# Patient Record
Sex: Male | Born: 1940
Health system: Southern US, Community
[De-identification: ages and names within clinical notes are randomized; demographics above are authoritative.]

## PROBLEM LIST (undated history)

## (undated) DIAGNOSIS — Z96659 Presence of unspecified artificial knee joint: Secondary | ICD-10-CM

## (undated) DIAGNOSIS — M1990 Unspecified osteoarthritis, unspecified site: Secondary | ICD-10-CM

## (undated) DIAGNOSIS — K5900 Constipation, unspecified: Secondary | ICD-10-CM

## (undated) DIAGNOSIS — A159 Respiratory tuberculosis unspecified: Secondary | ICD-10-CM

## (undated) DIAGNOSIS — S0990XA Unspecified injury of head, initial encounter: Secondary | ICD-10-CM

## (undated) DIAGNOSIS — N400 Enlarged prostate without lower urinary tract symptoms: Secondary | ICD-10-CM

## (undated) DIAGNOSIS — G709 Myoneural disorder, unspecified: Secondary | ICD-10-CM

## (undated) DIAGNOSIS — E119 Type 2 diabetes mellitus without complications: Secondary | ICD-10-CM

## (undated) DIAGNOSIS — R011 Cardiac murmur, unspecified: Secondary | ICD-10-CM

## (undated) DIAGNOSIS — A4901 Methicillin susceptible Staphylococcus aureus infection, unspecified site: Secondary | ICD-10-CM

## (undated) DIAGNOSIS — I10 Essential (primary) hypertension: Secondary | ICD-10-CM

## (undated) DIAGNOSIS — T8149XA Infection following a procedure, other surgical site, initial encounter: Secondary | ICD-10-CM

## (undated) DIAGNOSIS — T8459XA Infection and inflammatory reaction due to other internal joint prosthesis, initial encounter: Secondary | ICD-10-CM

## (undated) DIAGNOSIS — K5903 Drug induced constipation: Secondary | ICD-10-CM

## (undated) DIAGNOSIS — K219 Gastro-esophageal reflux disease without esophagitis: Secondary | ICD-10-CM

## (undated) DIAGNOSIS — E039 Hypothyroidism, unspecified: Secondary | ICD-10-CM

## (undated) DIAGNOSIS — S069X9A Unspecified intracranial injury with loss of consciousness of unspecified duration, initial encounter: Secondary | ICD-10-CM

## (undated) DIAGNOSIS — T402X5A Adverse effect of other opioids, initial encounter: Secondary | ICD-10-CM

## (undated) HISTORY — PX: CATARACT EXTRACTION: SUR2

## (undated) HISTORY — PX: REFRACTIVE SURGERY: SHX103

## (undated) HISTORY — DX: Infection following a procedure, other surgical site, initial encounter: T81.49XA

## (undated) HISTORY — PX: COLONOSCOPY: SHX174

## (undated) HISTORY — DX: Methicillin susceptible Staphylococcus aureus infection, unspecified site: A49.01

## (undated) HISTORY — PX: CARPAL TUNNEL RELEASE: SHX101

---

## 1982-11-20 HISTORY — PX: OTHER SURGICAL HISTORY: SHX169

## 2007-11-21 HISTORY — PX: CARPAL TUNNEL RELEASE: SHX101

## 2009-09-20 HISTORY — PX: PARTIAL KNEE ARTHROPLASTY: SHX2174

## 2010-10-20 HISTORY — PX: BACK SURGERY: SHX140

## 2011-11-24 DIAGNOSIS — E119 Type 2 diabetes mellitus without complications: Secondary | ICD-10-CM | POA: Diagnosis not present

## 2011-11-24 DIAGNOSIS — E109 Type 1 diabetes mellitus without complications: Secondary | ICD-10-CM | POA: Diagnosis not present

## 2011-11-24 DIAGNOSIS — I10 Essential (primary) hypertension: Secondary | ICD-10-CM | POA: Diagnosis not present

## 2011-11-24 DIAGNOSIS — E039 Hypothyroidism, unspecified: Secondary | ICD-10-CM | POA: Diagnosis not present

## 2011-11-24 DIAGNOSIS — E7800 Pure hypercholesterolemia, unspecified: Secondary | ICD-10-CM | POA: Diagnosis not present

## 2011-11-24 DIAGNOSIS — R7611 Nonspecific reaction to tuberculin skin test without active tuberculosis: Secondary | ICD-10-CM | POA: Diagnosis not present

## 2011-11-24 DIAGNOSIS — Z125 Encounter for screening for malignant neoplasm of prostate: Secondary | ICD-10-CM | POA: Diagnosis not present

## 2012-01-09 DIAGNOSIS — M5137 Other intervertebral disc degeneration, lumbosacral region: Secondary | ICD-10-CM | POA: Diagnosis not present

## 2012-01-09 DIAGNOSIS — M48061 Spinal stenosis, lumbar region without neurogenic claudication: Secondary | ICD-10-CM | POA: Diagnosis not present

## 2012-01-17 DIAGNOSIS — M48061 Spinal stenosis, lumbar region without neurogenic claudication: Secondary | ICD-10-CM | POA: Diagnosis not present

## 2012-01-17 DIAGNOSIS — M5137 Other intervertebral disc degeneration, lumbosacral region: Secondary | ICD-10-CM | POA: Diagnosis not present

## 2012-03-11 DIAGNOSIS — M48061 Spinal stenosis, lumbar region without neurogenic claudication: Secondary | ICD-10-CM | POA: Diagnosis not present

## 2012-03-11 DIAGNOSIS — M5137 Other intervertebral disc degeneration, lumbosacral region: Secondary | ICD-10-CM | POA: Diagnosis not present

## 2012-05-22 DIAGNOSIS — E039 Hypothyroidism, unspecified: Secondary | ICD-10-CM | POA: Diagnosis not present

## 2012-05-22 DIAGNOSIS — E782 Mixed hyperlipidemia: Secondary | ICD-10-CM | POA: Diagnosis not present

## 2012-05-22 DIAGNOSIS — I10 Essential (primary) hypertension: Secondary | ICD-10-CM | POA: Diagnosis not present

## 2012-06-13 DIAGNOSIS — Z049 Encounter for examination and observation for unspecified reason: Secondary | ICD-10-CM | POA: Diagnosis not present

## 2012-06-17 DIAGNOSIS — M159 Polyosteoarthritis, unspecified: Secondary | ICD-10-CM | POA: Diagnosis not present

## 2012-06-17 DIAGNOSIS — G619 Inflammatory polyneuropathy, unspecified: Secondary | ICD-10-CM | POA: Diagnosis not present

## 2012-06-17 DIAGNOSIS — G609 Hereditary and idiopathic neuropathy, unspecified: Secondary | ICD-10-CM | POA: Diagnosis not present

## 2012-06-17 DIAGNOSIS — Z79899 Other long term (current) drug therapy: Secondary | ICD-10-CM | POA: Diagnosis not present

## 2012-07-16 DIAGNOSIS — M545 Low back pain: Secondary | ICD-10-CM | POA: Diagnosis not present

## 2012-07-16 DIAGNOSIS — M19049 Primary osteoarthritis, unspecified hand: Secondary | ICD-10-CM | POA: Diagnosis not present

## 2012-07-16 DIAGNOSIS — M5137 Other intervertebral disc degeneration, lumbosacral region: Secondary | ICD-10-CM | POA: Diagnosis not present

## 2012-07-31 DIAGNOSIS — M48061 Spinal stenosis, lumbar region without neurogenic claudication: Secondary | ICD-10-CM | POA: Diagnosis not present

## 2012-10-15 DIAGNOSIS — G619 Inflammatory polyneuropathy, unspecified: Secondary | ICD-10-CM | POA: Diagnosis not present

## 2012-10-15 DIAGNOSIS — M1990 Unspecified osteoarthritis, unspecified site: Secondary | ICD-10-CM | POA: Diagnosis not present

## 2012-10-15 DIAGNOSIS — R5381 Other malaise: Secondary | ICD-10-CM | POA: Diagnosis not present

## 2012-10-15 DIAGNOSIS — E782 Mixed hyperlipidemia: Secondary | ICD-10-CM | POA: Diagnosis not present

## 2012-10-15 DIAGNOSIS — M159 Polyosteoarthritis, unspecified: Secondary | ICD-10-CM | POA: Diagnosis not present

## 2012-10-15 DIAGNOSIS — G622 Polyneuropathy due to other toxic agents: Secondary | ICD-10-CM | POA: Diagnosis not present

## 2012-10-15 DIAGNOSIS — I10 Essential (primary) hypertension: Secondary | ICD-10-CM | POA: Diagnosis not present

## 2012-10-23 DIAGNOSIS — Z79899 Other long term (current) drug therapy: Secondary | ICD-10-CM | POA: Diagnosis not present

## 2012-11-26 DIAGNOSIS — N320 Bladder-neck obstruction: Secondary | ICD-10-CM | POA: Diagnosis not present

## 2013-02-14 DIAGNOSIS — I10 Essential (primary) hypertension: Secondary | ICD-10-CM | POA: Diagnosis not present

## 2013-02-14 DIAGNOSIS — E782 Mixed hyperlipidemia: Secondary | ICD-10-CM | POA: Diagnosis not present

## 2013-02-18 DIAGNOSIS — I10 Essential (primary) hypertension: Secondary | ICD-10-CM | POA: Diagnosis not present

## 2013-02-18 DIAGNOSIS — E039 Hypothyroidism, unspecified: Secondary | ICD-10-CM | POA: Diagnosis not present

## 2013-02-18 DIAGNOSIS — I739 Peripheral vascular disease, unspecified: Secondary | ICD-10-CM | POA: Diagnosis not present

## 2013-03-10 DIAGNOSIS — H47329 Drusen of optic disc, unspecified eye: Secondary | ICD-10-CM | POA: Diagnosis not present

## 2013-03-10 DIAGNOSIS — H2510 Age-related nuclear cataract, unspecified eye: Secondary | ICD-10-CM | POA: Diagnosis not present

## 2013-03-11 DIAGNOSIS — R351 Nocturia: Secondary | ICD-10-CM | POA: Diagnosis not present

## 2013-03-11 DIAGNOSIS — N400 Enlarged prostate without lower urinary tract symptoms: Secondary | ICD-10-CM | POA: Diagnosis not present

## 2013-03-11 DIAGNOSIS — N529 Male erectile dysfunction, unspecified: Secondary | ICD-10-CM | POA: Diagnosis not present

## 2013-04-22 DIAGNOSIS — G589 Mononeuropathy, unspecified: Secondary | ICD-10-CM | POA: Diagnosis not present

## 2013-04-22 DIAGNOSIS — M19049 Primary osteoarthritis, unspecified hand: Secondary | ICD-10-CM | POA: Diagnosis not present

## 2013-04-22 DIAGNOSIS — G5600 Carpal tunnel syndrome, unspecified upper limb: Secondary | ICD-10-CM | POA: Diagnosis not present

## 2013-05-12 DIAGNOSIS — G5600 Carpal tunnel syndrome, unspecified upper limb: Secondary | ICD-10-CM | POA: Diagnosis not present

## 2013-05-20 DIAGNOSIS — E1142 Type 2 diabetes mellitus with diabetic polyneuropathy: Secondary | ICD-10-CM | POA: Diagnosis not present

## 2013-05-20 DIAGNOSIS — G5600 Carpal tunnel syndrome, unspecified upper limb: Secondary | ICD-10-CM | POA: Diagnosis not present

## 2013-05-20 DIAGNOSIS — M5412 Radiculopathy, cervical region: Secondary | ICD-10-CM | POA: Diagnosis not present

## 2013-05-20 DIAGNOSIS — E782 Mixed hyperlipidemia: Secondary | ICD-10-CM | POA: Diagnosis not present

## 2013-05-20 DIAGNOSIS — IMO0001 Reserved for inherently not codable concepts without codable children: Secondary | ICD-10-CM | POA: Diagnosis not present

## 2013-05-21 DIAGNOSIS — E782 Mixed hyperlipidemia: Secondary | ICD-10-CM | POA: Diagnosis not present

## 2013-05-21 DIAGNOSIS — E039 Hypothyroidism, unspecified: Secondary | ICD-10-CM | POA: Diagnosis not present

## 2013-05-21 DIAGNOSIS — I10 Essential (primary) hypertension: Secondary | ICD-10-CM | POA: Diagnosis not present

## 2013-06-30 DIAGNOSIS — J019 Acute sinusitis, unspecified: Secondary | ICD-10-CM | POA: Diagnosis not present

## 2013-06-30 DIAGNOSIS — G5600 Carpal tunnel syndrome, unspecified upper limb: Secondary | ICD-10-CM | POA: Diagnosis not present

## 2013-08-21 DIAGNOSIS — Z01818 Encounter for other preprocedural examination: Secondary | ICD-10-CM | POA: Diagnosis not present

## 2013-08-22 DIAGNOSIS — G5600 Carpal tunnel syndrome, unspecified upper limb: Secondary | ICD-10-CM | POA: Diagnosis not present

## 2013-09-09 DIAGNOSIS — R5381 Other malaise: Secondary | ICD-10-CM | POA: Diagnosis not present

## 2013-09-09 DIAGNOSIS — N529 Male erectile dysfunction, unspecified: Secondary | ICD-10-CM | POA: Diagnosis not present

## 2013-09-09 DIAGNOSIS — E782 Mixed hyperlipidemia: Secondary | ICD-10-CM | POA: Diagnosis not present

## 2013-09-09 DIAGNOSIS — E039 Hypothyroidism, unspecified: Secondary | ICD-10-CM | POA: Diagnosis not present

## 2013-09-09 DIAGNOSIS — E559 Vitamin D deficiency, unspecified: Secondary | ICD-10-CM | POA: Diagnosis not present

## 2013-09-09 DIAGNOSIS — I10 Essential (primary) hypertension: Secondary | ICD-10-CM | POA: Diagnosis not present

## 2013-09-10 DIAGNOSIS — N453 Epididymo-orchitis: Secondary | ICD-10-CM | POA: Diagnosis not present

## 2013-09-10 DIAGNOSIS — E119 Type 2 diabetes mellitus without complications: Secondary | ICD-10-CM | POA: Diagnosis not present

## 2013-09-10 DIAGNOSIS — N320 Bladder-neck obstruction: Secondary | ICD-10-CM | POA: Diagnosis not present

## 2013-09-10 DIAGNOSIS — N433 Hydrocele, unspecified: Secondary | ICD-10-CM | POA: Diagnosis not present

## 2013-10-01 DIAGNOSIS — E039 Hypothyroidism, unspecified: Secondary | ICD-10-CM | POA: Diagnosis not present

## 2013-10-06 DIAGNOSIS — J3481 Nasal mucositis (ulcerative): Secondary | ICD-10-CM | POA: Diagnosis not present

## 2013-11-06 DIAGNOSIS — M1990 Unspecified osteoarthritis, unspecified site: Secondary | ICD-10-CM | POA: Diagnosis not present

## 2013-11-06 DIAGNOSIS — G5600 Carpal tunnel syndrome, unspecified upper limb: Secondary | ICD-10-CM | POA: Diagnosis not present

## 2013-11-06 DIAGNOSIS — G589 Mononeuropathy, unspecified: Secondary | ICD-10-CM | POA: Diagnosis not present

## 2014-02-19 DIAGNOSIS — L570 Actinic keratosis: Secondary | ICD-10-CM | POA: Diagnosis not present

## 2014-03-11 DIAGNOSIS — N320 Bladder-neck obstruction: Secondary | ICD-10-CM | POA: Diagnosis not present

## 2014-03-19 DIAGNOSIS — M5137 Other intervertebral disc degeneration, lumbosacral region: Secondary | ICD-10-CM | POA: Diagnosis not present

## 2014-03-24 DIAGNOSIS — E559 Vitamin D deficiency, unspecified: Secondary | ICD-10-CM | POA: Diagnosis not present

## 2014-03-24 DIAGNOSIS — E1065 Type 1 diabetes mellitus with hyperglycemia: Secondary | ICD-10-CM | POA: Diagnosis not present

## 2014-03-24 DIAGNOSIS — R5381 Other malaise: Secondary | ICD-10-CM | POA: Diagnosis not present

## 2014-03-24 DIAGNOSIS — R5383 Other fatigue: Secondary | ICD-10-CM | POA: Diagnosis not present

## 2014-03-24 DIAGNOSIS — IMO0002 Reserved for concepts with insufficient information to code with codable children: Secondary | ICD-10-CM | POA: Diagnosis not present

## 2014-03-24 DIAGNOSIS — M5137 Other intervertebral disc degeneration, lumbosacral region: Secondary | ICD-10-CM | POA: Diagnosis not present

## 2014-03-30 DIAGNOSIS — M6281 Muscle weakness (generalized): Secondary | ICD-10-CM | POA: Diagnosis not present

## 2014-03-30 DIAGNOSIS — M5137 Other intervertebral disc degeneration, lumbosacral region: Secondary | ICD-10-CM | POA: Diagnosis not present

## 2014-03-31 DIAGNOSIS — R5381 Other malaise: Secondary | ICD-10-CM | POA: Diagnosis not present

## 2014-03-31 DIAGNOSIS — I10 Essential (primary) hypertension: Secondary | ICD-10-CM | POA: Diagnosis not present

## 2014-03-31 DIAGNOSIS — E782 Mixed hyperlipidemia: Secondary | ICD-10-CM | POA: Diagnosis not present

## 2014-03-31 DIAGNOSIS — E1065 Type 1 diabetes mellitus with hyperglycemia: Secondary | ICD-10-CM | POA: Diagnosis not present

## 2014-03-31 DIAGNOSIS — I739 Peripheral vascular disease, unspecified: Secondary | ICD-10-CM | POA: Diagnosis not present

## 2014-03-31 DIAGNOSIS — R5383 Other fatigue: Secondary | ICD-10-CM | POA: Diagnosis not present

## 2014-03-31 DIAGNOSIS — IMO0002 Reserved for concepts with insufficient information to code with codable children: Secondary | ICD-10-CM | POA: Diagnosis not present

## 2014-04-01 DIAGNOSIS — M5137 Other intervertebral disc degeneration, lumbosacral region: Secondary | ICD-10-CM | POA: Diagnosis not present

## 2014-04-01 DIAGNOSIS — M6281 Muscle weakness (generalized): Secondary | ICD-10-CM | POA: Diagnosis not present

## 2014-04-06 DIAGNOSIS — M5137 Other intervertebral disc degeneration, lumbosacral region: Secondary | ICD-10-CM | POA: Diagnosis not present

## 2014-04-06 DIAGNOSIS — M6281 Muscle weakness (generalized): Secondary | ICD-10-CM | POA: Diagnosis not present

## 2014-04-08 DIAGNOSIS — M5137 Other intervertebral disc degeneration, lumbosacral region: Secondary | ICD-10-CM | POA: Diagnosis not present

## 2014-04-08 DIAGNOSIS — M6281 Muscle weakness (generalized): Secondary | ICD-10-CM | POA: Diagnosis not present

## 2014-04-15 DIAGNOSIS — H2510 Age-related nuclear cataract, unspecified eye: Secondary | ICD-10-CM | POA: Diagnosis not present

## 2014-04-15 DIAGNOSIS — H47329 Drusen of optic disc, unspecified eye: Secondary | ICD-10-CM | POA: Diagnosis not present

## 2014-04-15 DIAGNOSIS — M6281 Muscle weakness (generalized): Secondary | ICD-10-CM | POA: Diagnosis not present

## 2014-04-15 DIAGNOSIS — M5137 Other intervertebral disc degeneration, lumbosacral region: Secondary | ICD-10-CM | POA: Diagnosis not present

## 2014-04-16 DIAGNOSIS — M5137 Other intervertebral disc degeneration, lumbosacral region: Secondary | ICD-10-CM | POA: Diagnosis not present

## 2014-04-16 DIAGNOSIS — M6281 Muscle weakness (generalized): Secondary | ICD-10-CM | POA: Diagnosis not present

## 2014-04-20 DIAGNOSIS — M5137 Other intervertebral disc degeneration, lumbosacral region: Secondary | ICD-10-CM | POA: Diagnosis not present

## 2014-04-20 DIAGNOSIS — M6281 Muscle weakness (generalized): Secondary | ICD-10-CM | POA: Diagnosis not present

## 2014-04-22 DIAGNOSIS — N320 Bladder-neck obstruction: Secondary | ICD-10-CM | POA: Diagnosis not present

## 2014-04-22 DIAGNOSIS — N400 Enlarged prostate without lower urinary tract symptoms: Secondary | ICD-10-CM | POA: Diagnosis not present

## 2014-04-22 DIAGNOSIS — E119 Type 2 diabetes mellitus without complications: Secondary | ICD-10-CM | POA: Diagnosis not present

## 2014-04-22 DIAGNOSIS — N433 Hydrocele, unspecified: Secondary | ICD-10-CM | POA: Diagnosis not present

## 2014-04-22 DIAGNOSIS — M5137 Other intervertebral disc degeneration, lumbosacral region: Secondary | ICD-10-CM | POA: Diagnosis not present

## 2014-04-22 DIAGNOSIS — M6281 Muscle weakness (generalized): Secondary | ICD-10-CM | POA: Diagnosis not present

## 2014-05-06 DIAGNOSIS — I10 Essential (primary) hypertension: Secondary | ICD-10-CM | POA: Diagnosis not present

## 2014-05-06 DIAGNOSIS — G589 Mononeuropathy, unspecified: Secondary | ICD-10-CM | POA: Diagnosis not present

## 2014-05-06 DIAGNOSIS — E039 Hypothyroidism, unspecified: Secondary | ICD-10-CM | POA: Diagnosis not present

## 2014-05-06 DIAGNOSIS — IMO0001 Reserved for inherently not codable concepts without codable children: Secondary | ICD-10-CM | POA: Diagnosis not present

## 2014-05-06 DIAGNOSIS — G5600 Carpal tunnel syndrome, unspecified upper limb: Secondary | ICD-10-CM | POA: Diagnosis not present

## 2014-05-06 DIAGNOSIS — M1990 Unspecified osteoarthritis, unspecified site: Secondary | ICD-10-CM | POA: Diagnosis not present

## 2014-05-06 DIAGNOSIS — E782 Mixed hyperlipidemia: Secondary | ICD-10-CM | POA: Diagnosis not present

## 2014-05-07 DIAGNOSIS — M1710 Unilateral primary osteoarthritis, unspecified knee: Secondary | ICD-10-CM | POA: Diagnosis not present

## 2014-07-22 DIAGNOSIS — N529 Male erectile dysfunction, unspecified: Secondary | ICD-10-CM | POA: Diagnosis not present

## 2014-07-22 DIAGNOSIS — I10 Essential (primary) hypertension: Secondary | ICD-10-CM | POA: Diagnosis not present

## 2014-07-22 DIAGNOSIS — E162 Hypoglycemia, unspecified: Secondary | ICD-10-CM | POA: Diagnosis not present

## 2014-07-22 DIAGNOSIS — IMO0001 Reserved for inherently not codable concepts without codable children: Secondary | ICD-10-CM | POA: Diagnosis not present

## 2014-07-22 DIAGNOSIS — E039 Hypothyroidism, unspecified: Secondary | ICD-10-CM | POA: Diagnosis not present

## 2014-07-22 DIAGNOSIS — E559 Vitamin D deficiency, unspecified: Secondary | ICD-10-CM | POA: Diagnosis not present

## 2014-07-22 DIAGNOSIS — E782 Mixed hyperlipidemia: Secondary | ICD-10-CM | POA: Diagnosis not present

## 2014-11-24 DIAGNOSIS — E780 Pure hypercholesterolemia: Secondary | ICD-10-CM | POA: Diagnosis not present

## 2014-11-24 DIAGNOSIS — J342 Deviated nasal septum: Secondary | ICD-10-CM | POA: Diagnosis not present

## 2014-11-24 DIAGNOSIS — N401 Enlarged prostate with lower urinary tract symptoms: Secondary | ICD-10-CM | POA: Diagnosis not present

## 2014-11-24 DIAGNOSIS — E1140 Type 2 diabetes mellitus with diabetic neuropathy, unspecified: Secondary | ICD-10-CM | POA: Diagnosis not present

## 2014-11-24 DIAGNOSIS — Z79899 Other long term (current) drug therapy: Secondary | ICD-10-CM | POA: Diagnosis not present

## 2014-11-24 DIAGNOSIS — M159 Polyosteoarthritis, unspecified: Secondary | ICD-10-CM | POA: Diagnosis not present

## 2014-11-24 DIAGNOSIS — E039 Hypothyroidism, unspecified: Secondary | ICD-10-CM | POA: Diagnosis not present

## 2014-11-24 DIAGNOSIS — R609 Edema, unspecified: Secondary | ICD-10-CM | POA: Diagnosis not present

## 2014-12-23 DIAGNOSIS — M5416 Radiculopathy, lumbar region: Secondary | ICD-10-CM | POA: Diagnosis not present

## 2014-12-23 DIAGNOSIS — M179 Osteoarthritis of knee, unspecified: Secondary | ICD-10-CM | POA: Diagnosis not present

## 2014-12-23 DIAGNOSIS — Z9181 History of falling: Secondary | ICD-10-CM | POA: Diagnosis not present

## 2014-12-23 DIAGNOSIS — Z1389 Encounter for screening for other disorder: Secondary | ICD-10-CM | POA: Diagnosis not present

## 2015-01-11 DIAGNOSIS — H2513 Age-related nuclear cataract, bilateral: Secondary | ICD-10-CM | POA: Diagnosis not present

## 2015-01-11 DIAGNOSIS — E119 Type 2 diabetes mellitus without complications: Secondary | ICD-10-CM | POA: Diagnosis not present

## 2015-01-11 DIAGNOSIS — H47323 Drusen of optic disc, bilateral: Secondary | ICD-10-CM | POA: Diagnosis not present

## 2015-01-13 DIAGNOSIS — M5416 Radiculopathy, lumbar region: Secondary | ICD-10-CM | POA: Diagnosis not present

## 2015-01-13 DIAGNOSIS — Z6828 Body mass index (BMI) 28.0-28.9, adult: Secondary | ICD-10-CM | POA: Diagnosis not present

## 2015-01-15 DIAGNOSIS — M5416 Radiculopathy, lumbar region: Secondary | ICD-10-CM | POA: Diagnosis not present

## 2015-02-17 DIAGNOSIS — M5126 Other intervertebral disc displacement, lumbar region: Secondary | ICD-10-CM | POA: Diagnosis not present

## 2015-02-17 DIAGNOSIS — M4807 Spinal stenosis, lumbosacral region: Secondary | ICD-10-CM | POA: Diagnosis not present

## 2015-02-17 DIAGNOSIS — M4806 Spinal stenosis, lumbar region: Secondary | ICD-10-CM | POA: Diagnosis not present

## 2015-02-17 DIAGNOSIS — M5127 Other intervertebral disc displacement, lumbosacral region: Secondary | ICD-10-CM | POA: Diagnosis not present

## 2015-02-17 DIAGNOSIS — M5416 Radiculopathy, lumbar region: Secondary | ICD-10-CM | POA: Diagnosis not present

## 2015-02-23 DIAGNOSIS — Z6829 Body mass index (BMI) 29.0-29.9, adult: Secondary | ICD-10-CM | POA: Diagnosis not present

## 2015-02-23 DIAGNOSIS — E871 Hypo-osmolality and hyponatremia: Secondary | ICD-10-CM | POA: Diagnosis not present

## 2015-02-23 DIAGNOSIS — E1140 Type 2 diabetes mellitus with diabetic neuropathy, unspecified: Secondary | ICD-10-CM | POA: Diagnosis not present

## 2015-02-23 DIAGNOSIS — E780 Pure hypercholesterolemia: Secondary | ICD-10-CM | POA: Diagnosis not present

## 2015-02-23 DIAGNOSIS — M159 Polyosteoarthritis, unspecified: Secondary | ICD-10-CM | POA: Diagnosis not present

## 2015-03-03 DIAGNOSIS — M5416 Radiculopathy, lumbar region: Secondary | ICD-10-CM | POA: Diagnosis not present

## 2015-03-15 DIAGNOSIS — E1165 Type 2 diabetes mellitus with hyperglycemia: Secondary | ICD-10-CM | POA: Diagnosis not present

## 2015-03-15 DIAGNOSIS — M4806 Spinal stenosis, lumbar region: Secondary | ICD-10-CM | POA: Diagnosis not present

## 2015-03-15 DIAGNOSIS — M5416 Radiculopathy, lumbar region: Secondary | ICD-10-CM | POA: Diagnosis not present

## 2015-03-16 DIAGNOSIS — M4806 Spinal stenosis, lumbar region: Secondary | ICD-10-CM | POA: Diagnosis not present

## 2015-03-16 DIAGNOSIS — M5416 Radiculopathy, lumbar region: Secondary | ICD-10-CM | POA: Diagnosis not present

## 2015-03-25 DIAGNOSIS — E871 Hypo-osmolality and hyponatremia: Secondary | ICD-10-CM | POA: Diagnosis not present

## 2015-03-25 DIAGNOSIS — Z125 Encounter for screening for malignant neoplasm of prostate: Secondary | ICD-10-CM | POA: Diagnosis not present

## 2015-03-25 DIAGNOSIS — E1140 Type 2 diabetes mellitus with diabetic neuropathy, unspecified: Secondary | ICD-10-CM | POA: Diagnosis not present

## 2015-03-25 DIAGNOSIS — R609 Edema, unspecified: Secondary | ICD-10-CM | POA: Diagnosis not present

## 2015-03-25 DIAGNOSIS — E780 Pure hypercholesterolemia: Secondary | ICD-10-CM | POA: Diagnosis not present

## 2015-04-12 ENCOUNTER — Other Ambulatory Visit: Payer: Self-pay | Admitting: Neurosurgery

## 2015-04-12 DIAGNOSIS — M4806 Spinal stenosis, lumbar region: Secondary | ICD-10-CM | POA: Diagnosis not present

## 2015-04-12 DIAGNOSIS — Z6829 Body mass index (BMI) 29.0-29.9, adult: Secondary | ICD-10-CM | POA: Diagnosis not present

## 2015-05-17 ENCOUNTER — Encounter (HOSPITAL_COMMUNITY): Payer: Self-pay

## 2015-05-17 ENCOUNTER — Encounter (HOSPITAL_COMMUNITY)
Admission: RE | Admit: 2015-05-17 | Discharge: 2015-05-17 | Disposition: A | Discharge status: Home / Self Care | Payer: Medicare Other | Source: Ambulatory Visit | Attending: Neurosurgery | Admitting: Neurosurgery

## 2015-05-17 DIAGNOSIS — E119 Type 2 diabetes mellitus without complications: Secondary | ICD-10-CM | POA: Diagnosis not present

## 2015-05-17 DIAGNOSIS — M1990 Unspecified osteoarthritis, unspecified site: Secondary | ICD-10-CM | POA: Diagnosis not present

## 2015-05-17 DIAGNOSIS — M4806 Spinal stenosis, lumbar region: Secondary | ICD-10-CM | POA: Diagnosis not present

## 2015-05-17 DIAGNOSIS — E039 Hypothyroidism, unspecified: Secondary | ICD-10-CM | POA: Diagnosis not present

## 2015-05-17 HISTORY — DX: Unspecified osteoarthritis, unspecified site: M19.90

## 2015-05-17 HISTORY — DX: Hypothyroidism, unspecified: E03.9

## 2015-05-17 HISTORY — DX: Type 2 diabetes mellitus without complications: E11.9

## 2015-05-17 LAB — BASIC METABOLIC PANEL
Anion gap: 7 (ref 5–15)
BUN: 12 mg/dL (ref 6–20)
CHLORIDE: 92 mmol/L — AB (ref 101–111)
CO2: 31 mmol/L (ref 22–32)
Calcium: 9.3 mg/dL (ref 8.9–10.3)
Creatinine, Ser: 0.99 mg/dL (ref 0.61–1.24)
GFR calc Af Amer: >60 mL/min (ref >60)
GFR calc non Af Amer: >60 mL/min (ref >60)
GLUCOSE: 343 mg/dL — AB (ref 65–99)
POTASSIUM: 4.3 mmol/L (ref 3.5–5.1)
Sodium: 130 mmol/L — ABNORMAL LOW (ref 135–145)

## 2015-05-17 LAB — SURGICAL PCR SCREEN
MRSA, PCR: NEGATIVE
Staphylococcus aureus: POSITIVE — AB

## 2015-05-17 LAB — TYPE AND SCREEN
ABO/RH(D): B POS
ANTIBODY SCREEN: NEGATIVE

## 2015-05-17 LAB — CBC
HCT: 41.8 % (ref 39.0–52.0)
Hemoglobin: 14.1 g/dL (ref 13.0–17.0)
MCH: 30.4 pg (ref 26.0–34.0)
MCHC: 33.7 g/dL (ref 30.0–36.0)
MCV: 90.1 fL (ref 78.0–100.0)
PLATELETS: 237 10*3/uL (ref 150–400)
RBC: 4.64 MIL/uL (ref 4.22–5.81)
RDW: 13.5 % (ref 11.5–15.5)
WBC: 8 10*3/uL (ref 4.0–10.5)

## 2015-05-17 LAB — GLUCOSE, CAPILLARY: Glucose-Capillary: 356 mg/dL — ABNORMAL HIGH (ref 65–99)

## 2015-05-17 LAB — ABO/RH: ABO/RH(D): B POS

## 2015-05-17 NOTE — Progress Notes (Addendum)
Anesthesia  Chart Review:  Patient is a 74 year old male posted for PLIF L2-L3, extension of instrumentation, removal of solera (medtronic) screws on 05/20/15 by Dr. Gerlene FeeKritzer.  History includes former smoker, DM2 on insulin, hypothyroidism, arthritis, bilateral partial knee arthroplasty '10, back surgery '11. PCP is Lonie PeakNathan Conroy, PA-C with Dr. Burnell BlanksMaura Hamrick.   Meds include alfuzosin, ASA, diclofenac, Proscar, Novolog Flexpen TID with meals, levothyroxine, lisinopril-HCTZ, Magnesium, potassium, Lyrica, Zocor.  05/17/15 EKG: SB at 57 bpm, cannot rule out anterior infarct (age undetermined).  There are no comparison EKGs available in Muse or Epic.  Preoperative labs noted. CBG on arrival was 356. Patient reported his fasting glucose this morning was 125.  He had cereal and coffee and took 8 Units of Novolog and 43 Units of Lantus this morning.  Follow-up serum labs showed Na 130, K 4.3, Cr 0.99, glucose 343, CBC and anion gap WNL. He reported his fasting glucose levels are typically in the 100's. Last night he took his insulin later (ate lasagna around 10 PM). At 1 AM he had a hypoglycemic event with CBG of 38 which he treated with results up to 89. Glucose before breakfast this morning was 125. He reports his A1C last month was "7.9."  Records requested from his PCP. I had his PAT RN instruct him to take Novolog with meals as usual the day before surgery but no Novolog at bedtime the evening before surgery. He can take 50% of his morning Lantus dose on the morning of surgery, but to call Holding first if his glucose was < 70. Also I advised that he check his glucose at bedtime before surgery as he may require a bedtime snack based on his reading and intake that day.  Additional follow-up once I receive PCP records including his last A1C and comparison EKG as available.  Velna Ochsllison , PA-C [facility]Anesthesiology Phone [phone] 05/17/2015 3:14 PM  Addendum: Records received from his  PCP. No comparison EKG received. His A1C on 02/23/15 was 8.2 consistent with eAG of 189. I did notify Erie NoeVanessa at Dr. Felipe DroneKrtizer's office of patient's elevated glucose readings from PAT.  Hopefully he will present with a reasonable fasting glucose tomorrow since his eAG and reported typical fasting glucose run  < 200. Would anticipate that he could proceed as planned unless his glucose in significantly elevated on the day of surgery.   Velna Ochsllison , PA-C [facility] Phone [phone] 05/19/2015 9:34 AM

## 2015-05-17 NOTE — Pre-Procedure Instructions (Signed)
    David Mathews  05/17/2015     No Pharmacies Listed   Your procedure is scheduled on Thursday, June 30  Report [facility] "A" at 10:00 A.M.  Call this number if you have problems the morning of surgery:  [phone]               Any question prior to surgery call [phone] (Monday-Friday 8am-4pm)   Remember:  Do not eat food or drink liquids after midnight.  Take these medicines the morning of surgery with A SIP OF WATER tylenol if needed, uroxatral,proscar, synthroid,             Check blood sugar when you wake up. If blood sugar is less than 70 call [phone] and ask for charge nurse.If greater then 70,  Take 1/2 dose of lantus the morning of surgery. Do not take the novolog the morning of surgery.   Do not wear jewelry, make-up or nail polish.  Do not wear lotions, powders, or perfumes.  You may wear deodorant.  Do not shave 48 hours prior to surgery.  Men may shave face and neck.  Do not bring valuables to the hospital.  [facility] is not responsible for any belongings or valuables.  Contacts, dentures or bridgework may not be worn into surgery.  Leave your suitcase in the car.  After surgery it may be brought to your room.  For patients admitted to the hospital, discharge time will be determined by your treatment team.  Patients discharged the day of surgery will not be allowed to drive home.   Name and phone number of your driver:    Special instructions: review preparing for surgery handout  Please read over the following fact sheets that you were given. Pain Booklet, Coughing and Deep Breathing, Blood Transfusion Information, MRSA Information and Surgical Site Infection Prevention

## 2015-05-17 NOTE — Progress Notes (Addendum)
No cardiologist, Pcp:Dr. Hammerick at [facility] in [place]  CBG: 365, allison notifoed.  Pt. Stated this am sugar was 125 at home, ate cereal for breakfast. States blood sugars run 100's in mornings. Stated last night ate dinner 2000, didn't check sugar took insulin and ate pasta,  stayed up late checked blood sugar at 0100 and cbg was 38. Ate M and M's  Blood sugar up to 89.Pt. Stated he went to sleep. Will request hgb A1c from pcp.  Called in mupirocin ointment [facility] [place] [facility]

## 2015-05-19 MED ORDER — CEFAZOLIN SODIUM-DEXTROSE 2-3 GM-% IV SOLR
2.0000 g | INTRAVENOUS | Status: AC
  Administered 2015-05-20: 2 g via INTRAVENOUS
  Filled 2015-05-19: qty 50

## 2015-05-19 MED ORDER — DEXAMETHASONE SODIUM PHOSPHATE 10 MG/ML IJ SOLN
10.0000 mg | INTRAMUSCULAR | Status: DC
  Filled 2015-05-19: qty 1

## 2015-05-20 ENCOUNTER — Inpatient Hospital Stay (HOSPITAL_COMMUNITY): Payer: Medicare Other | Admitting: Anesthesiology

## 2015-05-20 ENCOUNTER — Inpatient Hospital Stay (HOSPITAL_COMMUNITY): Payer: Medicare Other | Admitting: Vascular Surgery

## 2015-05-20 ENCOUNTER — Inpatient Hospital Stay (HOSPITAL_COMMUNITY): Payer: Medicare Other

## 2015-05-20 ENCOUNTER — Inpatient Hospital Stay (HOSPITAL_COMMUNITY)
Admission: RE | Admit: 2015-05-20 | Discharge: 2015-05-21 | DRG: 460 | Disposition: A | Discharge status: Home / Self Care | Payer: Medicare Other | Source: Ambulatory Visit | Attending: Neurosurgery | Admitting: Neurosurgery

## 2015-05-20 ENCOUNTER — Encounter (HOSPITAL_COMMUNITY): Payer: Self-pay | Admitting: *Deleted

## 2015-05-20 ENCOUNTER — Encounter (HOSPITAL_COMMUNITY)
Admission: RE | Disposition: A | Discharge status: Home / Self Care | Payer: Self-pay | Source: Ambulatory Visit | Attending: Neurosurgery

## 2015-05-20 DIAGNOSIS — Z7982 Long term (current) use of aspirin: Secondary | ICD-10-CM | POA: Diagnosis not present

## 2015-05-20 DIAGNOSIS — E119 Type 2 diabetes mellitus without complications: Secondary | ICD-10-CM | POA: Diagnosis not present

## 2015-05-20 DIAGNOSIS — Z87891 Personal history of nicotine dependence: Secondary | ICD-10-CM

## 2015-05-20 DIAGNOSIS — M549 Dorsalgia, unspecified: Secondary | ICD-10-CM | POA: Diagnosis not present

## 2015-05-20 DIAGNOSIS — Z791 Long term (current) use of non-steroidal anti-inflammatories (NSAID): Secondary | ICD-10-CM

## 2015-05-20 DIAGNOSIS — Z981 Arthrodesis status: Secondary | ICD-10-CM | POA: Diagnosis not present

## 2015-05-20 DIAGNOSIS — M48061 Spinal stenosis, lumbar region without neurogenic claudication: Secondary | ICD-10-CM | POA: Diagnosis present

## 2015-05-20 DIAGNOSIS — Z794 Long term (current) use of insulin: Secondary | ICD-10-CM

## 2015-05-20 DIAGNOSIS — Z79899 Other long term (current) drug therapy: Secondary | ICD-10-CM | POA: Diagnosis not present

## 2015-05-20 DIAGNOSIS — M4806 Spinal stenosis, lumbar region: Principal | ICD-10-CM | POA: Diagnosis present

## 2015-05-20 DIAGNOSIS — Z419 Encounter for procedure for purposes other than remedying health state, unspecified: Secondary | ICD-10-CM

## 2015-05-20 DIAGNOSIS — E039 Hypothyroidism, unspecified: Secondary | ICD-10-CM | POA: Diagnosis not present

## 2015-05-20 DIAGNOSIS — M1990 Unspecified osteoarthritis, unspecified site: Secondary | ICD-10-CM | POA: Diagnosis not present

## 2015-05-20 DIAGNOSIS — Z4889 Encounter for other specified surgical aftercare: Secondary | ICD-10-CM | POA: Diagnosis not present

## 2015-05-20 LAB — GLUCOSE, CAPILLARY
GLUCOSE-CAPILLARY: 142 mg/dL — AB (ref 65–99)
Glucose-Capillary: 143 mg/dL — ABNORMAL HIGH (ref 65–99)
Glucose-Capillary: 245 mg/dL — ABNORMAL HIGH (ref 65–99)

## 2015-05-20 SURGERY — POSTERIOR LUMBAR FUSION 1 WITH HARDWARE REMOVAL
Anesthesia: General | Site: Back

## 2015-05-20 MED ORDER — LISINOPRIL 20 MG PO TABS
20.0000 mg | ORAL_TABLET | Freq: Every day | ORAL | Status: DC
  Administered 2015-05-20 – 2015-05-21 (×2): 20 mg via ORAL
  Filled 2015-05-20 (×2): qty 1

## 2015-05-20 MED ORDER — PREGABALIN 75 MG PO CAPS
150.0000 mg | ORAL_CAPSULE | Freq: Two times a day (BID) | ORAL | Status: DC
  Administered 2015-05-20 – 2015-05-21 (×2): 150 mg via ORAL
  Filled 2015-05-20 (×2): qty 2

## 2015-05-20 MED ORDER — GLYCOPYRROLATE 0.2 MG/ML IJ SOLN
INTRAMUSCULAR | Status: DC | PRN
  Administered 2015-05-20: 0.4 mg via INTRAVENOUS

## 2015-05-20 MED ORDER — PROPOFOL 10 MG/ML IV BOLUS
INTRAVENOUS | Status: DC | PRN
  Administered 2015-05-20: 150 mg via INTRAVENOUS

## 2015-05-20 MED ORDER — GLYCOPYRROLATE 0.2 MG/ML IJ SOLN
INTRAMUSCULAR | Status: AC
  Filled 2015-05-20: qty 6

## 2015-05-20 MED ORDER — INSULIN GLARGINE 100 UNIT/ML ~~LOC~~ SOLN
45.0000 [IU] | Freq: Every day | SUBCUTANEOUS | Status: DC
  Administered 2015-05-21: 45 [IU] via SUBCUTANEOUS
  Filled 2015-05-20: qty 0.45

## 2015-05-20 MED ORDER — HYDROMORPHONE HCL 1 MG/ML IJ SOLN
0.2500 mg | INTRAMUSCULAR | Status: DC | PRN
  Administered 2015-05-20 (×4): 0.5 mg via INTRAVENOUS

## 2015-05-20 MED ORDER — PROPOFOL 10 MG/ML IV BOLUS
INTRAVENOUS | Status: AC
  Filled 2015-05-20: qty 20

## 2015-05-20 MED ORDER — MENTHOL 3 MG MT LOZG: 1.0000 | LOZENGE | OROMUCOSAL | Status: DC | PRN

## 2015-05-20 MED ORDER — ONDANSETRON HCL 4 MG/2ML IJ SOLN
INTRAMUSCULAR | Status: DC | PRN
  Administered 2015-05-20: 4 mg via INTRAVENOUS

## 2015-05-20 MED ORDER — CYCLOBENZAPRINE HCL 10 MG PO TABS
ORAL_TABLET | ORAL | Status: AC
  Administered 2015-05-20: 10 mg via ORAL
  Filled 2015-05-20: qty 1

## 2015-05-20 MED ORDER — LISINOPRIL-HYDROCHLOROTHIAZIDE 20-25 MG PO TABS: 1.0000 | ORAL_TABLET | Freq: Every day | ORAL | Status: DC

## 2015-05-20 MED ORDER — INSULIN ASPART 100 UNIT/ML ~~LOC~~ SOLN
0.0000 [IU] | Freq: Three times a day (TID) | SUBCUTANEOUS | Status: DC
  Administered 2015-05-21: 8 [IU] via SUBCUTANEOUS

## 2015-05-20 MED ORDER — HYDROMORPHONE HCL 1 MG/ML IJ SOLN
INTRAMUSCULAR | Status: AC
  Administered 2015-05-20: 0.5 mg via INTRAVENOUS
  Filled 2015-05-20: qty 1

## 2015-05-20 MED ORDER — FENTANYL CITRATE (PF) 100 MCG/2ML IJ SOLN
INTRAMUSCULAR | Status: DC | PRN
  Administered 2015-05-20 (×2): 50 ug via INTRAVENOUS
  Administered 2015-05-20: 100 ug via INTRAVENOUS
  Administered 2015-05-20 (×4): 50 ug via INTRAVENOUS

## 2015-05-20 MED ORDER — DOCUSATE SODIUM 100 MG PO CAPS
100.0000 mg | ORAL_CAPSULE | Freq: Two times a day (BID) | ORAL | Status: DC
  Administered 2015-05-20 – 2015-05-21 (×2): 100 mg via ORAL
  Filled 2015-05-20 (×2): qty 1

## 2015-05-20 MED ORDER — POTASSIUM CHLORIDE IN NACL 20-0.45 MEQ/L-% IV SOLN
INTRAVENOUS | Status: DC
  Administered 2015-05-20: 21:00:00 via INTRAVENOUS
  Filled 2015-05-20 (×3): qty 1000

## 2015-05-20 MED ORDER — LIDOCAINE HCL (CARDIAC) 20 MG/ML IV SOLN
INTRAVENOUS | Status: AC
  Filled 2015-05-20: qty 15

## 2015-05-20 MED ORDER — ALFUZOSIN HCL ER 10 MG PO TB24
10.0000 mg | ORAL_TABLET | Freq: Every day | ORAL | Status: DC
  Administered 2015-05-21: 10 mg via ORAL
  Filled 2015-05-20 (×2): qty 1

## 2015-05-20 MED ORDER — PHENYLEPHRINE 40 MCG/ML (10ML) SYRINGE FOR IV PUSH (FOR BLOOD PRESSURE SUPPORT)
PREFILLED_SYRINGE | INTRAVENOUS | Status: AC
  Filled 2015-05-20: qty 20

## 2015-05-20 MED ORDER — INSULIN ASPART 100 UNIT/ML FLEXPEN: 6.0000 [IU] | PEN_INJECTOR | Freq: Three times a day (TID) | SUBCUTANEOUS | Status: DC

## 2015-05-20 MED ORDER — 0.9 % SODIUM CHLORIDE (POUR BTL) OPTIME
TOPICAL | Status: DC | PRN
  Administered 2015-05-20: 1000 mL

## 2015-05-20 MED ORDER — ALUM & MAG HYDROXIDE-SIMETH 200-200-20 MG/5ML PO SUSP: 30.0000 mL | Freq: Four times a day (QID) | ORAL | Status: DC | PRN

## 2015-05-20 MED ORDER — ARTIFICIAL TEARS OP OINT
TOPICAL_OINTMENT | OPHTHALMIC | Status: AC
  Filled 2015-05-20: qty 3.5

## 2015-05-20 MED ORDER — LEVOTHYROXINE SODIUM 88 MCG PO TABS
88.0000 ug | ORAL_TABLET | Freq: Every day | ORAL | Status: DC
  Administered 2015-05-21: 88 ug via ORAL
  Filled 2015-05-20: qty 1

## 2015-05-20 MED ORDER — HYDROMORPHONE HCL 1 MG/ML IJ SOLN: 1.0000 mg | INTRAMUSCULAR | Status: DC | PRN

## 2015-05-20 MED ORDER — ONDANSETRON HCL 4 MG/2ML IJ SOLN: 4.0000 mg | INTRAMUSCULAR | Status: DC | PRN

## 2015-05-20 MED ORDER — ROCURONIUM BROMIDE 50 MG/5ML IV SOLN
INTRAVENOUS | Status: AC
  Filled 2015-05-20: qty 1

## 2015-05-20 MED ORDER — PANTOPRAZOLE SODIUM 40 MG IV SOLR
40.0000 mg | Freq: Every day | INTRAVENOUS | Status: DC
  Administered 2015-05-20: 40 mg via INTRAVENOUS
  Filled 2015-05-20: qty 40

## 2015-05-20 MED ORDER — ROCURONIUM BROMIDE 100 MG/10ML IV SOLN
INTRAVENOUS | Status: DC | PRN
  Administered 2015-05-20: 10 mg via INTRAVENOUS
  Administered 2015-05-20: 50 mg via INTRAVENOUS

## 2015-05-20 MED ORDER — LACTATED RINGERS IV SOLN
INTRAVENOUS | Status: DC
  Administered 2015-05-20 (×3): via INTRAVENOUS

## 2015-05-20 MED ORDER — FENTANYL CITRATE (PF) 250 MCG/5ML IJ SOLN
INTRAMUSCULAR | Status: AC
  Filled 2015-05-20: qty 5

## 2015-05-20 MED ORDER — OXYCODONE-ACETAMINOPHEN 5-325 MG PO TABS
ORAL_TABLET | ORAL | Status: AC
  Administered 2015-05-20: 2 via ORAL
  Filled 2015-05-20: qty 2

## 2015-05-20 MED ORDER — EPHEDRINE SULFATE 50 MG/ML IJ SOLN
INTRAMUSCULAR | Status: DC | PRN
  Administered 2015-05-20: 10 mg via INTRAVENOUS

## 2015-05-20 MED ORDER — SODIUM CHLORIDE 0.9 % IJ SOLN
3.0000 mL | Freq: Two times a day (BID) | INTRAMUSCULAR | Status: DC
  Administered 2015-05-20: 3 mL via INTRAVENOUS

## 2015-05-20 MED ORDER — DEXAMETHASONE SODIUM PHOSPHATE 10 MG/ML IJ SOLN
INTRAMUSCULAR | Status: DC | PRN
  Administered 2015-05-20: 10 mg via INTRAVENOUS

## 2015-05-20 MED ORDER — INSULIN ASPART 100 UNIT/ML ~~LOC~~ SOLN
4.0000 [IU] | Freq: Three times a day (TID) | SUBCUTANEOUS | Status: DC
  Administered 2015-05-21: 4 [IU] via SUBCUTANEOUS

## 2015-05-20 MED ORDER — SODIUM CHLORIDE 0.9 % IR SOLN
Status: DC | PRN
  Administered 2015-05-20: 500 mL

## 2015-05-20 MED ORDER — BISACODYL 5 MG PO TBEC: 5.0000 mg | DELAYED_RELEASE_TABLET | Freq: Every day | ORAL | Status: DC | PRN

## 2015-05-20 MED ORDER — OXYCODONE-ACETAMINOPHEN 5-325 MG PO TABS
1.0000 | ORAL_TABLET | ORAL | Status: DC | PRN
  Administered 2015-05-20: 2 via ORAL
  Filled 2015-05-20: qty 2

## 2015-05-20 MED ORDER — NEOSTIGMINE METHYLSULFATE 10 MG/10ML IV SOLN
INTRAVENOUS | Status: AC
  Filled 2015-05-20: qty 2

## 2015-05-20 MED ORDER — PHENYLEPHRINE HCL 10 MG/ML IJ SOLN
10.0000 mg | INTRAMUSCULAR | Status: DC | PRN
  Administered 2015-05-20: 20 ug/min via INTRAVENOUS

## 2015-05-20 MED ORDER — BUPIVACAINE HCL (PF) 0.5 % IJ SOLN
INTRAMUSCULAR | Status: DC | PRN
  Administered 2015-05-20: 10 mL

## 2015-05-20 MED ORDER — EPHEDRINE SULFATE 50 MG/ML IJ SOLN
INTRAMUSCULAR | Status: AC
  Filled 2015-05-20: qty 1

## 2015-05-20 MED ORDER — DEXAMETHASONE 4 MG PO TABS
4.0000 mg | ORAL_TABLET | Freq: Four times a day (QID) | ORAL | Status: AC
  Administered 2015-05-20 (×2): 4 mg via ORAL
  Filled 2015-05-20: qty 1

## 2015-05-20 MED ORDER — SIMVASTATIN 20 MG PO TABS: 20.0000 mg | ORAL_TABLET | Freq: Every day | ORAL | Status: DC

## 2015-05-20 MED ORDER — SODIUM CHLORIDE 0.9 % IV SOLN: 250.0000 mL | INTRAVENOUS | Status: DC

## 2015-05-20 MED ORDER — CEFAZOLIN SODIUM-DEXTROSE 2-3 GM-% IV SOLR
2.0000 g | Freq: Three times a day (TID) | INTRAVENOUS | Status: AC
  Administered 2015-05-20 – 2015-05-21 (×2): 2 g via INTRAVENOUS
  Filled 2015-05-20 (×2): qty 50

## 2015-05-20 MED ORDER — ACETAMINOPHEN 325 MG PO TABS
650.0000 mg | ORAL_TABLET | ORAL | Status: DC | PRN
  Administered 2015-05-20 – 2015-05-21 (×4): 650 mg via ORAL
  Filled 2015-05-20 (×4): qty 2

## 2015-05-20 MED ORDER — HYDROCHLOROTHIAZIDE 25 MG PO TABS
25.0000 mg | ORAL_TABLET | Freq: Every day | ORAL | Status: DC
  Administered 2015-05-20 – 2015-05-21 (×2): 25 mg via ORAL
  Filled 2015-05-20 (×2): qty 1

## 2015-05-20 MED ORDER — ACETAMINOPHEN 650 MG RE SUPP: 650.0000 mg | RECTAL | Status: DC | PRN

## 2015-05-20 MED ORDER — SURGIFOAM 100 EX MISC
CUTANEOUS | Status: DC | PRN
  Administered 2015-05-20: 20 mL via TOPICAL

## 2015-05-20 MED ORDER — FINASTERIDE 5 MG PO TABS
5.0000 mg | ORAL_TABLET | Freq: Every day | ORAL | Status: DC
  Administered 2015-05-20: 5 mg via ORAL
  Filled 2015-05-20: qty 1

## 2015-05-20 MED ORDER — SODIUM CHLORIDE 0.9 % IJ SOLN: 3.0000 mL | INTRAMUSCULAR | Status: DC | PRN

## 2015-05-20 MED ORDER — LIDOCAINE HCL (CARDIAC) 20 MG/ML IV SOLN
INTRAVENOUS | Status: DC | PRN
  Administered 2015-05-20: 70 mg via INTRAVENOUS

## 2015-05-20 MED ORDER — CYCLOBENZAPRINE HCL 10 MG PO TABS
10.0000 mg | ORAL_TABLET | Freq: Three times a day (TID) | ORAL | Status: DC | PRN
  Administered 2015-05-20: 10 mg via ORAL

## 2015-05-20 MED ORDER — DEXAMETHASONE SODIUM PHOSPHATE 4 MG/ML IJ SOLN: 4.0000 mg | Freq: Four times a day (QID) | INTRAMUSCULAR | Status: AC

## 2015-05-20 MED ORDER — PHENOL 1.4 % MT LIQD: 1.0000 | OROMUCOSAL | Status: DC | PRN

## 2015-05-20 MED ORDER — NEOSTIGMINE METHYLSULFATE 10 MG/10ML IV SOLN
INTRAVENOUS | Status: DC | PRN
  Administered 2015-05-20: 3 mg via INTRAVENOUS

## 2015-05-20 MED ORDER — INSULIN ASPART 100 UNIT/ML ~~LOC~~ SOLN
0.0000 [IU] | Freq: Every day | SUBCUTANEOUS | Status: DC
  Administered 2015-05-20: 2 [IU] via SUBCUTANEOUS

## 2015-05-20 MED ORDER — PROMETHAZINE HCL 25 MG/ML IJ SOLN: 6.2500 mg | INTRAMUSCULAR | Status: DC | PRN

## 2015-05-20 SURGICAL SUPPLY — 67 items
BAG DECANTER FOR FLEXI CONT (MISCELLANEOUS) ×3 IMPLANT
BENZOIN TINCTURE PRP APPL 2/3 (GAUZE/BANDAGES/DRESSINGS) ×6 IMPLANT
BLADE CLIPPER SURG (BLADE) ×3 IMPLANT
BONE EQUIVA 10CC (Bone Implant) ×3 IMPLANT
BRUSH SCRUB EZ PLAIN DRY (MISCELLANEOUS) ×3 IMPLANT
BUR CUTTER 7.0 ROUND (BURR) ×3 IMPLANT
BUR MATCHSTICK NEURO 3.0 LAGG (BURR) ×3 IMPLANT
CANISTER SUCT 3000ML PPV (MISCELLANEOUS) ×3 IMPLANT
CLOSURE WOUND 1/2 X4 (GAUZE/BANDAGES/DRESSINGS) ×2
CONT SPEC 4OZ CLIKSEAL STRL BL (MISCELLANEOUS) ×6 IMPLANT
CORDS BIPOLAR (ELECTRODE) ×3 IMPLANT
COVER BACK TABLE 24X17X13 BIG (DRAPES) IMPLANT
COVER BACK TABLE 60X90IN (DRAPES) ×3 IMPLANT
DRAPE C-ARM 42X72 X-RAY (DRAPES) ×6 IMPLANT
DRAPE LAPAROTOMY 100X72X124 (DRAPES) ×3 IMPLANT
DRAPE SURG 17X23 STRL (DRAPES) ×6 IMPLANT
DRSG OPSITE POSTOP 4X10 (GAUZE/BANDAGES/DRESSINGS) ×3 IMPLANT
DRSG OPSITE POSTOP 4X6 (GAUZE/BANDAGES/DRESSINGS) IMPLANT
DRSG TELFA 3X8 NADH (GAUZE/BANDAGES/DRESSINGS) IMPLANT
DURAPREP 26ML APPLICATOR (WOUND CARE) ×3 IMPLANT
ELECT REM PT RETURN 9FT ADLT (ELECTROSURGICAL) ×3
ELECTRODE REM PT RTRN 9FT ADLT (ELECTROSURGICAL) ×1 IMPLANT
EVACUATOR 1/8 PVC DRAIN (DRAIN) IMPLANT
GAUZE SPONGE 4X4 12PLY STRL (GAUZE/BANDAGES/DRESSINGS) IMPLANT
GAUZE SPONGE 4X4 16PLY XRAY LF (GAUZE/BANDAGES/DRESSINGS) IMPLANT
GLOVE BIO SURGEON STRL SZ7 (GLOVE) ×3 IMPLANT
GLOVE BIO SURGEON STRL SZ8 (GLOVE) ×3 IMPLANT
GLOVE BIOGEL PI IND STRL 7.0 (GLOVE) ×1 IMPLANT
GLOVE BIOGEL PI IND STRL 8.5 (GLOVE) ×1 IMPLANT
GLOVE BIOGEL PI INDICATOR 7.0 (GLOVE) ×2
GLOVE BIOGEL PI INDICATOR 8.5 (GLOVE) ×2
GLOVE ECLIPSE 8.0 STRL XLNG CF (GLOVE) ×6 IMPLANT
GLOVE SURG SS PI 7.0 STRL IVOR (GLOVE) ×3 IMPLANT
GOWN STRL REUS W/ TWL LRG LVL3 (GOWN DISPOSABLE) ×2 IMPLANT
GOWN STRL REUS W/ TWL XL LVL3 (GOWN DISPOSABLE) ×2 IMPLANT
GOWN STRL REUS W/TWL 2XL LVL3 (GOWN DISPOSABLE) IMPLANT
GOWN STRL REUS W/TWL LRG LVL3 (GOWN DISPOSABLE) ×4
GOWN STRL REUS W/TWL XL LVL3 (GOWN DISPOSABLE) ×4
IMPLANT PEEK ARDIS 8 X 8 X 26 (Orthopedic Implant) ×6 IMPLANT
KIT BASIN OR (CUSTOM PROCEDURE TRAY) ×3 IMPLANT
KIT ROOM TURNOVER OR (KITS) ×3 IMPLANT
LIQUID BAND (GAUZE/BANDAGES/DRESSINGS) IMPLANT
NEEDLE HYPO 22GX1.5 SAFETY (NEEDLE) ×3 IMPLANT
NS IRRIG 1000ML POUR BTL (IV SOLUTION) ×3 IMPLANT
PACK LAMINECTOMY NEURO (CUSTOM PROCEDURE TRAY) ×3 IMPLANT
PAD ARMBOARD 7.5X6 YLW CONV (MISCELLANEOUS) ×9 IMPLANT
PATTIES SURGICAL .75X.75 (GAUZE/BANDAGES/DRESSINGS) IMPLANT
PEDIGUARD CURV (INSTRUMENTS) ×3 IMPLANT
ROD PATHFINDER 50MM (Rod) ×6 IMPLANT
SCREW MIN INVASIVE 6.5X45 (Screw) ×6 IMPLANT
SCREW PATHFINDER 7.5X45 (Screw) ×6 IMPLANT
SPONGE LAP 4X18 X RAY DECT (DISPOSABLE) IMPLANT
SPONGE SURGIFOAM ABS GEL 100 (HEMOSTASIS) ×3 IMPLANT
STRIP CLOSURE SKIN 1/2X4 (GAUZE/BANDAGES/DRESSINGS) ×4 IMPLANT
SUT PROLENE 0 CT 1 30 (SUTURE) ×3 IMPLANT
SUT VIC AB 0 CT1 18XCR BRD8 (SUTURE) ×2 IMPLANT
SUT VIC AB 0 CT1 8-18 (SUTURE) ×4
SUT VIC AB 2-0 OS6 18 (SUTURE) ×9 IMPLANT
SUT VIC AB 3-0 CP2 18 (SUTURE) ×3 IMPLANT
SYR 20ML ECCENTRIC (SYRINGE) ×3 IMPLANT
TOP CLSR SEQUOIA (Orthopedic Implant) ×12 IMPLANT
TOWEL OR 17X24 6PK STRL BLUE (TOWEL DISPOSABLE) ×3 IMPLANT
TOWEL OR 17X26 10 PK STRL BLUE (TOWEL DISPOSABLE) ×3 IMPLANT
TRAP SPECIMEN MUCOUS 40CC (MISCELLANEOUS) ×3 IMPLANT
TRAY FOLEY W/METER SILVER 14FR (SET/KITS/TRAYS/PACK) IMPLANT
TRAY FOLEY W/METER SILVER 16FR (SET/KITS/TRAYS/PACK) ×3 IMPLANT
WATER STERILE IRR 1000ML POUR (IV SOLUTION) ×3 IMPLANT

## 2015-05-20 NOTE — Progress Notes (Signed)
Pt received alert, verbal with no noted distress. Pt stable, neuro intact. Able to move all extremities. Honeycomb dressing to back clean, dry, and intact. Hemovac in place. Foley patent draining clear yellow urine to bag. Pt oriented to room. Safety measures in place. Call bell within reach.

## 2015-05-20 NOTE — Anesthesia Preprocedure Evaluation (Signed)
Anesthesia Evaluation  Patient identified by MRN, date of birth, ID band Patient awake    Reviewed: Allergy & Precautions, NPO status , Patient's Chart, lab work & pertinent test results  Airway Mallampati: II  TM Distance: <3 FB Neck ROM: Full    Dental no notable dental hx.    Pulmonary neg pulmonary ROS, former smoker,  breath sounds clear to auscultation  Pulmonary exam normal       Cardiovascular negative cardio ROS Normal cardiovascular examRhythm:Regular Rate:Normal     Neuro/Psych negative neurological ROS  negative psych ROS   GI/Hepatic negative GI ROS, Neg liver ROS,   Endo/Other  diabetes, Insulin DependentHypothyroidism   Renal/GU negative Renal ROS  negative genitourinary   Musculoskeletal negative musculoskeletal ROS (+)   Abdominal   Peds negative pediatric ROS (+)  Hematology negative hematology ROS (+)   Anesthesia Other Findings   Reproductive/Obstetrics negative OB ROS                             Anesthesia Physical Anesthesia Plan  ASA: III  Anesthesia Plan: General   Post-op Pain Management:    Induction: Intravenous  Airway Management Planned: Oral ETT  Additional Equipment:   Intra-op Plan:   Post-operative Plan: Extubation in OR  Informed Consent: I have reviewed the patients History and Physical, chart, labs and discussed the procedure including the risks, benefits and alternatives for the proposed anesthesia with the patient or authorized representative who has indicated his/her understanding and acceptance.   Dental advisory given  Plan Discussed with: CRNA and Surgeon  Anesthesia Plan Comments:         Anesthesia Quick Evaluation

## 2015-05-20 NOTE — Progress Notes (Signed)
Utilization review completed.  

## 2015-05-20 NOTE — Anesthesia Procedure Notes (Signed)
Procedure Name: Intubation Date/Time: 05/20/2015 12:27 PM Performed by: Adonis HousekeeperNGELL,  M Pre-anesthesia Checklist: Patient identified, Emergency Drugs available, Suction available, Patient being monitored and Timeout performed Patient Re-evaluated:Patient Re-evaluated prior to inductionOxygen Delivery Method: Circle system utilized Preoxygenation: Pre-oxygenation with 100% oxygen Intubation Type: IV induction Ventilation: Mask ventilation without difficulty Laryngoscope Size: Mac and 4 Grade View: Grade III Tube type: Oral Tube size: 7.0 mm Number of attempts: 1 Airway Equipment and Method: Stylet Placement Confirmation: ETT inserted through vocal cords under direct vision,  positive ETCO2 and breath sounds checked- equal and bilateral Secured at: 22 cm Tube secured with: Tape Dental Injury: Teeth and Oropharynx as per pre-operative assessment

## 2015-05-20 NOTE — Transfer of Care (Signed)
Immediate Anesthesia Transfer of Care Note  Patient: David Mathews  Procedure(s) Performed: Procedure(s): PLIF - L2-L3; extension of instrumentation; removal of solera (medtronic ) screws (N/A)  Patient Location: PACU  Anesthesia Type:General  Level of Consciousness: awake, alert , oriented and patient cooperative  Airway & Oxygen Therapy: Patient Spontanous Breathing and Patient connected to nasal cannula oxygen  Post-op Assessment: Report given to RN, Post -op Vital signs reviewed and stable, Patient moving all extremities and Patient moving all extremities X 4  Post vital signs: Reviewed and stable  Last Vitals:  Filed Vitals:   05/20/15 0941  BP: 121/70  Pulse: 64  Temp: 36.3 C  Resp: 18    Complications: No apparent anesthesia complications

## 2015-05-20 NOTE — Op Note (Signed)
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 Superior third of the previous lumbar incision was opened up and extended superiorly. We carried down to the first palpable spinous processes which we felt was L2. Subperiosteal dissection was then carried out bilaterally along the spinous processes lamina facet joint and the far lateral region to identify the transverse process of L2 bilaterally. Dissection was then carried out through the soft tissue to identify the pedicle screws at L3 bilaterally and the rod between L3 and L4. Locking cap was removed bilaterally off of the screws at L3 and then high-speed metal cutting bur was used to drill through the rod inferior to the L3 screw. The rod was then removed and the screws at L3 were removed bilaterally and replaced with Pathfinder 7.5 x 45 mm screws. Bilateral decompression was then carried out at L2-3 by removing the inferior 80% of the L2 lamina the medial three quarters of the facet joint and the superior third of the L3 lamina. Residual bone and markedly hypertrophic ligamentum flavum removed bilaterally to decompress the central lateral recess stenosis. We then did bilateral microdiscectomy. We then prepared the disc space for interbody fusion. He was distracted up to an 8 mm size which was found to be a good choice. We chose to 8 mm x 26 mm x 9 mm cages and filled with a mixture of autologous bone morselized allograft. First space was  placed and then prior to placing the second spacer the mixture of autologous bone and morselized allograft was placed deep within the interspace to help with interbody fusion. Second was then placed found to be in excellent position. Large amounts of irrigation were carried out any bleeding control proper correlation Gelfoam. Pedicle screws were then placed an L2 without difficulty. Drill hole entry point was placed followed by passing of the ultrasonic pedicle all. We tapped with a 6 number tap and then placed 6.5 x 45 mm screws bilaterally L2. These were found to be in good position under fluoroscopy. We then decorticated the far lateral region placed a mixture of Botox bone morselized allograft for posterolateral fusion. We then chose appropriate length rods and secured to the top of the screws with top loading caps and did tightening and final tightening with torque and counter torque. Final fluoroscopy showed good position of the interbody spacers screws and rods. Irrigation was carried out and any bleeding control proper coagulation Gelfoam. An epidural drain was left in the epidural space and brought out through separate stab wound incision. The was then closed in multiple layers by: The muscle fascia subcutaneous and subcutaneous trigger tissues. Running locking point was placed on the skin. A sterile dressing was then applied and the patient was extubated and taken to recovery room in stable condition.

## 2015-05-20 NOTE — H&P (Signed)
David Mathews is an 74 y.o. male.   Chief Complaint: Back pain into the right leg HPI: The patient is a 50 old gentleman who is evaluated in the office for back pain with radiation the right leg of a few years duration. He had a two-level back fusion in 2011 and did well for a few years but then began to develop this difficulty. He was being followed by an orthopedist in [place] who got x-rays and said that he may have adjacent level disease above the fusion. He continued to live with the problem until the last few months when things been getting progressively worse. Cells medical doctor was tried on conservative therapy without improvement and he was then sent for evaluation. He says that at this time the left leg has minimal discomfort. 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 After discussing the options the patient requested surgery now comes for extension of his fusion L2-3. I've had a long discussion with him regarding the risks and benefits of surgical intervention. The risks discussed include but are not limited to bleeding infection weakness numbness paralysis spinal fluid leak trouble with instrumentation nonunion coma and death. We have discussed alternative methods of therapy along the risks and benefits of nonintervention. He's had the opportunity to ask numerous questions and appears to understand. With this information in hand he has requested we proceed with surgery.  Past Medical History  Diagnosis Date  . Hypothyroidism   . Diabetes mellitus without complication   . Arthritis     Past Surgical History  Procedure Laterality Date  . Back surgery  10/2010  . Partial knee arthroplasty Bilateral 09/2009  . Carpal tunnel release Left [id number]  . Carpal tunnel release Right 2009    History reviewed. No pertinent family history. Social History:  reports that he quit  smoking about 16 years ago. His smoking use included Cigarettes. He has a 10 pack-year smoking history. He does not have any smokeless tobacco history on file. He reports that he does not drink alcohol or use illicit drugs.  Allergies: Not on File  Medications Prior to Admission  Medication Sig Dispense Refill  . acetaminophen (TYLENOL) 500 MG tablet Take 1,000 mg by mouth every 8 (eight) hours as needed.    [name] alfuzosin (UROXATRAL) 10 MG 24 hr tablet Take 10 mg by mouth daily with breakfast.    . aspirin EC 81 MG tablet Take 81 mg by mouth daily.    . Cholecalciferol (VITAMIN D PO) Take 1 tablet by mouth daily.    . Cyanocobalamin (VITAMIN B 12 PO) Take 1 tablet by mouth daily.    . diclofenac (VOLTAREN) 75 MG EC tablet Take 75 mg by mouth 2 (two) times daily.    . finasteride (PROSCAR) 5 MG tablet Take 5 mg by mouth daily.    . insulin aspart (NOVOLOG FLEXPEN) 100 UNIT/ML FlexPen Inject 6-10 Units into the skin 3 (three) times daily with meals.     . Insulin Glargine (LANTUS SOLOSTAR) 100 UNIT/ML Solostar Pen Inject 40-45 Units into the skin daily.    [name] levothyroxine (SYNTHROID, LEVOTHROID) 88 MCG tablet Take 88 mcg by mouth daily before breakfast.    . lisinopril-hydrochlorothiazide (PRINZIDE,ZESTORETIC) 20-25 MG per tablet Take 1 tablet by mouth daily.    [name] MAGNESIUM PO Take 1 tablet by mouth daily.    . Multiple Vitamins-Minerals (MULTIVITAMIN PO) Take 1  tablet by mouth daily.    [name]. POTASSIUM PO Take 1 tablet by mouth 2 (two) times daily.    . pregabalin (LYRICA) 150 MG capsule Take 150 mg by mouth 2 (two) times daily.    . simvastatin (ZOCOR) 20 MG tablet Take 20 mg by mouth daily.      Results for orders placed or performed during the hospital encounter of 05/20/15 (from the past 48 hour(s))  Glucose, capillary     Status: Abnormal   Collection Time: 05/20/15  9:36 AM  Result Value Ref Range   Glucose-Capillary 142 (H) 65 - 99 mg/dL   No results found.  Positive for balance  disturbance nasal congestion swelling in his feet difficulty with his urinary stream  Blood pressure 121/70, pulse 64, temperature 97.3 F (36.3 C), temperature source Oral, resp. rate 18, height 5\' 9"  (1.753 m), weight 89.359 kg (197 lb), SpO2 97 %.  The patient is awake alert and oriented. His no facial asymmetry. His strength is 5 over 5 and sensation is intact. Reflexes are decreased but equal. 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 The plan is for extension of his fusion to L2-3 with extension of instrumentation.  Reinaldo MeekerKRITZER, O, MD 05/20/2015, 11:55 AM Back pain into the right leg

## 2015-05-21 LAB — GLUCOSE, CAPILLARY
GLUCOSE-CAPILLARY: 363 mg/dL — AB (ref 65–99)
Glucose-Capillary: 273 mg/dL — ABNORMAL HIGH (ref 65–99)
Glucose-Capillary: 350 mg/dL — ABNORMAL HIGH (ref 65–99)

## 2015-05-21 MED ORDER — PANTOPRAZOLE SODIUM 40 MG PO TBEC
40.0000 mg | DELAYED_RELEASE_TABLET | Freq: Every day | ORAL | Status: DC
  Administered 2015-05-21: 40 mg via ORAL
  Filled 2015-05-21: qty 1

## 2015-05-21 MED ORDER — DIAZEPAM 5 MG PO TABS: 5.0000 mg | ORAL_TABLET | Freq: Four times a day (QID) | ORAL | Status: DC | PRN

## 2015-05-21 NOTE — Progress Notes (Signed)
Pt ambulated in the hall way  200 ft with this nurse stand by assist with rolling walker. Gait steady. Brace on and aligned. No noted distress.

## 2015-05-21 NOTE — Progress Notes (Signed)
Pt discharging at this time with his son taking all personal belongings. Alert, verbal with no noted distress. Pt denies pain or discomfort. IV discontinued, dry dressing applied. Discharge instructions provided with prescription with verbal understanding. Pt stated that he has already arranged follow up appt.

## 2015-05-21 NOTE — Progress Notes (Signed)
Hemovac removed emptied 150 ml. Dry dressing applied. Surgical suture site clean, dry, and intact. No drainage. Pt denied pain or discomfort. Pt positioned on his side resting at this time. Will monitor. Daughter at bedside. Call bell within reach.

## 2015-05-21 NOTE — Discharge Summary (Signed)
Physician Discharge Summary  Patient ID: [name] MRN: [id number] DOB/AGE: 73/05/1941 73 y.o.  Admit date: 05/20/2015 Discharge date: 05/21/2015  Admission Diagnoses:  Discharge Diagnoses:  Active Problems:   Spinal stenosis, lumbar   Discharged Condition: good  Hospital Course: Patient admitted to the hospital where he underwent and, K lumbar decompression and fusion. Postoperative use doing very well. Pain very well controlled. No new lower extremity symptoms. Standing and ambulating with minimal difficulty. Ready for discharge home.  Consults:   Significant Diagnostic Studies:   Treatments:   Discharge Exam: Blood pressure 141/97, pulse 72, temperature 98.7 F (37.1 C), temperature source Oral, resp. rate 18, height 5\' 9"  (1.753 m), weight 89.359 kg (197 lb), SpO2 100 %. Awake and alert. Oriented and appropriate. Motor sensory function intact. Wound clean and dry. Chest and abdomen benign.  Disposition: Final discharge disposition not confirmed  Discharge Instructions    Discontinue JP/Blake drain    Complete by:  As directed             Medication List    TAKE these medications        acetaminophen 500 MG tablet  Commonly known as:  TYLENOL  Take 1,000 mg by mouth every 8 (eight) hours as needed.     aspirin EC 81 MG tablet  Take 81 mg by mouth daily.     diazepam 5 MG tablet  Commonly known as:  VALIUM  Take 1 tablet (5 mg total) by mouth every 6 (six) hours as needed for muscle spasms.     diclofenac 75 MG EC tablet  Commonly known as:  VOLTAREN  Take 75 mg by mouth 2 (two) times daily.     finasteride 5 MG tablet  Commonly known as:  PROSCAR  Take 5 mg by mouth daily.     LANTUS SOLOSTAR 100 UNIT/ML Solostar Pen  Generic drug:  Insulin Glargine  Inject 40-45 Units into the skin daily.     levothyroxine 88 MCG tablet  Commonly known as:  SYNTHROID, LEVOTHROID  Take 88 mcg by mouth daily before breakfast.     lisinopril-hydrochlorothiazide 20-25 MG per tablet  Commonly known as:  PRINZIDE,ZESTORETIC  Take 1 tablet by mouth daily.     MAGNESIUM PO  Take 1 tablet by mouth daily.     MULTIVITAMIN PO  Take 1 tablet by mouth daily.     NOVOLOG FLEXPEN 100 UNIT/ML FlexPen  Generic drug:  insulin aspart  Inject 6-10 Units into the skin 3 (three) times daily with meals.     POTASSIUM PO  Take 1 tablet by mouth 2 (two) times daily.     pregabalin 150 MG capsule  Commonly known as:  LYRICA  Take 150 mg by mouth 2 (two) times daily.     simvastatin 20 MG tablet  Commonly known as:  ZOCOR  Take 20 mg by mouth daily.     UROXATRAL 10 MG 24 hr tablet  Generic drug:  alfuzosin  Take 10 mg by mouth daily with breakfast.     VITAMIN B 12 PO  Take 1 tablet by mouth daily.     VITAMIN D PO  Take 1 tablet by mouth daily.           Follow-up Information    Follow up with Reinaldo MeekerKRITZER,RANDY O, MD.   Specialty:  Neurosurgery   Contact information:   [address] [place] [address] [phone]       Signed: Temple PaciniOOL, A 05/21/2015,  1:25 PM

## 2015-05-21 NOTE — Progress Notes (Signed)
Pt refused to use the bathroom with staff assistance. He is steady with gait using walker but not steady enough for independence.  Brace on and aligned while he is up. He stated that he cannot concentrate on using the bathroom if staff is standing in the bathroom with him. He was informed that staff has to assist him to the bathroom due to safety concerns and that he is at risk for falling but he does have the right to refuse. Pt stated in the presence of daughter, aide, and this nurse that he is responsible for himself and if he falls he takes full responsibility. Pt is alert and oriented x4.  He was able to void. He used bathroom alarm to alert staff when he was done using the bathroom and staff assisted him back to bed. Alarm on and in place. Call bell within reach. Will continue to monitor.

## 2015-05-21 NOTE — Progress Notes (Signed)
Pt complained of discomfort to bladder. Assessment done lower abdomen soft, but tender to touch.  Pt stated that has prostate issues and sometimes has trouble voiding. Attempt x 2 this am, pt still unable to void. Foley catheter removed by night shift nurse this am. Bladder scanned 861 ml. Straight cath 870 ml emptied.  Pt stated that he felt relief.  Will monitor.

## 2015-05-21 NOTE — Progress Notes (Signed)
Pt ambulated to the bathroom this am with rolling walker standby assist. Brace on and aligned. He denied pain or discomfort. Sitting up in chair watching television. Call bell within reach. Will continue to monitor.

## 2015-05-21 NOTE — Anesthesia Postprocedure Evaluation (Signed)
  Anesthesia Post-op Note  Patient: David Mathews  Procedure(s) Performed: Procedure(s) (LRB): PLIF - L2-L3; extension of instrumentation; removal of solera (medtronic ) screws (N/A)  Patient Location: PACU  Anesthesia Type: General  Level of Consciousness: awake and alert   Airway and Oxygen Therapy: Patient Spontanous Breathing  Post-op Pain: mild  Post-op Assessment: Post-op Vital signs reviewed, Patient's Cardiovascular Status Stable, Respiratory Function Stable, Patent Airway and No signs of Nausea or vomiting  Last Vitals:  Filed Vitals:   05/21/15 1312  BP: 141/97  Pulse: 72  Temp: 37.1 C  Resp: 18    Post-op Vital Signs: stable   Complications: No apparent anesthesia complications

## 2015-05-31 DIAGNOSIS — Z1389 Encounter for screening for other disorder: Secondary | ICD-10-CM | POA: Diagnosis not present

## 2015-05-31 DIAGNOSIS — Z6829 Body mass index (BMI) 29.0-29.9, adult: Secondary | ICD-10-CM | POA: Diagnosis not present

## 2015-05-31 DIAGNOSIS — Z5189 Encounter for other specified aftercare: Secondary | ICD-10-CM | POA: Diagnosis not present

## 2015-05-31 DIAGNOSIS — Z789 Other specified health status: Secondary | ICD-10-CM | POA: Diagnosis not present

## 2015-06-03 DIAGNOSIS — T8189XS Other complications of procedures, not elsewhere classified, sequela: Secondary | ICD-10-CM | POA: Diagnosis not present

## 2015-06-03 DIAGNOSIS — Z789 Other specified health status: Secondary | ICD-10-CM | POA: Diagnosis not present

## 2015-06-03 DIAGNOSIS — M62830 Muscle spasm of back: Secondary | ICD-10-CM | POA: Diagnosis not present

## 2015-06-03 DIAGNOSIS — Z6829 Body mass index (BMI) 29.0-29.9, adult: Secondary | ICD-10-CM | POA: Diagnosis not present

## 2015-06-03 DIAGNOSIS — M5416 Radiculopathy, lumbar region: Secondary | ICD-10-CM | POA: Diagnosis not present

## 2015-06-14 ENCOUNTER — Emergency Department (HOSPITAL_BASED_OUTPATIENT_CLINIC_OR_DEPARTMENT_OTHER)
Admit: 2015-06-14 | Discharge: 2015-06-14 | Disposition: A | Discharge status: Home / Self Care | Payer: Medicare Other | Attending: Emergency Medicine | Admitting: Emergency Medicine

## 2015-06-14 ENCOUNTER — Other Ambulatory Visit: Payer: Self-pay

## 2015-06-14 ENCOUNTER — Encounter (HOSPITAL_COMMUNITY): Payer: Self-pay | Admitting: Emergency Medicine

## 2015-06-14 ENCOUNTER — Inpatient Hospital Stay (HOSPITAL_COMMUNITY)
Admission: EM | Admit: 2015-06-14 | Discharge: 2015-06-18 | DRG: 464 | Disposition: A | Discharge status: Skilled Nursing Facility | Payer: Medicare Other | Attending: Internal Medicine | Admitting: Internal Medicine

## 2015-06-14 ENCOUNTER — Emergency Department (HOSPITAL_COMMUNITY): Payer: Medicare Other

## 2015-06-14 DIAGNOSIS — R338 Other retention of urine: Secondary | ICD-10-CM | POA: Diagnosis not present

## 2015-06-14 DIAGNOSIS — E871 Hypo-osmolality and hyponatremia: Secondary | ICD-10-CM | POA: Diagnosis present

## 2015-06-14 DIAGNOSIS — Z22322 Carrier or suspected carrier of Methicillin resistant Staphylococcus aureus: Secondary | ICD-10-CM | POA: Diagnosis not present

## 2015-06-14 DIAGNOSIS — B9689 Other specified bacterial agents as the cause of diseases classified elsewhere: Secondary | ICD-10-CM | POA: Diagnosis not present

## 2015-06-14 DIAGNOSIS — Z981 Arthrodesis status: Secondary | ICD-10-CM | POA: Diagnosis not present

## 2015-06-14 DIAGNOSIS — N401 Enlarged prostate with lower urinary tract symptoms: Secondary | ICD-10-CM | POA: Diagnosis present

## 2015-06-14 DIAGNOSIS — Z79899 Other long term (current) drug therapy: Secondary | ICD-10-CM

## 2015-06-14 DIAGNOSIS — Z794 Long term (current) use of insulin: Secondary | ICD-10-CM | POA: Diagnosis not present

## 2015-06-14 DIAGNOSIS — R6520 Severe sepsis without septic shock: Secondary | ICD-10-CM | POA: Diagnosis not present

## 2015-06-14 DIAGNOSIS — T8459XA Infection and inflammatory reaction due to other internal joint prosthesis, initial encounter: Secondary | ICD-10-CM

## 2015-06-14 DIAGNOSIS — R600 Localized edema: Secondary | ICD-10-CM | POA: Diagnosis not present

## 2015-06-14 DIAGNOSIS — R609 Edema, unspecified: Secondary | ICD-10-CM | POA: Diagnosis not present

## 2015-06-14 DIAGNOSIS — T84042S Periprosthetic fracture around internal prosthetic right knee joint, sequela: Secondary | ICD-10-CM | POA: Diagnosis not present

## 2015-06-14 DIAGNOSIS — T847XXA Infection and inflammatory reaction due to other internal orthopedic prosthetic devices, implants and grafts, initial encounter: Secondary | ICD-10-CM | POA: Diagnosis not present

## 2015-06-14 DIAGNOSIS — M25461 Effusion, right knee: Secondary | ICD-10-CM | POA: Diagnosis present

## 2015-06-14 DIAGNOSIS — M1990 Unspecified osteoarthritis, unspecified site: Secondary | ICD-10-CM | POA: Diagnosis not present

## 2015-06-14 DIAGNOSIS — N400 Enlarged prostate without lower urinary tract symptoms: Secondary | ICD-10-CM | POA: Diagnosis not present

## 2015-06-14 DIAGNOSIS — L03115 Cellulitis of right lower limb: Secondary | ICD-10-CM

## 2015-06-14 DIAGNOSIS — R2689 Other abnormalities of gait and mobility: Secondary | ICD-10-CM | POA: Diagnosis not present

## 2015-06-14 DIAGNOSIS — Z9889 Other specified postprocedural states: Secondary | ICD-10-CM

## 2015-06-14 DIAGNOSIS — E875 Hyperkalemia: Secondary | ICD-10-CM | POA: Diagnosis present

## 2015-06-14 DIAGNOSIS — T8189XA Other complications of procedures, not elsewhere classified, initial encounter: Secondary | ICD-10-CM | POA: Diagnosis not present

## 2015-06-14 DIAGNOSIS — Z87891 Personal history of nicotine dependence: Secondary | ICD-10-CM

## 2015-06-14 DIAGNOSIS — E1165 Type 2 diabetes mellitus with hyperglycemia: Secondary | ICD-10-CM | POA: Diagnosis not present

## 2015-06-14 DIAGNOSIS — Z96651 Presence of right artificial knee joint: Secondary | ICD-10-CM | POA: Diagnosis not present

## 2015-06-14 DIAGNOSIS — Z791 Long term (current) use of non-steroidal anti-inflammatories (NSAID): Secondary | ICD-10-CM

## 2015-06-14 DIAGNOSIS — Z96659 Presence of unspecified artificial knee joint: Secondary | ICD-10-CM | POA: Diagnosis not present

## 2015-06-14 DIAGNOSIS — M4806 Spinal stenosis, lumbar region: Secondary | ICD-10-CM | POA: Diagnosis present

## 2015-06-14 DIAGNOSIS — T8450XD Infection and inflammatory reaction due to unspecified internal joint prosthesis, subsequent encounter: Secondary | ICD-10-CM | POA: Diagnosis not present

## 2015-06-14 DIAGNOSIS — Z7982 Long term (current) use of aspirin: Secondary | ICD-10-CM

## 2015-06-14 DIAGNOSIS — M6281 Muscle weakness (generalized): Secondary | ICD-10-CM | POA: Diagnosis not present

## 2015-06-14 DIAGNOSIS — T814XXA Infection following a procedure, initial encounter: Secondary | ICD-10-CM | POA: Diagnosis present

## 2015-06-14 DIAGNOSIS — E039 Hypothyroidism, unspecified: Secondary | ICD-10-CM | POA: Diagnosis present

## 2015-06-14 DIAGNOSIS — L0390 Cellulitis, unspecified: Secondary | ICD-10-CM | POA: Insufficient documentation

## 2015-06-14 DIAGNOSIS — S80251A Superficial foreign body, right knee, initial encounter: Secondary | ICD-10-CM | POA: Diagnosis not present

## 2015-06-14 DIAGNOSIS — Z741 Need for assistance with personal care: Secondary | ICD-10-CM | POA: Diagnosis not present

## 2015-06-14 DIAGNOSIS — M00061 Staphylococcal arthritis, right knee: Secondary | ICD-10-CM | POA: Diagnosis present

## 2015-06-14 DIAGNOSIS — Y838 Other surgical procedures as the cause of abnormal reaction of the patient, or of later complication, without mention of misadventure at the time of the procedure: Secondary | ICD-10-CM | POA: Diagnosis present

## 2015-06-14 DIAGNOSIS — I10 Essential (primary) hypertension: Secondary | ICD-10-CM | POA: Diagnosis not present

## 2015-06-14 DIAGNOSIS — T8453XA Infection and inflammatory reaction due to internal right knee prosthesis, initial encounter: Secondary | ICD-10-CM | POA: Diagnosis not present

## 2015-06-14 DIAGNOSIS — M25561 Pain in right knee: Secondary | ICD-10-CM | POA: Diagnosis not present

## 2015-06-14 DIAGNOSIS — M48061 Spinal stenosis, lumbar region without neurogenic claudication: Secondary | ICD-10-CM | POA: Diagnosis present

## 2015-06-14 DIAGNOSIS — B9561 Methicillin susceptible Staphylococcus aureus infection as the cause of diseases classified elsewhere: Secondary | ICD-10-CM | POA: Diagnosis present

## 2015-06-14 DIAGNOSIS — Z96653 Presence of artificial knee joint, bilateral: Secondary | ICD-10-CM | POA: Diagnosis present

## 2015-06-14 DIAGNOSIS — E860 Dehydration: Secondary | ICD-10-CM | POA: Diagnosis present

## 2015-06-14 DIAGNOSIS — I509 Heart failure, unspecified: Secondary | ICD-10-CM | POA: Diagnosis not present

## 2015-06-14 DIAGNOSIS — IMO0002 Reserved for concepts with insufficient information to code with codable children: Secondary | ICD-10-CM | POA: Diagnosis present

## 2015-06-14 DIAGNOSIS — E785 Hyperlipidemia, unspecified: Secondary | ICD-10-CM | POA: Diagnosis present

## 2015-06-14 DIAGNOSIS — E11649 Type 2 diabetes mellitus with hypoglycemia without coma: Secondary | ICD-10-CM | POA: Diagnosis not present

## 2015-06-14 DIAGNOSIS — E038 Other specified hypothyroidism: Secondary | ICD-10-CM | POA: Diagnosis not present

## 2015-06-14 HISTORY — DX: Presence of unspecified artificial knee joint: T84.59XA

## 2015-06-14 HISTORY — DX: Presence of unspecified artificial knee joint: Z96.659

## 2015-06-14 HISTORY — DX: Infection and inflammatory reaction due to other internal joint prosthesis, initial encounter: T84.59XA

## 2015-06-14 LAB — BASIC METABOLIC PANEL
Anion gap: 11 (ref 5–15)
BUN: 9 mg/dL (ref 6–20)
CHLORIDE: 85 mmol/L — AB (ref 101–111)
CO2: 25 mmol/L (ref 22–32)
CREATININE: 1.02 mg/dL (ref 0.61–1.24)
Calcium: 8.7 mg/dL — ABNORMAL LOW (ref 8.9–10.3)
GFR calc Af Amer: >60 mL/min (ref >60)
Glucose, Bld: 472 mg/dL — ABNORMAL HIGH (ref 65–99)
POTASSIUM: 4.9 mmol/L (ref 3.5–5.1)
SODIUM: 121 mmol/L — AB (ref 135–145)

## 2015-06-14 LAB — CBC WITH DIFFERENTIAL/PLATELET
Basophils Absolute: 0 10*3/uL (ref 0.0–0.1)
Basophils Relative: 0 % (ref 0–1)
EOS ABS: 0.1 10*3/uL (ref 0.0–0.7)
EOS PCT: 0 % (ref 0–5)
HEMATOCRIT: 31.6 % — AB (ref 39.0–52.0)
HEMOGLOBIN: 11 g/dL — AB (ref 13.0–17.0)
LYMPHS PCT: 4 % — AB (ref 12–46)
Lymphs Abs: 1 10*3/uL (ref 0.7–4.0)
MCH: 30.1 pg (ref 26.0–34.0)
MCHC: 34.8 g/dL (ref 30.0–36.0)
MCV: 86.6 fL (ref 78.0–100.0)
MONO ABS: 1.2 10*3/uL — AB (ref 0.1–1.0)
Monocytes Relative: 5 % (ref 3–12)
NEUTROS ABS: 21.3 10*3/uL — AB (ref 1.7–7.7)
Neutrophils Relative %: 91 % — ABNORMAL HIGH (ref 43–77)
PLATELETS: 459 10*3/uL — AB (ref 150–400)
RBC: 3.65 MIL/uL — ABNORMAL LOW (ref 4.22–5.81)
RDW: 13.7 % (ref 11.5–15.5)
WBC: 23.5 10*3/uL — ABNORMAL HIGH (ref 4.0–10.5)

## 2015-06-14 LAB — I-STAT CG4 LACTIC ACID, ED: LACTIC ACID, VENOUS: 1.07 mmol/L (ref 0.5–2.0)

## 2015-06-14 LAB — C-REACTIVE PROTEIN: CRP: 30.3 mg/dL — AB (ref <1.0)

## 2015-06-14 LAB — SEDIMENTATION RATE: Sed Rate: 118 mm/hr — ABNORMAL HIGH (ref 0–16)

## 2015-06-14 MED ORDER — CLINDAMYCIN PHOSPHATE 600 MG/50ML IV SOLN
600.0000 mg | Freq: Once | INTRAVENOUS | Status: AC
  Administered 2015-06-14: 600 mg via INTRAVENOUS
  Filled 2015-06-14: qty 50

## 2015-06-14 MED ORDER — KETOROLAC TROMETHAMINE 30 MG/ML IJ SOLN
30.0000 mg | Freq: Once | INTRAMUSCULAR | Status: AC
  Administered 2015-06-14: 30 mg via INTRAVENOUS
  Filled 2015-06-14: qty 1

## 2015-06-14 MED ORDER — SODIUM CHLORIDE 0.9 % IV BOLUS (SEPSIS)
1000.0000 mL | Freq: Once | INTRAVENOUS | Status: AC
  Administered 2015-06-14: 1000 mL via INTRAVENOUS

## 2015-06-14 NOTE — ED Notes (Signed)
The patient said Dr. Theophilus Kinds sent him here for a possible DVT.  He does have leg swelling and redness.  He denies any injury to the right leg other than "straining" to sit on the toilet.   The patient said he just had recent back surgery on the 30th of June.  He went to his follow up appointment and the MD is concerned for DVT so he sent him here to be evaluated.

## 2015-06-14 NOTE — ED Provider Notes (Signed)
CSN: [id number]     Arrival date & time 06/14/15  1647 History   First MD Initiated Contact with Patient 06/14/15 2133     Chief Complaint  Patient presents with  . DVT    The patient said Dr. Donnamarie Rossetti sent him here for a possible DVT.  He does have leg swelling and redness.  He denies any injury to the right leg other than "straining" to sit on the toilet.     (Consider location/radiation/quality/duration/timing/severity/associated sxs/prior Treatment) HPI Patient is a 74 year old male with a history of diabetes, hypothyroidism, multiple back surgeries presented today for possible DVT from the orthopedic office. Patient reports he had a recent low back surgery which was complicated by mild wound dehiscence with some drainage. This is been increasing and he has been seeing his orthopedist for. Over the past week he has also been having right lower extremity swelling. Has been evaluated in the orthopedist previously but has continued to swell. On his visit to his orthopedist today and had greatly increased from previous visit and there is concern for DVT. Patient subsequent sent to the emergency department for further evaluation. On initial presentation patient reports pain and mild tenderness over the knee. Denies any chest pain, shortness of breath, nausea, chills. He does admit mild low-grade fevers at home.  Past Medical History  Diagnosis Date  . Hypothyroidism   . Diabetes mellitus without complication   . Arthritis    Past Surgical History  Procedure Laterality Date  . Back surgery  10/2010  . Partial knee arthroplasty Bilateral 09/2009  . Carpal tunnel release Left [id number]  . Carpal tunnel release Right 2009   History reviewed. No pertinent family history. History  Substance Use Topics  . Smoking status: Former Smoker -- 1.00 packs/day for 10 years    Types: Cigarettes    Quit date: 05/17/1999  . Smokeless tobacco: Not on file  . Alcohol Use: No     Review of  Systems Constitutional: Positive for fever and chills.  HENT: Negative for congestion and sore throat.   Eyes: Negative for pain.  Respiratory: Negative for cough and shortness of breath.   Cardiovascular: Positive for leg swelling. Negative for chest pain and palpitations.  Gastrointestinal: Negative for nausea, vomiting, abdominal pain and diarrhea.  Endocrine: Negative.   Genitourinary: Negative for flank pain.  Musculoskeletal: Positive for back pain. Negative for neck pain.  Skin: Positive for wound (over previous surgical site. ). Negative for rash.  Allergic/Immunologic: Negative.   Neurological: Negative for dizziness, syncope and light-headedness.  Psychiatric/Behavioral: Negative for confusion.    Allergies  Review of patient's allergies indicates no known allergies.  Home Medications   Prior to Admission medications   Medication Sig Start Date End Date Taking? Authorizing Provider  acetaminophen (TYLENOL) 500 MG tablet Take 1,000 mg by mouth every 8 (eight) hours as needed for mild pain.    Yes Historical Provider, MD  alfuzosin (UROXATRAL) 10 MG 24 hr tablet Take 10 mg by mouth daily with breakfast.   Yes Historical Provider, MD  aspirin EC 81 MG tablet Take 81 mg by mouth daily.   Yes Historical Provider, MD  Cholecalciferol (VITAMIN D PO) Take 1 tablet by mouth daily.   Yes Historical Provider, MD  Cyanocobalamin (VITAMIN B 12 PO) Take 1 tablet by mouth daily.   Yes Historical Provider, MD  diclofenac (VOLTAREN) 75 MG EC tablet Take 75 mg by mouth 2 (two) times daily.   Yes Historical Provider, MD  finasteride (PROSCAR)  5 MG tablet Take 5 mg by mouth daily.   Yes Historical Provider, MD  insulin aspart (NOVOLOG FLEXPEN) 100 UNIT/ML FlexPen Inject 6-10 Units into the skin 3 (three) times daily with meals.    Yes Historical Provider, MD  Insulin Glargine (LANTUS SOLOSTAR) 100 UNIT/ML Solostar Pen Inject 40-45 Units into the skin daily. Per sliding scale   Yes Historical  Provider, MD  levothyroxine (SYNTHROID, LEVOTHROID) 88 MCG tablet Take 88 mcg by mouth daily before breakfast.   Yes Historical Provider, MD  lisinopril-hydrochlorothiazide (PRINZIDE,ZESTORETIC) 20-25 MG per tablet Take 1 tablet by mouth daily.   Yes Historical Provider, MD  MAGNESIUM PO Take 1 tablet by mouth daily.   Yes Historical Provider, MD  Multiple Vitamins-Minerals (MULTIVITAMIN PO) Take 1 tablet by mouth daily.   Yes Historical Provider, MD  Potassium 99 MG TABS Take 1 tablet by mouth 2 (two) times daily.   Yes Historical Provider, MD  pregabalin (LYRICA) 150 MG capsule Take 150 mg by mouth 2 (two) times daily.   Yes Historical Provider, MD  simvastatin (ZOCOR) 20 MG tablet Take 20 mg by mouth daily.   Yes Historical Provider, MD  traMADol (ULTRAM) 50 MG tablet Take 50 mg by mouth every 6 (six) hours as needed for moderate pain.   Yes Historical Provider, MD  diazepam (VALIUM) 5 MG tablet Take 1 tablet (5 mg total) by mouth every 6 (six) hours as needed for muscle spasms. Patient not taking: Reported on 06/14/2015 05/21/15   Earnie Larsson, MD   BP 93/49 mmHg  Pulse 85  Temp(Src) 98 F (36.7 C) (Oral)  Resp 20  Ht $R'5\' 9"'Ub$  (1.753 m)  Wt 195 lb 1.7 oz (88.5 kg)  BMI 28.80 kg/m2  SpO2 95% Physical Exam Constitutional: He is oriented to person, place, and time. He appears well-developed and well-nourished.  HENT:  Head: Normocephalic and atraumatic.  Eyes: Conjunctivae and EOM are normal. Pupils are equal, round, and reactive to light.  Neck: Normal range of motion. Neck supple.  Cardiovascular: Regular rhythm, normal heart sounds and intact distal pulses.  Tachycardia present.   Pulses:      Radial pulses are 2+ on the right side, and 2+ on the left side.       Dorsalis pedis pulses are 2+ on the right side, and 2+ on the left side.       Posterior tibial pulses are 2+ on the right side, and 2+ on the left side.  Pulmonary/Chest: Effort normal and breath sounds normal. No respiratory  distress.  Abdominal: Soft. Bowel sounds are normal. There is no tenderness.  Musculoskeletal: Normal range of motion.       Right knee: He exhibits swelling and erythema. Tenderness found.       Left hand: He exhibits tenderness. Normal sensation noted. Normal strength noted.  RLE swelling up to mid thigh with medial erythema extending to the groin.  Warm to touch with no fluctuance.    Left wring finger with erythema, swelling, and no induration.   LUE and RLE neurovasculalry intact.   Neurological: He is alert and oriented to person, place, and time. He has normal reflexes. No cranial nerve deficit.  Skin: Skin is warm and dry.   ED Course  Procedures (including critical care time) Labs Review Labs Reviewed  CBC WITH DIFFERENTIAL/PLATELET - Abnormal; Notable for the following:    WBC 23.5 (*)    RBC 3.65 (*)    Hemoglobin 11.0 (*)    HCT 31.6 (*)  Platelets 459 (*)    Neutrophils Relative % 91 (*)    Neutro Abs 21.3 (*)    Lymphocytes Relative 4 (*)    Monocytes Absolute 1.2 (*)    All other components within normal limits  BASIC METABOLIC PANEL - Abnormal; Notable for the following:    Sodium 121 (*)    Chloride 85 (*)    Glucose, Bld 472 (*)    Calcium 8.7 (*)    All other components within normal limits  SEDIMENTATION RATE - Abnormal; Notable for the following:    Sed Rate 118 (*)    All other components within normal limits  C-REACTIVE PROTEIN - Abnormal; Notable for the following:    CRP 30.3 (*)    All other components within normal limits  COMPREHENSIVE METABOLIC PANEL - Abnormal; Notable for the following:    Sodium 125 (*)    Chloride 87 (*)    Glucose, Bld 314 (*)    Calcium 8.6 (*)    Total Protein 5.4 (*)    Albumin 1.9 (*)    AST 13 (*)    Alkaline Phosphatase 151 (*)    All other components within normal limits  CBC WITH DIFFERENTIAL/PLATELET - Abnormal; Notable for the following:    WBC 19.0 (*)    RBC 3.38 (*)    Hemoglobin 10.2 (*)    HCT  29.3 (*)    Platelets 423 (*)    Neutrophils Relative % 87 (*)    Neutro Abs 16.4 (*)    Lymphocytes Relative 7 (*)    All other components within normal limits  GLUCOSE, CAPILLARY - Abnormal; Notable for the following:    Glucose-Capillary 425 (*)    All other components within normal limits  GLUCOSE, CAPILLARY - Abnormal; Notable for the following:    Glucose-Capillary 258 (*)    All other components within normal limits  SYNOVIAL CELL COUNT + DIFF, W/ CRYSTALS - Abnormal; Notable for the following:    Appearance-Synovial TURBID (*)    WBC, Synovial 19000 (*)    Neutrophil, Synovial 98 (*)    Monocyte-Macrophage-Synovial Fluid 0 (*)    All other components within normal limits  C-REACTIVE PROTEIN - Abnormal; Notable for the following:    CRP 25.6 (*)    All other components within normal limits  SEDIMENTATION RATE - Abnormal; Notable for the following:    Sed Rate 110 (*)    All other components within normal limits  GLUCOSE, CAPILLARY - Abnormal; Notable for the following:    Glucose-Capillary 217 (*)    All other components within normal limits  BODY FLUID CULTURE  SURGICAL PCR SCREEN  MRSA PCR SCREENING  HEMOGLOBIN A1C  I-STAT CG4 LACTIC ACID, ED  I-STAT CG4 LACTIC ACID, ED    Imaging Review Dg Knee 2 Views Right  06/14/2015   CLINICAL DATA:  Right knee pain and swelling. Elevated white blood cell count. History of right knee surgery 2011, 5 years prior.  EXAM: RIGHT KNEE - 1-2 VIEW  COMPARISON:  None.  FINDINGS: Post unilateral medial compartment hemiarthroplasty. There is less than 2 mm lucency adjacent to the central aspect of the tibial component. No acute fracture or dislocation. No erosion or periosteal reaction. Subchondral irregularity involving the patellofemoral articulation consistent with osteoarthritis.  Large heterogeneous joint effusion containing questionable foci of air. Small lucent foci posterior to the distal femoral metaphysis may reflect air within  posterior joint fluid. Additional lucent foci projecting in the posterior proximal lower leg.  These foci in may reflect soft tissue air versus less likely confluent edema. Diffuse soft tissue edema.  IMPRESSION: Large joint effusion with questionable foci of intra-articular air, raising concern for septic joint. Probable foci of soft tissue air posterior to the distal femur as well as proximal lower leg. This may reflect air within joint effusion and elongated or ruptured Baker cyst versus less likely foci of more confluent soft tissue edema. No osseous destructive change.  These results were called by telephone at the time of interpretation on 06/14/2015 at 11:50 pm to Dr. Geronimo Boot , who verbally acknowledged these results.   Electronically Signed   By: Jeb Levering M.D.   On: 06/14/2015 23:52   Dg Hand 2 View Left  06/14/2015   CLINICAL DATA:  Left ring finger swelling.  EXAM: LEFT HAND - 2 VIEW  COMPARISON:  None.  FINDINGS: Lateral view limited due to osseous overlap. Soft tissue edema about the ring finger. No evidence of fracture, dislocation, or bony destructive change. Multifocal osteoarthritis throughout the digits.  IMPRESSION: Soft tissue edema about the ring finger. No evidence of associated osseous abnormality allowing for limitations.   Electronically Signed   By: Jeb Levering M.D.   On: 06/14/2015 23:53     EKG Interpretation   Date/Time:  Monday June 14 2015 22:14:06 EDT Ventricular Rate:  105 PR Interval:  139 QRS Duration: 76 QT Interval:  310 QTC Calculation: 410 R Axis:   65 Text Interpretation:  Sinus tachycardia Probable left atrial enlargement  Low voltage, precordial leads ED PHYSICIAN INTERPRETATION AVAILABLE IN  [facility] Confirmed by TEST, Record ([id number]) on 06/15/2015 7:39:24 AM      MDM   Final diagnoses:  Cellulitis of right lower extremity    On initial evaluation pt in mild distress and tachycardic in the low 100s.  DVT ultrasound  performed while in triage showing possible Bakers cyst rupture. No frank DVTs or SVTs present on ultrasound. CBC performed showing a leukocytosis of 23.  Sodium was 120 with glucose in 470s. Corrected Na of 130.  Continued tachycardia and given liter bolus. Right lower extremity evaluated and erythematous and warm. X-ray performed showing possible gas in tissue and discussed findings with radiologist. These findings were discussed with Dr. Olevia Bowens and broad-spectrum antibiotics started.  ESR and CRP pending.  Cellulitis of RLE or possible septic joint.  Possible gas in tissue concerning for necrotizing fasciitis. Discussed with Dr. Olevia Bowens who will consult orthopedics as inpatient.     If performed, labs, EKGs, and imaging were reviewed/interpreted by myself and my attending and incorporated into medical decision making.  Discussed pertinent finding with patient or caregiver prior to admission with no further questions.  Pt care supervised by my attending Dr. Tawnya Crook.  Geronimo Boot, MD PGY-2  Emergency Medicine    Geronimo Boot, MD 06/15/15 [place], MD 06/18/15 Pryor Curia

## 2015-06-14 NOTE — Progress Notes (Addendum)
VASCULAR LAB PRELIMINARY  PRELIMINARY  PRELIMINARY  PRELIMINARY  Right lower extremity venous Doppler completed.    Preliminary report:  There is no DVT or SVT noted in the right lower extremity.  Fluid extending from a structure with mixed echoes noted within the popliteal fossa throughout calf, could possibly be consistent with a rupturing Baker's cyst. There are enlarged lymph nodes noted in the right groin.   , , RVT 06/14/2015, 6:03 PM

## 2015-06-15 ENCOUNTER — Encounter (HOSPITAL_COMMUNITY)
Admission: EM | Disposition: A | Discharge status: Skilled Nursing Facility | Payer: Self-pay | Source: Home / Self Care | Attending: Internal Medicine

## 2015-06-15 ENCOUNTER — Inpatient Hospital Stay (HOSPITAL_COMMUNITY): Payer: Medicare Other | Admitting: Anesthesiology

## 2015-06-15 ENCOUNTER — Encounter (HOSPITAL_COMMUNITY): Payer: Self-pay | Admitting: Orthopedic Surgery

## 2015-06-15 ENCOUNTER — Inpatient Hospital Stay (HOSPITAL_COMMUNITY): Payer: Medicare Other

## 2015-06-15 DIAGNOSIS — L03115 Cellulitis of right lower limb: Secondary | ICD-10-CM

## 2015-06-15 DIAGNOSIS — Z9889 Other specified postprocedural states: Secondary | ICD-10-CM

## 2015-06-15 DIAGNOSIS — E1165 Type 2 diabetes mellitus with hyperglycemia: Secondary | ICD-10-CM | POA: Diagnosis present

## 2015-06-15 DIAGNOSIS — N400 Enlarged prostate without lower urinary tract symptoms: Secondary | ICD-10-CM | POA: Diagnosis present

## 2015-06-15 DIAGNOSIS — I10 Essential (primary) hypertension: Secondary | ICD-10-CM | POA: Diagnosis present

## 2015-06-15 DIAGNOSIS — E039 Hypothyroidism, unspecified: Secondary | ICD-10-CM | POA: Diagnosis present

## 2015-06-15 DIAGNOSIS — IMO0002 Reserved for concepts with insufficient information to code with codable children: Secondary | ICD-10-CM | POA: Diagnosis present

## 2015-06-15 HISTORY — PX: I & D EXTREMITY: SHX5045

## 2015-06-15 HISTORY — PX: LUMBAR WOUND DEBRIDEMENT: SHX1988

## 2015-06-15 LAB — SYNOVIAL CELL COUNT + DIFF, W/ CRYSTALS
Crystals, Fluid: NONE SEEN
Eosinophils-Synovial: 0 % (ref 0–1)
LYMPHOCYTES-SYNOVIAL FLD: 2 % (ref 0–20)
Monocyte-Macrophage-Synovial Fluid: 0 % — ABNORMAL LOW (ref 50–90)
Neutrophil, Synovial: 98 % — ABNORMAL HIGH (ref 0–25)
WBC, Synovial: 19000 /mm3 — ABNORMAL HIGH (ref 0–200)

## 2015-06-15 LAB — SURGICAL PCR SCREEN
MRSA, PCR: NEGATIVE
Staphylococcus aureus: POSITIVE — AB

## 2015-06-15 LAB — CBC WITH DIFFERENTIAL/PLATELET
BASOS PCT: 0 % (ref 0–1)
Basophils Absolute: 0 10*3/uL (ref 0.0–0.1)
Eosinophils Absolute: 0.1 10*3/uL (ref 0.0–0.7)
Eosinophils Relative: 1 % (ref 0–5)
HCT: 29.3 % — ABNORMAL LOW (ref 39.0–52.0)
Hemoglobin: 10.2 g/dL — ABNORMAL LOW (ref 13.0–17.0)
LYMPHS ABS: 1.4 10*3/uL (ref 0.7–4.0)
Lymphocytes Relative: 7 % — ABNORMAL LOW (ref 12–46)
MCH: 30.2 pg (ref 26.0–34.0)
MCHC: 34.8 g/dL (ref 30.0–36.0)
MCV: 86.7 fL (ref 78.0–100.0)
Monocytes Absolute: 1 10*3/uL (ref 0.1–1.0)
Monocytes Relative: 5 % (ref 3–12)
NEUTROS ABS: 16.4 10*3/uL — AB (ref 1.7–7.7)
NEUTROS PCT: 87 % — AB (ref 43–77)
PLATELETS: 423 10*3/uL — AB (ref 150–400)
RBC: 3.38 MIL/uL — ABNORMAL LOW (ref 4.22–5.81)
RDW: 13.9 % (ref 11.5–15.5)
WBC: 19 10*3/uL — AB (ref 4.0–10.5)

## 2015-06-15 LAB — COMPREHENSIVE METABOLIC PANEL
ALT: 23 U/L (ref 17–63)
ANION GAP: 9 (ref 5–15)
AST: 13 U/L — AB (ref 15–41)
Albumin: 1.9 g/dL — ABNORMAL LOW (ref 3.5–5.0)
Alkaline Phosphatase: 151 U/L — ABNORMAL HIGH (ref 38–126)
BUN: 11 mg/dL (ref 6–20)
CALCIUM: 8.6 mg/dL — AB (ref 8.9–10.3)
CO2: 29 mmol/L (ref 22–32)
CREATININE: 0.98 mg/dL (ref 0.61–1.24)
Chloride: 87 mmol/L — ABNORMAL LOW (ref 101–111)
GFR calc Af Amer: >60 mL/min (ref >60)
GFR calc non Af Amer: >60 mL/min (ref >60)
GLUCOSE: 314 mg/dL — AB (ref 65–99)
POTASSIUM: 4.7 mmol/L (ref 3.5–5.1)
Sodium: 125 mmol/L — ABNORMAL LOW (ref 135–145)
Total Bilirubin: 0.7 mg/dL (ref 0.3–1.2)
Total Protein: 5.4 g/dL — ABNORMAL LOW (ref 6.5–8.1)

## 2015-06-15 LAB — GLUCOSE, CAPILLARY
GLUCOSE-CAPILLARY: 180 mg/dL — AB (ref 65–99)
GLUCOSE-CAPILLARY: 258 mg/dL — AB (ref 65–99)
GLUCOSE-CAPILLARY: 425 mg/dL — AB (ref 65–99)
Glucose-Capillary: 217 mg/dL — ABNORMAL HIGH (ref 65–99)
Glucose-Capillary: 152 mg/dL — ABNORMAL HIGH (ref 65–99)

## 2015-06-15 LAB — C-REACTIVE PROTEIN: CRP: 25.6 mg/dL — AB (ref <1.0)

## 2015-06-15 LAB — SEDIMENTATION RATE: Sed Rate: 110 mm/hr — ABNORMAL HIGH (ref 0–16)

## 2015-06-15 SURGERY — IRRIGATION AND DEBRIDEMENT EXTREMITY
Anesthesia: General | Site: Knee | Laterality: Right

## 2015-06-15 MED ORDER — MENTHOL 3 MG MT LOZG: 1.0000 | LOZENGE | OROMUCOSAL | Status: DC | PRN

## 2015-06-15 MED ORDER — HYDROMORPHONE HCL 1 MG/ML IJ SOLN
0.2500 mg | INTRAMUSCULAR | Status: DC | PRN
  Administered 2015-06-15 (×3): 0.25 mg via INTRAVENOUS

## 2015-06-15 MED ORDER — ONDANSETRON HCL 4 MG PO TABS: 4.0000 mg | ORAL_TABLET | Freq: Four times a day (QID) | ORAL | Status: DC | PRN

## 2015-06-15 MED ORDER — SODIUM CHLORIDE 0.9 % IR SOLN
Status: DC | PRN
  Administered 2015-06-15 (×2): 500 mL

## 2015-06-15 MED ORDER — ONDANSETRON HCL 4 MG/2ML IJ SOLN
INTRAMUSCULAR | Status: DC | PRN
  Administered 2015-06-15: 4 mg via INTRAVENOUS

## 2015-06-15 MED ORDER — ALUM & MAG HYDROXIDE-SIMETH 200-200-20 MG/5ML PO SUSP
30.0000 mL | ORAL | Status: DC | PRN
Start: 2015-06-15 — End: 2015-06-18

## 2015-06-15 MED ORDER — ENOXAPARIN SODIUM 40 MG/0.4ML ~~LOC~~ SOLN
40.0000 mg | SUBCUTANEOUS | Status: DC
  Administered 2015-06-15: 40 mg via SUBCUTANEOUS
  Filled 2015-06-15: qty 0.4

## 2015-06-15 MED ORDER — INSULIN ASPART 100 UNIT/ML ~~LOC~~ SOLN
0.0000 [IU] | Freq: Three times a day (TID) | SUBCUTANEOUS | Status: DC
  Administered 2015-06-15: 3 [IU] via SUBCUTANEOUS
  Administered 2015-06-15 – 2015-06-16 (×2): 5 [IU] via SUBCUTANEOUS
  Administered 2015-06-16: 3 [IU] via SUBCUTANEOUS
  Administered 2015-06-17 (×2): 5 [IU] via SUBCUTANEOUS
  Administered 2015-06-17: 2 [IU] via SUBCUTANEOUS
  Administered 2015-06-18 (×2): 3 [IU] via SUBCUTANEOUS

## 2015-06-15 MED ORDER — METHOCARBAMOL 1000 MG/10ML IJ SOLN
500.0000 mg | Freq: Four times a day (QID) | INTRAVENOUS | Status: DC | PRN
  Filled 2015-06-15: qty 5

## 2015-06-15 MED ORDER — PIPERACILLIN-TAZOBACTAM 3.375 G IVPB
3.3750 g | Freq: Three times a day (TID) | INTRAVENOUS | Status: DC
  Administered 2015-06-15 – 2015-06-16 (×5): 3.375 g via INTRAVENOUS
  Filled 2015-06-15 (×7): qty 50

## 2015-06-15 MED ORDER — PREGABALIN 75 MG PO CAPS: 150.0000 mg | ORAL_CAPSULE | Freq: Two times a day (BID) | ORAL | Status: DC

## 2015-06-15 MED ORDER — DICLOFENAC SODIUM 75 MG PO TBEC
75.0000 mg | DELAYED_RELEASE_TABLET | Freq: Two times a day (BID) | ORAL | Status: DC
  Administered 2015-06-15 – 2015-06-16 (×3): 75 mg via ORAL
  Filled 2015-06-15 (×5): qty 1

## 2015-06-15 MED ORDER — VANCOMYCIN HCL IN DEXTROSE 1-5 GM/200ML-% IV SOLN: 1000.0000 mg | Freq: Two times a day (BID) | INTRAVENOUS | Status: DC

## 2015-06-15 MED ORDER — ROCURONIUM BROMIDE 100 MG/10ML IV SOLN
INTRAVENOUS | Status: DC | PRN
  Administered 2015-06-15 (×3): 10 mg via INTRAVENOUS
  Administered 2015-06-15: 30 mg via INTRAVENOUS
  Administered 2015-06-15: 10 mg via INTRAVENOUS

## 2015-06-15 MED ORDER — SODIUM CHLORIDE 0.9 % IJ SOLN
3.0000 mL | Freq: Two times a day (BID) | INTRAMUSCULAR | Status: DC
  Administered 2015-06-15: 3 mL via INTRAVENOUS

## 2015-06-15 MED ORDER — FINASTERIDE 5 MG PO TABS
5.0000 mg | ORAL_TABLET | Freq: Every day | ORAL | Status: DC
  Administered 2015-06-15 – 2015-06-18 (×4): 5 mg via ORAL
  Filled 2015-06-15 (×4): qty 1

## 2015-06-15 MED ORDER — LISINOPRIL-HYDROCHLOROTHIAZIDE 20-25 MG PO TABS: 1.0000 | ORAL_TABLET | Freq: Every day | ORAL | Status: DC

## 2015-06-15 MED ORDER — FENTANYL CITRATE (PF) 250 MCG/5ML IJ SOLN
INTRAMUSCULAR | Status: AC
  Filled 2015-06-15: qty 5

## 2015-06-15 MED ORDER — VANCOMYCIN HCL 10 G IV SOLR
1250.0000 mg | Freq: Two times a day (BID) | INTRAVENOUS | Status: DC
  Administered 2015-06-15 – 2015-06-17 (×5): 1250 mg via INTRAVENOUS
  Filled 2015-06-15 (×6): qty 1250

## 2015-06-15 MED ORDER — SODIUM CHLORIDE 0.45 % IV SOLN
INTRAVENOUS | Status: DC
  Administered 2015-06-15: 03:00:00 via INTRAVENOUS

## 2015-06-15 MED ORDER — GLYCOPYRROLATE 0.2 MG/ML IJ SOLN
INTRAMUSCULAR | Status: DC | PRN
  Administered 2015-06-15: 0.6 mg via INTRAVENOUS

## 2015-06-15 MED ORDER — INSULIN ASPART 100 UNIT/ML ~~LOC~~ SOLN
0.0000 [IU] | Freq: Three times a day (TID) | SUBCUTANEOUS | Status: DC
  Administered 2015-06-15: 8 [IU] via SUBCUTANEOUS

## 2015-06-15 MED ORDER — DOCUSATE SODIUM 100 MG PO CAPS
100.0000 mg | ORAL_CAPSULE | Freq: Two times a day (BID) | ORAL | Status: DC
  Administered 2015-06-16 – 2015-06-18 (×4): 100 mg via ORAL
  Filled 2015-06-15 (×5): qty 1

## 2015-06-15 MED ORDER — INSULIN GLARGINE 100 UNIT/ML ~~LOC~~ SOLN
40.0000 [IU] | Freq: Every day | SUBCUTANEOUS | Status: DC
  Administered 2015-06-15 – 2015-06-16 (×2): 40 [IU] via SUBCUTANEOUS
  Filled 2015-06-15 (×2): qty 0.4

## 2015-06-15 MED ORDER — SUCCINYLCHOLINE CHLORIDE 20 MG/ML IJ SOLN
INTRAMUSCULAR | Status: AC
  Filled 2015-06-15: qty 1

## 2015-06-15 MED ORDER — MAGNESIUM CITRATE PO SOLN: 1.0000 | Freq: Once | ORAL | Status: AC | PRN

## 2015-06-15 MED ORDER — SODIUM CHLORIDE 0.9 % IV SOLN: 250.0000 mL | INTRAVENOUS | Status: DC | PRN

## 2015-06-15 MED ORDER — LACTATED RINGERS IV SOLN
INTRAVENOUS | Status: DC | PRN
  Administered 2015-06-15 (×3): via INTRAVENOUS

## 2015-06-15 MED ORDER — PREGABALIN 75 MG PO CAPS
150.0000 mg | ORAL_CAPSULE | Freq: Two times a day (BID) | ORAL | Status: DC
  Administered 2015-06-15 – 2015-06-18 (×8): 150 mg via ORAL
  Filled 2015-06-15 (×8): qty 2

## 2015-06-15 MED ORDER — ARTIFICIAL TEARS OP OINT
TOPICAL_OINTMENT | OPHTHALMIC | Status: AC
  Filled 2015-06-15: qty 7

## 2015-06-15 MED ORDER — SODIUM CHLORIDE 0.9 % IV SOLN: INTRAVENOUS | Status: DC

## 2015-06-15 MED ORDER — PHENOL 1.4 % MT LIQD
1.0000 | OROMUCOSAL | Status: DC | PRN
Start: 2015-06-15 — End: 2015-06-16

## 2015-06-15 MED ORDER — SIMVASTATIN 20 MG PO TABS
20.0000 mg | ORAL_TABLET | Freq: Every day | ORAL | Status: DC
  Administered 2015-06-15 – 2015-06-18 (×4): 20 mg via ORAL
  Filled 2015-06-15 (×4): qty 1

## 2015-06-15 MED ORDER — FENTANYL CITRATE (PF) 100 MCG/2ML IJ SOLN
INTRAMUSCULAR | Status: DC | PRN
  Administered 2015-06-15 (×7): 50 ug via INTRAVENOUS

## 2015-06-15 MED ORDER — ONDANSETRON HCL 4 MG/2ML IJ SOLN
INTRAMUSCULAR | Status: AC
  Filled 2015-06-15: qty 8

## 2015-06-15 MED ORDER — LIDOCAINE HCL (CARDIAC) 20 MG/ML IV SOLN
INTRAVENOUS | Status: DC | PRN
  Administered 2015-06-15: 80 mg via INTRAVENOUS

## 2015-06-15 MED ORDER — INSULIN ASPART 100 UNIT/ML ~~LOC~~ SOLN
8.0000 [IU] | Freq: Once | SUBCUTANEOUS | Status: AC
  Administered 2015-06-15: 8 [IU] via SUBCUTANEOUS

## 2015-06-15 MED ORDER — NEOSTIGMINE METHYLSULFATE 10 MG/10ML IV SOLN
INTRAVENOUS | Status: DC | PRN
  Administered 2015-06-15: 4 mg via INTRAVENOUS

## 2015-06-15 MED ORDER — PHENYLEPHRINE HCL 10 MG/ML IJ SOLN
INTRAMUSCULAR | Status: DC | PRN
  Administered 2015-06-15 (×4): 40 ug via INTRAVENOUS

## 2015-06-15 MED ORDER — SUCCINYLCHOLINE CHLORIDE 20 MG/ML IJ SOLN
INTRAMUSCULAR | Status: DC | PRN
Start: 2015-06-15 — End: 2015-06-15
  Administered 2015-06-15: 100 mg via INTRAVENOUS

## 2015-06-15 MED ORDER — ENOXAPARIN SODIUM 30 MG/0.3ML ~~LOC~~ SOLN
30.0000 mg | Freq: Two times a day (BID) | SUBCUTANEOUS | Status: DC
  Administered 2015-06-16 – 2015-06-18 (×5): 30 mg via SUBCUTANEOUS
  Filled 2015-06-15 (×6): qty 0.3

## 2015-06-15 MED ORDER — THROMBIN 5000 UNITS EX SOLR
CUTANEOUS | Status: DC | PRN
  Administered 2015-06-15: 5000 [IU] via TOPICAL

## 2015-06-15 MED ORDER — HYDROMORPHONE HCL 1 MG/ML IJ SOLN
INTRAMUSCULAR | Status: AC
  Filled 2015-06-15: qty 1

## 2015-06-15 MED ORDER — SENNA 8.6 MG PO TABS
1.0000 | ORAL_TABLET | Freq: Two times a day (BID) | ORAL | Status: DC
  Administered 2015-06-16 – 2015-06-18 (×3): 8.6 mg via ORAL
  Filled 2015-06-15 (×5): qty 1

## 2015-06-15 MED ORDER — SODIUM CHLORIDE 0.9 % IV SOLN
INTRAVENOUS | Status: DC
  Administered 2015-06-15: 1000 mL via INTRAVENOUS

## 2015-06-15 MED ORDER — VANCOMYCIN HCL 1000 MG IV SOLR
1000.0000 mg | INTRAVENOUS | Status: DC | PRN
  Administered 2015-06-15: 1000 mg via INTRAVENOUS

## 2015-06-15 MED ORDER — PHENYLEPHRINE 40 MCG/ML (10ML) SYRINGE FOR IV PUSH (FOR BLOOD PRESSURE SUPPORT)
PREFILLED_SYRINGE | INTRAVENOUS | Status: AC
  Filled 2015-06-15: qty 20

## 2015-06-15 MED ORDER — ONDANSETRON HCL 4 MG/2ML IJ SOLN: 4.0000 mg | Freq: Four times a day (QID) | INTRAMUSCULAR | Status: DC | PRN

## 2015-06-15 MED ORDER — MIDAZOLAM HCL 2 MG/2ML IJ SOLN
INTRAMUSCULAR | Status: AC
  Filled 2015-06-15: qty 2

## 2015-06-15 MED ORDER — METOCLOPRAMIDE HCL 5 MG/ML IJ SOLN
5.0000 mg | Freq: Three times a day (TID) | INTRAMUSCULAR | Status: DC | PRN
Start: 2015-06-15 — End: 2015-06-16

## 2015-06-15 MED ORDER — ALFUZOSIN HCL ER 10 MG PO TB24
10.0000 mg | ORAL_TABLET | Freq: Every day | ORAL | Status: DC
  Administered 2015-06-15 – 2015-06-18 (×4): 10 mg via ORAL
  Filled 2015-06-15 (×7): qty 1

## 2015-06-15 MED ORDER — POTASSIUM CHLORIDE IN NACL 20-0.45 MEQ/L-% IV SOLN
INTRAVENOUS | Status: DC
  Administered 2015-06-16: 01:00:00 via INTRAVENOUS
  Filled 2015-06-15 (×2): qty 1000

## 2015-06-15 MED ORDER — ARTIFICIAL TEARS OP OINT
TOPICAL_OINTMENT | OPHTHALMIC | Status: DC | PRN
  Administered 2015-06-15: 1 via OPHTHALMIC

## 2015-06-15 MED ORDER — ROCURONIUM BROMIDE 50 MG/5ML IV SOLN
INTRAVENOUS | Status: AC
  Filled 2015-06-15: qty 1

## 2015-06-15 MED ORDER — LISINOPRIL 20 MG PO TABS
20.0000 mg | ORAL_TABLET | Freq: Every day | ORAL | Status: DC
  Administered 2015-06-15: 20 mg via ORAL
  Filled 2015-06-15 (×2): qty 1

## 2015-06-15 MED ORDER — VANCOMYCIN HCL 10 G IV SOLR
1500.0000 mg | Freq: Once | INTRAVENOUS | Status: AC
  Administered 2015-06-15: 1500 mg via INTRAVENOUS
  Filled 2015-06-15: qty 1500

## 2015-06-15 MED ORDER — METHOCARBAMOL 500 MG PO TABS: 500.0000 mg | ORAL_TABLET | Freq: Four times a day (QID) | ORAL | Status: DC | PRN

## 2015-06-15 MED ORDER — ACETAMINOPHEN 325 MG PO TABS
650.0000 mg | ORAL_TABLET | Freq: Four times a day (QID) | ORAL | Status: DC | PRN
  Administered 2015-06-17: 650 mg via ORAL
  Filled 2015-06-15: qty 2

## 2015-06-15 MED ORDER — HYDROMORPHONE HCL 1 MG/ML IJ SOLN
1.0000 mg | INTRAMUSCULAR | Status: DC | PRN
  Administered 2015-06-16 – 2015-06-18 (×2): 1 mg via INTRAVENOUS
  Filled 2015-06-15 (×3): qty 1

## 2015-06-15 MED ORDER — POLYETHYLENE GLYCOL 3350 17 G PO PACK: 17.0000 g | PACK | Freq: Every day | ORAL | Status: DC | PRN

## 2015-06-15 MED ORDER — HYDROMORPHONE HCL 1 MG/ML IJ SOLN: 0.5000 mg | INTRAMUSCULAR | Status: DC | PRN

## 2015-06-15 MED ORDER — SODIUM CHLORIDE 0.9 % IR SOLN
Status: DC | PRN
  Administered 2015-06-15 (×2): 1000 mL
  Administered 2015-06-15: 9000 mL

## 2015-06-15 MED ORDER — ASPIRIN EC 81 MG PO TBEC
81.0000 mg | DELAYED_RELEASE_TABLET | Freq: Every day | ORAL | Status: DC
  Administered 2015-06-15 – 2015-06-18 (×4): 81 mg via ORAL
  Filled 2015-06-15 (×4): qty 1

## 2015-06-15 MED ORDER — SODIUM CHLORIDE 0.9 % IJ SOLN: 3.0000 mL | INTRAMUSCULAR | Status: DC | PRN

## 2015-06-15 MED ORDER — ACETAMINOPHEN 650 MG RE SUPP: 650.0000 mg | Freq: Four times a day (QID) | RECTAL | Status: DC | PRN

## 2015-06-15 MED ORDER — OXYCODONE HCL 5 MG PO TABS: 5.0000 mg | ORAL_TABLET | ORAL | Status: DC | PRN

## 2015-06-15 MED ORDER — PROPOFOL 10 MG/ML IV BOLUS
INTRAVENOUS | Status: DC | PRN
  Administered 2015-06-15: 100 mg via INTRAVENOUS

## 2015-06-15 MED ORDER — PROMETHAZINE HCL 25 MG/ML IJ SOLN
6.2500 mg | INTRAMUSCULAR | Status: DC | PRN
Start: 2015-06-15 — End: 2015-06-16

## 2015-06-15 MED ORDER — INSULIN ASPART 100 UNIT/ML ~~LOC~~ SOLN
0.0000 [IU] | Freq: Every day | SUBCUTANEOUS | Status: DC
  Administered 2015-06-16: 3 [IU] via SUBCUTANEOUS

## 2015-06-15 MED ORDER — DIPHENHYDRAMINE HCL 12.5 MG/5ML PO ELIX
12.5000 mg | ORAL_SOLUTION | ORAL | Status: DC | PRN
  Filled 2015-06-15: qty 10

## 2015-06-15 MED ORDER — TRAMADOL HCL 50 MG PO TABS
50.0000 mg | ORAL_TABLET | Freq: Four times a day (QID) | ORAL | Status: DC | PRN
  Administered 2015-06-15 – 2015-06-18 (×6): 50 mg via ORAL
  Filled 2015-06-15 (×6): qty 1

## 2015-06-15 MED ORDER — PHENYLEPHRINE 40 MCG/ML (10ML) SYRINGE FOR IV PUSH (FOR BLOOD PRESSURE SUPPORT)
PREFILLED_SYRINGE | INTRAVENOUS | Status: AC
  Filled 2015-06-15: qty 10

## 2015-06-15 MED ORDER — HYDROCHLOROTHIAZIDE 25 MG PO TABS
25.0000 mg | ORAL_TABLET | Freq: Every day | ORAL | Status: DC
  Administered 2015-06-15: 25 mg via ORAL
  Filled 2015-06-15: qty 1

## 2015-06-15 MED ORDER — LEVOTHYROXINE SODIUM 88 MCG PO TABS
88.0000 ug | ORAL_TABLET | Freq: Every day | ORAL | Status: DC
  Administered 2015-06-15 – 2015-06-18 (×4): 88 ug via ORAL
  Filled 2015-06-15 (×4): qty 1

## 2015-06-15 MED ORDER — METOCLOPRAMIDE HCL 10 MG PO TABS: 5.0000 mg | ORAL_TABLET | Freq: Three times a day (TID) | ORAL | Status: DC | PRN

## 2015-06-15 MED ORDER — HEMOSTATIC AGENTS (NO CHARGE) OPTIME
TOPICAL | Status: DC | PRN
  Administered 2015-06-15: 1 via TOPICAL

## 2015-06-15 MED ORDER — BISACODYL 10 MG RE SUPP: 10.0000 mg | Freq: Every day | RECTAL | Status: DC | PRN

## 2015-06-15 MED ORDER — SODIUM CHLORIDE 0.45 % IV SOLN: INTRAVENOUS | Status: DC

## 2015-06-15 SURGICAL SUPPLY — 78 items
BAG DECANTER FOR FLEXI CONT (MISCELLANEOUS) ×4 IMPLANT
BANDAGE ELASTIC 4 VELCRO ST LF (GAUZE/BANDAGES/DRESSINGS) ×4 IMPLANT
BANDAGE ELASTIC 6 VELCRO ST LF (GAUZE/BANDAGES/DRESSINGS) ×4 IMPLANT
BENZOIN TINCTURE PRP APPL 2/3 (GAUZE/BANDAGES/DRESSINGS) IMPLANT
BLADE 10 SAFETY STRL DISP (BLADE) ×4 IMPLANT
BLADE SURG ROTATE 9660 (MISCELLANEOUS) IMPLANT
BNDG COHESIVE 4X5 TAN STRL (GAUZE/BANDAGES/DRESSINGS) ×4 IMPLANT
BNDG GAUZE ELAST 4 BULKY (GAUZE/BANDAGES/DRESSINGS) ×4 IMPLANT
BOOTCOVER CLEANROOM LRG (PROTECTIVE WEAR) ×8 IMPLANT
BRUSH SCRUB EZ PLAIN DRY (MISCELLANEOUS) IMPLANT
CANISTER SUCT 3000ML PPV (MISCELLANEOUS) ×4 IMPLANT
CLEANER TIP ELECTROSURG 2X2 (MISCELLANEOUS) ×4 IMPLANT
CLOSURE WOUND 1/2 X4 (GAUZE/BANDAGES/DRESSINGS) ×2
CONT SPEC 4OZ CLIKSEAL STRL BL (MISCELLANEOUS) ×12 IMPLANT
COVER SURGICAL LIGHT HANDLE (MISCELLANEOUS) ×4 IMPLANT
CUFF TOURNIQUET SINGLE 34IN LL (TOURNIQUET CUFF) ×4 IMPLANT
DERMABOND ADVANCED (GAUZE/BANDAGES/DRESSINGS) ×2
DERMABOND ADVANCED .7 DNX12 (GAUZE/BANDAGES/DRESSINGS) ×2 IMPLANT
DRAPE LAPAROTOMY 100X72X124 (DRAPES) ×4 IMPLANT
DRAPE PED LAPAROTOMY (DRAPES) ×4 IMPLANT
DRSG OPSITE POSTOP 4X6 (GAUZE/BANDAGES/DRESSINGS) ×4 IMPLANT
DRSG PAD ABDOMINAL 8X10 ST (GAUZE/BANDAGES/DRESSINGS) ×4 IMPLANT
DRSG TELFA 3X8 NADH (GAUZE/BANDAGES/DRESSINGS) ×4 IMPLANT
DURAPREP 26ML APPLICATOR (WOUND CARE) ×8 IMPLANT
ELECT REM PT RETURN 9FT ADLT (ELECTROSURGICAL) ×4
ELECTRODE REM PT RTRN 9FT ADLT (ELECTROSURGICAL) ×2 IMPLANT
EVACUATOR 1/8 PVC DRAIN (DRAIN) ×8 IMPLANT
FACESHIELD STD STERILE (MASK) ×4 IMPLANT
GAUZE SPONGE 4X4 12PLY STRL (GAUZE/BANDAGES/DRESSINGS) ×4 IMPLANT
GAUZE SPONGE 4X4 16PLY XRAY LF (GAUZE/BANDAGES/DRESSINGS) IMPLANT
GAUZE XEROFORM 1X8 LF (GAUZE/BANDAGES/DRESSINGS) ×4 IMPLANT
GLOVE BIOGEL PI ORTHO PRO SZ8 (GLOVE) ×2
GLOVE ECLIPSE 8.0 STRL XLNG CF (GLOVE) ×4 IMPLANT
GLOVE EXAM NITRILE LRG STRL (GLOVE) IMPLANT
GLOVE EXAM NITRILE XL STR (GLOVE) IMPLANT
GLOVE EXAM NITRILE XS STR PU (GLOVE) IMPLANT
GLOVE ORTHO TXT STRL SZ7.5 (GLOVE) ×8 IMPLANT
GLOVE PI ORTHO PRO STRL SZ8 (GLOVE) ×2 IMPLANT
GLOVE SURG ORTHO 8.0 STRL STRW (GLOVE) ×12 IMPLANT
GLOVE SURG SS PI 7.5 STRL IVOR (GLOVE) ×4 IMPLANT
GLOVE SURG SS PI 8.5 STRL IVOR (GLOVE) ×2
GLOVE SURG SS PI 8.5 STRL STRW (GLOVE) ×2 IMPLANT
GOWN STRL REUS W/ TWL LRG LVL3 (GOWN DISPOSABLE) IMPLANT
GOWN STRL REUS W/ TWL XL LVL3 (GOWN DISPOSABLE) IMPLANT
GOWN STRL REUS W/TWL 2XL LVL3 (GOWN DISPOSABLE) ×4 IMPLANT
GOWN STRL REUS W/TWL LRG LVL3 (GOWN DISPOSABLE)
GOWN STRL REUS W/TWL XL LVL3 (GOWN DISPOSABLE)
HANDPIECE INTERPULSE COAX TIP (DISPOSABLE)
KIT BASIN OR (CUSTOM PROCEDURE TRAY) ×12 IMPLANT
KIT ROOM TURNOVER OR (KITS) ×8 IMPLANT
KNEE PART MENISCAL BEAR 7MM RT (Knees) ×4 IMPLANT
MANIFOLD NEPTUNE II (INSTRUMENTS) ×4 IMPLANT
NEEDLE 18GX1X1/2 (RX/OR ONLY) (NEEDLE) ×4 IMPLANT
NS IRRIG 1000ML POUR BTL (IV SOLUTION) ×8 IMPLANT
PACK LAMINECTOMY NEURO (CUSTOM PROCEDURE TRAY) ×8 IMPLANT
PACK ORTHO EXTREMITY (CUSTOM PROCEDURE TRAY) ×4 IMPLANT
PAD ARMBOARD 7.5X6 YLW CONV (MISCELLANEOUS) ×20 IMPLANT
SET HNDPC FAN SPRY TIP SCT (DISPOSABLE) IMPLANT
SPONGE LAP 18X18 X RAY DECT (DISPOSABLE) ×4 IMPLANT
SPONGE SURGIFOAM ABS GEL SZ50 (HEMOSTASIS) ×4 IMPLANT
STAPLER SKIN PROX WIDE 3.9 (STAPLE) ×4 IMPLANT
STOCKINETTE IMPERVIOUS 9X36 MD (GAUZE/BANDAGES/DRESSINGS) ×4 IMPLANT
STRIP CLOSURE SKIN 1/2X4 (GAUZE/BANDAGES/DRESSINGS) ×6 IMPLANT
SUT ETHILON 3 0 PS 1 (SUTURE) ×12 IMPLANT
SUT PROLENE 0 CT (SUTURE) ×8 IMPLANT
SUT VIC AB 2-0 OS6 18 (SUTURE) ×16 IMPLANT
SUT VIC AB 3-0 CP2 18 (SUTURE) ×4 IMPLANT
SWAB CULTURE LIQ STUART DBL (MISCELLANEOUS) ×4 IMPLANT
SYR 20CC LL (SYRINGE) ×4 IMPLANT
SYR 20ML ECCENTRIC (SYRINGE) ×4 IMPLANT
TOWEL OR 17X24 6PK STRL BLUE (TOWEL DISPOSABLE) ×8 IMPLANT
TOWEL OR 17X26 10 PK STRL BLUE (TOWEL DISPOSABLE) ×8 IMPLANT
TUBE ANAEROBIC SPECIMEN COL (MISCELLANEOUS) ×4 IMPLANT
TUBE CONNECTING 12'X1/4 (SUCTIONS) ×1
TUBE CONNECTING 12X1/4 (SUCTIONS) ×3 IMPLANT
UNDERPAD 30X30 INCONTINENT (UNDERPADS AND DIAPERS) ×4 IMPLANT
WATER STERILE IRR 1000ML POUR (IV SOLUTION) ×8 IMPLANT
YANKAUER SUCT BULB TIP NO VENT (SUCTIONS) ×4 IMPLANT

## 2015-06-15 NOTE — Progress Notes (Signed)
Pt alert and oriented x4 scheduled to have surgery this evening. He will be transported to OR ; no acute distress at this time. RN called short stay for report. Will continue to monitor.

## 2015-06-15 NOTE — Progress Notes (Signed)
Utilization Review completed.   RN BSN CM 

## 2015-06-15 NOTE — Op Note (Addendum)
06/14/2015 - 06/15/2015  9:05 PM  PATIENT:  David Mathews    PRE-OPERATIVE DIAGNOSIS:  Right prosthetic knee joint infection, unicompartmental arthroplasty  POST-OPERATIVE DIAGNOSIS:  Same  PROCEDURE:  IRRIGATION AND DEBRIDEMENT OF INFECTED PARTIAL RIGHT  KNEE AND POLY EXCHANGE/revision, RIGHT KNEE SYNOVECTOMY, using a scalpel, rongeur, pickups, and a curette.  SURGEON:  Eulas Post, MD  PHYSICIAN ASSISTANT: Janace Litten, OPA-C, present and scrubbed throughout the case, critical for completion in a timely fashion, and for retraction, instrumentation, and closure.  ANESTHESIA:   General  PREOPERATIVE INDICATIONS:  David Mathews is a  74 y.o. male who had bilateral partial knee replacement performed in 2010 and was overall doing reasonably well although had back surgery earlier this month. His back wound apparently hasn't had purulent drainage and is not healed, and he developed significant right lower extremity swelling and difficulty walking and preoperative aspiration of the right knee demonstrated 190,000 white blood cells. Sedimentation rate and CRP were also elevated.   Of particular note, he had previous episodes of right knee instability, and says that the knee shifted out of place a couple of times, and required some type of closed reduction, but this was early in his postoperative course, and admitted stabilized out and was back to reasonable function over the last couple of years.  The risks benefits and alternatives were discussed with the patient preoperatively including but not limited to the risks of infection, bleeding, nerve injury, cardiopulmonary complications, the need for revision surgery, among others, and the patient was willing to proceed. We also discussed the potential need for future excision arthroplasty with cement interposition spacer, the potential conversion to total knee replacement, among others.  OPERATIVE IMPLANTS: I took out a [employer]omet size 4  polyethylene insert and replaced this with a size 7 medium polyethylene.  OPERATIVE FINDINGS: There was gross purulence throughout the joint. The implants on the medial side more stable with the exception of the polyethylene. There was a nonabsorbable stitch in the medial soft tissue, which I'm suspicious represented a repair of a intraoperatively damaged MCL. He was very unstable to valgus testing, and the polyethylene was easily dislocated. I'm not sure if he was truly unstable because I could palpate his MCL, or possibly the polyethylene was simply undersized. The polyethylene demonstrated evidence for previous dislocation, as the posterior aspect of the polyethylene have evidence for previous impaction and plastic deformity.  There was also evidence of surface fracture.  The anterior cruciate ligament was intact and the PCL was intact and the lateral side was normal and there was a little bit of patellofemoral wear but not bad.  Once the 7 was in place, I actually had a reasonably normal feel for the stability of the knee, and although I had thought that the MCL was probably incompetent because the 4 fit so poorly, but after the 7 was in, I was actually pretty happy with the stability of it.  OPERATIVE PROCEDURE: The patient is brought to the operating room and placed in the supine position. Gen. anesthesia was administered. Antibiotics had artery been given. The right lower extremity was prepped and draped in usual sterile fashion. Time out was performed. The left leg was elevated and the tourniquet was inflated. Total tourniquet time was approximately one hour and 15 minutes. Incision was made through his previous incision and I continued the curvature along the medial incision to bring it midline proximally. I performed a arthrotomy and quadriceps tendon incision, and then performed a fairly aggressive  synovectomy throughout the superior, medial, and lateral superolateral region of the synovium. A  total of 4 specimens along with purulent joint fluid was sent. I sent both tissue as well as fluid. I used a rongeur as well as a curet to debride the synovial surfaces particularly in the lateral right gutter, which was slightly more difficult to access with the knife, I had performed a sharp dissection plane along the entirety the suprapatellar pouch as well as the medial compartment. I also excised the synovial sheath posterior to the patellar tendon. The anterior cruciate ligament was protected. I removed any synovial tissue from around the implants. The implants were aggressively tested and found to be stable. I then trialed the gap and a 7 fit the best. This did restore intrinsic stability to the knee.  I then irrigated a total of 9 L of fluid throughout all of the compartments of the knee, I changed gloves and then changed instruments and then placed the new polyethylene. This popped into place nicely. Again soft tissue tension was appropriate. The knee had full motion. I then placed deep drains, and repaired the capsule with proline followed by nylon for the skin. Sterile gauze was applied and the patient was then turned prone.  Dr. Aliene Beams was planning on proceeding with irrigation and debridement of his lumbar spine, as a completely separate procedure under a separate positioning and prep and drape. Please see his dictation for details.

## 2015-06-15 NOTE — Transfer of Care (Signed)
Immediate Anesthesia Transfer of Care Note  Patient: David Mathews  Procedure(s) Performed: Procedure(s): IRRIGATION AND DEBRIDEMENT OF INFECTED PARTIAL RIGHT  KNEE AND POLY EXCHANGE, RIGHT KNEE SYNOVECTOMY (Right) LUMBAR WOUND IRRIGATION AND DEBRIDEMENT (N/A)  Patient Location: PACU  Anesthesia Type:General  Level of Consciousness: awake, alert  and oriented  Airway & Oxygen Therapy: Patient Spontanous Breathing and Patient connected to nasal cannula oxygen  Post-op Assessment: Report given to RN and Post -op Vital signs reviewed and stable  Post vital signs: Reviewed and stable  Last Vitals:  Filed Vitals:   06/15/15 1325  BP: 93/49  Pulse: 85  Temp: 36.7 C  Resp: 20    Complications: No apparent anesthesia complications

## 2015-06-15 NOTE — Progress Notes (Signed)
Cell count came back with 190,000 cells, with a sedimentation rate of 110 and a C-reactive protein of 25. This is all extremely consistent with infected knee replacement, we are comfortable plan for a IND, poly-exchange, possible removal of implants depending on if they appear loose or not. Theoretically, if this infection is within a month's time, then polyethylene exchange may be a viable solution, however there is risk that he will need future cement interposition spacer.  Eulas Post, MD

## 2015-06-15 NOTE — Progress Notes (Signed)
  CBG 131  

## 2015-06-15 NOTE — Op Note (Signed)
Preop diagnosis: Nonhealing lumbar wound Postop diagnosis: Same with possible wound infection Procedure: Incision and irrigation and reclosure of lumbar wound Surgeon:   After being placed in the prone position the patient's back was prepped and draped in the usual sterile fashion. Previous lumbar incision was opened and immediately some superficial fluid was encountered. This was cultured and then washed out thoroughly. We opened the incision deep to the fascia but there was no evidence of a deep infectious collection. We placed a self-retaining retractor and then irrigated the wound copiously with anabolic and saline irrigation. Any bleeding was controlled with unipolar coagulation. An epidural drain was left in the epidural space and brought out through a separate stab wound incision. The wound was then closed in multiple layers of Vicryls on the muscle fascia subcutaneous and subcuticular tissues. Dermabond Steri-Strips were placed on the skin. A sterile dressing was then applied and the patient was extubated and taken to recovery room in stable condition.

## 2015-06-15 NOTE — Consult Note (Signed)
ORTHOPAEDIC CONSULTATION  REQUESTING PHYSICIAN: Leroy Sea, MD  Chief Complaint: Right knee swelling  HPI: David Mathews is a 74 y.o. male who complains of  right knee swelling that has been present for about a month. He recently had back surgery and apparently is still having some drainage from his back. He had bilateral partial knee replacements performed in [place] in December 2010 and did well after his surgery, although a couple of years ago he had some episodes of the right knee "giving way", not clear exactly what it was but it resolved. As a month ago he started developing swelling. His surgery was June 30. The right lower extremity is now significantly painful and he says that he cannot really bend it very well. He has difficulty ambulating. Rest makes it better, movement makes it worse. He denied fevers although said that he did have some degree of chills.  Past Medical History  Diagnosis Date  . Hypothyroidism   . Diabetes mellitus without complication   . Arthritis    Past Surgical History  Procedure Laterality Date  . Back surgery  10/2010  . Partial knee arthroplasty Bilateral 09/2009  . Carpal tunnel release Left [id number]  . Carpal tunnel release Right 2009   History   Social History  . Marital Status: Widowed    Spouse Name: N/A  . Number of Children: N/A  . Years of Education: N/A   Social History Main Topics  . Smoking status: Former Smoker -- 1.00 packs/day for 10 years    Types: Cigarettes    Quit date: 05/17/1999  . Smokeless tobacco: Not on file  . Alcohol Use: No  . Drug Use: No  . Sexual Activity: Not on file   Other Topics Concern  . None   Social History Narrative   History reviewed. No pertinent family history. No Known Allergies   Positive ROS: All other systems have been reviewed and were otherwise negative with the exception of those mentioned in the HPI and as above.  Physical Exam: General: Alert, no acute  distress Cardiovascular: His entire right lower extremity has fairly significant swelling. Positive pretibial edema. Respiratory: No cyanosis, no use of accessory musculature GI: No organomegaly, abdomen is soft and non-tender Skin: He has well-healed surgical wound over the right leg, no clear redness. Neurologic: Sensation intact distally Psychiatric: Patient is competent for consent with normal mood and affect Lymphatic: No axillary or cervical lymphadenopathy  MUSCULOSKELETAL: Right knee has extreme limitation in motion, he really won't bend it much at all. There is a positive effusion. EHL and FHL are intact.  Assessment: Right knee swelling concerning for septic joint, history of remote right partial knee replacement, question postoperative spine infection.   Plan: I would defer to the spine surgeons regarding the management of his back, I did not inspect his wound there. His right knee however is very concerning for possible sepsis. An aspirate his knee today, keep him nothing by mouth, he may need surgical washout with either a poly-exchange, or alternatively he may ultimately need cement interposition spacer with then conversion to total knee replacement. This is a severe serious problem that could lead to long-term osteomyelitis if in fact it is infected.  Preprocedure diagnosis: Right knee effusion, possible sepsis Postprocedure diagnosis: Same Procedure: Right knee aspirate Procedure details: After informed verbal consent was obtained the right superolateral portal was prepped with a dime and an 18-gauge needle was used to aspirate about 10 mL of turbid yellow fluid, it  did not appear like normal joint fluid. A Band-Aid was placed as well as a 4 x 4 gauze. He tolerated this well, and the fluid will be sent for stat Gram stain culture and sensitivity and analysis and cell count, I'll keep him nothing by mouth and possible surgery later tonight if positive.     Eulas Post,  MD Cell [phone]   06/15/2015 1:27 PM

## 2015-06-15 NOTE — H&P (Signed)
[facility] History and Physical  David Mathews WJX:[id number] DOB: 09/14/1941 DOA: 06/14/2015  Referring physician: Tery Sanfilippo, MD PCP: [name]   Chief Complaint: Right leg swelling.  HPI: David Mathews is a 74 y.o. male with below past medical history who comes to the ER referred by Dr. Theophilus Kinds after he went to his office for a post-op follow-up of back surgery for spinal stenosis that he had on June 30th. Once he was evaluated, he was sent to the emergency department for evaluation of DVT. The patient states that his right lower extremity has been progressively swelling worse over the past 7 days, along with decreased range of motion and tenderness. He denies any specific trauma to the area. He complains of chills, but denies fevers.  He is currently in no acute distress   Review of Systems:  Constitutional:  No weight loss, night sweats, Fevers,  Positive chills and fatigue.  HEENT:  No headaches, Difficulty swallowing,Tooth/dental problems,Sore throat,  No sneezing, itching, ear ache, nasal congestion, post nasal drip,  Cardio-vascular:  No chest pain, Orthopnea, PND, swelling in lower extremities, anasarca, dizziness, palpitations  GI:  No heartburn, indigestion, abdominal pain, nausea, vomiting, diarrhea, change in bowel habits, loss of appetite  Resp:  No shortness of breath with exertion or at rest. No excess mucus, no productive cough, No non-productive cough, No coughing up of blood.No change in color of mucus.No wheezing.No chest wall deformity  Skin:  no rash or lesions.  GU:  no dysuria, change in color of urine, no urgency or frequency. No flank pain.  Musculoskeletal:  Right knee joint pain, swelling, erythema decreased range of motion.  Positive back pain.  Psych:  No change in mood or affect. No depression or anxiety. No memory loss.   Past Medical History  Diagnosis Date  . Hypothyroidism   . Diabetes mellitus without  complication   . Arthritis    Past Surgical History  Procedure Laterality Date  . Back surgery  10/2010  . Partial knee arthroplasty Bilateral 09/2009  . Carpal tunnel release Left [id number]  . Carpal tunnel release Right 2009   Social History:  reports that he quit smoking about 16 years ago. His smoking use included Cigarettes. He has a 10 pack-year smoking history. He does not have any smokeless tobacco history on file. He reports that he does not drink alcohol or use illicit drugs.  No Known Allergies  History reviewed. No pertinent family history.   Prior to Admission medications   Medication Sig Start Date End Date Taking? Authorizing Provider  acetaminophen (TYLENOL) 500 MG tablet Take 1,000 mg by mouth every 8 (eight) hours as needed for mild pain.    Yes Historical Provider, MD  alfuzosin (UROXATRAL) 10 MG 24 hr tablet Take 10 mg by mouth daily with breakfast.   Yes Historical Provider, MD  aspirin EC 81 MG tablet Take 81 mg by mouth daily.   Yes Historical Provider, MD  Cholecalciferol (VITAMIN D PO) Take 1 tablet by mouth daily.   Yes Historical Provider, MD  Cyanocobalamin (VITAMIN B 12 PO) Take 1 tablet by mouth daily.   Yes Historical Provider, MD  diclofenac (VOLTAREN) 75 MG EC tablet Take 75 mg by mouth 2 (two) times daily.   Yes Historical Provider, MD  finasteride (PROSCAR) 5 MG tablet Take 5 mg by mouth daily.   Yes Historical Provider, MD  insulin aspart (NOVOLOG FLEXPEN) 100 UNIT/ML FlexPen Inject 6-10 Units into the skin 3 (three) times  daily with meals.    Yes Historical Provider, MD  Insulin Glargine (LANTUS SOLOSTAR) 100 UNIT/ML Solostar Pen Inject 40-45 Units into the skin daily. Per sliding scale   Yes Historical Provider, MD  levothyroxine (SYNTHROID, LEVOTHROID) 88 MCG tablet Take 88 mcg by mouth daily before breakfast.   Yes Historical Provider, MD  lisinopril-hydrochlorothiazide (PRINZIDE,ZESTORETIC) 20-25 MG per tablet Take 1 tablet by mouth daily.   Yes  Historical Provider, MD  MAGNESIUM PO Take 1 tablet by mouth daily.   Yes Historical Provider, MD  Multiple Vitamins-Minerals (MULTIVITAMIN PO) Take 1 tablet by mouth daily.   Yes Historical Provider, MD  Potassium 99 MG TABS Take 1 tablet by mouth 2 (two) times daily.   Yes Historical Provider, MD  pregabalin (LYRICA) 150 MG capsule Take 150 mg by mouth 2 (two) times daily.   Yes Historical Provider, MD  simvastatin (ZOCOR) 20 MG tablet Take 20 mg by mouth daily.   Yes Historical Provider, MD  traMADol (ULTRAM) 50 MG tablet Take 50 mg by mouth every 6 (six) hours as needed for moderate pain.   Yes Historical Provider, MD  diazepam (VALIUM) 5 MG tablet Take 1 tablet (5 mg total) by mouth every 6 (six) hours as needed for muscle spasms. Patient not taking: Reported on 06/14/2015 05/21/15   Julio Sicks, MD   Physical Exam: Filed Vitals:   06/14/15 2230 06/14/15 2345 06/15/15 0029 06/15/15 0032  BP: 133/70 134/70 120/78 124/71  Pulse: 102 108 104 102  Temp:   98.9 F (37.2 C)   TempSrc:   Oral   Resp: 18 20    SpO2: 98% 99% 97% 99%    Wt Readings from Last 3 Encounters:  05/20/15 89.359 kg (197 lb)  05/17/15 89.359 kg (197 lb)    General:  Appears calm and comfortable Eyes: PERRL, normal lids, irises & conjunctiva ENT: grossly normal hearing, lips & tongue Neck: no LAD, masses or thyromegaly Cardiovascular: RRR, no m/r/g.  Telemetry: SR, no arrhythmias  Respiratory: CTA bilaterally, no w/r/r. Normal respiratory effort. Abdomen: soft, ntnd Skin: no rash or induration seen on limited exam Musculoskeletal: Right lower extremity edema, erythema and calor extending the whole length of the extremity. It is tender to palpation. Unable to fully evaluate the back, but ER physicians to stay the wounds dressings have a discharge.  Psychiatric: grossly normal mood and affect, speech fluent and appropriate Neurologic: grossly non-focal.          Labs on Admission:  Basic Metabolic  Panel:  Recent Labs Lab 06/14/15 2152  NA 121  K 4.9  CL 85  CO2 25  GLUCOSE 472  BUN 9  CREATININE 1.02  CALCIUM 8.7   Liver Function Tests: No results for input(s): AST, ALT, ALKPHOS, BILITOT, PROT, ALBUMIN in the last 168 hours. No results for input(s): LIPASE, AMYLASE in the last 168 hours. No results for input(s): AMMONIA in the last 168 hours. CBC:  Recent Labs Lab 06/14/15 2152  WBC 23.5  NEUTROABS 21.3  HGB 11.0  HCT 31.6  MCV 86.6  PLT 459   Cardiac Enzymes: No results for input(s): CKTOTAL, CKMB, CKMBINDEX, TROPONINI in the last 168 hours.  BNP (last 3 results) No results for input(s): BNP in the last 8760 hours.  ProBNP (last 3 results) No results for input(s): PROBNP in the last 8760 hours.  CBG: No results for input(s): GLUCAP in the last 168 hours.  Radiological Exams on Admission: Dg Knee 2 Views Right  06/14/2015  CLINICAL DATA:  Right knee pain and swelling. Elevated white blood cell count. History of right knee surgery 2011, 5 years prior.  EXAM: RIGHT KNEE - 1-2 VIEW  COMPARISON:  None.  FINDINGS: Post unilateral medial compartment hemiarthroplasty. There is less than 2 mm lucency adjacent to the central aspect of the tibial component. No acute fracture or dislocation. No erosion or periosteal reaction. Subchondral irregularity involving the patellofemoral articulation consistent with osteoarthritis.  Large heterogeneous joint effusion containing questionable foci of air. Small lucent foci posterior to the distal femoral metaphysis may reflect air within posterior joint fluid. Additional lucent foci projecting in the posterior proximal lower leg. These foci in may reflect soft tissue air versus less likely confluent edema. Diffuse soft tissue edema.  IMPRESSION: Large joint effusion with questionable foci of intra-articular air, raising concern for septic joint. Probable foci of soft tissue air posterior to the distal femur as well as proximal lower  leg. This may reflect air within joint effusion and elongated or ruptured Baker cyst versus less likely foci of more confluent soft tissue edema. No osseous destructive change.  These results were called by telephone at the time of interpretation on 06/14/2015 at 11:50 pm to Dr. Tery Sanfilippo , who verbally acknowledged these results.   Electronically Signed   By: Rubye Oaks M.D.   On: 06/14/2015 23:52   Dg Hand 2 View Left  06/14/2015   CLINICAL DATA:  Left ring finger swelling.  EXAM: LEFT HAND - 2 VIEW  COMPARISON:  None.  FINDINGS: Lateral view limited due to osseous overlap. Soft tissue edema about the ring finger. No evidence of fracture, dislocation, or bony destructive change. Multifocal osteoarthritis throughout the digits.  IMPRESSION: Soft tissue edema about the ring finger. No evidence of associated osseous abnormality allowing for limitations.   Electronically Signed   By: Rubye Oaks M.D.   On: 06/14/2015 23:53    EKG: Independently reviewed.   Sinus tachycardia Probable left atrial enlargement Low voltage, precordial leads  Assessment/Plan Principal Problem:   Cellulitis of right leg Active Problems:   Spinal stenosis, lumbar   Hypothyroidism   Uncontrolled type 2 diabetes mellitus   HTN (hypertension)   BPH (benign prostatic hyperplasia)   History of back surgery   1. I will upgrade the IV antibiotic therapy to vancomycin and Zosyn given the fact that the patient had surgery recently. Please consult with orthopedist surgeon in the morning during business hours. 2. Uncontrolled type 2 diabetes with pseudohypernatremia will be managed with IV fluids and corrective insulin at this time. He is on a schedule CBG monitoring 4 times a day and coverage before meals. Consider adding coverage at bedtime as well if needed. Check hemoglobin A1c. 3. Continue all pertinent home medications.  Code Status: Full code. DVT Prophylaxis: Lovenox. Family Communication:   G[place], [place] [phone]  Disposition Plan: Home with outpatient follow-up.  Time spent: more than 70 minutes.  Bobette Mo [facility] Pager [phone]

## 2015-06-15 NOTE — Progress Notes (Signed)
Patient Demographics:    David Mathews, is a 74 y.o. male, DOB - 08-30-1941, ZOX:[id number]  Admit date - 06/14/2015   Admitting Physician Bobette Mo, MD  Outpatient Primary MD for the patient is [name]  LOS - 1   Chief Complaint  Patient presents with  . DVT    The patient said Dr. Theophilus Kinds sent him here for a possible DVT.  He does have leg swelling and redness.  He denies any injury to the right leg other than "straining" to sit on the toilet.          Subjective:    David Mathews today has, No headache, No chest pain, No abdominal pain - No Nausea, No new weakness tingling or numbness, No Cough - SOB.  Has pain in the right knee.   Assessment  & Plan :      1. Right knee swelling. Suspicious for septic arthritis in a prosthetic joint. Orthopedics called immediately in the morning, continue empiric anti-biotics. No DVT on ultrasound.   2. BPH. On alpha-blocker continue.    3. Dyslipidemia. Continue home dose statin.    4. Essential hypertension on ACE inhibitor continue for now.    5. Hypothyroidism. On Synthroid continued at home dose.    6. Hyponatremia. Place on normal saline and monitor.    7. DM type II. On Lantus and sliding scale continue to monitor  No results found for: HGBA1C  CBG (last 3)   Recent Labs  06/15/15 0237 06/15/15 0757  GLUCAP 425* 258*       Code Status : Full  Family Communication  : None present  Disposition Plan  : To be decided  Consults  :  Orthopedics  Procedures  :     DVT Prophylaxis  :  Lovenox    Lab Results  Component Value Date   PLT 423* 06/15/2015    Inpatient Medications  Scheduled Meds: . alfuzosin  10 mg Oral Q breakfast  . aspirin EC  81 mg Oral Daily  . diclofenac  75 mg Oral BID  WC  . enoxaparin (LOVENOX) injection  40 mg Subcutaneous Q24H  . finasteride  5 mg Oral Daily  . insulin aspart  0-15 Units Subcutaneous TID WC  . insulin glargine  40 Units Subcutaneous Daily  . levothyroxine  88 mcg Oral QAC breakfast  . lisinopril  20 mg Oral Daily  . piperacillin-tazobactam (ZOSYN)  IV  3.375 g Intravenous 3 times per day  . pregabalin  150 mg Oral BID  . simvastatin  20 mg Oral Daily  . vancomycin  1,250 mg Intravenous Q12H   Continuous Infusions: . sodium chloride     PRN Meds:.HYDROmorphone (DILAUDID) injection, ondansetron **OR** ondansetron (ZOFRAN) IV, traMADol  Antibiotics  :     Anti-infectives    Start     Dose/Rate Route Frequency Ordered Stop   06/15/15 1000  vancomycin (VANCOCIN) 1,250 mg in sodium chloride 0.9 % 250 mL IVPB     1,250 mg 166.7 mL/hr over 90 Minutes Intravenous Every 12 hours 06/15/15 0038     06/15/15 0115  piperacillin-tazobactam (ZOSYN) IVPB 3.375 g     3.375 g 12.5 mL/hr over 240 Minutes Intravenous 3 times per day 06/15/15 0032  06/15/15 0100  vancomycin (VANCOCIN) 1,500 mg in sodium chloride 0.9 % 500 mL IVPB     1,500 mg 250 mL/hr over 120 Minutes Intravenous  Once 06/15/15 0038 06/15/15 0358   06/15/15 0032  vancomycin (VANCOCIN) IVPB 1000 mg/200 mL premix  Status:  Discontinued     1,000 mg 200 mL/hr over 60 Minutes Intravenous Every 12 hours 06/15/15 0032 06/15/15 0036   06/14/15 2300  clindamycin (CLEOCIN) IVPB 600 mg     600 mg 100 mL/hr over 30 Minutes Intravenous  Once 06/14/15 2246 06/14/15 2337        Objective:   Filed Vitals:   06/15/15 0029 06/15/15 0032 06/15/15 0500 06/15/15 0530  BP: 120/78 124/71  114/55  Pulse: 104 102  92  Temp: 98.9 F (37.2 C)   98.1 F (36.7 C)  TempSrc: Oral   Oral  Resp:    16  Height:   5\' 9"  (1.753 m)   Weight:   88.5 kg (195 lb 1.7 oz)   SpO2: 97% 99%  99%    Wt Readings from Last 3 Encounters:  06/15/15 88.5 kg (195 lb 1.7 oz)  05/20/15 89.359 kg (197 lb)    05/17/15 89.359 kg (197 lb)     Intake/Output Summary (Last 24 hours) at 06/15/15 1027 Last data filed at 06/15/15 1012  Gross per 24 hour  Intake    180 ml  Output   1100 ml  Net   -920 ml     Physical Exam  Awake Alert, Oriented X 3, No new F.N deficits, Normal affect [place].AT,PERRAL Supple Neck,No JVD, No cervical lymphadenopathy appriciated.  Symmetrical Chest wall movement, Good air movement bilaterally, CTAB RRR,No Gallops,Rubs or new Murmurs, No Parasternal Heave +ve B.Sounds, Abd Soft, No tenderness, No organomegaly appriciated, No rebound - guarding or rigidity. No Cyanosis, Clubbing or edema, No new Rash or bruise R knee swollen and red    Data Review:   Micro Results No results found for this or any previous visit (from the past 240 hour(s)).  Radiology Reports Dg Lumbar Spine 2-3 Views  05/20/2015   CLINICAL DATA:  L2-L3 PLIF, extension of instrumentation  EXAM: DG C-ARM 61-120 MIN; LUMBAR SPINE - 2-3 VIEW  COMPARISON:  Two intraoperative C-arm fluoroscopic images were obtained.  FLUOROSCOPY TIME:  0 minutes 26 seconds  Images obtained: 2  FINDINGS: Pairs of pedicle screws are identified at adjacent levels of lumbar spine.  Bones demineralized.  Disc prostheses present.  Indwelling pedicle screws and bars from prior lower lumbar fusion are also identified.  Based on identification of a rib on the lateral view, these pedicle screws are likely at L2 and L3 though this is not definitive due to lack of landmarks and lack of visualization of the entire lumbar spine.  IMPRESSION: Prior lower lumbar fusion.  Single level lumbar fusion question L2-L3 as above.   Electronically Signed   By: Ulyses Southward M.D.   On: 05/20/2015 16:03   Dg Knee 2 Views Right  06/14/2015   CLINICAL DATA:  Right knee pain and swelling. Elevated white blood cell count. History of right knee surgery 2011, 5 years prior.  EXAM: RIGHT KNEE - 1-2 VIEW  COMPARISON:  None.  FINDINGS: Post unilateral medial  compartment hemiarthroplasty. There is less than 2 mm lucency adjacent to the central aspect of the tibial component. No acute fracture or dislocation. No erosion or periosteal reaction. Subchondral irregularity involving the patellofemoral articulation consistent with osteoarthritis.  Large heterogeneous joint effusion  containing questionable foci of air. Small lucent foci posterior to the distal femoral metaphysis may reflect air within posterior joint fluid. Additional lucent foci projecting in the posterior proximal lower leg. These foci in may reflect soft tissue air versus less likely confluent edema. Diffuse soft tissue edema.  IMPRESSION: Large joint effusion with questionable foci of intra-articular air, raising concern for septic joint. Probable foci of soft tissue air posterior to the distal femur as well as proximal lower leg. This may reflect air within joint effusion and elongated or ruptured Baker cyst versus less likely foci of more confluent soft tissue edema. No osseous destructive change.  These results were called by telephone at the time of interpretation on 06/14/2015 at 11:50 pm to Dr. Tery Sanfilippo , who verbally acknowledged these results.   Electronically Signed   By: Rubye Oaks M.D.   On: 06/14/2015 23:52   Dg Hand 2 View Left  06/14/2015   CLINICAL DATA:  Left ring finger swelling.  EXAM: LEFT HAND - 2 VIEW  COMPARISON:  None.  FINDINGS: Lateral view limited due to osseous overlap. Soft tissue edema about the ring finger. No evidence of fracture, dislocation, or bony destructive change. Multifocal osteoarthritis throughout the digits.  IMPRESSION: Soft tissue edema about the ring finger. No evidence of associated osseous abnormality allowing for limitations.   Electronically Signed   By: Rubye Oaks M.D.   On: 06/14/2015 23:53   Dg C-arm 1-60 Min  05/20/2015   CLINICAL DATA:  L2-L3 PLIF, extension of instrumentation  EXAM: DG C-ARM 61-120 MIN; LUMBAR SPINE - 2-3 VIEW   COMPARISON:  Two intraoperative C-arm fluoroscopic images were obtained.  FLUOROSCOPY TIME:  0 minutes 26 seconds  Images obtained: 2  FINDINGS: Pairs of pedicle screws are identified at adjacent levels of lumbar spine.  Bones demineralized.  Disc prostheses present.  Indwelling pedicle screws and bars from prior lower lumbar fusion are also identified.  Based on identification of a rib on the lateral view, these pedicle screws are likely at L2 and L3 though this is not definitive due to lack of landmarks and lack of visualization of the entire lumbar spine.  IMPRESSION: Prior lower lumbar fusion.  Single level lumbar fusion question L2-L3 as above.   Electronically Signed   By: Ulyses Southward M.D.   On: 05/20/2015 16:03     CBC  Recent Labs Lab 06/14/15 2152 06/15/15 0504  WBC 23.5* 19.0*  HGB 11.0* 10.2*  HCT 31.6* 29.3*  PLT 459* 423*  MCV 86.6 86.7  MCH 30.1 30.2  MCHC 34.8 34.8  RDW 13.7 13.9  LYMPHSABS 1.0 1.4  MONOABS 1.2* 1.0  EOSABS 0.1 0.1  BASOSABS 0.0 0.0    Chemistries   Recent Labs Lab 06/14/15 2152 06/15/15 0504  NA 121* 125*  K 4.9 4.7  CL 85* 87*  CO2 25 29  GLUCOSE 472* 314*  BUN 9 11  CREATININE 1.02 0.98  CALCIUM 8.7* 8.6*  AST  --  13*  ALT  --  23  ALKPHOS  --  151*  BILITOT  --  0.7   ------------------------------------------------------------------------------------------------------------------ estimated creatinine clearance is 73.9 mL/min (by C-G formula based on Cr of 0.98). ------------------------------------------------------------------------------------------------------------------ No results for input(s): HGBA1C in the last 72 hours. ------------------------------------------------------------------------------------------------------------------ No results for input(s): CHOL, HDL, LDLCALC, TRIG, CHOLHDL, LDLDIRECT in the last 72  hours. ------------------------------------------------------------------------------------------------------------------ No results for input(s): TSH, T4TOTAL, T3FREE, THYROIDAB in the last 72 hours.  Invalid input(s): FREET3 ------------------------------------------------------------------------------------------------------------------ No results for input(s): VITAMINB12,  FOLATE, FERRITIN, TIBC, IRON, RETICCTPCT in the last 72 hours.  Coagulation profile No results for input(s): INR, PROTIME in the last 168 hours.  No results for input(s): DDIMER in the last 72 hours.  Cardiac Enzymes No results for input(s): CKMB, TROPONINI, MYOGLOBIN in the last 168 hours.  Invalid input(s): CK ------------------------------------------------------------------------------------------------------------------ Invalid input(s): POCBNP   Time Spent in minutes   35   SINGH,PRASHANT K M.D on 06/15/2015 at 10:27 AM  Between 7am to 7pm - Pager - [phone]  After 7pm go to [email or web address] - password [facility]  [facility] -  Office  [phone]

## 2015-06-15 NOTE — Progress Notes (Signed)
ANTIBIOTIC CONSULT NOTE - INITIAL  Pharmacy Consult for Vancomycin and Zosyn  Indication: cellulitis  No Known Allergies  Patient Measurements:    Vital Signs: Temp: 98.9 F (37.2 C) (07/26 0029) Temp Source: Oral (07/26 0029) BP: 120/78 mmHg (07/26 0029) Pulse Rate: 104 (07/26 0029) Intake/Output from previous day:   Intake/Output from this shift:    Labs:  Recent Labs  06/14/15 2152  WBC 23.5*  HGB 11.0*  PLT 459*  CREATININE 1.02   CrCl cannot be calculated (Unknown ideal weight.). No results for input(s): VANCOTROUGH, VANCOPEAK, VANCORANDOM, GENTTROUGH, GENTPEAK, GENTRANDOM, TOBRATROUGH, TOBRAPEAK, TOBRARND, AMIKACINPEAK, AMIKACINTROU, AMIKACIN in the last 72 hours.   Microbiology: Recent Results (from the past 720 hour(s))  Surgical pcr screen     Status: Abnormal   Collection Time: 05/17/15 11:24 AM  Result Value Ref Range Status   MRSA, PCR NEGATIVE NEGATIVE Final   Staphylococcus aureus POSITIVE (A) NEGATIVE Final    Comment:        The Xpert SA Assay (FDA approved for NASAL specimens in patients over 13 years of age), is one component of a comprehensive surveillance program.  Test performance has been validated by [facility] for patients greater than or equal to 4 year old. It is not intended to diagnose infection nor to guide or monitor treatment.     Medical History: Past Medical History  Diagnosis Date  . Hypothyroidism   . Diabetes mellitus without complication   . Arthritis     Medications:  Prescriptions prior to admission  Medication Sig Dispense Refill Last Dose  . acetaminophen (TYLENOL) 500 MG tablet Take 1,000 mg by mouth every 8 (eight) hours as needed for mild pain.    06/14/2015 at Unknown time  . alfuzosin (UROXATRAL) 10 MG 24 hr tablet Take 10 mg by mouth daily with breakfast.   06/14/2015 at Unknown time  . aspirin EC 81 MG tablet Take 81 mg by mouth daily.   06/14/2015 at Unknown time  . Cholecalciferol (VITAMIN D PO)  Take 1 tablet by mouth daily.   Past Week at Unknown time  . Cyanocobalamin (VITAMIN B 12 PO) Take 1 tablet by mouth daily.   06/14/2015 at Unknown time  . diclofenac (VOLTAREN) 75 MG EC tablet Take 75 mg by mouth 2 (two) times daily.   06/14/2015 at Unknown time  . finasteride (PROSCAR) 5 MG tablet Take 5 mg by mouth daily.   06/13/2015 at Unknown time  . insulin aspart (NOVOLOG FLEXPEN) 100 UNIT/ML FlexPen Inject 6-10 Units into the skin 3 (three) times daily with meals.    06/14/2015 at Unknown time  . Insulin Glargine (LANTUS SOLOSTAR) 100 UNIT/ML Solostar Pen Inject 40-45 Units into the skin daily. Per sliding scale   06/14/2015 at Unknown time  . levothyroxine (SYNTHROID, LEVOTHROID) 88 MCG tablet Take 88 mcg by mouth daily before breakfast.   06/14/2015 at Unknown time  . lisinopril-hydrochlorothiazide (PRINZIDE,ZESTORETIC) 20-25 MG per tablet Take 1 tablet by mouth daily.   06/14/2015 at Unknown time  . MAGNESIUM PO Take 1 tablet by mouth daily.   06/14/2015 at Unknown time  . Multiple Vitamins-Minerals (MULTIVITAMIN PO) Take 1 tablet by mouth daily.   06/14/2015 at Unknown time  . Potassium 99 MG TABS Take 1 tablet by mouth 2 (two) times daily.   06/14/2015 at Unknown time  . pregabalin (LYRICA) 150 MG capsule Take 150 mg by mouth 2 (two) times daily.   06/14/2015 at Unknown time  . simvastatin (ZOCOR) 20 MG tablet  Take 20 mg by mouth daily.   06/13/2015 at Unknown time  . traMADol (ULTRAM) 50 MG tablet Take 50 mg by mouth every 6 (six) hours as needed for moderate pain.   06/14/2015 at Unknown time  . diazepam (VALIUM) 5 MG tablet Take 1 tablet (5 mg total) by mouth every 6 (six) hours as needed for muscle spasms. (Patient not taking: Reported on 06/14/2015) 50 tablet 0 Not Taking at Unknown time   Assessment: 74 y.o. male with RLE cellulitis for empiric antibiotics  Goal of Therapy:  Vancomycin trough level 10-15 mcg/ml  Plan:  Vancomycin 1500 mg IV now, then 1250 mg IV q12h Zosyn 3.375 g IV q8h    Eddie Candle 06/15/2015,12:33 AM

## 2015-06-15 NOTE — Care Management Note (Addendum)
Case Management Note  Patient Details  Name: TEJA JUDICE MRN: [id number] Date of Birth: [birth year]  Subjective/Objective:     NCM spoke with patient and son, Gaynelle Adu [phone]. Patient lives with son,  He stated pta he was indep.  Patient will need to go to surgery for I and D.  Will cont to follow to assess for hh needs.                  Action/Plan:   Expected Discharge Date:                  Expected Discharge Plan:  Home/Self Care  In-House Referral:     Discharge planning Services  CM Consult  Post Acute Care Choice:    Choice offered to:     DME Arranged:    DME Agency:     HH Arranged:    HH Agency:     Status of Service:  In process, will continue to follow  Medicare Important Message Given:    Date Medicare IM Given:    Medicare IM give by:    Date Additional Medicare IM Given:    Additional Medicare Important Message give by:     If discussed at Long Length of Stay Meetings, dates discussed:    Additional Comments:  Leone Haven, RN 06/15/2015, 5:13 PM

## 2015-06-15 NOTE — Anesthesia Preprocedure Evaluation (Addendum)
Anesthesia Evaluation  Patient identified by MRN, date of birth, ID band Patient awake    Reviewed: Allergy & Precautions, NPO status , Patient's Chart, lab work & pertinent test results  Airway Mallampati: III  TM Distance: <3 FB Neck ROM: Full    Dental  (+) Missing   Pulmonary neg pulmonary ROS, former smoker,  breath sounds clear to auscultation  Pulmonary exam normal       Cardiovascular hypertension, Normal cardiovascular examRhythm:Regular Rate:Normal     Neuro/Psych negative neurological ROS  negative psych ROS   GI/Hepatic negative GI ROS, Neg liver ROS,   Endo/Other  diabetes, Insulin DependentHypothyroidism   Renal/GU negative Renal ROS  negative genitourinary   Musculoskeletal negative musculoskeletal ROS (+)   Abdominal   Peds negative pediatric ROS (+)  Hematology negative hematology ROS (+)   Anesthesia Other Findings   Reproductive/Obstetrics negative OB ROS                            Anesthesia Physical Anesthesia Plan  ASA: III  Anesthesia Plan: General   Post-op Pain Management:    Induction: Intravenous  Airway Management Planned: Oral ETT  Additional Equipment:   Intra-op Plan:   Post-operative Plan: Extubation in OR  Informed Consent: I have reviewed the patients History and Physical, chart, labs and discussed the procedure including the risks, benefits and alternatives for the proposed anesthesia with the patient or authorized representative who has indicated his/her understanding and acceptance.   Dental advisory given  Plan Discussed with: CRNA and Surgeon  Anesthesia Plan Comments:         Anesthesia Quick Evaluation

## 2015-06-15 NOTE — Progress Notes (Signed)
Inpatient Diabetes Program Recommendations  AACE/ADA: New Consensus Statement on Inpatient Glycemic Control (2013)  Target Ranges:  Prepandial:   less than 140 mg/dL      Peak postprandial:   less than 180 mg/dL (1-2 hours)      Critically ill patients:  140 - 180 mg/dL   Reason for Visit: Hyperglycemia  Diabetes history: DM2 Outpatient Diabetes medications: Lantus 40-45 units QD, Novolog 6-10 units tidwc Current orders for Inpatient glycemic control: Lantus 40 units QD, Novolog moderate tidwc  Results for JESE, COMELLA (MRN [id number]) as of 06/15/2015 15:09  Ref. Range 06/15/2015 02:37 06/15/2015 07:57 06/15/2015 11:44  Glucose-Capillary Latest Ref Range: 65-99 mg/dL 782 (H) 956 (H) 213 (H)    Inpatient Diabetes Program Recommendations Insulin - Basal: Increase Lantus to 45 units QD Correction (SSI): Novolog moderate Q4H while NPO Insulin - Meal Coverage: Add meal coverage insulin - 4 units tidwc when diet advances to CHO mod med HgbA1C: Need updated HgbA1C to assess glycemic control prior to admission  Note: Will continue to follow. Thank you. Ailene Ards, RD, LDN, CDE Inpatient Diabetes Coordinator [phone]

## 2015-06-15 NOTE — Anesthesia Postprocedure Evaluation (Signed)
  Anesthesia Post-op Note  Patient: David Mathews  Procedure(s) Performed: Procedure(s) (LRB): IRRIGATION AND DEBRIDEMENT OF INFECTED PARTIAL RIGHT  KNEE AND POLY EXCHANGE, RIGHT KNEE SYNOVECTOMY (Right) LUMBAR WOUND IRRIGATION AND DEBRIDEMENT (N/A)  Patient Location: PACU  Anesthesia Type: General  Level of Consciousness: awake and alert   Airway and Oxygen Therapy: Patient Spontanous Breathing  Post-op Pain: mild  Post-op Assessment: Post-op Vital signs reviewed, Patient's Cardiovascular Status Stable, Respiratory Function Stable, Patent Airway and No signs of Nausea or vomiting  Last Vitals:  Filed Vitals:   06/15/15 2300  BP: 121/62  Pulse:   Temp:   Resp:     Post-op Vital Signs: stable   Complications: No apparent anesthesia complications

## 2015-06-15 NOTE — Progress Notes (Signed)
Patient ID: David Mathews, male   DOB: 06-08-1941, 74 y.o.   MRN: [id number] Appreciate medical care. Will see after surgery this morning.

## 2015-06-15 NOTE — Anesthesia Procedure Notes (Signed)
Procedure Name: Intubation Date/Time: 06/15/2015 7:38 PM Performed by: Julianne Rice Z Pre-anesthesia Checklist: Patient identified, Timeout performed, Emergency Drugs available, Suction available and Patient being monitored Patient Re-evaluated:Patient Re-evaluated prior to inductionOxygen Delivery Method: Circle system utilized Preoxygenation: Pre-oxygenation with 100% oxygen Intubation Type: IV induction Ventilation: Mask ventilation without difficulty Laryngoscope Size: Mac, 3 and Glidescope Grade View: Grade IV Tube size: 7.5 mm Number of attempts: 2 Airway Equipment and Method: Stylet and Video-laryngoscopy Placement Confirmation: ETT inserted through vocal cords under direct vision,  breath sounds checked- equal and bilateral and positive ETCO2 Secured at: 23 cm Tube secured with: Tape Dental Injury: Teeth and Oropharynx as per pre-operative assessment  Difficulty Due To: Difficult Airway- due to anterior larynx and Difficulty was anticipated Comments: Grade IV view with MAC 3; easily intubated with Glide scope (previous anesthetic with Grade 3 view)

## 2015-06-15 NOTE — Progress Notes (Signed)
Patient ID: David Mathews, male   DOB: [birth year], 74 y.o.   MRN: [id number] Afeb, vss Appreciate medical input. His wound is draining less today. DVT work up negative, and appears that this is more of a cellulitis. Ortho consulted re possible septic knee, and fluid removed. Will see if they need to do anything for his knee. If his wound does not stop draining soon, may need a wash out while here to put that behind [place]. For now, i will see if IV antibiotics handle the situation.  Will watch carefully for now. He does report that his leg feels somewhat better today than it did yesterday.

## 2015-06-16 ENCOUNTER — Encounter (HOSPITAL_COMMUNITY): Payer: Self-pay | Admitting: Orthopedic Surgery

## 2015-06-16 DIAGNOSIS — E1165 Type 2 diabetes mellitus with hyperglycemia: Secondary | ICD-10-CM

## 2015-06-16 DIAGNOSIS — Z96653 Presence of artificial knee joint, bilateral: Secondary | ICD-10-CM

## 2015-06-16 DIAGNOSIS — Z9889 Other specified postprocedural states: Secondary | ICD-10-CM

## 2015-06-16 DIAGNOSIS — T8453XA Infection and inflammatory reaction due to internal right knee prosthesis, initial encounter: Principal | ICD-10-CM

## 2015-06-16 DIAGNOSIS — Z981 Arthrodesis status: Secondary | ICD-10-CM

## 2015-06-16 DIAGNOSIS — T8450XD Infection and inflammatory reaction due to unspecified internal joint prosthesis, subsequent encounter: Secondary | ICD-10-CM

## 2015-06-16 DIAGNOSIS — B9689 Other specified bacterial agents as the cause of diseases classified elsewhere: Secondary | ICD-10-CM

## 2015-06-16 DIAGNOSIS — E038 Other specified hypothyroidism: Secondary | ICD-10-CM

## 2015-06-16 LAB — CBC WITH DIFFERENTIAL/PLATELET
BASOS ABS: 0 10*3/uL (ref 0.0–0.1)
BASOS PCT: 0 % (ref 0–1)
Eosinophils Absolute: 0 10*3/uL (ref 0.0–0.7)
Eosinophils Relative: 0 % (ref 0–5)
HCT: 31.1 % — ABNORMAL LOW (ref 39.0–52.0)
Hemoglobin: 10.6 g/dL — ABNORMAL LOW (ref 13.0–17.0)
Lymphocytes Relative: 3 % — ABNORMAL LOW (ref 12–46)
Lymphs Abs: 0.6 10*3/uL — ABNORMAL LOW (ref 0.7–4.0)
MCH: 29.8 pg (ref 26.0–34.0)
MCHC: 34.1 g/dL (ref 30.0–36.0)
MCV: 87.4 fL (ref 78.0–100.0)
MONO ABS: 0.6 10*3/uL (ref 0.1–1.0)
MONOS PCT: 3 % (ref 3–12)
Neutro Abs: 18.3 10*3/uL — ABNORMAL HIGH (ref 1.7–7.7)
Neutrophils Relative %: 94 % — ABNORMAL HIGH (ref 43–77)
PLATELETS: 521 10*3/uL — AB (ref 150–400)
RBC: 3.56 MIL/uL — AB (ref 4.22–5.81)
RDW: 14.1 % (ref 11.5–15.5)
WBC: 19.5 10*3/uL — AB (ref 4.0–10.5)

## 2015-06-16 LAB — COMPREHENSIVE METABOLIC PANEL
ALT: 23 U/L (ref 17–63)
ANION GAP: 10 (ref 5–15)
AST: 19 U/L (ref 15–41)
Albumin: 1.9 g/dL — ABNORMAL LOW (ref 3.5–5.0)
Alkaline Phosphatase: 143 U/L — ABNORMAL HIGH (ref 38–126)
BUN: 9 mg/dL (ref 6–20)
CO2: 26 mmol/L (ref 22–32)
CREATININE: 0.93 mg/dL (ref 0.61–1.24)
Calcium: 8.4 mg/dL — ABNORMAL LOW (ref 8.9–10.3)
Chloride: 87 mmol/L — ABNORMAL LOW (ref 101–111)
Glucose, Bld: 244 mg/dL — ABNORMAL HIGH (ref 65–99)
POTASSIUM: 5.4 mmol/L — AB (ref 3.5–5.1)
Sodium: 123 mmol/L — ABNORMAL LOW (ref 135–145)
Total Bilirubin: 0.7 mg/dL (ref 0.3–1.2)
Total Protein: 6 g/dL — ABNORMAL LOW (ref 6.5–8.1)

## 2015-06-16 LAB — URINALYSIS, ROUTINE W REFLEX MICROSCOPIC
BILIRUBIN URINE: NEGATIVE
Glucose, UA: 100 mg/dL — AB
HGB URINE DIPSTICK: NEGATIVE
Ketones, ur: 40 mg/dL — AB
LEUKOCYTES UA: NEGATIVE
NITRITE: NEGATIVE
Protein, ur: NEGATIVE mg/dL
Specific Gravity, Urine: 1.008 (ref 1.005–1.030)
Urobilinogen, UA: 0.2 mg/dL (ref 0.0–1.0)
pH: 5 (ref 5.0–8.0)

## 2015-06-16 LAB — GLUCOSE, CAPILLARY
GLUCOSE-CAPILLARY: 208 mg/dL — AB (ref 65–99)
GLUCOSE-CAPILLARY: 401 mg/dL — AB (ref 65–99)
GLUCOSE-CAPILLARY: 309 mg/dL — AB (ref 65–99)
GLUCOSE-CAPILLARY: 186 mg/dL — AB (ref 65–99)
Glucose-Capillary: 131 mg/dL — ABNORMAL HIGH (ref 65–99)
Glucose-Capillary: 253 mg/dL — ABNORMAL HIGH (ref 65–99)

## 2015-06-16 LAB — SODIUM, URINE, RANDOM: SODIUM UR: 47 mmol/L

## 2015-06-16 LAB — OSMOLALITY, URINE: OSMOLALITY UR: 266 mosm/kg — AB (ref 390–1090)

## 2015-06-16 LAB — CREATININE, URINE, RANDOM: CREATININE, URINE: 31.22 mg/dL

## 2015-06-16 LAB — HEMOGLOBIN A1C
Hgb A1c MFr Bld: 9.4 % — ABNORMAL HIGH (ref 4.8–5.6)
Mean Plasma Glucose: 223 mg/dL

## 2015-06-16 LAB — OSMOLALITY: Osmolality: 273 mOsm/kg — ABNORMAL LOW (ref 275–300)

## 2015-06-16 LAB — URIC ACID: Uric Acid, Serum: 2.1 mg/dL — ABNORMAL LOW (ref 4.4–7.6)

## 2015-06-16 MED ORDER — INSULIN ASPART 100 UNIT/ML ~~LOC~~ SOLN
4.0000 [IU] | Freq: Three times a day (TID) | SUBCUTANEOUS | Status: DC
  Administered 2015-06-16: 4 [IU] via SUBCUTANEOUS

## 2015-06-16 MED ORDER — SODIUM POLYSTYRENE SULFONATE 15 GM/60ML PO SUSP
15.0000 g | Freq: Once | ORAL | Status: AC
  Administered 2015-06-16: 15 g via ORAL
  Filled 2015-06-16: qty 60

## 2015-06-16 MED ORDER — SODIUM CHLORIDE 0.9 % IV SOLN
INTRAVENOUS | Status: DC
  Administered 2015-06-16 (×2): via INTRAVENOUS

## 2015-06-16 MED ORDER — INSULIN ASPART 100 UNIT/ML ~~LOC~~ SOLN
25.0000 [IU] | Freq: Once | SUBCUTANEOUS | Status: DC
  Administered 2015-06-16: 25 [IU] via SUBCUTANEOUS

## 2015-06-16 MED ORDER — INSULIN GLARGINE 100 UNIT/ML ~~LOC~~ SOLN
55.0000 [IU] | Freq: Every day | SUBCUTANEOUS | Status: DC
Start: 2015-06-17 — End: 2015-06-17
  Filled 2015-06-16: qty 0.55

## 2015-06-16 MED ORDER — INSULIN ASPART 100 UNIT/ML ~~LOC~~ SOLN
15.0000 [IU] | Freq: Once | SUBCUTANEOUS | Status: AC
  Administered 2015-06-16: 15 [IU] via SUBCUTANEOUS

## 2015-06-16 MED ORDER — INSULIN GLARGINE 100 UNIT/ML ~~LOC~~ SOLN
15.0000 [IU] | Freq: Once | SUBCUTANEOUS | Status: AC
  Administered 2015-06-16: 15 [IU] via SUBCUTANEOUS
  Filled 2015-06-16: qty 0.15

## 2015-06-16 NOTE — Progress Notes (Signed)
     Subjective:  Patient reports pain as moderate.  Overall doing reasonably well.  Objective:   VITALS:   Filed Vitals:   06/15/15 2340 06/16/15 0104 06/16/15 0215 06/16/15 0325  BP: 128/60 124/72 125/70 129/69  Pulse: 89 99 98 102  Temp: 98.1 F (36.7 C) 98 F (36.7 C) 98.2 F (36.8 C) 98.7 F (37.1 C)  TempSrc:  Oral Oral Oral  Resp: Height:      Weight:      SpO2: 94% 94% 97% 98%    Neurologically intact Dorsiflexion/Plantar flexion intact Incision: dressing C/D/I Drains intact.  Lab Results  Component Value Date   WBC 19.5* 06/16/2015   HGB 10.6* 06/16/2015   HCT 31.1* 06/16/2015   MCV 87.4 06/16/2015   PLT 521* 06/16/2015   BMET    Component Value Date/Time   NA 123* 06/16/2015 0539   K 5.4* 06/16/2015 0539   CL 87* 06/16/2015 0539   CO2 26 06/16/2015 0539   GLUCOSE 244* 06/16/2015 0539   BUN 9 06/16/2015 0539   CREATININE 0.93 06/16/2015 0539   CALCIUM 8.4* 06/16/2015 0539   GFRNONAA >60 06/16/2015 0539   GFRAA >60 06/16/2015 0539     Assessment/Plan: 1 Day Post-Op   Principal Problem:   Infected prosthetic knee joint, right unicompartmental knee  Active Problems:   Spinal stenosis, lumbar   Hypothyroidism   Uncontrolled type 2 diabetes mellitus   HTN (hypertension)   BPH (benign prostatic hyperplasia)   History of back surgery   Over the okay to be weightbearing as tolerated, can get out of bed with PT, will likely need a knee immobilizer. He will need a PICC line, infectious disease consultation, IV antibiotics for at least 6 weeks. Hopefully we can save his prosthesis.  Plan to remove his drains tomorrow.   , P 06/16/2015, 8:43 AM   Teryl Lucy, MD Cell [phone]

## 2015-06-16 NOTE — Progress Notes (Signed)
CRITICAL VALUE ALERT  Critical value received:  Osmolarity = 273  Date of notification:  06/16/2015  Time of notification:  14:05  Critical value read back:Yes.    Nurse who received alert:  Lurena Nida  MD notified (1st page):  Dr. Thedore Mins  Time of first page:  14:06  MD notified (2nd page):  Time of second page:  Responding MD:  Dr. Thedore Mins  Time MD responded:  14:15

## 2015-06-16 NOTE — Clinical Social Work Placement (Signed)
   CLINICAL SOCIAL WORK PLACEMENT  NOTE  Date:  06/16/2015  Patient Details  Name: David Mathews MRN: [id number] Date of Birth: 08-29-1941  Clinical Social Work is seeking post-discharge placement for this patient at the Skilled  Nursing Facility level of care (*CSW will initial, date and re-position this form in  chart as items are completed):  Yes   Patient/family provided with [place] Clinical Social Work Department's list of facilities offering this level of care within the geographic area requested by the patient (or if unable, by the patient's family).  Yes   Patient/family informed of their freedom to choose among providers that offer the needed level of care, that participate in Medicare, Medicaid or managed care program needed by the patient, have an available bed and are willing to accept the patient.  Yes   Patient/family informed of 's ownership interest in [facility] and [facility], as well as of the fact that they are under no obligation to receive care at these facilities.  PASRR submitted to EDS on 06/16/15     PASRR number received on 06/16/15     Existing PASRR number confirmed on       FL2 transmitted to all facilities in geographic area requested by pt/family on 06/16/15     FL2 transmitted to all facilities within larger geographic area on       Patient informed that his/her managed care company has contracts with or will negotiate with certain facilities, including the following:            Patient/family informed of bed offers received.  Patient chooses bed at       Physician recommends and patient chooses bed at      Patient to be transferred to   on  .  Patient to be transferred to facility by       Patient family notified on   of transfer.  Name of family member notified:        PHYSICIAN Please prepare priority discharge summary, including medications, Please sign FL2, Please prepare prescriptions     Additional  Comment:    Sharol Harness, LCSWA [phone]

## 2015-06-16 NOTE — Progress Notes (Signed)
Patient Demographics:    David Mathews, is a 74 y.o. male, DOB - [birth year], RUE:[id number]  Admit date - 06/14/2015   Admitting Physician Bobette Mo, MD  Outpatient Primary MD for the patient is [name]  LOS - 2   Chief Complaint  Patient presents with  . DVT    The patient said Dr. Theophilus Kinds sent him here for a possible DVT.  He does have leg swelling and redness.  He denies any injury to the right leg other than "straining" to sit on the toilet.          Subjective:    David Mathews today has, No headache, No chest pain, No abdominal pain - No Nausea, No new weakness tingling or numbness, No Cough - SOB.  Has pain in the right knee.   Assessment  & Plan :       1. Right knee swelling. 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 Status post incision and drainage by Dr. Dion Saucier on 06/15/2015, wound cultures growing gram-positive cocci in clusters, continue vancomycin stop Zosyn. He likely seeded from his L-spine postsurgical wound infection. L-spine postsurgical wound was drained in the OR on 06/15/2015 by Dr. Gerlene Fee.  Discussed his case with both orthopedics and neurosurgery. Stable from both standpoint and can be discharged once ID has seen the patient. We'll request ID to evaluate. We'll check blood cultures as it appears his right knee has been seeded from his L-spine wound and he could have been transiently bacteremic. Although afebrile throughout his stay. We'll request ID to evaluate as well, check TTE.   2. BPH. On alpha-blocker continue.    3. Dyslipidemia. Continue home dose statin.    4. Essential hypertension - blood pressure soft hold ACE and monitor.   5. Hypothyroidism. On Synthroid continued at home dose.    6. Hyponatremia. Pending serum osmolality,  urine osmolality low, urine sodium borderline, more consistent clinically with dehydration, hydrated with normal saline and monitor.   7. Hyperkalemia. Low-dose Kayexalate, hold ACE, removed potassium from IV fluids. Repeat BMP in the morning.    8. DM type II. On Lantus and sliding scale continue to monitor  Lab Results  Component Value Date   HGBA1C 9.4* 06/15/2015    CBG (last 3)   Recent Labs  06/15/15 2242 06/15/15 2342 06/16/15 0805  GLUCAP 131* 180* 208*       Code Status : Full  Family Communication  : None present  Disposition Plan  : To be decided  Consults  :  Orthopedics  Procedures  :   Right knee incision and drainage by Dr. Dion Saucier 06/15/2015  L-spine superficial drainage by Dr. Gerlene Fee 06/15/2015  DVT Prophylaxis  :  Lovenox    Lab Results  Component Value Date   PLT 521* 06/16/2015    Inpatient Medications  Scheduled Meds: . alfuzosin  10 mg Oral Q breakfast  . aspirin EC  81 mg Oral Daily  . diclofenac  75 mg Oral BID WC  . docusate sodium  100 mg Oral BID  . enoxaparin (LOVENOX) injection  30 mg Subcutaneous Q12H  . finasteride  5 mg Oral Daily  . HYDROmorphone      . insulin aspart  0-15 Units Subcutaneous TID WC  .  insulin aspart  0-5 Units Subcutaneous QHS  . insulin glargine  40 Units Subcutaneous Daily  . levothyroxine  88 mcg Oral QAC breakfast  . lisinopril  20 mg Oral Daily  . piperacillin-tazobactam (ZOSYN)  IV  3.375 g Intravenous 3 times per day  . pregabalin  150 mg Oral BID  . senna  1 tablet Oral BID  . simvastatin  20 mg Oral Daily  . vancomycin  1,250 mg Intravenous Q12H   Continuous Infusions: . sodium chloride 75 mL/hr at 06/16/15 0724   PRN Meds:.acetaminophen **OR** acetaminophen, alum & mag hydroxide-simeth, bisacodyl, diphenhydrAMINE, HYDROmorphone (DILAUDID) injection, menthol-cetylpyridinium **OR** phenol, methocarbamol **OR** methocarbamol (ROBAXIN)  IV, metoCLOPramide **OR** metoCLOPramide (REGLAN)  injection, ondansetron **OR** ondansetron (ZOFRAN) IV, oxyCODONE, polyethylene glycol, promethazine, traMADol  Antibiotics  :     Anti-infectives    Start     Dose/Rate Route Frequency Ordered Stop   06/15/15 2149  50,000 units bacitracin in 0.9% normal saline 250 mL irrigation  Status:  Discontinued       As needed 06/15/15 2150 06/15/15 2229   06/15/15 1000  vancomycin (VANCOCIN) 1,250 mg in sodium chloride 0.9 % 250 mL IVPB     1,250 mg 166.7 mL/hr over 90 Minutes Intravenous Every 12 hours 06/15/15 0038     06/15/15 0115  piperacillin-tazobactam (ZOSYN) IVPB 3.375 g     3.375 g 12.5 mL/hr over 240 Minutes Intravenous 3 times per day 06/15/15 0032     06/15/15 0100  vancomycin (VANCOCIN) 1,500 mg in sodium chloride 0.9 % 500 mL IVPB     1,500 mg 250 mL/hr over 120 Minutes Intravenous  Once 06/15/15 0038 06/15/15 0358   06/15/15 0032  vancomycin (VANCOCIN) IVPB 1000 mg/200 mL premix  Status:  Discontinued     1,000 mg 200 mL/hr over 60 Minutes Intravenous Every 12 hours 06/15/15 0032 06/15/15 0036   06/14/15 2300  clindamycin (CLEOCIN) IVPB 600 mg     600 mg 100 mL/hr over 30 Minutes Intravenous  Once 06/14/15 2246 06/14/15 2337        Objective:   Filed Vitals:   06/16/15 0104 06/16/15 0215 06/16/15 0325 06/16/15 1030  BP: 124/72 125/70 129/69 97/50  Pulse: 99 98 102 109  Temp: 98 F (36.7 C) 98.2 F (36.8 C) 98.7 F (37.1 C) 98.3 F (36.8 C)  TempSrc: Oral Oral Oral Oral  Resp: 18 18 18 20   Height:      Weight:      SpO2: 94% 97% 98% 96%    Wt Readings from Last 3 Encounters:  06/15/15 88.5 kg (195 lb 1.7 oz)  05/20/15 89.359 kg (197 lb)  05/17/15 89.359 kg (197 lb)     Intake/Output Summary (Last 24 hours) at 06/16/15 1047 Last data filed at 06/16/15 1034  Gross per 24 hour  Intake   3270 ml  Output   3090 ml  Net    180 ml     Physical Exam  Awake Alert, Oriented X 3, No new F.N deficits, Normal affect [place].AT,PERRAL Supple Neck,No JVD, No cervical  lymphadenopathy appriciated.  Symmetrical Chest wall movement, Good air movement bilaterally, CTAB RRR,No Gallops,Rubs or new Murmurs, No Parasternal Heave +ve B.Sounds, Abd Soft, No tenderness, No organomegaly appriciated, No rebound - guarding or rigidity. No Cyanosis, Clubbing or edema, No new Rash or bruise Right knee has postop drain & bandage, minimal surrounding warmth and redness, has a drain in his L-spine as well    Data Review:   Micro Results  Recent Results (from the past 240 hour(s))  Body fluid culture     Status: None (Preliminary result)   Collection Time: 06/15/15 10:34 AM  Result Value Ref Range Status   Specimen Description FLUID KNEE RIGHT SYNOVIAL  Final   Special Requests Normal  Final   Gram Stain   Final    ABUNDANT WBC PRESENT, PREDOMINANTLY PMN FEW GRAM POSITIVE COCCI IN CLUSTERS GRAM STAIN REVIEWED-AGREE WITH RESULT M VESTAL    Culture PENDING  Incomplete   Report Status PENDING  Incomplete  Surgical pcr screen     Status: Abnormal   Collection Time: 06/15/15  1:50 PM  Result Value Ref Range Status   MRSA, PCR NEGATIVE NEGATIVE Final   Staphylococcus aureus POSITIVE (A) NEGATIVE Final    Comment:        The Xpert SA Assay (FDA approved for NASAL specimens in patients over 41 years of age), is one component of a comprehensive surveillance program.  Test performance has been validated by [facility] for patients greater than or equal to 16 year old. It is not intended to diagnose infection nor to guide or monitor treatment.   Body fluid culture     Status: None (Preliminary result)   Collection Time: 06/15/15  8:13 PM  Result Value Ref Range Status   Specimen Description FLUID SYNOVIAL RIGHT KNEE  Final   Special Requests NONE  Final   Gram Stain   Final    ABUNDANT WBC PRESENT,BOTH PMN AND MONONUCLEAR RARE GRAM POSITIVE COCCI IN CLUSTERS RESULT CALLED TO, READ BACK BY AND VERIFIED WITH: A JETER,RN 06/16/15 AT 0208 RHOLMES CONFIRMED BY R  GREEN    Culture PENDING  Incomplete   Report Status PENDING  Incomplete  Tissue culture     Status: None (Preliminary result)   Collection Time: 06/15/15  8:13 PM  Result Value Ref Range Status   Specimen Description TISSUE  Final   Special Requests RIGHT KNEE SYNOVIUM PT ON ZOSYN,VANCOMYCIN  Final   Gram Stain   Final    RARE WBC PRESENT, PREDOMINANTLY MONONUCLEAR NO ORGANISMS SEEN Performed at [facility]    Culture PENDING  Incomplete   Report Status PENDING  Incomplete  Tissue culture     Status: None (Preliminary result)   Collection Time: 06/15/15  8:39 PM  Result Value Ref Range Status   Specimen Description TISSUE  Final   Special Requests RIGHT KNEE PT ON ZOSYN,VANCOMYCIN  Final   Gram Stain   Final    RARE WBC PRESENT,BOTH PMN AND MONONUCLEAR NO ORGANISMS SEEN Performed at [facility]    Culture PENDING  Incomplete   Report Status PENDING  Incomplete  Wound culture     Status: None (Preliminary result)   Collection Time: 06/15/15  9:51 PM  Result Value Ref Range Status   Specimen Description WOUND RIGHT KNEE  Final   Special Requests ANTIBIOTIC ZOSYN AND VANCOMYCIN  Final   Gram Stain   Final    MODERATE WBC PRESENT,BOTH PMN AND MONONUCLEAR NO SQUAMOUS EPITHELIAL CELLS SEEN MODERATE GRAM POSITIVE COCCI IN PAIRS IN CLUSTERS Performed at [facility]    Culture   Final    NO GROWTH 1 DAY Performed at [facility]    Report Status PENDING  Incomplete    Radiology Reports Dg Lumbar Spine 2-3 Views  05/20/2015   CLINICAL DATA:  L2-L3 PLIF, extension of instrumentation  EXAM: DG C-ARM 61-120 MIN; LUMBAR SPINE - 2-3 VIEW  COMPARISON:  Two intraoperative C-arm fluoroscopic images were obtained.  FLUOROSCOPY TIME:  0 minutes 26 seconds  Images obtained: 2  FINDINGS: Pairs of pedicle screws are identified at adjacent levels of lumbar spine.  Bones demineralized.  Disc prostheses present.  Indwelling pedicle screws and bars from prior  lower lumbar fusion are also identified.  Based on identification of a rib on the lateral view, these pedicle screws are likely at L2 and L3 though this is not definitive due to lack of landmarks and lack of visualization of the entire lumbar spine.  IMPRESSION: Prior lower lumbar fusion.  Single level lumbar fusion question L2-L3 as above.   Electronically Signed   By: Ulyses Southward M.D.   On: 05/20/2015 16:03   Dg Knee 2 Views Right  06/14/2015   CLINICAL DATA:  Right knee pain and swelling. Elevated white blood cell count. History of right knee surgery 2011, 5 years prior.  EXAM: RIGHT KNEE - 1-2 VIEW  COMPARISON:  None.  FINDINGS: Post unilateral medial compartment hemiarthroplasty. There is less than 2 mm lucency adjacent to the central aspect of the tibial component. No acute fracture or dislocation. No erosion or periosteal reaction. Subchondral irregularity involving the patellofemoral articulation consistent with osteoarthritis.  Large heterogeneous joint effusion containing questionable foci of air. Small lucent foci posterior to the distal femoral metaphysis may reflect air within posterior joint fluid. Additional lucent foci projecting in the posterior proximal lower leg. These foci in may reflect soft tissue air versus less likely confluent edema. Diffuse soft tissue edema.  IMPRESSION: Large joint effusion with questionable foci of intra-articular air, raising concern for septic joint. Probable foci of soft tissue air posterior to the distal femur as well as proximal lower leg. This may reflect air within joint effusion and elongated or ruptured Baker cyst versus less likely foci of more confluent soft tissue edema. No osseous destructive change.  These results were called by telephone at the time of interpretation on 06/14/2015 at 11:50 pm to Dr. Tery Sanfilippo , who verbally acknowledged these results.   Electronically Signed   By: Rubye Oaks M.D.   On: 06/14/2015 23:52   Dg Hand 2 View  Left  06/14/2015   CLINICAL DATA:  Left ring finger swelling.  EXAM: LEFT HAND - 2 VIEW  COMPARISON:  None.  FINDINGS: Lateral view limited due to osseous overlap. Soft tissue edema about the ring finger. No evidence of fracture, dislocation, or bony destructive change. Multifocal osteoarthritis throughout the digits.  IMPRESSION: Soft tissue edema about the ring finger. No evidence of associated osseous abnormality allowing for limitations.   Electronically Signed   By: Rubye Oaks M.D.   On: 06/14/2015 23:53   Dg Knee Right Port  06/16/2015   CLINICAL DATA:  Postop right knee.  EXAM: PORTABLE RIGHT KNEE - 1-2 VIEW  COMPARISON:  Radiographs yesterday.  FINDINGS: Medial compartment hemi arthroplasty in expected alignment. Surgical drain is in place. Recent postsurgical change includes joint effusion with air, decreased size of fusion from prior exam. Soft tissue air again seen posteriorly and in the proximal lower leg.  IMPRESSION: Decreased joint effusion with postsurgical drains in place. Medial compartment hemiarthroplasty in expected alignment.   Electronically Signed   By: Rubye Oaks M.D.   On: 06/16/2015 02:33   Dg C-arm 1-60 Min  05/20/2015   CLINICAL DATA:  L2-L3 PLIF, extension of instrumentation  EXAM: DG C-ARM 61-120 MIN; LUMBAR SPINE - 2-3 VIEW  COMPARISON:  Two intraoperative C-arm fluoroscopic images  were obtained.  FLUOROSCOPY TIME:  0 minutes 26 seconds  Images obtained: 2  FINDINGS: Pairs of pedicle screws are identified at adjacent levels of lumbar spine.  Bones demineralized.  Disc prostheses present.  Indwelling pedicle screws and bars from prior lower lumbar fusion are also identified.  Based on identification of a rib on the lateral view, these pedicle screws are likely at L2 and L3 though this is not definitive due to lack of landmarks and lack of visualization of the entire lumbar spine.  IMPRESSION: Prior lower lumbar fusion.  Single level lumbar fusion question L2-L3 as  above.   Electronically Signed   By: Ulyses Southward M.D.   On: 05/20/2015 16:03     CBC  Recent Labs Lab 06/14/15 2152 06/15/15 0504 06/16/15 0539  WBC 23.5* 19.0* 19.5*  HGB 11.0* 10.2* 10.6*  HCT 31.6* 29.3* 31.1*  PLT 459* 423* 521*  MCV 86.6 86.7 87.4  MCH 30.1 30.2 29.8  MCHC 34.8 34.8 34.1  RDW 13.7 13.9 14.1  LYMPHSABS 1.0 1.4 0.6*  MONOABS 1.2* 1.0 0.6  EOSABS 0.1 0.1 0.0  BASOSABS 0.0 0.0 0.0    Chemistries   Recent Labs Lab 06/14/15 2152 06/15/15 0504 06/16/15 0539  NA 121* 125* 123*  K 4.9 4.7 5.4*  CL 85* 87* 87*  CO2 25 29 26   GLUCOSE 472* 314* 244*  BUN 9 11 9   CREATININE 1.02 0.98 0.93  CALCIUM 8.7* 8.6* 8.4*  AST  --  13* 19  ALT  --  23 23  ALKPHOS  --  151* 143*  BILITOT  --  0.7 0.7   ------------------------------------------------------------------------------------------------------------------ estimated creatinine clearance is 77.8 mL/min (by C-G formula based on Cr of 0.93). ------------------------------------------------------------------------------------------------------------------  Recent Labs  06/15/15 1140  HGBA1C 9.4*   ------------------------------------------------------------------------------------------------------------------ No results for input(s): CHOL, HDL, LDLCALC, TRIG, CHOLHDL, LDLDIRECT in the last 72 hours. ------------------------------------------------------------------------------------------------------------------ No results for input(s): TSH, T4TOTAL, T3FREE, THYROIDAB in the last 72 hours.  Invalid input(s): FREET3 ------------------------------------------------------------------------------------------------------------------ No results for input(s): VITAMINB12, FOLATE, FERRITIN, TIBC, IRON, RETICCTPCT in the last 72 hours.  Coagulation profile No results for input(s): INR, PROTIME in the last 168 hours.  No results for input(s): DDIMER in the last 72 hours.  Cardiac Enzymes No results for  input(s): CKMB, TROPONINI, MYOGLOBIN in the last 168 hours.  Invalid input(s): CK ------------------------------------------------------------------------------------------------------------------ Invalid input(s): POCBNP   Time Spent in minutes   35   , K M.D on 06/16/2015 at 10:47 AM  Between 7am to 7pm - Pager - [phone]  After 7pm go to [email or web address] - password [facility]  [facility] -  Office  [phone]

## 2015-06-16 NOTE — Progress Notes (Signed)
Rehab Admissions Coordinator Note:  Patient was screened by Trish Mage for appropriateness for an Inpatient Acute Rehab Consult.  Noted PT recommending CIR and OT recommending SNF.  Noted patient prefers SNF in [place] or [place] area.   At this time, we are recommending Skilled Nursing Facility.  Lelon Frohlich M 06/16/2015, 2:10 PM  I can be reached at [phone].

## 2015-06-16 NOTE — Progress Notes (Signed)
Patient ID: David Mathews, male   DOB: 1941/04/20, 74 y.o.   MRN: [id number] Afeb, vss Feels much better than pre op.  Cultures pending. Drain working well. Will increase activity  based on his knee issues. Will get 6 to 8 weeks of IV antibiotics for his knee which is more than enough for his back.

## 2015-06-16 NOTE — Consult Note (Signed)
[facility]    Date of Admission:  06/14/2015   Day 3 vancomycin        Day 3 piperacillin/tazobactam       Reason for Consult: Septic prosthetic knee joint   Referring Physician: Dr. Thedore Mins  Principal Problem:   Infected prosthetic knee joint, right unicompartmental knee  Active Problems:   Spinal stenosis, lumbar   Hypothyroidism   Uncontrolled type 2 diabetes mellitus   HTN (hypertension)   BPH (benign prostatic hyperplasia)   History of back surgery   . alfuzosin  10 mg Oral Q breakfast  . aspirin EC  81 mg Oral Daily  . docusate sodium  100 mg Oral BID  . enoxaparin (LOVENOX) injection  30 mg Subcutaneous Q12H  . finasteride  5 mg Oral Daily  . insulin aspart  0-15 Units Subcutaneous TID WC  . insulin aspart  0-5 Units Subcutaneous QHS  . insulin glargine  40 Units Subcutaneous Daily  . levothyroxine  88 mcg Oral QAC breakfast  . pregabalin  150 mg Oral BID  . senna  1 tablet Oral BID  . simvastatin  20 mg Oral Daily  . sodium polystyrene  15 g Oral Once  . vancomycin  1,250 mg Intravenous Q12H    Recommendations: 1. Discontinue piperacillin/tazobactam 2. Continue vancomycin 3. He will need prolonged course of IV antibiotics (4-6 weeks) followed by several months of oral antibiotics following polyexchange, will monitor cultures  ADDENDUM: agree with Dr. Earnest Conroy.  Prosthetic joint infection s/p polyexchange.  Fluid also noted in back, superficial, though no cultures or gram stain noted.    Assessment: David Mathews has a clinically septic prosthetic right knee joint which began 2 weeks after spinal fusion surgery, concerning for bacteremic seeding. He underwent irrigation and debridement and poly-exchange of his right prosthetic knee yesterday with Orthopedics and Neurosurgery irrigated his spinal fusion. Gram stain of the knee was positive for GPCs in clusters.    HPI: David Mathews is a 74 y.o. male admitted 7/26 for  right knee pain. He had bilateral knee replacements in 2010, without complications. On June 30, he underwent spinal fusion procedure. Two weeks later, he began feeling pain in his right knee which became progressively swollen. He denied fever during that time. He also says he hasn't been able to close his right hand around a golf club for the last month; he was able to do this beforehand. He says he was on plaquenil for RA in the past but I don't see this on his drug list. His left ring finger has become progressively red during this time.  He underwent irrigation and debridement in the OR for both his right knee and spine on 7/26. Intra-op gram stain of the knee revealed GPCs in clusters. He was noted to be MRSA nare positive years ago. He has been on vancomycin and piperacillin/tazobactam for 3 days now, remains afebrile. Blood cultures were drawn today.  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 He is planning a trip to [place] in mid-September and is hoping to be able to go.  Review of Systems  Constitutional: Negative for fever and chills.  Respiratory: Negative for cough and hemoptysis.   Cardiovascular: Positive for leg swelling. Negative for chest pain, palpitations and orthopnea.  Gastrointestinal: Negative for nausea, vomiting, abdominal pain, diarrhea and constipation.  Genitourinary: Negative for dysuria, urgency and frequency.  Musculoskeletal: Positive for joint pain.  Skin: Negative  for rash.  Neurological: Positive for weakness. Negative for headaches.    Past Medical History  Diagnosis Date  . Hypothyroidism   . Diabetes mellitus without complication   . Arthritis   . Infected prosthetic knee joint, right unicompartmental knee  06/14/2015    History  Substance Use Topics  . Smoking status: Former Smoker -- 1.00 packs/day for 10 years    Types: Cigarettes    Quit date: 05/17/1999  . Smokeless tobacco: Not on file  . Alcohol Use: No    History reviewed.  No pertinent family history. No Known Allergies  OBJECTIVE: Blood pressure 97/50, pulse 109, temperature 98.3 F (36.8 C), temperature source Oral, resp. rate 20, height 5\' 9"  (1.753 m), weight 88.5 kg (195 lb 1.7 oz), SpO2 96 %. General: Sitting in recliner comfortably. Skin: Dry, intact. Lungs: Clear anteriorly. Cor: Regular rate, 2/6 systolic murmur loudest at RUSB, normal S1/S2. No peripheral edema. No Janeway lesions. Abdomen: Soft, non-tender to palpation. MSK: Right knee bandaged with distal swelling in foot. Left proximal third digit erythematous and slightly tender to palpation. He cannot make a fist.  Lab Results Lab Results  Component Value Date   WBC 19.5* 06/16/2015   HGB 10.6* 06/16/2015   HCT 31.1* 06/16/2015   MCV 87.4 06/16/2015   PLT 521* 06/16/2015    Lab Results  Component Value Date   CREATININE 0.93 06/16/2015   BUN 9 06/16/2015   NA 123* 06/16/2015   K 5.4* 06/16/2015   CL 87* 06/16/2015   CO2 26 06/16/2015    Lab Results  Component Value Date   ALT 23 06/16/2015   AST 19 06/16/2015   ALKPHOS 143* 06/16/2015   BILITOT 0.7 06/16/2015     Microbiology: Recent Results (from the past 240 hour(s))  Body fluid culture     Status: None (Preliminary result)   Collection Time: 06/15/15 10:34 AM  Result Value Ref Range Status   Specimen Description FLUID KNEE RIGHT SYNOVIAL  Final   Special Requests Normal  Final   Gram Stain   Final    ABUNDANT WBC PRESENT, PREDOMINANTLY PMN FEW GRAM POSITIVE COCCI IN CLUSTERS GRAM STAIN REVIEWED-AGREE WITH RESULT M VESTAL    Culture CULTURE REINCUBATED FOR BETTER GROWTH  Final   Report Status PENDING  Incomplete  Surgical pcr screen     Status: Abnormal   Collection Time: 06/15/15  1:50 PM  Result Value Ref Range Status   MRSA, PCR NEGATIVE NEGATIVE Final   Staphylococcus aureus POSITIVE (A) NEGATIVE Final    Comment:        The Xpert SA Assay (FDA approved for NASAL specimens in patients over 21 years of  age), is one component of a comprehensive surveillance program.  Test performance has been validated by [facility] for patients greater than or equal to 2 year old. It is not intended to diagnose infection nor to guide or monitor treatment.   Body fluid culture     Status: None (Preliminary result)   Collection Time: 06/15/15  8:13 PM  Result Value Ref Range Status   Specimen Description FLUID SYNOVIAL RIGHT KNEE  Final   Special Requests NONE  Final   Gram Stain   Final    ABUNDANT WBC PRESENT,BOTH PMN AND MONONUCLEAR RARE GRAM POSITIVE COCCI IN CLUSTERS RESULT CALLED TO, READ BACK BY AND VERIFIED WITH: A JETER,RN 06/16/15 AT 0208 RHOLMES CONFIRMED BY R GREEN    Culture TOO YOUNG TO READ  Final   Report Status PENDING  Incomplete  Tissue culture     Status: None (Preliminary result)   Collection Time: 06/15/15  8:13 PM  Result Value Ref Range Status   Specimen Description TISSUE  Final   Special Requests RIGHT KNEE SYNOVIUM PT ON ZOSYN,VANCOMYCIN  Final   Gram Stain   Final    RARE WBC PRESENT, PREDOMINANTLY MONONUCLEAR NO ORGANISMS SEEN Performed at [facility]    Culture PENDING  Incomplete   Report Status PENDING  Incomplete  Tissue culture     Status: None (Preliminary result)   Collection Time: 06/15/15  8:21 PM  Result Value Ref Range Status   Specimen Description TISSUE  Final   Special Requests   Final    RIGHT SYNOVIAL POSTERIOR KNEE PT ON ZOSYN,VANCOMYCIN   Gram Stain   Final    NO WBC SEEN NO ORGANISMS SEEN Performed at [facility]    Culture PENDING  Incomplete   Report Status PENDING  Incomplete  Tissue culture     Status: None (Preliminary result)   Collection Time: 06/15/15  8:39 PM  Result Value Ref Range Status   Specimen Description TISSUE  Final   Special Requests RIGHT KNEE PT ON ZOSYN,VANCOMYCIN  Final   Gram Stain   Final    RARE WBC PRESENT,BOTH PMN AND MONONUCLEAR NO ORGANISMS SEEN Performed at [facility]      Culture PENDING  Incomplete   Report Status PENDING  Incomplete  Wound culture     Status: None (Preliminary result)   Collection Time: 06/15/15  9:51 PM  Result Value Ref Range Status   Specimen Description WOUND RIGHT KNEE  Final   Special Requests ANTIBIOTIC ZOSYN AND VANCOMYCIN  Final   Gram Stain   Final    MODERATE WBC PRESENT,BOTH PMN AND MONONUCLEAR NO SQUAMOUS EPITHELIAL CELLS SEEN MODERATE GRAM POSITIVE COCCI IN PAIRS IN CLUSTERS Performed at [facility]    Culture   Final    NO GROWTH 1 DAY Performed at [facility]    Report Status PENDING  Incomplete    Selina Cooley, MD [facility] 06/16/2015, 11:43 AM

## 2015-06-16 NOTE — Progress Notes (Signed)
Orthopedic Tech Progress Note Patient Details:  David Mathews [birth year] [id number]  Ortho Devices Type of Ortho Device: Knee Immobilizer Ortho Device/Splint Location: RLE Ortho Device/Splint Interventions: Application   David Mathews 06/16/2015, 12:28 AM

## 2015-06-16 NOTE — Progress Notes (Signed)
Occupational Therapy Evaluation Patient Details Name: David Mathews MRN: [id number] DOB: [birth year] Today's Date: 06/16/2015    History of Present Illness Pt is a 74 y/o male with a PMH significant for B partial TKA in 2010, back surgery 05/20/15. Pt presents with R knee infection and underwent I&D (WBAT). Pt also noted to have a non-healed lumbar wound from previous surgery and pt was taken for I&D of this area as well.    Clinical Impression   PTA, pt independent with ADL and mobility. Pt with significant functional decline and will not be able to DC home safely alone. Pt will benefit from rehab at SNF to return to PLOF. Pt in agreement with SNF and prefers a facility in [place] or [place]. Will follow acutely to facilitate D/C to next venue of care.     Follow Up Recommendations  SNF;Supervision/Assistance - 24 hour    Equipment Recommendations  3 in 1 bedside comode    Recommendations for Other Services       Precautions / Restrictions Precautions Precautions: Knee;Fall;Back Precaution Comments: Pt still with back precautions from prior surgery. Has brace that was provided to him by MD, however does not wear this and has bought a lumbar corset from the drug store that he has been occasionally wearing instead. Required Braces or Orthoses: Knee Immobilizer - Right;Spinal Brace Knee Immobilizer - Right: Other (comment) (MD orders for d/c of KI today, however kept on for mobility) Spinal Brace: Lumbar corset (pt does not have in room ) Restrictions Weight Bearing Restrictions: Yes RLE Weight Bearing: Weight bearing as tolerated Other Position/Activity Restrictions: KI on when walking      Mobility Bed Mobility Overal bed mobility: Needs Assistance Bed Mobility: Sit to Supine Rolling: Min assist Sidelying to sit: Min assist;+2 for physical assistance   Sit to supine: Mod assist   General bed mobility comments: Pt up in chair  Transfers Overall transfer level: Needs  assistance Equipment used: Rolling walker (2 wheeled) Transfers: Sit to/from [employer] Sit to Stand: Mod assist Stand pivot transfers: Min assist       General transfer comment: Mod A to power up. continued vc for sequencing and use of RW    Balance Overall balance assessment: Needs assistance Sitting-balance support: Feet supported;No upper extremity supported Sitting balance-Leahy Scale: Fair     Standing balance support: Bilateral upper extremity supported;During functional activity Standing balance-Leahy Scale: Poor Standing balance comment: Difficulty letting go of walker with 1 hand while up                            ADL Overall ADL's : Needs assistance/impaired     Grooming: Set up   Upper Body Bathing: Set up   Lower Body Bathing: Moderate assistance;Sit to/from stand   Upper Body Dressing : Set up;Supervision/safety   Lower Body Dressing: Maximal assistance;Sit to/from stand   Toilet Transfer: Moderate assistance   Toileting- Clothing Manipulation and Hygiene: Moderate assistance;Sit to/from stand       Functional mobility during ADLs: Moderate assistance;Cueing for safety;Cueing for sequencing;Rolling walker General ADL Comments: Pt states he would be alone during the day. Daughter in law would be available in the evenings and limited on the weekends.     Vision     Perception     Praxis      Pertinent Vitals/Pain Pain Assessment: 0-10 Pain Score: 6  Faces Pain Scale: Hurts a little bit Pain Location: back and  knee Pain Descriptors / Indicators: Aching Pain Intervention(s): Limited activity within patient's tolerance;Monitored during session     Hand Dominance Right   Extremity/Trunk Assessment Upper Extremity Assessment Upper Extremity Assessment: Overall WFL for tasks assessed (hx of R RTC issues; c/o chronic elbow tendonitis)   Lower Extremity Assessment Lower Extremity Assessment: Defer to PT  evaluation RLE Deficits / Details: Decreased AROM and acute pain consistent with infection and I&D. Knee immobilizer donned for increased support during mobility, however per MD orders is discontinued; in progress note, MD says to wear KI when up and walking   Cervical / Trunk Assessment Cervical / Trunk Assessment: Normal   Communication Communication Communication: No difficulties   Cognition Arousal/Alertness: Awake/alert Behavior During Therapy: WFL for tasks assessed/performed Overall Cognitive Status: Within Functional Limits for tasks assessed                     General Comments       Exercises       Shoulder Instructions      Home Living Family/patient expects to be discharged to:: Private residence Living Arrangements: Children Available Help at Discharge: Family;Available PRN/intermittently Type of Home: House Home Access: Stairs to enter [employer] of Steps: 4 Entrance Stairs-Rails: Right;Left;Can reach both Home Layout: One level     Bathroom Shower/Tub: Producer, television/film/video: Handicapped height Bathroom Accessibility: Yes How Accessible: Accessible via walker Home Equipment: Walker - 2 wheels;Shower seat - built in          Prior Functioning/Environment Level of Independence: Independent        Comments: Pt states he was independent with everything PTA and was not using his walker or the brace MD ordered. Pt got a lumbar corset from drug store which it sounds like he does not wear all the time either. . drives    OT Diagnosis: Generalized weakness;Acute pain   OT Problem List: Decreased strength;Decreased range of motion;Decreased activity tolerance;Impaired balance (sitting and/or standing);Decreased safety awareness;Decreased knowledge of use of DME or AE;Pain;Increased edema   OT Treatment/Interventions: Self-care/ADL training;Therapeutic exercise;Energy conservation;DME and/or AE instruction;Therapeutic  activities;Patient/family education;Balance training    OT Goals(Current goals can be found in the care plan section) Acute Rehab OT Goals Patient Stated Goal: Be able to go on a work trip in September OT Goal Formulation: With patient Time For Goal Achievement: 06/30/15 Potential to Achieve Goals: Good ADL Goals Pt Will Perform Lower Body Bathing: with min guard assist;with adaptive equipment;sit to/from stand Pt Will Perform Lower Body Dressing: with min guard assist;with adaptive equipment;sit to/from stand Pt Will Transfer to Toilet: with min guard assist;ambulating;bedside commode Pt Will Perform Toileting - Clothing Manipulation and hygiene: with min guard assist;sit to/from stand  OT Frequency: Min 2X/week   Barriers to D/C: Decreased caregiver support          Co-evaluation              End of Session Equipment Utilized During Treatment: Gait belt;Rolling walker;Right knee immobilizer Nurse Communication: Mobility status  Activity Tolerance: Patient tolerated treatment well Patient left: in bed;with bed alarm set;with call bell/phone within reach   Time: 1210-1247 OT Time Calculation (min): 37 min Charges:  OT General Charges $OT Visit: 1 Procedure OT Evaluation $Initial OT Evaluation Tier I: 1 Procedure OT Treatments $Self Care/Home Management : 8-22 mins G-Codes:    ,HILLARY 02-Jul-2015, 1:18 PM   [facility], OTR/L  [phone] 07-02-15

## 2015-06-16 NOTE — Clinical Social Work Note (Signed)
Clinical Social Work Assessment  Patient Details  Name: David Mathews MRN: [id number] Date of Birth: 1941/03/09  Date of referral:  06/16/15               Reason for consult:  Facility Placement                Permission sought to share information with:  Facility Industrial/product designer granted to share information::     Name::        Agency::  [facility] for referral purposes  Relationship::     Contact Information:     Housing/Transportation Living arrangements for the past 2 months:  Single Family Home Source of Information:  Patient Patient Interpreter Needed:  None Criminal Activity/Legal Involvement Pertinent to Current Situation/Hospitalization:  No - Comment as needed Significant Relationships:  Adult Children Lives with:  Adult Children Do you feel safe going back to the place where you live?  No (Mobility issues) Need for family participation in patient care:  No (Coment)  Care giving concerns:  Therapy recommendation for SNF and per notes pt in agreement   Social Worker assessment / plan:  CSW visited pt room to discuss SNF recommendation. Pt informed CSW he was aware of recommendation and him and his children are in agreement with need for ST rehab. Pt informed CSW his daughter worked for [employer] and pt and family would like for pt to be able to dc to [place] SNF but he is only 10% service connected and is aware that makes him ineligible. Pt informed CSW a facility in [place] would be between his son and daughter so this will likely be the preferred location for SNF. However, his daughter lives in [place] and he would not be opposed to facility in this area. CSW did explain that more pts in this area dc to [facility] than [facility] so it is more likely that we hear from [place] facilities. Pt understanding and informed CSW that if he would prefer to dc to [facility] his daughter will assist with finding facility and getting contact  information to CSW.  Employment status:  Retired Health and safety inspector:  [employer] PT Recommendations:  Skilled Holiday representative, Inpatient Rehab Consult Information / Referral to community resources:  Skilled Nursing Facility  Patient/Family's Response to care:  Pt agreeable and understanding of need for ST rehab  Patient/Family's Understanding of and Emotional Response to Diagnosis, Current Treatment, and Prognosis:  Pt with good understanding of medical condition. Pt with appropriate emotional response to hospitalization. Pt is happy to be making progress but frustrated with having to be hospitalized.   Emotional Assessment Appearance:  Appears stated age, Well-Groomed Attitude/Demeanor/Rapport:  Other (Cooperative) Affect (typically observed):  Calm, Pleasant, Accepting Orientation:  Oriented to Self, Oriented to Place, Oriented to  Time, Oriented to Situation Alcohol / Substance use:  Not Applicable Psych involvement (Current and /or in the community):  No (Comment)  Discharge Needs  Concerns to be addressed:  Discharge Planning Concerns Readmission within the last 30 days:  No Current discharge risk:  Dependent with Mobility Barriers to Discharge:  Continued Medical Work up   [employer] [phone]

## 2015-06-16 NOTE — Progress Notes (Signed)
Pt Synovial fluid cultures results: abundant WBC PM (poly/mono), gram positive cocci and cluster.

## 2015-06-16 NOTE — Progress Notes (Signed)
Physical Therapy Evaluation   06/16/15 1130  PT Visit Information  Last PT Received On 06/16/15  Assistance Needed +2  History of Present Illness Pt is a 74 y/o male with a PMH significant for B partial TKA in 2010, back surgery earlier this month. Pt presents with R knee infection and underwent I&D (WBAT). Pt also noted to have a non-healed lumbar wound from previous surgery and pt was taken for I&D of this area as well.   Precautions  Precautions Knee;Fall;Back  Precaution Comments Pt still with back precautions from prior surgery. Has brace that was provided to him by MD, however does not wear this and has bought a lumbar corset from the drug store that he has been occasionally wearing instead.  Required Braces or Orthoses Knee Immobilizer - Right;Spinal Brace  Knee Immobilizer - Right Other (comment) (MD orders for d/c of KI today, however kept on for mobility)  Spinal Brace Lumbar corset (pt does not have in room )  Restrictions  Weight Bearing Restrictions Yes  RLE Weight Bearing WBAT  Home Living  Family/patient expects to be discharged to: Private residence  Living Arrangements Children  Available Help at Discharge Family;Available PRN/intermittently  Type of Home House  Home Access Stairs to enter  Entrance Stairs-Number of Steps 4  Entrance Stairs-Rails Right;Left;Can reach both  Home Layout One level  Bathroom Shower/Tub Walk-in shower  Bathroom Toilet Handicapped height  Bathroom Accessibility Yes  Home Equipment [place] - 2 wheels;Shower seat - built in  Prior Function  Level of Independence Independent  Comments Pt states he was independent with everything PTA and was not using his walker or the brace MD ordered. Pt got a lumbar corset from drug store which it sounds like he does not wear all the time either.   Communication  Communication No difficulties  Pain Assessment  Pain Assessment Faces  Faces Pain Scale 2  Pain Location R Knee  Pain Descriptors /  Indicators Operative site guarding  Pain Intervention(s) Limited activity within patient's tolerance;Monitored during session;Repositioned  Cognition  Arousal/Alertness Awake/alert  Behavior During Therapy WFL for tasks assessed/performed  Overall Cognitive Status Within Functional Limits for tasks assessed  Upper Extremity Assessment  Upper Extremity Assessment Defer to OT evaluation  Lower Extremity Assessment  Lower Extremity Assessment RLE deficits/detail  RLE Deficits / Details Decreased AROM and acute pain consistent with infection and I&D. Knee immobilizer donned for increased support during mobility, however per MD orders is discontinued.   Cervical / Trunk Assessment  Cervical / Trunk Assessment Normal  Bed Mobility  Overal bed mobility Needs Assistance;+2 for physical assistance  Bed Mobility Rolling;Sidelying to Sit  Rolling Min assist  Sidelying to sit Min assist;+2 for physical assistance  General bed mobility comments Assist required for pt to complete full rolling and elevate trunk to full sitting position. Pt required increased cueing to maintain back precautions during log roll technique.  Transfers  Overall transfer level Needs assistance  Equipment used Rolling walker (2 wheeled)  Transfers Sit to/from [employer] Transfers  Sit to Stand Mod assist;+2 physical assistance;From elevated surface  Stand pivot transfers Min assist;+2 physical assistance  General transfer comment Pt was able to power-up to full standing position with +2 assist for balance and support. Pt requires increased time to achieve full standing and VC's for sequencing, maintaining back precautions, and general safety awareness. Pt with increased difficulty completing SPT to chair, and required specific sequencing cues for pt to turn and back up to the chair.  Balance  Overall balance assessment Needs assistance  Sitting-balance support Feet supported;No upper extremity supported  Sitting  balance-Leahy Scale Fair  Standing balance support Bilateral upper extremity supported;During functional activity  Standing balance-Leahy Scale Poor  Standing balance comment +2 assist required to maintain balance during dynamic activity.   PT - End of Session  Equipment Utilized During Treatment Gait belt;Oxygen  Activity Tolerance Patient limited by fatigue  Patient left in chair;with chair alarm set;with call bell/phone within reach;with family/visitor present  Nurse Communication Mobility status  PT Assessment  PT Therapy Diagnosis  Difficulty walking;Generalized weakness  PT Recommendation/Assessment Patient needs continued PT services  PT Problem List Decreased strength;Decreased activity tolerance;Decreased range of motion;Decreased balance;Decreased mobility;Decreased knowledge of use of DME;Decreased safety awareness;Decreased knowledge of precautions;Pain  Barriers to Discharge Decreased caregiver support  Barriers to Discharge Comments Pt will be alone at home at times  PT Plan  PT Frequency (ACUTE ONLY) Min 3X/week  PT Treatment/Interventions (ACUTE ONLY) DME instruction;Gait training;Stair training;Functional mobility training;Therapeutic activities;Therapeutic exercise;Neuromuscular re-education;Patient/family education  PT Recommendation  Follow Up Recommendations CIR;Supervision/Assistance - 24 hour  Individuals Consulted  Consulted and Agree with Results and Recommendations Patient;Family member/caregiver  Family Member Consulted Daughter  Acute Rehab PT Goals  Patient Stated Goal Be able to go on a work trip in September  PT Goal Formulation With patient/family  Time For Goal Achievement 06/23/15  Potential to Achieve Goals Good  PT Time Calculation  PT Start Time (ACUTE ONLY) 1015  PT Stop Time (ACUTE ONLY) 1054  PT Time Calculation (min) (ACUTE ONLY) 39 min  PT General Charges  $$ ACUTE PT VISIT 1 Procedure  PT Evaluation  $Initial PT Evaluation Tier I 1  Procedure  PT Treatments  $Therapeutic Activity 23-37 mins  Written Expression  Dominant Hand Right   Assessment: Pt admitted with above diagnosis. Pt currently with functional limitations due to the deficits listed below (see PT Problem List). At the time of PT eval pt was able to perform transfers with +2 assist. PTA pt was independent with ADL's and mobility. As the patient does not have 24 hour assist at home (children work during the day), feel pt will need short term rehab to improve functional independence and decrease risk of falls prior to return home. This patient is a good candidate for CIR to return to a mod I level as quickly as possible, as pt still travels for work and has trips planned in the near future. Pt will benefit from skilled PT to increase their independence and safety with mobility to allow discharge to the venue listed below.    Conni Slipper, PT, DPT Acute Rehabilitation Services Pager: [phone]

## 2015-06-17 DIAGNOSIS — B9561 Methicillin susceptible Staphylococcus aureus infection as the cause of diseases classified elsewhere: Secondary | ICD-10-CM

## 2015-06-17 DIAGNOSIS — I10 Essential (primary) hypertension: Secondary | ICD-10-CM

## 2015-06-17 DIAGNOSIS — N400 Enlarged prostate without lower urinary tract symptoms: Secondary | ICD-10-CM

## 2015-06-17 LAB — CBC WITH DIFFERENTIAL/PLATELET
Basophils Absolute: 0 10*3/uL (ref 0.0–0.1)
Basophils Relative: 0 % (ref 0–1)
EOS ABS: 0.2 10*3/uL (ref 0.0–0.7)
EOS PCT: 2 % (ref 0–5)
HCT: 28.6 % — ABNORMAL LOW (ref 39.0–52.0)
Hemoglobin: 9.7 g/dL — ABNORMAL LOW (ref 13.0–17.0)
Lymphocytes Relative: 12 % (ref 12–46)
Lymphs Abs: 1.5 10*3/uL (ref 0.7–4.0)
MCH: 29.8 pg (ref 26.0–34.0)
MCHC: 33.9 g/dL (ref 30.0–36.0)
MCV: 87.7 fL (ref 78.0–100.0)
Monocytes Absolute: 0.9 10*3/uL (ref 0.1–1.0)
Monocytes Relative: 8 % (ref 3–12)
NEUTROS PCT: 78 % — AB (ref 43–77)
Neutro Abs: 9.7 10*3/uL — ABNORMAL HIGH (ref 1.7–7.7)
Platelets: 505 10*3/uL — ABNORMAL HIGH (ref 150–400)
RBC: 3.26 MIL/uL — ABNORMAL LOW (ref 4.22–5.81)
RDW: 14.1 % (ref 11.5–15.5)
WBC: 12.3 10*3/uL — ABNORMAL HIGH (ref 4.0–10.5)

## 2015-06-17 LAB — GLUCOSE, CAPILLARY
GLUCOSE-CAPILLARY: 233 mg/dL — AB (ref 65–99)
GLUCOSE-CAPILLARY: 139 mg/dL — AB (ref 65–99)
Glucose-Capillary: 154 mg/dL — ABNORMAL HIGH (ref 65–99)
Glucose-Capillary: 228 mg/dL — ABNORMAL HIGH (ref 65–99)

## 2015-06-17 LAB — BASIC METABOLIC PANEL
Anion gap: 7 (ref 5–15)
BUN: 9 mg/dL (ref 6–20)
CALCIUM: 8.4 mg/dL — AB (ref 8.9–10.3)
CO2: 27 mmol/L (ref 22–32)
CREATININE: 0.88 mg/dL (ref 0.61–1.24)
Chloride: 96 mmol/L — ABNORMAL LOW (ref 101–111)
GFR calc non Af Amer: >60 mL/min (ref >60)
Glucose, Bld: 252 mg/dL — ABNORMAL HIGH (ref 65–99)
Potassium: 4.8 mmol/L (ref 3.5–5.1)
Sodium: 130 mmol/L — ABNORMAL LOW (ref 135–145)

## 2015-06-17 LAB — URINE CULTURE: CULTURE: NO GROWTH

## 2015-06-17 LAB — VANCOMYCIN, TROUGH: VANCOMYCIN TR: 16 ug/mL (ref 10.0–20.0)

## 2015-06-17 MED ORDER — RIFAMPIN 300 MG PO CAPS
300.0000 mg | ORAL_CAPSULE | Freq: Two times a day (BID) | ORAL | Status: DC
  Administered 2015-06-17 – 2015-06-18 (×2): 300 mg via ORAL
  Filled 2015-06-17 (×3): qty 1

## 2015-06-17 MED ORDER — CEFAZOLIN SODIUM-DEXTROSE 2-3 GM-% IV SOLR
2.0000 g | Freq: Three times a day (TID) | INTRAVENOUS | Status: DC
  Administered 2015-06-17 – 2015-06-18 (×2): 2 g via INTRAVENOUS
  Filled 2015-06-17 (×6): qty 50

## 2015-06-17 MED ORDER — SODIUM CHLORIDE 1 G PO TABS
1.0000 g | ORAL_TABLET | Freq: Three times a day (TID) | ORAL | Status: DC
  Administered 2015-06-17 – 2015-06-18 (×4): 1 g via ORAL
  Filled 2015-06-17 (×6): qty 1

## 2015-06-17 MED ORDER — FUROSEMIDE 10 MG/ML IJ SOLN
20.0000 mg | Freq: Once | INTRAMUSCULAR | Status: AC
  Administered 2015-06-17: 20 mg via INTRAVENOUS
  Filled 2015-06-17: qty 2

## 2015-06-17 MED ORDER — INSULIN GLARGINE 100 UNIT/ML ~~LOC~~ SOLN
60.0000 [IU] | Freq: Every day | SUBCUTANEOUS | Status: DC
  Administered 2015-06-17 – 2015-06-18 (×2): 60 [IU] via SUBCUTANEOUS
  Filled 2015-06-17 (×2): qty 0.6

## 2015-06-17 NOTE — Progress Notes (Addendum)
ANTIBIOTIC CONSULT NOTE - FOLLOW UP  Pharmacy Consult for Vancomycin >> Cefazolin Indication: infected prosthetic knee  No Known Allergies  Patient Measurements: Height: 5\' 9"  (175.3 cm) Weight: 195 lb 1.7 oz (88.5 kg) IBW/kg (Calculated) : 70.7  Vital Signs: Temp: 99 F (37.2 C) (07/28 0613) Temp Source: Oral (07/28 1610) BP: 140/79 mmHg (07/28 9604) Pulse Rate: 111 (07/28 0613) Intake/Output from previous day: 07/27 0701 - 07/28 0700 In: 802 [P.O.:802] Out: 1620 [Urine:1500; Drains:120] Intake/Output from this shift: Total I/O In: 2553.8 [I.V.:2303.8; IV Piggyback:250] Out: 3060 [Urine:3000; Drains:60]  Labs:  Recent Labs  06/15/15 0504 06/16/15 0539 06/16/15 0808 06/17/15 0543  WBC 19.0* 19.5*  --  12.3*  HGB 10.2* 10.6*  --  9.7*  PLT 423* 521*  --  505*  LABCREA  --   --  31.22  --   CREATININE 0.98 0.93  --  0.88   Estimated Creatinine Clearance: 82.3 mL/min (by C-G formula based on Cr of 0.88).  Recent Labs  06/17/15 0935  VANCOTROUGH 16     Microbiology: Recent Results (from the past 720 hour(s))  Body fluid culture     Status: None (Preliminary result)   Collection Time: 06/15/15 10:34 AM  Result Value Ref Range Status   Specimen Description FLUID KNEE RIGHT SYNOVIAL  Final   Special Requests Normal  Final   Gram Stain   Final    ABUNDANT WBC PRESENT, PREDOMINANTLY PMN FEW GRAM POSITIVE COCCI IN CLUSTERS GRAM STAIN REVIEWED-AGREE WITH RESULT M VESTAL    Culture MODERATE STAPHYLOCOCCUS AUREUS  Final   Report Status PENDING  Incomplete   Organism ID, Bacteria STAPHYLOCOCCUS AUREUS  Final      Susceptibility   Staphylococcus aureus - MIC*    CIPROFLOXACIN <=0.5 SENSITIVE Sensitive     ERYTHROMYCIN <=0.25 SENSITIVE Sensitive     GENTAMICIN <=0.5 SENSITIVE Sensitive     OXACILLIN <=0.25 SENSITIVE Sensitive     TETRACYCLINE <=1 SENSITIVE Sensitive     VANCOMYCIN <=0.5 SENSITIVE Sensitive     TRIMETH/SULFA <=10 SENSITIVE Sensitive      CLINDAMYCIN <=0.25 SENSITIVE Sensitive     RIFAMPIN <=0.5 SENSITIVE Sensitive     Inducible Clindamycin NEGATIVE Sensitive     * MODERATE STAPHYLOCOCCUS AUREUS  Surgical pcr screen     Status: Abnormal   Collection Time: 06/15/15  1:50 PM  Result Value Ref Range Status   MRSA, PCR NEGATIVE NEGATIVE Final   Staphylococcus aureus POSITIVE (A) NEGATIVE Final    Comment:        The Xpert SA Assay (FDA approved for NASAL specimens in patients over 22 years of age), is one component of a comprehensive surveillance program.  Test performance has been validated by [facility] for patients greater than or equal to 86 year old. It is not intended to diagnose infection nor to guide or monitor treatment.   Body fluid culture     Status: None (Preliminary result)   Collection Time: 06/15/15  8:13 PM  Result Value Ref Range Status   Specimen Description FLUID SYNOVIAL RIGHT KNEE  Final   Special Requests NONE  Final   Gram Stain   Final    ABUNDANT WBC PRESENT,BOTH PMN AND MONONUCLEAR RARE GRAM POSITIVE COCCI IN CLUSTERS RESULT CALLED TO, READ BACK BY AND VERIFIED WITH: A JETER,RN 06/16/15 AT 0208 RHOLMES CONFIRMED BY R GREEN    Culture TOO YOUNG TO READ  Final   Report Status PENDING  Incomplete  Tissue culture  Status: None (Preliminary result)   Collection Time: 06/15/15  8:13 PM  Result Value Ref Range Status   Specimen Description TISSUE  Final   Special Requests RIGHT KNEE SYNOVIUM PT ON ZOSYN,VANCOMYCIN  Final   Gram Stain   Final    RARE WBC PRESENT, PREDOMINANTLY MONONUCLEAR NO ORGANISMS SEEN Performed at [facility]    Culture PENDING  Incomplete   Report Status PENDING  Incomplete  Tissue culture     Status: None (Preliminary result)   Collection Time: 06/15/15  8:21 PM  Result Value Ref Range Status   Specimen Description TISSUE  Final   Special Requests   Final    RIGHT SYNOVIAL POSTERIOR KNEE PT ON ZOSYN,VANCOMYCIN   Gram Stain   Final    NO WBC  SEEN NO ORGANISMS SEEN Performed at [facility]    Culture PENDING  Incomplete   Report Status PENDING  Incomplete  Tissue culture     Status: None (Preliminary result)   Collection Time: 06/15/15  8:39 PM  Result Value Ref Range Status   Specimen Description TISSUE  Final   Special Requests RIGHT KNEE PT ON ZOSYN,VANCOMYCIN  Final   Gram Stain   Final    RARE WBC PRESENT,BOTH PMN AND MONONUCLEAR NO ORGANISMS SEEN Performed at [facility]    Culture PENDING  Incomplete   Report Status PENDING  Incomplete  Anaerobic culture     Status: None (Preliminary result)   Collection Time: 06/15/15  9:51 PM  Result Value Ref Range Status   Specimen Description WOUND RIGHT KNEE  Final   Special Requests NONE  Final   Gram Stain   Final    FEW WBC PRESENT,BOTH PMN AND MONONUCLEAR NO SQUAMOUS EPITHELIAL CELLS SEEN FEW GRAM POSITIVE COCCI IN PAIRS IN CLUSTERS Performed at [facility]    Culture   Final    NO ANAEROBES ISOLATED; CULTURE IN PROGRESS FOR 5 DAYS Performed at [facility]    Report Status PENDING  Incomplete  Wound culture     Status: None (Preliminary result)   Collection Time: 06/15/15  9:51 PM  Result Value Ref Range Status   Specimen Description WOUND RIGHT KNEE  Final   Special Requests ANTIBIOTIC ZOSYN AND VANCOMYCIN  Final   Gram Stain   Final    MODERATE WBC PRESENT,BOTH PMN AND MONONUCLEAR NO SQUAMOUS EPITHELIAL CELLS SEEN MODERATE GRAM POSITIVE COCCI IN PAIRS IN CLUSTERS Performed at [facility]    Culture   Final    MODERATE STAPHYLOCOCCUS AUREUS Note: RIFAMPIN AND GENTAMICIN SHOULD NOT BE USED AS SINGLE DRUGS FOR TREATMENT OF STAPH INFECTIONS. Performed at [facility]    Report Status PENDING  Incomplete   Assessment:  Day # 3 Vancomycin for septic prosthetic knee joint. POD#2 I&D and polyexchange of right knee and irrigation of lumbar wound (back surgery 05/20/15).   Zosyn stopped on 7/27 with GPC in  cultures. Synovial fluid culture from 7/26 grew MSSA. Multiple other cultures pending. ID following. Expecting 4-6 weeks IV antibiotics.   Vanc trough level today is 16 mcg/ml, at goal.   Tmax 99.1, WBC down to 12.3.  Creatinine stable, noted straight cath today, as intermittently at home.  Goal of Therapy:  Vancomycin trough level 15-20 mcg/ml  Plan:   Continue Vancomyciin 1250 mg IV q12hrs.  Follow renal function, culture data, and antibiotic plans.  Dennie Fetters , [place] Pager: [phone]  06/17/2015,12:02 PM  ADDN: Pharmacy is consulted to  transition vancomycin to cefazolin for prosthetic joint infection. MSSA found in body fluid culture from 06/15/15.   Plan: Cefazolin 2g IV q8h Monitor renal function and LOT  Arlean Hopping. Newman Pies, PharmD Clinical Pharmacist Pager [phone]

## 2015-06-17 NOTE — Progress Notes (Signed)
Patient ID: David Mathews, male   DOB: 1941-02-11, 74 y.o.   MRN: [id number] Afeb, vss No new neuro issues He feels his right leg may be more swollen. I did send fluid for culture at surgery the other night, and not sure where that report is. Will continue to follow.

## 2015-06-17 NOTE — Progress Notes (Signed)
[facility]  Date of Admission:  06/14/2015  Antibiotics: vancomycin  Subjective: Feels better, some foot swelling  Objective: Temp:  [98.5 F (36.9 C)-99.1 F (37.3 C)] 98.5 F (36.9 C) (07/28 1452) Pulse Rate:  [95-111] 102 (07/28 1452) Resp:  [18] 18 (07/28 1452) BP: (113-140)/(58-79) 113/58 mmHg (07/28 1452) SpO2:  [92 %-96 %] 92 % (07/28 1452)  General: awake, nad Skin: no rashes Lungs: CTA B Cor: RRR without m Abdomen: soft, nt, nd Ext: right leg wrapped, some edema noted in foot  Lab Results Lab Results  Component Value Date   WBC 12.3* 06/17/2015   HGB 9.7* 06/17/2015   HCT 28.6* 06/17/2015   MCV 87.7 06/17/2015   PLT 505* 06/17/2015    Lab Results  Component Value Date   CREATININE 0.88 06/17/2015   BUN 9 06/17/2015   NA 130* 06/17/2015   K 4.8 06/17/2015   CL 96* 06/17/2015   CO2 27 06/17/2015    Lab Results  Component Value Date   ALT 23 06/16/2015   AST 19 06/16/2015   ALKPHOS 143* 06/16/2015   BILITOT 0.7 06/16/2015      Microbiology: Recent Results (from the past 240 hour(s))  Body fluid culture     Status: None (Preliminary result)   Collection Time: 06/15/15 10:34 AM  Result Value Ref Range Status   Specimen Description FLUID KNEE RIGHT SYNOVIAL  Final   Special Requests Normal  Final   Gram Stain   Final    ABUNDANT WBC PRESENT, PREDOMINANTLY PMN FEW GRAM POSITIVE COCCI IN CLUSTERS GRAM STAIN REVIEWED-AGREE WITH RESULT M VESTAL    Culture MODERATE STAPHYLOCOCCUS AUREUS  Final   Report Status PENDING  Incomplete   Organism ID, Bacteria STAPHYLOCOCCUS AUREUS  Final      Susceptibility   Staphylococcus aureus - MIC*    CIPROFLOXACIN <=0.5 SENSITIVE Sensitive     ERYTHROMYCIN <=0.25 SENSITIVE Sensitive     GENTAMICIN <=0.5 SENSITIVE Sensitive     OXACILLIN <=0.25 SENSITIVE Sensitive     TETRACYCLINE <=1 SENSITIVE Sensitive     VANCOMYCIN <=0.5 SENSITIVE Sensitive     TRIMETH/SULFA <=10 SENSITIVE  Sensitive     CLINDAMYCIN <=0.25 SENSITIVE Sensitive     RIFAMPIN <=0.5 SENSITIVE Sensitive     Inducible Clindamycin NEGATIVE Sensitive     * MODERATE STAPHYLOCOCCUS AUREUS  Surgical pcr screen     Status: Abnormal   Collection Time: 06/15/15  1:50 PM  Result Value Ref Range Status   MRSA, PCR NEGATIVE NEGATIVE Final   Staphylococcus aureus POSITIVE (A) NEGATIVE Final    Comment:        The Xpert SA Assay (FDA approved for NASAL specimens in patients over 7 years of age), is one component of a comprehensive surveillance program.  Test performance has been validated by [facility] for patients greater than or equal to 61 year old. It is not intended to diagnose infection nor to guide or monitor treatment.   Body fluid culture     Status: None (Preliminary result)   Collection Time: 06/15/15  8:13 PM  Result Value Ref Range Status   Specimen Description FLUID SYNOVIAL RIGHT KNEE  Final   Special Requests NONE  Final   Gram Stain   Final    ABUNDANT WBC PRESENT,BOTH PMN AND MONONUCLEAR RARE GRAM POSITIVE COCCI IN CLUSTERS RESULT CALLED TO, READ BACK BY AND VERIFIED WITH: A JETER,RN 06/16/15 AT 1610 RHOLMES CONFIRMED BY R GREEN    Culture  Final    MODERATE STAPHYLOCOCCUS AUREUS SUSCEPTIBILITIES PERFORMED ON PREVIOUS CULTURE WITHIN THE LAST 5 DAYS.    Report Status PENDING  Incomplete  Tissue culture     Status: None (Preliminary result)   Collection Time: 06/15/15  8:13 PM  Result Value Ref Range Status   Specimen Description TISSUE  Final   Special Requests RIGHT KNEE SYNOVIUM PT ON ZOSYN,VANCOMYCIN  Final   Gram Stain   Final    RARE WBC PRESENT, PREDOMINANTLY MONONUCLEAR NO ORGANISMS SEEN Performed at [facility]    Culture   Final    Culture reincubated for better growth Performed at [facility]    Report Status PENDING  Incomplete  Tissue culture     Status: None (Preliminary result)   Collection Time: 06/15/15  8:21 PM  Result Value Ref  Range Status   Specimen Description TISSUE  Final   Special Requests   Final    RIGHT SYNOVIAL POSTERIOR KNEE PT ON ZOSYN,VANCOMYCIN   Gram Stain   Final    NO WBC SEEN NO ORGANISMS SEEN Performed at [facility]    Culture   Final    FEW STAPHYLOCOCCUS SPECIES Performed at [facility]    Report Status PENDING  Incomplete  Tissue culture     Status: None (Preliminary result)   Collection Time: 06/15/15  8:39 PM  Result Value Ref Range Status   Specimen Description TISSUE  Final   Special Requests RIGHT KNEE PT ON ZOSYN,VANCOMYCIN  Final   Gram Stain   Final    RARE WBC PRESENT,BOTH PMN AND MONONUCLEAR NO ORGANISMS SEEN Performed at [facility]   Final    Culture reincubated for better growth Performed at [facility]    Report Status PENDING  Incomplete  Anaerobic culture     Status: None (Preliminary result)   Collection Time: 06/15/15  9:51 PM  Result Value Ref Range Status   Specimen Description WOUND RIGHT KNEE  Final   Special Requests NONE  Final   Gram Stain   Final    FEW WBC PRESENT,BOTH PMN AND MONONUCLEAR NO SQUAMOUS EPITHELIAL CELLS SEEN FEW GRAM POSITIVE COCCI IN PAIRS IN CLUSTERS Performed at [facility]    Culture   Final    NO ANAEROBES ISOLATED; CULTURE IN PROGRESS FOR 5 DAYS Performed at [facility]    Report Status PENDING  Incomplete  Wound culture     Status: None (Preliminary result)   Collection Time: 06/15/15  9:51 PM  Result Value Ref Range Status   Specimen Description WOUND RIGHT KNEE  Final   Special Requests ANTIBIOTIC ZOSYN AND VANCOMYCIN  Final   Gram Stain   Final    MODERATE WBC PRESENT,BOTH PMN AND MONONUCLEAR NO SQUAMOUS EPITHELIAL CELLS SEEN MODERATE GRAM POSITIVE COCCI IN PAIRS IN CLUSTERS Performed at [facility]    Culture   Final    MODERATE STAPHYLOCOCCUS AUREUS Note: RIFAMPIN AND GENTAMICIN SHOULD NOT BE USED AS SINGLE DRUGS FOR TREATMENT OF  STAPH INFECTIONS. Performed at [facility]    Report Status PENDING  Incomplete  Urine culture     Status: None   Collection Time: 06/16/15  8:16 AM  Result Value Ref Range Status   Specimen Description URINE, RANDOM  Final   Special Requests NONE  Final   Culture NO GROWTH 1 DAY  Final   Report Status 06/17/2015 FINAL  Final  Culture, blood (routine x  2)     Status: None (Preliminary result)   Collection Time: 06/16/15 12:03 PM  Result Value Ref Range Status   Specimen Description BLOOD RIGHT ANTECUBITAL  Final   Special Requests BOTTLES DRAWN AEROBIC AND ANAEROBIC  10CC EACH  Final   Culture NO GROWTH 1 DAY  Final   Report Status PENDING  Incomplete  Culture, blood (routine x 2)     Status: None (Preliminary result)   Collection Time: 06/16/15 12:16 PM  Result Value Ref Range Status   Specimen Description BLOOD RIGHT ARM  Final   Special Requests   Final    BOTTLES DRAWN AEROBIC AND ANAEROBIC 10CC BLUE TOP 5CC RED TOP   Culture NO GROWTH 1 DAY  Final   Report Status PENDING  Incomplete    Studies/Results: Dg Knee Right Port  06/16/2015   CLINICAL DATA:  Postop right knee.  EXAM: PORTABLE RIGHT KNEE - 1-2 VIEW  COMPARISON:  Radiographs yesterday.  FINDINGS: Medial compartment hemi arthroplasty in expected alignment. Surgical drain is in place. Recent postsurgical change includes joint effusion with air, decreased size of fusion from prior exam. Soft tissue air again seen posteriorly and in the proximal lower leg.  IMPRESSION: Decreased joint effusion with postsurgical drains in place. Medial compartment hemiarthroplasty in expected alignment.   Electronically Signed   By: Rubye Oaks M.D.   On: 06/16/2015 02:33    Assessment/Plan:  1) PJI s/p polyexchange - MSSA growing in culture.  Will switch to cefazolin and add rifampin for 6 weeks through Sept 5th then keflex or augmentin/cef for 3-6 months.   Tolerating well Weekly cbc, cmp to [facility] We will arrange follow up  with [place], though he may go to [place] and [facility] [place] D[place], Molly Maduro, MD [email or web address] [phone] pager   [phone] cell 06/17/2015, 4:03 PM

## 2015-06-17 NOTE — Progress Notes (Signed)
     Subjective:  Patient reports pain as moderate.  Difficulty ambulting, positive chills, no fever. Doesn't feel ready to go.    Objective:   VITALS:   Filed Vitals:   06/16/15 1030 06/16/15 1430 06/16/15 2237 06/17/15 0613  BP: 97/50 123/51 120/60 140/79  Pulse: 109 110 95 111  Temp: 98.3 F (36.8 C)  99.1 F (37.3 C) 99 F (37.2 C)  TempSrc: Oral  Oral Oral  Resp: Height:      Weight:      SpO2: 96% 94% 96% 94%    Neurologically intact Dorsiflexion/Plantar flexion intact Incision: no drainage Drains removed.  Dressings changed. Significant leg swelling right greater than left.   Left ring finger with some redness at prox phalanx, very stiff, but all fingers are, no fusiform swelling, mild pain with passive extension.  Lab Results  Component Value Date   WBC 12.3* 06/17/2015   HGB 9.7* 06/17/2015   HCT 28.6* 06/17/2015   MCV 87.7 06/17/2015   PLT 505* 06/17/2015   BMET    Component Value Date/Time   NA 130* 06/17/2015 0543   K 4.8 06/17/2015 0543   CL 96* 06/17/2015 0543   CO2 27 06/17/2015 0543   GLUCOSE 252* 06/17/2015 0543   BUN 9 06/17/2015 0543   CREATININE 0.88 06/17/2015 0543   CALCIUM 8.4* 06/17/2015 0543   GFRNONAA >60 06/17/2015 0543   GFRAA >60 06/17/2015 0543     Assessment/Plan: 2 Days Post-Op   Principal Problem:   Infected prosthetic knee joint, right unicompartmental knee  Active Problems:   Spinal stenosis, lumbar   Hypothyroidism   Uncontrolled type 2 diabetes mellitus   HTN (hypertension)   BPH (benign prostatic hyperplasia)   History of back surgery   Advance diet Up with therapy PICC, PT, IV ABX for MSSA\  Monitor finger for development of Flexor Tendon Synovitis, although no evidence for that currently.    Possible DC tomorrow per primary team.    I will be out of town tomorrow, Dr. Wandra Feinstein covering for me.  RTC with me in 1 week after discharge.   , P 06/17/2015, 1:34 PM   Teryl Lucy, MD Cell [phone]

## 2015-06-17 NOTE — Progress Notes (Addendum)
Patient Demographics:    David Mathews, is a 74 y.o. male, DOB - January 06, 1941, ZOX:[id number]  Admit date - 06/14/2015   Admitting Physician Bobette Mo, MD  Outpatient Primary MD for the patient is [name]  LOS - 3   Chief Complaint  Patient presents with  . DVT    The patient said Dr. Theophilus Kinds sent him here for a possible DVT.  He does have leg swelling and redness.  He denies any injury to the right leg other than "straining" to sit on the toilet.          Subjective:    [name] today has, No headache, No chest pain, No abdominal pain - No Nausea, No new weakness tingling or numbness, No Cough - SOB.  Has pain in the right knee.   Assessment  & Plan :       1. Right knee swelling. 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 Status post incision and drainage by Dr. Dion Saucier on 06/15/2015, wound cultures growing MSSA, currently on vancomycin and will defer for ID to recommend appropriate anti-biotic with course and duration, will place PICC line if blood cultures remain negative today. He likely seeded from his L-spine postsurgical wound infection. L-spine postsurgical wound was drained in the OR on 06/15/2015 by Dr. Gerlene Fee.  Discussed his case with both orthopedics and neurosurgery on 06/16/2015. Stable from both standpoint and can be discharged from their standpoint, pending TTE.   2. BPH with urinary retention. On alpha-blocker and finasteride continue. Will do straight cath which patient does himself off and on at home, if requiring straight cath more than twice we'll place Foley.   3. Dyslipidemia. Continue home dose statin.    4. Essential hypertension - blood pressure soft hold ACE and monitor.   5. Hypothyroidism. On Synthroid continued at home dose.    6.  Hyponatremia. Due to dehydration improved with normal saline. Now third spaced some fluid mostly in his right leg gentle Lasix 1   7. Hyperkalemia. Resolved.   8. DM type II. Poor outpatient control, have increased his Lantus dose, and it male over log along with sliding scale.   Lab Results  Component Value Date   HGBA1C 9.4* 06/15/2015    CBG (last 3)   Recent Labs  06/16/15 1703 06/16/15 2314 06/17/15 0757  GLUCAP 186* 253* 233*       Code Status : Full  Family Communication  : None present  Disposition Plan  : To be decided  Consults  :  Orthopedics  Procedures  :   Right knee incision and drainage by Dr. Dion Saucier 06/15/2015  L-spine superficial drainage by Dr. Gerlene Fee 06/15/2015  DVT Prophylaxis  :  Lovenox    Lab Results  Component Value Date   PLT 505* 06/17/2015    Inpatient Medications  Scheduled Meds: . alfuzosin  10 mg Oral Q breakfast  . aspirin EC  81 mg Oral Daily  . docusate sodium  100 mg Oral BID  . enoxaparin (LOVENOX) injection  30 mg Subcutaneous Q12H  . finasteride  5 mg Oral Daily  . insulin aspart  0-15 Units Subcutaneous TID WC  . insulin aspart  0-5 Units Subcutaneous QHS  . insulin aspart  4 Units  Subcutaneous TID WC  . insulin glargine  60 Units Subcutaneous Daily  . levothyroxine  88 mcg Oral QAC breakfast  . pregabalin  150 mg Oral BID  . senna  1 tablet Oral BID  . simvastatin  20 mg Oral Daily  . vancomycin  1,250 mg Intravenous Q12H   Continuous Infusions:   PRN Meds:.acetaminophen **OR** acetaminophen, alum & mag hydroxide-simeth, bisacodyl, HYDROmorphone (DILAUDID) injection, [DISCONTINUED] ondansetron **OR** ondansetron (ZOFRAN) IV, oxyCODONE, polyethylene glycol, traMADol  Antibiotics  :     Anti-infectives    Start     Dose/Rate Route Frequency Ordered Stop   06/15/15 2149  50,000 units bacitracin in 0.9% normal saline 250 mL irrigation  Status:  Discontinued       As needed 06/15/15 2150 06/15/15 2229    06/15/15 1000  vancomycin (VANCOCIN) 1,250 mg in sodium chloride 0.9 % 250 mL IVPB     1,250 mg 166.7 mL/hr over 90 Minutes Intravenous Every 12 hours 06/15/15 0038     06/15/15 0115  piperacillin-tazobactam (ZOSYN) IVPB 3.375 g  Status:  Discontinued     3.375 g 12.5 mL/hr over 240 Minutes Intravenous 3 times per day 06/15/15 0032 06/16/15 1049   06/15/15 0100  vancomycin (VANCOCIN) 1,500 mg in sodium chloride 0.9 % 500 mL IVPB     1,500 mg 250 mL/hr over 120 Minutes Intravenous  Once 06/15/15 0038 06/15/15 0358   06/15/15 0032  vancomycin (VANCOCIN) IVPB 1000 mg/200 mL premix  Status:  Discontinued     1,000 mg 200 mL/hr over 60 Minutes Intravenous Every 12 hours 06/15/15 0032 06/15/15 0036   06/14/15 2300  clindamycin (CLEOCIN) IVPB 600 mg     600 mg 100 mL/hr over 30 Minutes Intravenous  Once 06/14/15 2246 06/14/15 2337        Objective:   Filed Vitals:   06/16/15 1030 06/16/15 1430 06/16/15 2237 06/17/15 0613  BP: 97/50 123/51 120/60 140/79  Pulse: 109 110 95 111  Temp: 98.3 F (36.8 C)  99.1 F (37.3 C) 99 F (37.2 C)  TempSrc: Oral  Oral Oral  Resp: Height:      Weight:      SpO2: 96% 94% 96% 94%    Wt Readings from Last 3 Encounters:  06/15/15 88.5 kg (195 lb 1.7 oz)  05/20/15 89.359 kg (197 lb)  05/17/15 89.359 kg (197 lb)     Intake/Output Summary (Last 24 hours) at 06/17/15 1025 Last data filed at 06/17/15 0914  Gross per 24 hour  Intake 2895.75 ml  Output   2140 ml  Net 755.75 ml     Physical Exam  Awake Alert, Oriented X 3, No new F.N deficits, Normal affect [place].AT,PERRAL Supple Neck,No JVD, No cervical lymphadenopathy appriciated.  Symmetrical Chest wall movement, Good air movement bilaterally, CTAB RRR,No Gallops,Rubs or new Murmurs, No Parasternal Heave +ve B.Sounds, Abd Soft, No tenderness, No organomegaly appriciated, No rebound - guarding or rigidity. No Cyanosis, Clubbing, 1+ edema mostly in the right leg, No new Rash or  bruise Right knee has postop drain & bandage, minimal surrounding warmth and redness, has a drain in his L-spine as well    Data Review:   Micro Results Recent Results (from the past 240 hour(s))  Body fluid culture     Status: None (Preliminary result)   Collection Time: 06/15/15 10:34 AM  Result Value Ref Range Status   Specimen Description FLUID KNEE RIGHT SYNOVIAL  Final   Special Requests Normal  Final   Gram Stain   Final    ABUNDANT WBC PRESENT, PREDOMINANTLY PMN FEW GRAM POSITIVE COCCI IN CLUSTERS GRAM STAIN REVIEWED-AGREE WITH RESULT M VESTAL    Culture MODERATE STAPHYLOCOCCUS AUREUS  Final   Report Status PENDING  Incomplete   Organism ID, Bacteria STAPHYLOCOCCUS AUREUS  Final      Susceptibility   Staphylococcus aureus - MIC*    CIPROFLOXACIN <=0.5 SENSITIVE Sensitive     ERYTHROMYCIN <=0.25 SENSITIVE Sensitive     GENTAMICIN <=0.5 SENSITIVE Sensitive     OXACILLIN <=0.25 SENSITIVE Sensitive     TETRACYCLINE <=1 SENSITIVE Sensitive     VANCOMYCIN <=0.5 SENSITIVE Sensitive     TRIMETH/SULFA <=10 SENSITIVE Sensitive     CLINDAMYCIN <=0.25 SENSITIVE Sensitive     RIFAMPIN <=0.5 SENSITIVE Sensitive     Inducible Clindamycin NEGATIVE Sensitive     * MODERATE STAPHYLOCOCCUS AUREUS  Surgical pcr screen     Status: Abnormal   Collection Time: 06/15/15  1:50 PM  Result Value Ref Range Status   MRSA, PCR NEGATIVE NEGATIVE Final   Staphylococcus aureus POSITIVE (A) NEGATIVE Final    Comment:        The Xpert SA Assay (FDA approved for NASAL specimens in patients over 7 years of age), is one component of a comprehensive surveillance program.  Test performance has been validated by [facility] for patients greater than or equal to 86 year old. It is not intended to diagnose infection nor to guide or monitor treatment.   Body fluid culture     Status: None (Preliminary result)   Collection Time: 06/15/15  8:13 PM  Result Value Ref Range Status   Specimen  Description FLUID SYNOVIAL RIGHT KNEE  Final   Special Requests NONE  Final   Gram Stain   Final    ABUNDANT WBC PRESENT,BOTH PMN AND MONONUCLEAR RARE GRAM POSITIVE COCCI IN CLUSTERS RESULT CALLED TO, READ BACK BY AND VERIFIED WITH: A JETER,RN 06/16/15 AT 0208 RHOLMES CONFIRMED BY R GREEN    Culture TOO YOUNG TO READ  Final   Report Status PENDING  Incomplete  Tissue culture     Status: None (Preliminary result)   Collection Time: 06/15/15  8:13 PM  Result Value Ref Range Status   Specimen Description TISSUE  Final   Special Requests RIGHT KNEE SYNOVIUM PT ON ZOSYN,VANCOMYCIN  Final   Gram Stain   Final    RARE WBC PRESENT, PREDOMINANTLY MONONUCLEAR NO ORGANISMS SEEN Performed at [facility]    Culture PENDING  Incomplete   Report Status PENDING  Incomplete  Tissue culture     Status: None (Preliminary result)   Collection Time: 06/15/15  8:21 PM  Result Value Ref Range Status   Specimen Description TISSUE  Final   Special Requests   Final    RIGHT SYNOVIAL POSTERIOR KNEE PT ON ZOSYN,VANCOMYCIN   Gram Stain   Final    NO WBC SEEN NO ORGANISMS SEEN Performed at [facility]    Culture PENDING  Incomplete   Report Status PENDING  Incomplete  Tissue culture     Status: None (Preliminary result)   Collection Time: 06/15/15  8:39 PM  Result Value Ref Range Status   Specimen Description TISSUE  Final   Special Requests RIGHT KNEE PT ON ZOSYN,VANCOMYCIN  Final   Gram Stain   Final    RARE WBC PRESENT,BOTH PMN AND MONONUCLEAR NO ORGANISMS SEEN Performed at [facility] PENDING  Incomplete   Report Status PENDING  Incomplete  Anaerobic culture     Status: None (Preliminary result)   Collection Time: 06/15/15  9:51 PM  Result Value Ref Range Status   Specimen Description WOUND RIGHT KNEE  Final   Special Requests NONE  Final   Gram Stain   Final    FEW WBC PRESENT,BOTH PMN AND MONONUCLEAR NO SQUAMOUS EPITHELIAL CELLS SEEN FEW GRAM  POSITIVE COCCI IN PAIRS IN CLUSTERS Performed at [facility]    Culture   Final    NO ANAEROBES ISOLATED; CULTURE IN PROGRESS FOR 5 DAYS Performed at [facility]    Report Status PENDING  Incomplete  Wound culture     Status: None (Preliminary result)   Collection Time: 06/15/15  9:51 PM  Result Value Ref Range Status   Specimen Description WOUND RIGHT KNEE  Final   Special Requests ANTIBIOTIC ZOSYN AND VANCOMYCIN  Final   Gram Stain   Final    MODERATE WBC PRESENT,BOTH PMN AND MONONUCLEAR NO SQUAMOUS EPITHELIAL CELLS SEEN MODERATE GRAM POSITIVE COCCI IN PAIRS IN CLUSTERS Performed at [facility]    Culture   Final    NO GROWTH 1 DAY Performed at [facility]    Report Status PENDING  Incomplete    Radiology Reports Dg Lumbar Spine 2-3 Views  05/20/2015   CLINICAL DATA:  L2-L3 PLIF, extension of instrumentation  EXAM: DG C-ARM 61-120 MIN; LUMBAR SPINE - 2-3 VIEW  COMPARISON:  Two intraoperative C-arm fluoroscopic images were obtained.  FLUOROSCOPY TIME:  0 minutes 26 seconds  Images obtained: 2  FINDINGS: Pairs of pedicle screws are identified at adjacent levels of lumbar spine.  Bones demineralized.  Disc prostheses present.  Indwelling pedicle screws and bars from prior lower lumbar fusion are also identified.  Based on identification of a rib on the lateral view, these pedicle screws are likely at L2 and L3 though this is not definitive due to lack of landmarks and lack of visualization of the entire lumbar spine.  IMPRESSION: Prior lower lumbar fusion.  Single level lumbar fusion question L2-L3 as above.   Electronically Signed   By: Ulyses Southward M.D.   On: 05/20/2015 16:03   Dg Knee 2 Views Right  06/14/2015   CLINICAL DATA:  Right knee pain and swelling. Elevated white blood cell count. History of right knee surgery 2011, 5 years prior.  EXAM: RIGHT KNEE - 1-2 VIEW  COMPARISON:  None.  FINDINGS: Post unilateral medial compartment  hemiarthroplasty. There is less than 2 mm lucency adjacent to the central aspect of the tibial component. No acute fracture or dislocation. No erosion or periosteal reaction. Subchondral irregularity involving the patellofemoral articulation consistent with osteoarthritis.  Large heterogeneous joint effusion containing questionable foci of air. Small lucent foci posterior to the distal femoral metaphysis may reflect air within posterior joint fluid. Additional lucent foci projecting in the posterior proximal lower leg. These foci in may reflect soft tissue air versus less likely confluent edema. Diffuse soft tissue edema.  IMPRESSION: Large joint effusion with questionable foci of intra-articular air, raising concern for septic joint. Probable foci of soft tissue air posterior to the distal femur as well as proximal lower leg. This may reflect air within joint effusion and elongated or ruptured Baker cyst versus less likely foci of more confluent soft tissue edema. No osseous destructive change.  These results were called by telephone at the time of interpretation on 06/14/2015 at 11:50 pm to Dr.  MATTHEW RIESTER , who verbally acknowledged these results.   Electronically Signed   By: Rubye Oaks M.D.   On: 06/14/2015 23:52   Dg Hand 2 View Left  06/14/2015   CLINICAL DATA:  Left ring finger swelling.  EXAM: LEFT HAND - 2 VIEW  COMPARISON:  None.  FINDINGS: Lateral view limited due to osseous overlap. Soft tissue edema about the ring finger. No evidence of fracture, dislocation, or bony destructive change. Multifocal osteoarthritis throughout the digits.  IMPRESSION: Soft tissue edema about the ring finger. No evidence of associated osseous abnormality allowing for limitations.   Electronically Signed   By: Rubye Oaks M.D.   On: 06/14/2015 23:53   Dg Knee Right Port  06/16/2015   CLINICAL DATA:  Postop right knee.  EXAM: PORTABLE RIGHT KNEE - 1-2 VIEW  COMPARISON:  Radiographs yesterday.  FINDINGS:  Medial compartment hemi arthroplasty in expected alignment. Surgical drain is in place. Recent postsurgical change includes joint effusion with air, decreased size of fusion from prior exam. Soft tissue air again seen posteriorly and in the proximal lower leg.  IMPRESSION: Decreased joint effusion with postsurgical drains in place. Medial compartment hemiarthroplasty in expected alignment.   Electronically Signed   By: Rubye Oaks M.D.   On: 06/16/2015 02:33   Dg C-arm 1-60 Min  05/20/2015   CLINICAL DATA:  L2-L3 PLIF, extension of instrumentation  EXAM: DG C-ARM 61-120 MIN; LUMBAR SPINE - 2-3 VIEW  COMPARISON:  Two intraoperative C-arm fluoroscopic images were obtained.  FLUOROSCOPY TIME:  0 minutes 26 seconds  Images obtained: 2  FINDINGS: Pairs of pedicle screws are identified at adjacent levels of lumbar spine.  Bones demineralized.  Disc prostheses present.  Indwelling pedicle screws and bars from prior lower lumbar fusion are also identified.  Based on identification of a rib on the lateral view, these pedicle screws are likely at L2 and L3 though this is not definitive due to lack of landmarks and lack of visualization of the entire lumbar spine.  IMPRESSION: Prior lower lumbar fusion.  Single level lumbar fusion question L2-L3 as above.   Electronically Signed   By: Ulyses Southward M.D.   On: 05/20/2015 16:03     CBC  Recent Labs Lab 06/14/15 2152 06/15/15 0504 06/16/15 0539 06/17/15 0543  WBC 23.5* 19.0* 19.5* 12.3*  HGB 11.0* 10.2* 10.6* 9.7*  HCT 31.6* 29.3* 31.1* 28.6*  PLT 459* 423* 521* 505*  MCV 86.6 86.7 87.4 87.7  MCH 30.1 30.2 29.8 29.8  MCHC 34.8 34.8 34.1 33.9  RDW 13.7 13.9 14.1 14.1  LYMPHSABS 1.0 1.4 0.6* 1.5  MONOABS 1.2* 1.0 0.6 0.9  EOSABS 0.1 0.1 0.0 0.2  BASOSABS 0.0 0.0 0.0 0.0    Chemistries   Recent Labs Lab 06/14/15 2152 06/15/15 0504 06/16/15 0539 06/17/15 0543  NA 121* 125* 123* 130*  K 4.9 4.7 5.4* 4.8  CL 85* 87* 87* 96*  CO2 25 29 26 27     GLUCOSE 472* 314* 244* 252*  BUN 9 11 9 9   CREATININE 1.02 0.98 0.93 0.88  CALCIUM 8.7* 8.6* 8.4* 8.4*  AST  --  13* 19  --   ALT  --  23 23  --   ALKPHOS  --  151* 143*  --   BILITOT  --  0.7 0.7  --    ------------------------------------------------------------------------------------------------------------------ estimated creatinine clearance is 82.3 mL/min (by C-G formula based on Cr of 0.88). ------------------------------------------------------------------------------------------------------------------  Recent Labs  06/15/15 1140  HGBA1C 9.4*   ------------------------------------------------------------------------------------------------------------------  No results for input(s): CHOL, HDL, LDLCALC, TRIG, CHOLHDL, LDLDIRECT in the last 72 hours. ------------------------------------------------------------------------------------------------------------------ No results for input(s): TSH, T4TOTAL, T3FREE, THYROIDAB in the last 72 hours.  Invalid input(s): FREET3 ------------------------------------------------------------------------------------------------------------------ No results for input(s): VITAMINB12, FOLATE, FERRITIN, TIBC, IRON, RETICCTPCT in the last 72 hours.  Coagulation profile No results for input(s): INR, PROTIME in the last 168 hours.  No results for input(s): DDIMER in the last 72 hours.  Cardiac Enzymes No results for input(s): CKMB, TROPONINI, MYOGLOBIN in the last 168 hours.  Invalid input(s): CK ------------------------------------------------------------------------------------------------------------------ Invalid input(s): POCBNP   Time Spent in minutes   35   , K M.D on 06/17/2015 at 10:25 AM  Between 7am to 7pm - Pager - [phone]  After 7pm go to [email or web address] - password [email or web address]  [facility] -  Office  [phone]

## 2015-06-17 NOTE — Progress Notes (Signed)
Dr. Thedore Mins made aware of patient's bladder scan results of great than , order for in and out cath placed and urine returned. Order to give ordered lasix and monitor for urinary retention and to in and out cath if >375ml and if retention continues to place urinary catheter for acute urinary retention.

## 2015-06-17 NOTE — Care Management Important Message (Signed)
Important Message  Patient Details  Name: David Mathews MRN: [id number] Date of Birth: 10/06/1941   Medicare Important Message Given:  Yes-second notification given    Kyla Balzarine 06/17/2015, 1:32 PMImportant Message  Patient Details  Name: David Mathews MRN: [id number] Date of Birth: 1940-12-31   Medicare Important Message Given:  Yes-second notification given    Kyla Balzarine 06/17/2015, 1:32 PM

## 2015-06-18 ENCOUNTER — Inpatient Hospital Stay (HOSPITAL_COMMUNITY): Payer: Medicare Other

## 2015-06-18 DIAGNOSIS — M4806 Spinal stenosis, lumbar region: Secondary | ICD-10-CM

## 2015-06-18 DIAGNOSIS — E11649 Type 2 diabetes mellitus with hypoglycemia without coma: Secondary | ICD-10-CM | POA: Diagnosis not present

## 2015-06-18 DIAGNOSIS — T84042S Periprosthetic fracture around internal prosthetic right knee joint, sequela: Secondary | ICD-10-CM | POA: Diagnosis not present

## 2015-06-18 DIAGNOSIS — R2689 Other abnormalities of gait and mobility: Secondary | ICD-10-CM | POA: Diagnosis not present

## 2015-06-18 DIAGNOSIS — T814XXA Infection following a procedure, initial encounter: Secondary | ICD-10-CM | POA: Diagnosis not present

## 2015-06-18 DIAGNOSIS — T8453XA Infection and inflammatory reaction due to internal right knee prosthesis, initial encounter: Secondary | ICD-10-CM | POA: Diagnosis not present

## 2015-06-18 DIAGNOSIS — I509 Heart failure, unspecified: Secondary | ICD-10-CM | POA: Diagnosis not present

## 2015-06-18 DIAGNOSIS — E1165 Type 2 diabetes mellitus with hyperglycemia: Secondary | ICD-10-CM | POA: Diagnosis not present

## 2015-06-18 DIAGNOSIS — M1990 Unspecified osteoarthritis, unspecified site: Secondary | ICD-10-CM | POA: Diagnosis not present

## 2015-06-18 DIAGNOSIS — L03115 Cellulitis of right lower limb: Secondary | ICD-10-CM | POA: Diagnosis not present

## 2015-06-18 DIAGNOSIS — Z9889 Other specified postprocedural states: Secondary | ICD-10-CM | POA: Diagnosis not present

## 2015-06-18 DIAGNOSIS — I10 Essential (primary) hypertension: Secondary | ICD-10-CM | POA: Diagnosis not present

## 2015-06-18 DIAGNOSIS — E039 Hypothyroidism, unspecified: Secondary | ICD-10-CM | POA: Diagnosis not present

## 2015-06-18 DIAGNOSIS — N400 Enlarged prostate without lower urinary tract symptoms: Secondary | ICD-10-CM | POA: Diagnosis not present

## 2015-06-18 DIAGNOSIS — N401 Enlarged prostate with lower urinary tract symptoms: Secondary | ICD-10-CM | POA: Diagnosis not present

## 2015-06-18 DIAGNOSIS — E785 Hyperlipidemia, unspecified: Secondary | ICD-10-CM | POA: Diagnosis not present

## 2015-06-18 DIAGNOSIS — Z96651 Presence of right artificial knee joint: Secondary | ICD-10-CM | POA: Diagnosis not present

## 2015-06-18 DIAGNOSIS — B9561 Methicillin susceptible Staphylococcus aureus infection as the cause of diseases classified elsewhere: Secondary | ICD-10-CM | POA: Diagnosis not present

## 2015-06-18 DIAGNOSIS — Z741 Need for assistance with personal care: Secondary | ICD-10-CM | POA: Diagnosis not present

## 2015-06-18 DIAGNOSIS — M00061 Staphylococcal arthritis, right knee: Secondary | ICD-10-CM | POA: Diagnosis not present

## 2015-06-18 DIAGNOSIS — T847XXD Infection and inflammatory reaction due to other internal orthopedic prosthetic devices, implants and grafts, subsequent encounter: Secondary | ICD-10-CM | POA: Diagnosis not present

## 2015-06-18 DIAGNOSIS — M6281 Muscle weakness (generalized): Secondary | ICD-10-CM | POA: Diagnosis not present

## 2015-06-18 DIAGNOSIS — E871 Hypo-osmolality and hyponatremia: Secondary | ICD-10-CM | POA: Diagnosis not present

## 2015-06-18 DIAGNOSIS — R6520 Severe sepsis without septic shock: Secondary | ICD-10-CM | POA: Diagnosis not present

## 2015-06-18 LAB — TISSUE CULTURE: Gram Stain: NONE SEEN

## 2015-06-18 LAB — BODY FLUID CULTURE: Special Requests: NORMAL

## 2015-06-18 LAB — WOUND CULTURE

## 2015-06-18 LAB — GLUCOSE, CAPILLARY
GLUCOSE-CAPILLARY: 173 mg/dL — AB (ref 65–99)
GLUCOSE-CAPILLARY: 179 mg/dL — AB (ref 65–99)

## 2015-06-18 MED ORDER — RIFAMPIN 300 MG PO CAPS: 300.0000 mg | ORAL_CAPSULE | Freq: Two times a day (BID) | ORAL | Status: DC

## 2015-06-18 MED ORDER — TRAMADOL HCL 50 MG PO TABS: 50.0000 mg | ORAL_TABLET | Freq: Four times a day (QID) | ORAL | Status: DC | PRN

## 2015-06-18 MED ORDER — OXYCODONE HCL 5 MG PO TABS: 5.0000 mg | ORAL_TABLET | Freq: Four times a day (QID) | ORAL | Status: DC | PRN

## 2015-06-18 MED ORDER — DIAZEPAM 5 MG PO TABS: 5.0000 mg | ORAL_TABLET | Freq: Four times a day (QID) | ORAL | Status: DC | PRN

## 2015-06-18 MED ORDER — INSULIN GLARGINE 100 UNIT/ML SOLOSTAR PEN: 55.0000 [IU] | PEN_INJECTOR | Freq: Every day | SUBCUTANEOUS | Status: AC

## 2015-06-18 MED ORDER — SODIUM CHLORIDE 0.9 % IJ SOLN: 10.0000 mL | INTRAMUSCULAR | Status: DC | PRN

## 2015-06-18 MED ORDER — CEFAZOLIN SODIUM-DEXTROSE 2-3 GM-% IV SOLR: 2.0000 g | Freq: Three times a day (TID) | INTRAVENOUS | Status: DC

## 2015-06-18 NOTE — Discharge Instructions (Signed)
Follow with Primary MD Lonie Peak, PA-C in 7 days   Get CBC, CMP, 2 view Chest X ray checked  by Primary MD next visit.    Activity: Where right knee immobilizer when out of the bed at all times, weightbearing as tolerated with Full fall precautions use walker/cane & assistance as needed   Disposition SNF   Diet: Heart Healthy Low Carb.  Accuchecks 4 times/day, Once in AM empty stomach and then before each meal. Log in all results and show them to your Prim.MD in 3 days. If any glucose reading is under 80 or above 300 call your Prim MD immidiately. Follow Low glucose instructions for glucose under 80 as instructed.    For Heart failure patients - Check your Weight same time everyday, if you gain over 2 pounds, or you develop in leg swelling, experience more shortness of breath or chest pain, call your Primary MD immediately. Follow Cardiac Low Salt Diet and 1.5 lit/day fluid restriction.   On your next visit with your primary care physician please Get Medicines reviewed and adjusted.   Please request your Prim.MD to go over all Hospital Tests and Procedure/Radiological results at the follow up, please get all Hospital records sent to your Prim MD by signing hospital release before you go home.   If you experience worsening of your admission symptoms, develop shortness of breath, life threatening emergency, suicidal or homicidal thoughts you must seek medical attention immediately by calling 911 or calling your MD immediately  if symptoms less severe.  You Must read complete instructions/literature along with all the possible adverse reactions/side effects for all the Medicines you take and that have been prescribed to you. Take any new Medicines after you have completely understood and accpet all the possible adverse reactions/side effects.   Do not drive, operating heavy machinery, perform activities at heights, swimming or participation in water activities or provide baby  sitting services if your were admitted for syncope or siezures until you have seen by Primary MD or a Neurologist and advised to do so again.  Do not drive when taking Pain medications.    Do not take more than prescribed Pain, Sleep and Anxiety Medications  Special Instructions: If you have smoked or chewed Tobacco  in the last 2 yrs please stop smoking, stop any regular Alcohol  and or any Recreational drug use.  Wear Seat belts while driving.   Please note  You were cared for by a hospitalist during your hospital stay. If you have any questions about your discharge medications or the care you received while you were in the hospital after you are discharged, you can call the unit and asked to speak with the hospitalist on call if the hospitalist that took care of you is not available. Once you are discharged, your primary care physician will handle any further medical issues. Please note that NO REFILLS for any discharge medications will be authorized once you are discharged, as it is imperative that you return to your primary care physician (or establish a relationship with a primary care physician if you do not have one) for your aftercare needs so that they can reassess your need for medications and monitor your lab values.

## 2015-06-18 NOTE — Progress Notes (Signed)
Peripherally Inserted Central Catheter/Midline Placement  The IV Nurse has discussed with the patient and/or persons authorized to consent for the patient, the purpose of this procedure and the potential benefits and risks involved with this procedure.  The benefits include less needle sticks, lab draws from the catheter and patient may be discharged home with the catheter.  Risks include, but not limited to, infection, bleeding, blood clot (thrombus formation), and puncture of an artery; nerve damage and irregular heat beat.  Alternatives to this procedure were also discussed.  PICC/Midline Placement Documentation        David Mathews 06/18/2015, 7:58 AM

## 2015-06-18 NOTE — Discharge Summary (Addendum)
David Mathews, is a 74 y.o. male  DOB 1941-08-13  MRN [id number].  Admission date:  06/14/2015  Admitting Physician  Bobette Mo, MD  Discharge Date:  06/18/2015   Primary MD  [name]  Recommendations for primary care physician for things to follow:   Monitor CBC, CMP in a week.  Monitor glycemic control closely   Admission Diagnosis  Cellulitis of right lower extremity [L03.115]   Discharge Diagnosis  Cellulitis of right lower extremity [L03.115]     Principal Problem:   Infected prosthetic knee joint, right unicompartmental knee  Active Problems:   Spinal stenosis, lumbar   Hypothyroidism   Uncontrolled type 2 diabetes mellitus   HTN (hypertension)   BPH (benign prostatic hyperplasia)   History of back surgery      Past Medical History  Diagnosis Date  . Hypothyroidism   . Diabetes mellitus without complication   . Arthritis   . Infected prosthetic knee joint, right unicompartmental knee  06/14/2015    Past Surgical History  Procedure Laterality Date  . Back surgery  10/2010  . Partial knee arthroplasty Bilateral 09/2009  . Carpal tunnel release Left [id number]  . Carpal tunnel release Right 2009  . I&d extremity Right 06/15/2015    Procedure: IRRIGATION AND DEBRIDEMENT OF INFECTED PARTIAL RIGHT  KNEE AND POLY EXCHANGE, RIGHT KNEE SYNOVECTOMY;  Surgeon: Teryl Lucy, MD;  Location: [facility] OR;  Service: Orthopedics;  Laterality: Right;  . Lumbar wound debridement N/A 06/15/2015    Procedure: LUMBAR WOUND IRRIGATION AND DEBRIDEMENT;  Surgeon: Aliene Beams, MD;  Location: [facility] OR;  Service: Neurosurgery;  Laterality: N/A;       HPI  from the history and physical done on the day of admission:     David Mathews is a 74 y.o. male with below past medical history who comes to  the ER referred by Dr. Theophilus Kinds after he went to his office for a post-op follow-up of back surgery for spinal stenosis that he had on June 30th. Once he was evaluated, he was sent to the emergency department for evaluation of DVT. The patient states that his right lower extremity has been progressively swelling worse over the past 7 days, along with decreased range of motion and tenderness. He denies any specific trauma to the area. He complains of chills, but denies fevers.  He is currently in no acute distress    Hospital Course:     1. Right knee swelling. 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 Status post incision and drainage by Dr. Dion Saucier on 06/15/2015, wound cultures growing MSSA, currently on vancomycin and will defer for ID to recommend appropriate anti-biotic with course and duration, will place PICC line if blood cultures remain negative today. He likely seeded from his L-spine postsurgical wound infection. L-spine postsurgical wound was drained in the OR on 06/15/2015 by Dr. Gerlene Fee.  Discussed his case with both orthopedics and neurosurgery on 06/16/2015. Stable from both standpoint and can be discharged . He will be discharged on 6 weeks  of IV cefazolin and rifampin. Thereafter per ID physician Dr. Earle Gell he might require chronic suppressive oral and diabetic for 6 more months which could be Keflex or Augmentin. He must follow with ID, orthopedics and neurosurgery closely postdischarge. TTE was stable.  Left fourth finger swelling which is chronic, and discussed with Dr. Dion Saucier and his assistant mid-level provider Kingsley Plan. Chronic problem follow with Dr. Dion Saucier in the office no acute issues.   2. BPH with urinary retention. On alpha-blocker and finasteride continue. Does have intermittent issues with urinary retention and usually straight cath himself. Had one episode of retention here which has resolved after 1 straight cath yesterday morning.   3. Dyslipidemia. Continue home  dose statin.    4. Essential hypertension - continue home regimen unchanged.  TTE report -  - Left ventricle: The cavity size was normal. Wall thickness was normal. Systolic function was normal. The estimated ejectionfraction was in the range of 55% to 60%. Wall motion was normal;there were no regional wall motion abnormalities. Dopplerparameters are consistent with abnormal left ventricularrelaxation (grade 1 diastolic dysfunction). - Aortic valve: Valve area (VTI): 1.82 cm^2. Valve area (Vmax):1.79 cm^2. Valve area (Vmean): 1.64 cm^2. - Mitral valve: Calcified annulus. - Pericardium, extracardiac: A trivial pericardial effusion was identified.    5. Hypothyroidism. On Synthroid continued at home dose.    6. Hyponatremia. Due to dehydration improved with normal saline. Repeat CMP in a week.   7. Hyperkalemia. Resolved.   8. DM type II. Poor outpatient control, have increased his Lantus dose, continue sliding scale and monitor CBGs closely adjust as needed.   Lab Results  Component Value Date   HGBA1C 9.4* 06/15/2015    CBG (last 3)   Recent Labs  06/17/15 1741 06/17/15 2341 06/18/15 0816  GLUCAP 139* 154* 173*     Discharge Condition: Stable  Follow UP  Follow-up Information    Follow up with LANDAU,JOSHUA P, MD. Schedule an appointment as soon as possible for a visit in 1 week.   Specialty:  Orthopedic Surgery   Contact information:   [address] [place] [address] [phone]       Schedule an appointment as soon as possible for a visit with CONROY,NATHAN, PA-C.   Specialty:  Physician Assistant   Contact information:   [facility] [address] [place] [address] [phone]       Follow up with Staci Righter, MD. Schedule an appointment as soon as possible for a visit in 2 weeks.   Specialty:  Infectious Diseases   Why:  Right knee MSSA infection   Contact information:   [address] [place] [address] [phone]       Follow up with Reinaldo Meeker, MD. Schedule an appointment as soon as possible for a visit in 1 week.   Specialty:  Neurosurgery   Why:  L-spine surgery   Contact information:   [address] [place] [address] [phone]        Consults obtained - Ortho, ID  Diet and Activity recommendation: See Discharge Instructions below  Discharge Instructions           Discharge Instructions    Discharge instructions    Complete by:  As directed   Follow with Primary MD CONROY,NATHAN, PA-C in 7 days   Get CBC, CMP, 2 view Chest X ray checked  by Primary MD next visit.    Activity: Where right knee immobilizer when  out of the bed at all times, weightbearing as tolerated with Full fall precautions use walker/cane & assistance as needed   Disposition SNF   Diet: Heart Healthy Low Carb.  Accuchecks 4 times/day, Once in AM empty stomach and then before each meal. Log in all results and show them to your Prim.MD in 3 days. If any glucose reading is under 80 or above 300 call your Prim MD immidiately. Follow Low glucose instructions for glucose under 80 as instructed.    For Heart failure patients - Check your Weight same time everyday, if you gain over 2 pounds, or you develop in leg swelling, experience more shortness of breath or chest pain, call your Primary MD immediately. Follow Cardiac Low Salt Diet and 1.5 lit/day fluid restriction.   On your next visit with your primary care physician please Get Medicines reviewed and adjusted.   Please request your Prim.MD to go over all Hospital Tests and Procedure/Radiological results at the follow up, please get all Hospital records sent to your Prim MD by signing hospital release before you go home.   If you experience worsening of your admission symptoms, develop shortness of breath, life threatening emergency, suicidal or homicidal thoughts you must seek medical  attention immediately by calling 911 or calling your MD immediately  if symptoms less severe.  You Must read complete instructions/literature along with all the possible adverse reactions/side effects for all the Medicines you take and that have been prescribed to you. Take any new Medicines after you have completely understood and accpet all the possible adverse reactions/side effects.   Do not drive, operating heavy machinery, perform activities at heights, swimming or participation in water activities or provide baby sitting services if your were admitted for syncope or siezures until you have seen by Primary MD or a Neurologist and advised to do so again.  Do not drive when taking Pain medications.    Do not take more than prescribed Pain, Sleep and Anxiety Medications  Special Instructions: If you have smoked or chewed Tobacco  in the last 2 yrs please stop smoking, stop any regular Alcohol  and or any Recreational drug use.  Wear Seat belts while driving.   Please note  You were cared for by a hospitalist during your hospital stay. If you have any questions about your discharge medications or the care you received while you were in the hospital after you are discharged, you can call the unit and asked to speak with the hospitalist on call if the hospitalist that took care of you is not available. Once you are discharged, your primary care physician will handle any further medical issues. Please note that NO REFILLS for any discharge medications will be authorized once you are discharged, as it is imperative that you return to your primary care physician (or establish a relationship with a primary care physician if you do not have one) for your aftercare needs so that they can reassess your need for medications and monitor your lab values.     Increase activity slowly    Complete by:  As directed              Discharge Medications       Medication List    TAKE these medications         acetaminophen 500 MG tablet  Commonly known as:  TYLENOL  Take 1,000 mg by mouth every 8 (eight) hours as needed for mild pain.     aspirin EC 81  MG tablet  Take 81 mg by mouth daily.     ceFAZolin 2-3 GM-% Solr  Commonly known as:  ANCEF  Inject 50 mLs (2 g total) into the vein every 8 (eight) hours. Total of 6 weeks starting 06/18/2015     diazepam 5 MG tablet  Commonly known as:  VALIUM  Take 1 tablet (5 mg total) by mouth every 6 (six) hours as needed for muscle spasms.     diclofenac 75 MG EC tablet  Commonly known as:  VOLTAREN  Take 75 mg by mouth 2 (two) times daily.     finasteride 5 MG tablet  Commonly known as:  PROSCAR  Take 5 mg by mouth daily.     Insulin Glargine 100 UNIT/ML Solostar Pen  Commonly known as:  LANTUS SOLOSTAR  Inject 55 Units into the skin daily. Per sliding scale     levothyroxine 88 MCG tablet  Commonly known as:  SYNTHROID, LEVOTHROID  Take 88 mcg by mouth daily before breakfast.     lisinopril-hydrochlorothiazide 20-25 MG per tablet  Commonly known as:  PRINZIDE,ZESTORETIC  Take 1 tablet by mouth daily.     MAGNESIUM PO  Take 1 tablet by mouth daily.     MULTIVITAMIN PO  Take 1 tablet by mouth daily.     NOVOLOG FLEXPEN 100 UNIT/ML FlexPen  Generic drug:  insulin aspart  Inject 6-10 Units into the skin 3 (three) times daily with meals.     oxyCODONE 5 MG immediate release tablet  Commonly known as:  Oxy IR/ROXICODONE  Take 1 tablet (5 mg total) by mouth every 6 (six) hours as needed for breakthrough pain.     Potassium 99 MG Tabs  Take 1 tablet by mouth 2 (two) times daily.     pregabalin 150 MG capsule  Commonly known as:  LYRICA  Take 150 mg by mouth 2 (two) times daily.     rifampin 300 MG capsule  Commonly known as:  RIFADIN  Take 1 capsule (300 mg total) by mouth every 12 (twelve) hours. Total of 6 weeks starting 06/18/2015     simvastatin 20 MG tablet  Commonly known as:  ZOCOR  Take 20 mg by mouth daily.      traMADol 50 MG tablet  Commonly known as:  ULTRAM  Take 1 tablet (50 mg total) by mouth every 6 (six) hours as needed for moderate pain.     UROXATRAL 10 MG 24 hr tablet  Generic drug:  alfuzosin  Take 10 mg by mouth daily with breakfast.     VITAMIN B 12 PO  Take 1 tablet by mouth daily.     VITAMIN D PO  Take 1 tablet by mouth daily.        Major procedures and Radiology Reports - PLEASE review detailed and final reports for all details, in brief -    Right knee incision and drainage by Dr. Dion Saucier 06/15/2015  L-spine superficial drainage by Dr. Gerlene Fee 06/15/2015    Dg Lumbar Spine 2-3 Views  05/20/2015   CLINICAL DATA:  L2-L3 PLIF, extension of instrumentation  EXAM: DG C-ARM 61-120 MIN; LUMBAR SPINE - 2-3 VIEW  COMPARISON:  Two intraoperative C-arm fluoroscopic images were obtained.  FLUOROSCOPY TIME:  0 minutes 26 seconds  Images obtained: 2  FINDINGS: Pairs of pedicle screws are identified at adjacent levels of lumbar spine.  Bones demineralized.  Disc prostheses present.  Indwelling pedicle screws and bars from prior lower lumbar fusion are also identified.  Based on identification of a rib on the lateral view, these pedicle screws are likely at L2 and L3 though this is not definitive due to lack of landmarks and lack of visualization of the entire lumbar spine.  IMPRESSION: Prior lower lumbar fusion.  Single level lumbar fusion question L2-L3 as above.   Electronically Signed   By: Ulyses Southward M.D.   On: 05/20/2015 16:03   Dg Knee 2 Views Right  06/14/2015   CLINICAL DATA:  Right knee pain and swelling. Elevated white blood cell count. History of right knee surgery 2011, 5 years prior.  EXAM: RIGHT KNEE - 1-2 VIEW  COMPARISON:  None.  FINDINGS: Post unilateral medial compartment hemiarthroplasty. There is less than 2 mm lucency adjacent to the central aspect of the tibial component. No acute fracture or dislocation. No erosion or periosteal reaction. Subchondral irregularity  involving the patellofemoral articulation consistent with osteoarthritis.  Large heterogeneous joint effusion containing questionable foci of air. Small lucent foci posterior to the distal femoral metaphysis may reflect air within posterior joint fluid. Additional lucent foci projecting in the posterior proximal lower leg. These foci in may reflect soft tissue air versus less likely confluent edema. Diffuse soft tissue edema.  IMPRESSION: Large joint effusion with questionable foci of intra-articular air, raising concern for septic joint. Probable foci of soft tissue air posterior to the distal femur as well as proximal lower leg. This may reflect air within joint effusion and elongated or ruptured Baker cyst versus less likely foci of more confluent soft tissue edema. No osseous destructive change.  These results were called by telephone at the time of interpretation on 06/14/2015 at 11:50 pm to Dr. Tery Sanfilippo , who verbally acknowledged these results.   Electronically Signed   By: Rubye Oaks M.D.   On: 06/14/2015 23:52   Dg Hand 2 View Left  06/14/2015   CLINICAL DATA:  Left ring finger swelling.  EXAM: LEFT HAND - 2 VIEW  COMPARISON:  None.  FINDINGS: Lateral view limited due to osseous overlap. Soft tissue edema about the ring finger. No evidence of fracture, dislocation, or bony destructive change. Multifocal osteoarthritis throughout the digits.  IMPRESSION: Soft tissue edema about the ring finger. No evidence of associated osseous abnormality allowing for limitations.   Electronically Signed   By: Rubye Oaks M.D.   On: 06/14/2015 23:53   Dg Knee Right Port  06/16/2015   CLINICAL DATA:  Postop right knee.  EXAM: PORTABLE RIGHT KNEE - 1-2 VIEW  COMPARISON:  Radiographs yesterday.  FINDINGS: Medial compartment hemi arthroplasty in expected alignment. Surgical drain is in place. Recent postsurgical change includes joint effusion with air, decreased size of fusion from prior exam. Soft tissue  air again seen posteriorly and in the proximal lower leg.  IMPRESSION: Decreased joint effusion with postsurgical drains in place. Medial compartment hemiarthroplasty in expected alignment.   Electronically Signed   By: Rubye Oaks M.D.   On: 06/16/2015 02:33   Dg C-arm 1-60 Min  05/20/2015   CLINICAL DATA:  L2-L3 PLIF, extension of instrumentation  EXAM: DG C-ARM 61-120 MIN; LUMBAR SPINE - 2-3 VIEW  COMPARISON:  Two intraoperative C-arm fluoroscopic images were obtained.  FLUOROSCOPY TIME:  0 minutes 26 seconds  Images obtained: 2  FINDINGS: Pairs of pedicle screws are identified at adjacent levels of lumbar spine.  Bones demineralized.  Disc prostheses present.  Indwelling pedicle screws and bars from prior lower lumbar fusion are also identified.  Based on identification of a  rib on the lateral view, these pedicle screws are likely at L2 and L3 though this is not definitive due to lack of landmarks and lack of visualization of the entire lumbar spine.  IMPRESSION: Prior lower lumbar fusion.  Single level lumbar fusion question L2-L3 as above.   Electronically Signed   By: Ulyses Southward M.D.   On: 05/20/2015 16:03    Micro Results      Recent Results (from the past 240 hour(s))  Body fluid culture     Status: None (Preliminary result)   Collection Time: 06/15/15 10:34 AM  Result Value Ref Range Status   Specimen Description FLUID KNEE RIGHT SYNOVIAL  Final   Special Requests Normal  Final   Gram Stain   Final    ABUNDANT WBC PRESENT, PREDOMINANTLY PMN FEW GRAM POSITIVE COCCI IN CLUSTERS GRAM STAIN REVIEWED-AGREE WITH RESULT M VESTAL    Culture MODERATE STAPHYLOCOCCUS AUREUS  Final   Report Status PENDING  Incomplete   Organism ID, Bacteria STAPHYLOCOCCUS AUREUS  Final      Susceptibility   Staphylococcus aureus - MIC*    CIPROFLOXACIN <=0.5 SENSITIVE Sensitive     ERYTHROMYCIN <=0.25 SENSITIVE Sensitive     GENTAMICIN <=0.5 SENSITIVE Sensitive     OXACILLIN <=0.25 SENSITIVE  Sensitive     TETRACYCLINE <=1 SENSITIVE Sensitive     VANCOMYCIN <=0.5 SENSITIVE Sensitive     TRIMETH/SULFA <=10 SENSITIVE Sensitive     CLINDAMYCIN <=0.25 SENSITIVE Sensitive     RIFAMPIN <=0.5 SENSITIVE Sensitive     Inducible Clindamycin NEGATIVE Sensitive     * MODERATE STAPHYLOCOCCUS AUREUS  Surgical pcr screen     Status: Abnormal   Collection Time: 06/15/15  1:50 PM  Result Value Ref Range Status   MRSA, PCR NEGATIVE NEGATIVE Final   Staphylococcus aureus POSITIVE (A) NEGATIVE Final    Comment:        The Xpert SA Assay (FDA approved for NASAL specimens in patients over 28 years of age), is one component of a comprehensive surveillance program.  Test performance has been validated by [facility] for patients greater than or equal to 93 year old. It is not intended to diagnose infection nor to guide or monitor treatment.   Body fluid culture     Status: None (Preliminary result)   Collection Time: 06/15/15  8:13 PM  Result Value Ref Range Status   Specimen Description FLUID SYNOVIAL RIGHT KNEE  Final   Special Requests NONE  Final   Gram Stain   Final    ABUNDANT WBC PRESENT,BOTH PMN AND MONONUCLEAR RARE GRAM POSITIVE COCCI IN CLUSTERS RESULT CALLED TO, READ BACK BY AND VERIFIED WITH: A JETER,RN 06/16/15 AT 0208 RHOLMES CONFIRMED BY R GREEN    Culture   Final    MODERATE STAPHYLOCOCCUS AUREUS SUSCEPTIBILITIES PERFORMED ON PREVIOUS CULTURE WITHIN THE LAST 5 DAYS.    Report Status PENDING  Incomplete  Tissue culture     Status: None (Preliminary result)   Collection Time: 06/15/15  8:13 PM  Result Value Ref Range Status   Specimen Description TISSUE  Final   Special Requests RIGHT KNEE SYNOVIUM PT ON ZOSYN,VANCOMYCIN  Final   Gram Stain   Final    RARE WBC PRESENT, PREDOMINANTLY MONONUCLEAR NO ORGANISMS SEEN Performed at [facility]    Culture   Final    Culture reincubated for better growth Performed at [facility]    Report Status  PENDING  Incomplete  Tissue culture  Status: None   Collection Time: 06/15/15  8:21 PM  Result Value Ref Range Status   Specimen Description TISSUE  Final   Special Requests   Final    RIGHT SYNOVIAL POSTERIOR KNEE PT ON ZOSYN,VANCOMYCIN   Gram Stain   Final    NO WBC SEEN NO ORGANISMS SEEN Performed at [facility]    Culture   Final    FEW STAPHYLOCOCCUS AUREUS Performed at [facility]    Report Status 06/18/2015 FINAL  Final   Organism ID, Bacteria STAPHYLOCOCCUS AUREUS  Final      Susceptibility   Staphylococcus aureus - MIC*    CLINDAMYCIN <=0.25 SENSITIVE Sensitive     ERYTHROMYCIN <=0.25 SENSITIVE Sensitive     GENTAMICIN <=0.5 SENSITIVE Sensitive     LEVOFLOXACIN <=0.12 SENSITIVE Sensitive     OXACILLIN <=0.25 SENSITIVE Sensitive     RIFAMPIN <=0.5 SENSITIVE Sensitive     TRIMETH/SULFA <=10 SENSITIVE Sensitive     VANCOMYCIN 1 SENSITIVE Sensitive     TETRACYCLINE <=1 SENSITIVE Sensitive     MOXIFLOXACIN <=0.25 SENSITIVE Sensitive     * FEW STAPHYLOCOCCUS AUREUS  Tissue culture     Status: None (Preliminary result)   Collection Time: 06/15/15  8:39 PM  Result Value Ref Range Status   Specimen Description TISSUE  Final   Special Requests RIGHT KNEE PT ON ZOSYN,VANCOMYCIN  Final   Gram Stain   Final    RARE WBC PRESENT,BOTH PMN AND MONONUCLEAR NO ORGANISMS SEEN Performed at [facility]    Culture   Final    Culture reincubated for better growth Performed at [facility]    Report Status PENDING  Incomplete  Anaerobic culture     Status: None (Preliminary result)   Collection Time: 06/15/15  9:51 PM  Result Value Ref Range Status   Specimen Description WOUND RIGHT KNEE  Final   Special Requests NONE  Final   Gram Stain   Final    FEW WBC PRESENT,BOTH PMN AND MONONUCLEAR NO SQUAMOUS EPITHELIAL CELLS SEEN FEW GRAM POSITIVE COCCI IN PAIRS IN CLUSTERS Performed at [facility]    Culture   Final    NO ANAEROBES  ISOLATED; CULTURE IN PROGRESS FOR 5 DAYS Performed at [facility]    Report Status PENDING  Incomplete  Wound culture     Status: None   Collection Time: 06/15/15  9:51 PM  Result Value Ref Range Status   Specimen Description WOUND RIGHT KNEE  Final   Special Requests ANTIBIOTIC ZOSYN AND VANCOMYCIN  Final   Gram Stain   Final    MODERATE WBC PRESENT,BOTH PMN AND MONONUCLEAR NO SQUAMOUS EPITHELIAL CELLS SEEN MODERATE GRAM POSITIVE COCCI IN PAIRS IN CLUSTERS Performed at [facility]    Culture   Final    MODERATE STAPHYLOCOCCUS AUREUS Note: RIFAMPIN AND GENTAMICIN SHOULD NOT BE USED AS SINGLE DRUGS FOR TREATMENT OF STAPH INFECTIONS. Performed at [facility]    Report Status 06/18/2015 FINAL  Final   Organism ID, Bacteria STAPHYLOCOCCUS AUREUS  Final      Susceptibility   Staphylococcus aureus - MIC*    CLINDAMYCIN <=0.25 SENSITIVE Sensitive     ERYTHROMYCIN <=0.25 SENSITIVE Sensitive     GENTAMICIN <=0.5 SENSITIVE Sensitive     LEVOFLOXACIN 0.25 SENSITIVE Sensitive     OXACILLIN 0.5 SENSITIVE Sensitive     RIFAMPIN <=0.5 SENSITIVE Sensitive     TRIMETH/SULFA <=10 SENSITIVE Sensitive     VANCOMYCIN 1  SENSITIVE Sensitive     TETRACYCLINE <=1 SENSITIVE Sensitive     MOXIFLOXACIN <=0.25 SENSITIVE Sensitive     * MODERATE STAPHYLOCOCCUS AUREUS  Urine culture     Status: None   Collection Time: 06/16/15  8:16 AM  Result Value Ref Range Status   Specimen Description URINE, RANDOM  Final   Special Requests NONE  Final   Culture NO GROWTH 1 DAY  Final   Report Status 06/17/2015 FINAL  Final  Culture, blood (routine x 2)     Status: None (Preliminary result)   Collection Time: 06/16/15 12:03 PM  Result Value Ref Range Status   Specimen Description BLOOD RIGHT ANTECUBITAL  Final   Special Requests BOTTLES DRAWN AEROBIC AND ANAEROBIC  10CC EACH  Final   Culture NO GROWTH 1 DAY  Final   Report Status PENDING  Incomplete  Culture, blood (routine x 2)      Status: None (Preliminary result)   Collection Time: 06/16/15 12:16 PM  Result Value Ref Range Status   Specimen Description BLOOD RIGHT ARM  Final   Special Requests   Final    BOTTLES DRAWN AEROBIC AND ANAEROBIC 10CC BLUE TOP 5CC RED TOP   Culture NO GROWTH 1 DAY  Final   Report Status PENDING  Incomplete    Today   Subjective    David Mathews today has no headache,no chest abdominal pain,no new weakness tingling or numbness, feels much better.    Objective   Blood pressure 141/58, pulse 107, temperature 98 F (36.7 C), temperature source Oral, resp. rate 19, height  (1.753 m), weight 88.5 kg (195 lb 1.7 oz), SpO2 95 %.   Intake/Output Summary (Last 24 hours) at 06/18/15 1043 Last data filed at 06/18/15 1610  Gross per 24 hour  Intake    600 ml  Output   3270 ml  Net  -2670 ml    Exam Awake Alert, Oriented x 3, No new F.N deficits, Normal affect [place].AT,PERRAL Supple Neck,No JVD, No cervical lymphadenopathy appriciated.  Symmetrical Chest wall movement, Good air movement bilaterally, CTAB RRR,No Gallops,Rubs or new Murmurs, No Parasternal Heave +ve B.Sounds, Abd Soft, Non tender, No organomegaly appriciated, No rebound -guarding or rigidity. No Cyanosis, Clubbing or edema, No new Rash or bruise,   R Knee under bandage, L spine drain in place will be removed soon, L. 4th finder has minimal swelling and redness   Data Review   CBC w Diff:  Lab Results  Component Value Date   WBC 12.3* 06/17/2015   HGB 9.7* 06/17/2015   HCT 28.6* 06/17/2015   PLT 505* 06/17/2015   LYMPHOPCT 12 06/17/2015   MONOPCT 8 06/17/2015   EOSPCT 2 06/17/2015   BASOPCT 0 06/17/2015    CMP:  Lab Results  Component Value Date   NA 130* 06/17/2015   K 4.8 06/17/2015   CL 96* 06/17/2015   CO2 27 06/17/2015   BUN 9 06/17/2015   CREATININE 0.88 06/17/2015   PROT 6.0* 06/16/2015   ALBUMIN 1.9* 06/16/2015   BILITOT 0.7 06/16/2015   ALKPHOS 143* 06/16/2015   AST 19 06/16/2015    ALT 23 06/16/2015  .   Total Time in preparing paper work, data evaluation and todays exam - 35 minutes  Leroy Sea M.D on 06/18/2015 at 10:43 AM  [facility]   Office  [phone]

## 2015-06-18 NOTE — Care Management Note (Signed)
Case Management Note  Patient Details  Name: COLUMBUS ICE MRN: [id number] Date of Birth: [birth year]  Subjective/Objective:     Patient is for dc to snf today, CSW following                Action/Plan:   Expected Discharge Date:                  Expected Discharge Plan:  Skilled Nursing Facility  In-House Referral:  Clinical Social Work  Discharge planning Services  CM Consult  Post Acute Care Choice:    Choice offered to:     DME Arranged:    DME Agency:     HH Arranged:    HH Agency:     Status of Service:  Completed, signed off  Medicare Important Message Given:  Yes-second notification given Date Medicare IM Given:    Medicare IM give by:    Date Additional Medicare IM Given:    Additional Medicare Important Message give by:     If discussed at Long Length of Stay Meetings, dates discussed:    Additional Comments:  Leone Haven, RN 06/18/2015, 10:36 AM

## 2015-06-18 NOTE — Progress Notes (Signed)
Physical Therapy Treatment Patient Details Name: David Mathews MRN: [id number] DOB: 1941/11/07 Today's Date: 06/18/2015    History of Present Illness Pt is a 74 y/o male with a PMH significant for B partial TKA in 2010, back surgery 05/20/15. Pt presents with R knee infection and underwent I&D (WBAT). Pt also noted to have a non-healed lumbar wound from previous surgery and pt was taken for I&D of this area as well.     PT Comments    Pt tolerated treatment well. Increase time for transfer as pt is very slow and talkative. Had difficult time following instructions given. Tries to do things his own way. Possible discharge to SNF later today. Continue with current POC.   Follow Up Recommendations  CIR;Supervision/Assistance - 24 hour     Equipment Recommendations       Recommendations for Other Services       Precautions / Restrictions Precautions Precautions: Knee;Fall;Back Precaution Comments: Pt still with back precautions from prior surgery. Has brace that was provided to him by MD, however does not wear this and has bought a lumbar corset from the drug store that he has been occasionally wearing instead. Required Braces or Orthoses: Knee Immobilizer - Right;Spinal Brace Knee Immobilizer - Right: Other (comment) Restrictions RLE Weight Bearing: Weight bearing as tolerated    Mobility  Bed Mobility Overal bed mobility: Needs Assistance Bed Mobility: Supine to Sit       Sit to supine: +2 for physical assistance;Mod assist   General bed mobility comments: HOB elevated. Pt needed help getting LEs off bed.   Transfers Overall transfer level: Needs assistance Equipment used: Rolling walker (2 wheeled) Transfers: Stand Pivot Transfers;Sit to/from Stand Sit to Stand: Mod assist;+2 physical assistance;From elevated surface Stand pivot transfers: Min assist;+2 physical assistance       General transfer comment: Mod A to power up. VC for sequencing and use of  RW  Ambulation/Gait Ambulation/Gait assistance: Mod assist Ambulation Distance (Feet): 2 Feet Assistive device: Rolling walker (2 wheeled) Gait Pattern/deviations: Step-to pattern     General Gait Details: Pivot steps only during SPT.   Stairs            Wheelchair Mobility    Modified Rankin (Stroke Patients Only)       Balance                                    Cognition Arousal/Alertness: Awake/alert Behavior During Therapy: WFL for tasks assessed/performed Overall Cognitive Status: Within Functional Limits for tasks assessed                      Exercises      General Comments        Pertinent Vitals/Pain Faces Pain Scale: Hurts a little bit Pain Descriptors / Indicators: Aching;Sore Pain Intervention(s): Monitored during session;Repositioned    Home Living                      Prior Function            PT Goals (current goals can now be found in the care plan section) Progress towards PT goals: Progressing toward goals    Frequency  Min 3X/week    PT Plan Current plan remains appropriate    Co-evaluation             End of Session Equipment Utilized During Treatment: Gait  belt Activity Tolerance: Patient tolerated treatment well Patient left: in chair;with call bell/phone within reach     Time: 1055-1119 PT Time Calculation (min) (ACUTE ONLY): 24 min  Charges:                       G CodesNita Sells, SPTA Jun 28, 2015, 11:38 AM

## 2015-06-18 NOTE — Progress Notes (Signed)
  Echocardiogram 2D Echocardiogram has been performed.  David Mathews 06/18/2015, 11:07 AM

## 2015-06-18 NOTE — Progress Notes (Signed)
Patient ID: ACEN CRAUN, male   DOB: 01/27/1941, 74 y.o.   MRN: [id number]     Subjective:  Patient reports pain as mild to moderate.  Patient would like a dressing change.  This was discussed and the wrap was removed form his foot and the wrap around the knee would remain  Objective:   VITALS:   Filed Vitals:   06/17/15 0613 06/17/15 1452 06/17/15 2214 06/18/15 0433  BP: 140/79 113/58 149/87 141/58  Pulse: 111 102 117 107  Temp: 99 F (37.2 C) 98.5 F (36.9 C) 98.9 F (37.2 C) 98 F (36.7 C)  TempSrc: Oral Oral Oral Oral  Resp: Height:      Weight:      SpO2: 94% 92% 98% 95%    ABD soft Sensation intact distally Dorsiflexion/Plantar flexion intact Incision: dressing C/D/I and no drainage Left ring finger looks better today symptoms remain unchanged  Lab Results  Component Value Date   WBC 12.3* 06/17/2015   HGB 9.7* 06/17/2015   HCT 28.6* 06/17/2015   MCV 87.7 06/17/2015   PLT 505* 06/17/2015   BMET    Component Value Date/Time   NA 130* 06/17/2015 0543   K 4.8 06/17/2015 0543   CL 96* 06/17/2015 0543   CO2 27 06/17/2015 0543   GLUCOSE 252* 06/17/2015 0543   BUN 9 06/17/2015 0543   CREATININE 0.88 06/17/2015 0543   CALCIUM 8.4* 06/17/2015 0543   GFRNONAA >60 06/17/2015 0543   GFRAA >60 06/17/2015 0543     Assessment/Plan: 3 Days Post-Op   Principal Problem:   Infected prosthetic knee joint, right unicompartmental knee  Active Problems:   Spinal stenosis, lumbar   Hypothyroidism   Uncontrolled type 2 diabetes mellitus   HTN (hypertension)   BPH (benign prostatic hyperplasia)   History of back surgery   Advance diet Up with therapy Continue plan per medicine WBAT Dry dressing PRN   DOUGLAS PARRY, BRANDON 06/18/2015, 10:14 AM  Discussed and agree with above.  Teryl Lucy, MD Cell [phone]

## 2015-06-18 NOTE — Progress Notes (Signed)
Patient dishcarging to [facility] by PTAR. Has incisions on back and R knee with dressing in place. Hemovac discontinued from back.

## 2015-06-18 NOTE — Progress Notes (Signed)
Patient ID: David Mathews, male   DOB: [birth year], 74 y.o.   MRN: [id number] Afeb, vss No new neuro issues Ready to increase activity. Hopefully going to SNF today. Will see back in office in about 1 week.

## 2015-06-18 NOTE — Clinical Social Work Placement (Signed)
   CLINICAL SOCIAL WORK PLACEMENT  NOTE  Date:  06/18/2015  Patient Details  Name: David Mathews MRN: [id number] Date of Birth: Sep 01, 1941  Clinical Social Work is seeking post-discharge placement for this patient at the Skilled  Nursing Facility level of care (*CSW will initial, date and re-position this form in  chart as items are completed):  Yes   Patient/family provided with [place] Clinical Social Work Department's list of facilities offering this level of care within the geographic area requested by the patient (or if unable, by the patient's family).  Yes   Patient/family informed of their freedom to choose among providers that offer the needed level of care, that participate in Medicare, Medicaid or managed care program needed by the patient, have an available bed and are willing to accept the patient.  Yes   Patient/family informed of [place]'s ownership interest in [facility] and [facility], as well as of the fact that they are under no obligation to receive care at these facilities.  PASRR submitted to EDS on 06/16/15     PASRR number received on 06/16/15     Existing PASRR number confirmed on       FL2 transmitted to all facilities in geographic area requested by pt/family on 06/16/15     FL2 transmitted to all facilities within larger geographic area on       Patient informed that his/her managed care company has contracts with or will negotiate with certain facilities, including the following:        Yes   Patient/family informed of bed offers received.  Patient chooses bed at [facility]     Physician recommends and patient chooses bed at      Patient to be transferred to [facility] on 06/18/15.  Patient to be transferred to facility by Ambulance     Patient family notified on 06/18/15 of transfer.  Name of family member notified:  Gaynelle Adu (son notified by patient)     PHYSICIAN Please prepare priority  discharge summary, including medications, Please sign FL2, Please prepare prescriptions     Additional Comment:  Patient's family unable to provide proof of 2 year residency in [place] so patient cannot admit to originally planned facility [facility]). Per MD patient ready for DC to [facility]. RN, patient, patient's family, and facility notified of DC. RN given number for report. DC packet on chart. Ambulance transport requested for patient. CSW signing off.   _______________________________________________ Roddie Mc MSW, [place], [place], [phone]

## 2015-06-19 LAB — BODY FLUID CULTURE

## 2015-06-21 LAB — ANAEROBIC CULTURE

## 2015-06-21 LAB — CULTURE, BLOOD (ROUTINE X 2)
Culture: NO GROWTH
Culture: NO GROWTH

## 2015-06-22 ENCOUNTER — Encounter (HOSPITAL_COMMUNITY): Payer: Self-pay | Admitting: Orthopedic Surgery

## 2015-06-24 DIAGNOSIS — E11649 Type 2 diabetes mellitus with hypoglycemia without coma: Secondary | ICD-10-CM | POA: Diagnosis not present

## 2015-06-24 DIAGNOSIS — I10 Essential (primary) hypertension: Secondary | ICD-10-CM | POA: Diagnosis not present

## 2015-06-28 DIAGNOSIS — T847XXD Infection and inflammatory reaction due to other internal orthopedic prosthetic devices, implants and grafts, subsequent encounter: Secondary | ICD-10-CM | POA: Diagnosis not present

## 2015-07-01 DIAGNOSIS — M4806 Spinal stenosis, lumbar region: Secondary | ICD-10-CM | POA: Diagnosis not present

## 2015-07-01 DIAGNOSIS — B9561 Methicillin susceptible Staphylococcus aureus infection as the cause of diseases classified elsewhere: Secondary | ICD-10-CM | POA: Diagnosis not present

## 2015-07-01 DIAGNOSIS — E1165 Type 2 diabetes mellitus with hyperglycemia: Secondary | ICD-10-CM | POA: Diagnosis not present

## 2015-07-01 DIAGNOSIS — T814XXD Infection following a procedure, subsequent encounter: Secondary | ICD-10-CM | POA: Diagnosis not present

## 2015-07-01 DIAGNOSIS — T8453XD Infection and inflammatory reaction due to internal right knee prosthesis, subsequent encounter: Secondary | ICD-10-CM | POA: Diagnosis not present

## 2015-07-01 DIAGNOSIS — L03115 Cellulitis of right lower limb: Secondary | ICD-10-CM | POA: Diagnosis not present

## 2015-07-02 DIAGNOSIS — T8453XD Infection and inflammatory reaction due to internal right knee prosthesis, subsequent encounter: Secondary | ICD-10-CM | POA: Diagnosis not present

## 2015-07-02 DIAGNOSIS — E1165 Type 2 diabetes mellitus with hyperglycemia: Secondary | ICD-10-CM | POA: Diagnosis not present

## 2015-07-02 DIAGNOSIS — T814XXD Infection following a procedure, subsequent encounter: Secondary | ICD-10-CM | POA: Diagnosis not present

## 2015-07-02 DIAGNOSIS — M4806 Spinal stenosis, lumbar region: Secondary | ICD-10-CM | POA: Diagnosis not present

## 2015-07-02 DIAGNOSIS — L03115 Cellulitis of right lower limb: Secondary | ICD-10-CM | POA: Diagnosis not present

## 2015-07-02 DIAGNOSIS — B9561 Methicillin susceptible Staphylococcus aureus infection as the cause of diseases classified elsewhere: Secondary | ICD-10-CM | POA: Diagnosis not present

## 2015-07-05 ENCOUNTER — Telehealth: Payer: Self-pay | Admitting: Internal Medicine

## 2015-07-05 DIAGNOSIS — E1165 Type 2 diabetes mellitus with hyperglycemia: Secondary | ICD-10-CM | POA: Diagnosis not present

## 2015-07-05 DIAGNOSIS — M4806 Spinal stenosis, lumbar region: Secondary | ICD-10-CM | POA: Diagnosis not present

## 2015-07-05 DIAGNOSIS — T814XXD Infection following a procedure, subsequent encounter: Secondary | ICD-10-CM | POA: Diagnosis not present

## 2015-07-05 DIAGNOSIS — B9561 Methicillin susceptible Staphylococcus aureus infection as the cause of diseases classified elsewhere: Secondary | ICD-10-CM | POA: Diagnosis not present

## 2015-07-05 DIAGNOSIS — T8453XD Infection and inflammatory reaction due to internal right knee prosthesis, subsequent encounter: Secondary | ICD-10-CM | POA: Diagnosis not present

## 2015-07-05 DIAGNOSIS — Z79899 Other long term (current) drug therapy: Secondary | ICD-10-CM | POA: Diagnosis not present

## 2015-07-05 DIAGNOSIS — L03115 Cellulitis of right lower limb: Secondary | ICD-10-CM | POA: Diagnosis not present

## 2015-07-05 NOTE — Telephone Encounter (Signed)
I received a call today from David Mathews advanced home care nurse. He is receiving IV cefazolin and oral rifampin at home for his MSSA right prosthetic knee infection. His nurse had noted a few red spots on his lower legs. He is not having any itching, fever or chills. Although this could be an early leukocytoclastic vasculitis related to his antibiotics (most likely cefazolin) I suggested continuing his antibiotics for now and simply observing to see if the rash progresses. He has a follow-up visit with his primary care provider tomorrow. He is scheduled to be seen here 07/19/2015.

## 2015-07-07 DIAGNOSIS — T814XXD Infection following a procedure, subsequent encounter: Secondary | ICD-10-CM | POA: Diagnosis not present

## 2015-07-07 DIAGNOSIS — B9561 Methicillin susceptible Staphylococcus aureus infection as the cause of diseases classified elsewhere: Secondary | ICD-10-CM | POA: Diagnosis not present

## 2015-07-07 DIAGNOSIS — M4806 Spinal stenosis, lumbar region: Secondary | ICD-10-CM | POA: Diagnosis not present

## 2015-07-07 DIAGNOSIS — L03115 Cellulitis of right lower limb: Secondary | ICD-10-CM | POA: Diagnosis not present

## 2015-07-07 DIAGNOSIS — E1165 Type 2 diabetes mellitus with hyperglycemia: Secondary | ICD-10-CM | POA: Diagnosis not present

## 2015-07-07 DIAGNOSIS — T8453XD Infection and inflammatory reaction due to internal right knee prosthesis, subsequent encounter: Secondary | ICD-10-CM | POA: Diagnosis not present

## 2015-07-09 DIAGNOSIS — E1165 Type 2 diabetes mellitus with hyperglycemia: Secondary | ICD-10-CM | POA: Diagnosis not present

## 2015-07-09 DIAGNOSIS — M4806 Spinal stenosis, lumbar region: Secondary | ICD-10-CM | POA: Diagnosis not present

## 2015-07-09 DIAGNOSIS — T814XXD Infection following a procedure, subsequent encounter: Secondary | ICD-10-CM | POA: Diagnosis not present

## 2015-07-09 DIAGNOSIS — T8453XD Infection and inflammatory reaction due to internal right knee prosthesis, subsequent encounter: Secondary | ICD-10-CM | POA: Diagnosis not present

## 2015-07-09 DIAGNOSIS — L03115 Cellulitis of right lower limb: Secondary | ICD-10-CM | POA: Diagnosis not present

## 2015-07-09 DIAGNOSIS — B9561 Methicillin susceptible Staphylococcus aureus infection as the cause of diseases classified elsewhere: Secondary | ICD-10-CM | POA: Diagnosis not present

## 2015-07-12 DIAGNOSIS — E1165 Type 2 diabetes mellitus with hyperglycemia: Secondary | ICD-10-CM | POA: Diagnosis not present

## 2015-07-12 DIAGNOSIS — T847XXD Infection and inflammatory reaction due to other internal orthopedic prosthetic devices, implants and grafts, subsequent encounter: Secondary | ICD-10-CM | POA: Diagnosis not present

## 2015-07-12 DIAGNOSIS — T814XXD Infection following a procedure, subsequent encounter: Secondary | ICD-10-CM | POA: Diagnosis not present

## 2015-07-12 DIAGNOSIS — M4806 Spinal stenosis, lumbar region: Secondary | ICD-10-CM | POA: Diagnosis not present

## 2015-07-12 DIAGNOSIS — T8453XD Infection and inflammatory reaction due to internal right knee prosthesis, subsequent encounter: Secondary | ICD-10-CM | POA: Diagnosis not present

## 2015-07-12 DIAGNOSIS — B9561 Methicillin susceptible Staphylococcus aureus infection as the cause of diseases classified elsewhere: Secondary | ICD-10-CM | POA: Diagnosis not present

## 2015-07-12 DIAGNOSIS — L03115 Cellulitis of right lower limb: Secondary | ICD-10-CM | POA: Diagnosis not present

## 2015-07-12 DIAGNOSIS — Z79899 Other long term (current) drug therapy: Secondary | ICD-10-CM | POA: Diagnosis not present

## 2015-07-14 DIAGNOSIS — T8453XD Infection and inflammatory reaction due to internal right knee prosthesis, subsequent encounter: Secondary | ICD-10-CM | POA: Diagnosis not present

## 2015-07-14 DIAGNOSIS — E1165 Type 2 diabetes mellitus with hyperglycemia: Secondary | ICD-10-CM | POA: Diagnosis not present

## 2015-07-14 DIAGNOSIS — T814XXD Infection following a procedure, subsequent encounter: Secondary | ICD-10-CM | POA: Diagnosis not present

## 2015-07-14 DIAGNOSIS — M4806 Spinal stenosis, lumbar region: Secondary | ICD-10-CM | POA: Diagnosis not present

## 2015-07-14 DIAGNOSIS — L03115 Cellulitis of right lower limb: Secondary | ICD-10-CM | POA: Diagnosis not present

## 2015-07-14 DIAGNOSIS — B9561 Methicillin susceptible Staphylococcus aureus infection as the cause of diseases classified elsewhere: Secondary | ICD-10-CM | POA: Diagnosis not present

## 2015-07-16 DIAGNOSIS — B9561 Methicillin susceptible Staphylococcus aureus infection as the cause of diseases classified elsewhere: Secondary | ICD-10-CM | POA: Diagnosis not present

## 2015-07-16 DIAGNOSIS — M4806 Spinal stenosis, lumbar region: Secondary | ICD-10-CM | POA: Diagnosis not present

## 2015-07-16 DIAGNOSIS — T8453XD Infection and inflammatory reaction due to internal right knee prosthesis, subsequent encounter: Secondary | ICD-10-CM | POA: Diagnosis not present

## 2015-07-16 DIAGNOSIS — E1165 Type 2 diabetes mellitus with hyperglycemia: Secondary | ICD-10-CM | POA: Diagnosis not present

## 2015-07-16 DIAGNOSIS — L03115 Cellulitis of right lower limb: Secondary | ICD-10-CM | POA: Diagnosis not present

## 2015-07-16 DIAGNOSIS — T814XXD Infection following a procedure, subsequent encounter: Secondary | ICD-10-CM | POA: Diagnosis not present

## 2015-07-19 ENCOUNTER — Ambulatory Visit (INDEPENDENT_AMBULATORY_CARE_PROVIDER_SITE_OTHER): Payer: Medicare Other | Admitting: Infectious Disease

## 2015-07-19 ENCOUNTER — Encounter: Payer: Self-pay | Admitting: Infectious Disease

## 2015-07-19 VITALS — BP 121/76 | HR 86 | Temp 97.8°F | Wt 177.0 lb

## 2015-07-19 DIAGNOSIS — M48061 Spinal stenosis, lumbar region without neurogenic claudication: Secondary | ICD-10-CM

## 2015-07-19 DIAGNOSIS — T8450XD Infection and inflammatory reaction due to unspecified internal joint prosthesis, subsequent encounter: Secondary | ICD-10-CM | POA: Diagnosis not present

## 2015-07-19 DIAGNOSIS — M4806 Spinal stenosis, lumbar region: Secondary | ICD-10-CM | POA: Diagnosis not present

## 2015-07-19 DIAGNOSIS — IMO0001 Reserved for inherently not codable concepts without codable children: Secondary | ICD-10-CM

## 2015-07-19 DIAGNOSIS — IMO0002 Reserved for concepts with insufficient information to code with codable children: Secondary | ICD-10-CM

## 2015-07-19 DIAGNOSIS — D649 Anemia, unspecified: Secondary | ICD-10-CM | POA: Diagnosis not present

## 2015-07-19 DIAGNOSIS — E1165 Type 2 diabetes mellitus with hyperglycemia: Secondary | ICD-10-CM

## 2015-07-19 DIAGNOSIS — L03115 Cellulitis of right lower limb: Secondary | ICD-10-CM | POA: Diagnosis not present

## 2015-07-19 DIAGNOSIS — T8149XA Infection following a procedure, other surgical site, initial encounter: Secondary | ICD-10-CM | POA: Insufficient documentation

## 2015-07-19 DIAGNOSIS — T8453XD Infection and inflammatory reaction due to internal right knee prosthesis, subsequent encounter: Secondary | ICD-10-CM | POA: Diagnosis not present

## 2015-07-19 DIAGNOSIS — T814XXD Infection following a procedure, subsequent encounter: Secondary | ICD-10-CM

## 2015-07-19 DIAGNOSIS — A4901 Methicillin susceptible Staphylococcus aureus infection, unspecified site: Secondary | ICD-10-CM

## 2015-07-19 DIAGNOSIS — B9561 Methicillin susceptible Staphylococcus aureus infection as the cause of diseases classified elsewhere: Secondary | ICD-10-CM | POA: Diagnosis not present

## 2015-07-19 DIAGNOSIS — T8459XD Infection and inflammatory reaction due to other internal joint prosthesis, subsequent encounter: Secondary | ICD-10-CM

## 2015-07-19 DIAGNOSIS — Z96659 Presence of unspecified artificial knee joint: Principal | ICD-10-CM

## 2015-07-19 DIAGNOSIS — Z79899 Other long term (current) drug therapy: Secondary | ICD-10-CM | POA: Diagnosis not present

## 2015-07-19 HISTORY — DX: Methicillin susceptible Staphylococcus aureus infection, unspecified site: A49.01

## 2015-07-19 HISTORY — DX: Infection following a procedure, other surgical site, initial encounter: T81.49XA

## 2015-07-19 LAB — C-REACTIVE PROTEIN: CRP: 7.8 mg/dL — ABNORMAL HIGH (ref <0.60)

## 2015-07-19 NOTE — Patient Instructions (Signed)
I am concerned about your knee swelling up since surgery and by your pain  We will make sure we have repeat labs from [facility] and or our own labs today  I need for your antibiotics to be extended thru September 12th and I need for you to see Dr. Luciana Axe or ID Clinic MD that day (same day as your appt with Dr. Gerlene Fee)

## 2015-07-19 NOTE — Progress Notes (Signed)
Subjective:    Patient ID: David Mathews, male    DOB: November 06, 1941, 74 y.o.   MRN: [id number]  HPI  74 year old with hx of bilateral partial knee replacement, and prior spinal surgery in [place].  On May 20, 2015 he underwent spinal fusion procedure. Two weeks later, he began feeling pain in his right knee which became progressively swollen. He attributed this due having hyperextending his knee but clearly surgery (or some other cutaneous portal) could have been a hematogenous portal for infection from surgical site to knee. He underwent aspiration of his PJ which was infected and then I and D of lumbar wound by Dr. Hal Neer and I and D and exchange revision of right knee by Dr. Mardelle Matte. He has grown MSSA from all cultures from knee, but I cannot find cultures from his lumbar wound, changed to IV cefazolin 2g IV q 8 and oral rifampin $RemoveBefor'300mg'AMlFcuJLFeiw$  bid. SInce surgery he has unfortunately experienced new swelling and effusion has worsened.  He saw Dr. Mardelle Matte on August 22nd and Dr. Mardelle Matte is concerned that patient may need a 2 staged revision and I am sharing this concern. Patients knee pain became abruptly worse today and is especially tender distal to the effusion.  He states he has minimal back pain. He is frustrated having to coordinate the q 8 hour nature of the IV cefazolin. I do not see any sensitive imaging modality of his back such as MRI but he has had I and D by Dr. Hal Neer.   Dr. Luanna Cole note:       Review of Systems  Constitutional: Negative for fever, chills, diaphoresis, activity change, appetite change, fatigue and unexpected weight change.  HENT: Negative for congestion, rhinorrhea, sinus pressure, sneezing, sore throat and trouble swallowing.   Eyes: Negative for photophobia and visual disturbance.  Respiratory: Negative for cough, chest tightness, shortness of breath, wheezing and stridor.   Cardiovascular: Negative for chest pain, palpitations and leg swelling.  Gastrointestinal:  Negative for nausea, vomiting, abdominal pain, diarrhea, constipation, blood in stool, abdominal distention and anal bleeding.  Genitourinary: Negative for dysuria, hematuria, flank pain and difficulty urinating.  Musculoskeletal: Positive for myalgias and joint swelling. Negative for back pain, arthralgias and gait problem.  Skin: Negative for color change, pallor, rash and wound.  Neurological: Negative for dizziness, tremors, weakness and light-headedness.  Hematological: Negative for adenopathy. Does not bruise/bleed easily.  Psychiatric/Behavioral: Negative for behavioral problems, confusion, sleep disturbance, dysphoric mood, decreased concentration and agitation.       Objective:   Physical Exam  Constitutional: He is oriented to person, place, and time. He appears well-developed and well-nourished.  HENT:  Head: Normocephalic and atraumatic.  Eyes: Conjunctivae and EOM are normal.  Neck: Normal range of motion. Neck supple.  Cardiovascular: Normal rate and regular rhythm.   Pulmonary/Chest: Effort normal. No respiratory distress. He has no wheezes.  Abdominal: Soft. He exhibits no distension.  Musculoskeletal: Normal range of motion. He exhibits no edema or tenderness.  Neurological: He is alert and oriented to person, place, and time.  Skin: Skin is warm and dry.  Psychiatric: He has a normal mood and affect. His speech is normal and behavior is normal. Thought content normal. Cognition and memory are normal.    PICC line with small amount of erythema around PICC site 07/19/15       Knee with effusion and warmth tenderness: 07/19/15      Lumbar surgical site:07/19/15         Assessment &  Plan:   Prosthetic knee infection with MSSA likely due to lumbar wound infection after Neurosurgery and hematogenous seeding of his prosthesis:  We will recheck ESR and CRP (not done with his routine labs)  I am VERY concerned by recurrence of his effusion. SA infected joints  are notoriously difficult to treat with hardware in place.  I would advocate a VERY low threshold for 2 stage surgery with removal of prosthetic material followed by repeat 6-8 weeks anti staph ceph (perhaps 2g ceftriaxone daily for ease of home administration) along with continued oral rifampin and then consideration of reimplantation of new TKA  Given that he also has hardware in spine and that we never had radiographic assessment of that site I would personally also prefer to extend total abx for 1 year postoperatively with periodic assessments by [place] in ID  Pt will see Dr. Mardelle Matte on the 9th and Dr Hal Neer on the 12th. Dr. Linus Salmons has agreed to see the patient on the same day as his appointment with Dr. Hal Neer though I expect that decision may have been made by then to go back to the OR. I will touch base with Dr. Mardelle Matte after labs back from today for complete picture  I spent greater than 60  minutes with the patient including greater than 50% of time in face to face counsel of the patient and his son and in coordination of their care with [facility], and orthopedic surgery.

## 2015-07-20 DIAGNOSIS — M009 Pyogenic arthritis, unspecified: Secondary | ICD-10-CM | POA: Diagnosis not present

## 2015-07-20 DIAGNOSIS — S61239A Puncture wound without foreign body of unspecified finger without damage to nail, initial encounter: Secondary | ICD-10-CM | POA: Diagnosis not present

## 2015-07-20 DIAGNOSIS — Z23 Encounter for immunization: Secondary | ICD-10-CM | POA: Diagnosis not present

## 2015-07-20 DIAGNOSIS — L89159 Pressure ulcer of sacral region, unspecified stage: Secondary | ICD-10-CM | POA: Diagnosis not present

## 2015-07-20 DIAGNOSIS — E1140 Type 2 diabetes mellitus with diabetic neuropathy, unspecified: Secondary | ICD-10-CM | POA: Diagnosis not present

## 2015-07-20 DIAGNOSIS — N401 Enlarged prostate with lower urinary tract symptoms: Secondary | ICD-10-CM | POA: Diagnosis not present

## 2015-07-20 DIAGNOSIS — Z9181 History of falling: Secondary | ICD-10-CM | POA: Diagnosis not present

## 2015-07-20 DIAGNOSIS — Z6825 Body mass index (BMI) 25.0-25.9, adult: Secondary | ICD-10-CM | POA: Diagnosis not present

## 2015-07-20 LAB — SEDIMENTATION RATE: Sed Rate: 41 mm/hr — ABNORMAL HIGH (ref 0–20)

## 2015-07-27 DIAGNOSIS — E1165 Type 2 diabetes mellitus with hyperglycemia: Secondary | ICD-10-CM | POA: Diagnosis not present

## 2015-07-27 DIAGNOSIS — T814XXD Infection following a procedure, subsequent encounter: Secondary | ICD-10-CM | POA: Diagnosis not present

## 2015-07-27 DIAGNOSIS — T8453XD Infection and inflammatory reaction due to internal right knee prosthesis, subsequent encounter: Secondary | ICD-10-CM | POA: Diagnosis not present

## 2015-07-27 DIAGNOSIS — T847XXD Infection and inflammatory reaction due to other internal orthopedic prosthetic devices, implants and grafts, subsequent encounter: Secondary | ICD-10-CM | POA: Diagnosis not present

## 2015-07-27 DIAGNOSIS — L03115 Cellulitis of right lower limb: Secondary | ICD-10-CM | POA: Diagnosis not present

## 2015-07-27 DIAGNOSIS — M4806 Spinal stenosis, lumbar region: Secondary | ICD-10-CM | POA: Diagnosis not present

## 2015-07-27 DIAGNOSIS — Z79899 Other long term (current) drug therapy: Secondary | ICD-10-CM | POA: Diagnosis not present

## 2015-07-27 DIAGNOSIS — B9561 Methicillin susceptible Staphylococcus aureus infection as the cause of diseases classified elsewhere: Secondary | ICD-10-CM | POA: Diagnosis not present

## 2015-07-30 DIAGNOSIS — M4806 Spinal stenosis, lumbar region: Secondary | ICD-10-CM | POA: Diagnosis not present

## 2015-07-30 DIAGNOSIS — T8453XD Infection and inflammatory reaction due to internal right knee prosthesis, subsequent encounter: Secondary | ICD-10-CM | POA: Diagnosis not present

## 2015-07-30 DIAGNOSIS — L03115 Cellulitis of right lower limb: Secondary | ICD-10-CM | POA: Diagnosis not present

## 2015-07-30 DIAGNOSIS — E1165 Type 2 diabetes mellitus with hyperglycemia: Secondary | ICD-10-CM | POA: Diagnosis not present

## 2015-07-30 DIAGNOSIS — B9561 Methicillin susceptible Staphylococcus aureus infection as the cause of diseases classified elsewhere: Secondary | ICD-10-CM | POA: Diagnosis not present

## 2015-07-30 DIAGNOSIS — T814XXD Infection following a procedure, subsequent encounter: Secondary | ICD-10-CM | POA: Diagnosis not present

## 2015-08-02 ENCOUNTER — Ambulatory Visit (INDEPENDENT_AMBULATORY_CARE_PROVIDER_SITE_OTHER): Payer: Medicare Other | Admitting: Internal Medicine

## 2015-08-02 ENCOUNTER — Encounter: Payer: Self-pay | Admitting: Internal Medicine

## 2015-08-02 VITALS — BP 109/68 | HR 84 | Temp 97.5°F | Ht 69.0 in | Wt 173.0 lb

## 2015-08-02 DIAGNOSIS — T8459XD Infection and inflammatory reaction due to other internal joint prosthesis, subsequent encounter: Secondary | ICD-10-CM

## 2015-08-02 DIAGNOSIS — T8450XD Infection and inflammatory reaction due to unspecified internal joint prosthesis, subsequent encounter: Secondary | ICD-10-CM | POA: Diagnosis present

## 2015-08-02 DIAGNOSIS — M4806 Spinal stenosis, lumbar region: Secondary | ICD-10-CM | POA: Diagnosis not present

## 2015-08-02 DIAGNOSIS — Z96659 Presence of unspecified artificial knee joint: Principal | ICD-10-CM

## 2015-08-02 MED ORDER — AMOXICILLIN 875 MG PO TABS: 875.0000 mg | ORAL_TABLET | Freq: Two times a day (BID) | ORAL | Status: DC

## 2015-08-02 NOTE — Progress Notes (Addendum)
   Subjective:    Patient ID: David Mathews, male    DOB: 07/07/1941, 74 y.o.   MRN: [id number]  HPI Here for follow up of PJI.  He had bilateral knee replacements in 4481, without complications. On May 20, 2015, he underwent spinal fusion procedure. Two weeks later, he began feeling pain in his right knee which became progressively swollen.  Due to that, he underwent irrigation and debridement with partial right knee and poly exchange in the OR and also debridement of spine on 7/26. Intra-op gram stain of the knee revealed GPCs in clusters and eventually grew MSSA. He was treated with Ancef and rifampin for now 6 weeks and has done well.  He did see my partner Dr. Tommy Medal 8/29 and had increased swelling and warmth and concerned with reinfection but has remained stable since.  His ESR and CRP have both declined and at this time his knee has been doing well.  He tells me Dr. Mardelle Matte also pleased with knee.  No fever, no chills.  Last CRP 56.1 mg/L from 07/30/2015.  ESR also noted and is 47 again.  No concerns today.    Review of Systems  Constitutional: Negative for fever and chills.  Gastrointestinal: Negative for nausea and diarrhea.  Musculoskeletal:       Improved swelling overall and some intermittent swelling  Skin: Negative for rash.  Neurological: Negative for dizziness and light-headedness.       Objective:   Physical Exam  Constitutional: He appears well-developed and well-nourished. No distress.  Eyes: No scleral icterus.  Cardiovascular: Normal rate, regular rhythm and normal heart sounds.   No murmur heard. Pulmonary/Chest: Effort normal and breath sounds normal. No respiratory distress.  Skin: No rash noted.          Assessment & Plan:

## 2015-08-02 NOTE — Assessment & Plan Note (Addendum)
Doing well with this and inflammatory markers have declined nicely.  Knee looks good.  All seems to be going well now and will transition him to oral continuation with Augmentin if he tolerates it and will try for 3-6 months.   RTC 2 months.  HH notified to remove picc line.  Prescriptions sent.

## 2015-08-03 DIAGNOSIS — T814XXD Infection following a procedure, subsequent encounter: Secondary | ICD-10-CM | POA: Diagnosis not present

## 2015-08-03 DIAGNOSIS — L03115 Cellulitis of right lower limb: Secondary | ICD-10-CM | POA: Diagnosis not present

## 2015-08-03 DIAGNOSIS — T8453XD Infection and inflammatory reaction due to internal right knee prosthesis, subsequent encounter: Secondary | ICD-10-CM | POA: Diagnosis not present

## 2015-08-03 DIAGNOSIS — M4806 Spinal stenosis, lumbar region: Secondary | ICD-10-CM | POA: Diagnosis not present

## 2015-08-03 DIAGNOSIS — E1165 Type 2 diabetes mellitus with hyperglycemia: Secondary | ICD-10-CM | POA: Diagnosis not present

## 2015-08-03 DIAGNOSIS — B9561 Methicillin susceptible Staphylococcus aureus infection as the cause of diseases classified elsewhere: Secondary | ICD-10-CM | POA: Diagnosis not present

## 2015-08-04 ENCOUNTER — Encounter: Payer: Self-pay | Admitting: *Deleted

## 2015-08-04 ENCOUNTER — Telehealth: Payer: Self-pay | Admitting: *Deleted

## 2015-08-04 NOTE — Telephone Encounter (Signed)
error 

## 2015-08-04 NOTE — Telephone Encounter (Signed)
Nurse returned call to [facility] to let her know that they received her verbal and pulled patient PICC 08/03/15.

## 2015-08-25 DIAGNOSIS — T847XXD Infection and inflammatory reaction due to other internal orthopedic prosthetic devices, implants and grafts, subsequent encounter: Secondary | ICD-10-CM | POA: Diagnosis not present

## 2015-08-30 ENCOUNTER — Telehealth: Payer: Self-pay | Admitting: *Deleted

## 2015-08-30 ENCOUNTER — Other Ambulatory Visit: Payer: Self-pay | Admitting: *Deleted

## 2015-08-30 DIAGNOSIS — T8459XS Infection and inflammatory reaction due to other internal joint prosthesis, sequela: Secondary | ICD-10-CM

## 2015-08-30 DIAGNOSIS — Z96659 Presence of unspecified artificial knee joint: Principal | ICD-10-CM

## 2015-08-30 MED ORDER — FLUCONAZOLE 200 MG PO TABS: 200.0000 mg | ORAL_TABLET | Freq: Every day | ORAL | Status: DC

## 2015-08-30 MED ORDER — CEFADROXIL 500 MG PO CAPS: 500.0000 mg | ORAL_CAPSULE | Freq: Two times a day (BID) | ORAL | Status: DC

## 2015-08-30 NOTE — Telephone Encounter (Signed)
Patient having rash/itching in groin since starting amoxicillin  twice daily.  This has gotten progressively worse, patient unable to tolerate it.  He is leaving for 2 weeks in [place] on Thursday, would like to know if he can 1) change dosage of amoxicillin, 2) change medications, or 3) do anything else about this rash - OTC measures have not helped.  He feels this may be an allergy or intolerance to the antibiotic. Please advise.  Andree Coss, RN

## 2015-08-30 NOTE — Addendum Note (Signed)
Addended by: Andree Coss on: 08/30/2015 12:18 PM   Modules accepted: Orders, Medications

## 2015-08-30 NOTE — Telephone Encounter (Signed)
Sent in order for cefadroxil  twice daily #60, R2 to patient's pharmacy per telephone order from Dr. Luciana Axe.  RN relayed instructions to patient.  He verbalized understanding. Regarding the rash, patient states he has found the most relief by using Laural Benes and Johnson's Baby wash to cleanse then keeping the area dry.  He states the orthopedic surgeon called this "yeast" and prescribed nystatin cream, but patient states it burns too much when he uses the cream.  He has been using hydrogen peroxide to try to dry out the rash.  RN advised that peroxide would slow the healing, advised him to continue the baby wash and add saline instead of peroxide to wash out the rash if needed, then try the nystatin again to see if the burning is decreased.  Patient verbalized agreement. Patient inquired if there was anything else to help heal the rash. Would oral diflucan be appropriate for this situation?

## 2015-08-30 NOTE — Telephone Encounter (Signed)
Order placed, patient notified. Thanks!

## 2015-08-30 NOTE — Telephone Encounter (Signed)
Yes, fluconazole 200 mg daily for 5 days. thanks

## 2015-09-02 ENCOUNTER — Encounter: Payer: Self-pay | Admitting: Internal Medicine

## 2015-09-02 ENCOUNTER — Encounter: Payer: Self-pay | Admitting: Infectious Disease

## 2015-09-27 DIAGNOSIS — M009 Pyogenic arthritis, unspecified: Secondary | ICD-10-CM | POA: Diagnosis not present

## 2015-09-27 DIAGNOSIS — E1140 Type 2 diabetes mellitus with diabetic neuropathy, unspecified: Secondary | ICD-10-CM | POA: Diagnosis not present

## 2015-09-27 DIAGNOSIS — N401 Enlarged prostate with lower urinary tract symptoms: Secondary | ICD-10-CM | POA: Diagnosis not present

## 2015-09-27 DIAGNOSIS — E039 Hypothyroidism, unspecified: Secondary | ICD-10-CM | POA: Diagnosis not present

## 2015-09-27 DIAGNOSIS — M179 Osteoarthritis of knee, unspecified: Secondary | ICD-10-CM | POA: Diagnosis not present

## 2015-09-27 DIAGNOSIS — Z6827 Body mass index (BMI) 27.0-27.9, adult: Secondary | ICD-10-CM | POA: Diagnosis not present

## 2015-09-29 DIAGNOSIS — T847XXD Infection and inflammatory reaction due to other internal orthopedic prosthetic devices, implants and grafts, subsequent encounter: Secondary | ICD-10-CM | POA: Diagnosis not present

## 2015-10-06 DIAGNOSIS — M4806 Spinal stenosis, lumbar region: Secondary | ICD-10-CM | POA: Diagnosis not present

## 2015-10-06 DIAGNOSIS — Z6827 Body mass index (BMI) 27.0-27.9, adult: Secondary | ICD-10-CM | POA: Diagnosis not present

## 2015-10-07 ENCOUNTER — Ambulatory Visit (INDEPENDENT_AMBULATORY_CARE_PROVIDER_SITE_OTHER): Payer: Medicare Other | Admitting: Internal Medicine

## 2015-10-07 ENCOUNTER — Encounter: Payer: Self-pay | Admitting: Internal Medicine

## 2015-10-07 VITALS — BP 131/82 | HR 70 | Temp 97.4°F | Wt 188.0 lb

## 2015-10-07 DIAGNOSIS — T814XXD Infection following a procedure, subsequent encounter: Secondary | ICD-10-CM | POA: Diagnosis not present

## 2015-10-07 DIAGNOSIS — T8450XD Infection and inflammatory reaction due to unspecified internal joint prosthesis, subsequent encounter: Secondary | ICD-10-CM

## 2015-10-07 DIAGNOSIS — T8459XD Infection and inflammatory reaction due to other internal joint prosthesis, subsequent encounter: Secondary | ICD-10-CM

## 2015-10-07 DIAGNOSIS — Z96659 Presence of unspecified artificial knee joint: Principal | ICD-10-CM

## 2015-10-07 DIAGNOSIS — T7840XA Allergy, unspecified, initial encounter: Secondary | ICD-10-CM

## 2015-10-07 DIAGNOSIS — IMO0001 Reserved for inherently not codable concepts without codable children: Secondary | ICD-10-CM

## 2015-10-07 LAB — COMPREHENSIVE METABOLIC PANEL
ALBUMIN: 3.8 g/dL (ref 3.6–5.1)
ALK PHOS: 110 U/L (ref 40–115)
ALT: 17 U/L (ref 9–46)
AST: 16 U/L (ref 10–35)
BUN: 15 mg/dL (ref 7–25)
CALCIUM: 9.5 mg/dL (ref 8.6–10.3)
CHLORIDE: 91 mmol/L — AB (ref 98–110)
CO2: 28 mmol/L (ref 20–31)
Creat: 0.79 mg/dL (ref 0.70–1.18)
Glucose, Bld: 226 mg/dL — ABNORMAL HIGH (ref 65–99)
POTASSIUM: 5 mmol/L (ref 3.5–5.3)
Sodium: 129 mmol/L — ABNORMAL LOW (ref 135–146)
TOTAL PROTEIN: 6.2 g/dL (ref 6.1–8.1)
Total Bilirubin: 0.3 mg/dL (ref 0.2–1.2)

## 2015-10-07 LAB — SEDIMENTATION RATE: Sed Rate: 1 mm/hr (ref 0–20)

## 2015-10-07 LAB — C-REACTIVE PROTEIN: CRP: 0.6 mg/dL — ABNORMAL HIGH (ref <0.60)

## 2015-10-07 NOTE — Assessment & Plan Note (Signed)
Now resolved with treatment provided.  Listed as allergy.  Doing fine on cephalosporin.

## 2015-10-07 NOTE — Progress Notes (Signed)
   Subjective:    Patient ID: David Mathews, male    DOB: 04-23-1941, 74 y.o.   MRN: [id number]  HPI Here for follow up of PJI.  He had bilateral knee replacements in 6122, without complications. On May 20, 2015, he underwent spinal fusion procedure. Two weeks later, he began feeling pain in his right knee which became progressively swollen.  Due to that, he underwent irrigation and debridement with partial right knee and poly exchange in the OR and also debridement of spine on 7/26. Intra-op gram stain of the knee revealed GPCs in clusters and eventually grew MSSA. He was treated with Ancef and rifampin for 6 weeks and did well.  ESR and CRP improved.     Started on Augmentin for continuation and developed a rash and yeast infection.  Yeast cleared with fluconazole and rash just cleared recently, lasted about 3 weeks.  No fever, no chills.  Note reviewed from Dr. Mardelle Matte from 11/9 visit and no new concerns.  Saw Dr. Hal Neer yesterday and was told no issues, no follow up needed unless things changed.    Review of Systems  Constitutional: Negative for fever and chills.  Gastrointestinal: Negative for nausea and diarrhea.  Musculoskeletal:       Improved swelling overall and some intermittent swelling  Skin: Negative for rash.  Neurological: Negative for dizziness and light-headedness.       Objective:   Physical Exam  Constitutional: He appears well-developed and well-nourished. No distress.  Eyes: No scleral icterus.  Cardiovascular: Normal rate, regular rhythm and normal heart sounds.   No murmur heard. Pulmonary/Chest: Effort normal and breath sounds normal. No respiratory distress. He has no wheezes.  Skin: No rash noted.  Vitals reviewed.  Social History   Social History  . Marital Status: Widowed    Spouse Name: N/A  . Number of Children: N/A  . Years of Education: N/A   Occupational History  . Not on file.   Social History Main Topics  . Smoking status: Former Smoker  -- 1.00 packs/day for 10 years    Types: Cigarettes    Quit date: 05/17/1999  . Smokeless tobacco: Not on file  . Alcohol Use: No  . Drug Use: No  . Sexual Activity: Not on file   Other Topics Concern  . Not on file   Social History Narrative         Assessment & Plan:

## 2015-10-07 NOTE — Assessment & Plan Note (Signed)
Doing well 

## 2015-10-07 NOTE — Assessment & Plan Note (Signed)
Doing well.  Will continue with duracef another 2-3 months.  Labs today including CRP, ESR.  RTC 2 months

## 2015-11-24 DIAGNOSIS — Z23 Encounter for immunization: Secondary | ICD-10-CM | POA: Diagnosis not present

## 2015-11-24 DIAGNOSIS — M009 Pyogenic arthritis, unspecified: Secondary | ICD-10-CM | POA: Diagnosis not present

## 2015-11-24 DIAGNOSIS — E1140 Type 2 diabetes mellitus with diabetic neuropathy, unspecified: Secondary | ICD-10-CM | POA: Diagnosis not present

## 2015-11-24 DIAGNOSIS — M179 Osteoarthritis of knee, unspecified: Secondary | ICD-10-CM | POA: Diagnosis not present

## 2015-11-24 DIAGNOSIS — Z021 Encounter for pre-employment examination: Secondary | ICD-10-CM | POA: Diagnosis not present

## 2015-11-24 DIAGNOSIS — N401 Enlarged prostate with lower urinary tract symptoms: Secondary | ICD-10-CM | POA: Diagnosis not present

## 2015-11-24 DIAGNOSIS — Z6829 Body mass index (BMI) 29.0-29.9, adult: Secondary | ICD-10-CM | POA: Diagnosis not present

## 2015-11-29 ENCOUNTER — Ambulatory Visit: Payer: Medicare Other | Admitting: Internal Medicine

## 2015-12-13 DIAGNOSIS — T847XXD Infection and inflammatory reaction due to other internal orthopedic prosthetic devices, implants and grafts, subsequent encounter: Secondary | ICD-10-CM | POA: Diagnosis not present

## 2016-01-04 DIAGNOSIS — T847XXD Infection and inflammatory reaction due to other internal orthopedic prosthetic devices, implants and grafts, subsequent encounter: Secondary | ICD-10-CM | POA: Diagnosis not present

## 2016-01-07 DIAGNOSIS — T8453XA Infection and inflammatory reaction due to internal right knee prosthesis, initial encounter: Secondary | ICD-10-CM | POA: Diagnosis not present

## 2016-01-07 DIAGNOSIS — M25569 Pain in unspecified knee: Secondary | ICD-10-CM | POA: Diagnosis not present

## 2016-01-07 DIAGNOSIS — Z471 Aftercare following joint replacement surgery: Secondary | ICD-10-CM | POA: Diagnosis not present

## 2016-01-07 DIAGNOSIS — M25562 Pain in left knee: Secondary | ICD-10-CM | POA: Diagnosis not present

## 2016-01-07 DIAGNOSIS — M25561 Pain in right knee: Secondary | ICD-10-CM | POA: Diagnosis not present

## 2016-01-07 DIAGNOSIS — Z96651 Presence of right artificial knee joint: Secondary | ICD-10-CM | POA: Diagnosis not present

## 2016-01-20 ENCOUNTER — Ambulatory Visit: Payer: Medicare Other | Admitting: Internal Medicine

## 2016-02-14 DIAGNOSIS — Z01818 Encounter for other preprocedural examination: Secondary | ICD-10-CM | POA: Diagnosis not present

## 2016-02-14 DIAGNOSIS — R609 Edema, unspecified: Secondary | ICD-10-CM | POA: Diagnosis not present

## 2016-02-14 DIAGNOSIS — E039 Hypothyroidism, unspecified: Secondary | ICD-10-CM | POA: Diagnosis not present

## 2016-02-14 DIAGNOSIS — T8453XD Infection and inflammatory reaction due to internal right knee prosthesis, subsequent encounter: Secondary | ICD-10-CM | POA: Diagnosis not present

## 2016-02-14 DIAGNOSIS — E1140 Type 2 diabetes mellitus with diabetic neuropathy, unspecified: Secondary | ICD-10-CM | POA: Diagnosis not present

## 2016-02-17 NOTE — H&P (Signed)
David Mathews is an 75 y.o. male.    Chief Complaint: Infected right unicompartmental knee arthroplasty   Procedure:    Resection of the right UKA and placment of antibiotic spacer  HPI: Pt is a 75 y.o. male complaining of right knee pain for 1+  years. Pain had continually increased since the beginning. X-rays in the clinic show previous UKA of the right knee.  Patient had his original UKA in 2010 with a subsequent washout of the knee in July 2016, by Dr. Lorretta Harp.  Pt has tried various conservative treatments which have failed to alleviate their symptoms. Various options are discussed with the patient. Risks, benefits and expectations were discussed with the patient. Patient understand the risks, benefits and expectations and wishes to proceed with surgery.    PCP: [name]  D/C Plans:      Home with HHRN  Post-op Meds:       No Rx given   Tranexamic Acid:      To be given - IV   Decadron:      Is not to be given  FYI:     ASA  Norco  ID Doctor - Dr. Luciana Axe   PMH: Past Medical History  Diagnosis Date  . Hypothyroidism   . Diabetes mellitus without complication (HCC)   . Arthritis   . Infected prosthetic knee joint, right unicompartmental knee  06/14/2015  . Postoperative wound infection 07/19/2015  . MSSA (methicillin susceptible Staphylococcus aureus) infection 07/19/2015    PSH: Past Surgical History  Procedure Laterality Date  . Back surgery  10/2010  . Partial knee arthroplasty Bilateral 09/2009  . Carpal tunnel release Left [id number]  . Carpal tunnel release Right 2009  . I&d extremity Right 06/15/2015    Procedure: IRRIGATION AND DEBRIDEMENT OF INFECTED PARTIAL RIGHT  KNEE AND POLY EXCHANGE, RIGHT KNEE SYNOVECTOMY;  Surgeon: Teryl Lucy, MD;  Location: [facility] OR;  Service: Orthopedics;  Laterality: Right;  . Lumbar wound debridement N/A 06/15/2015    Procedure: LUMBAR WOUND IRRIGATION AND DEBRIDEMENT;  Surgeon: Aliene Beams, MD;  Location: [facility] OR;   Service: Neurosurgery;  Laterality: N/A;    Social History:  reports that he quit smoking about 16 years ago. His smoking use included Cigarettes. He has a 10 pack-year smoking history. He does not have any smokeless tobacco history on file. He reports that he does not drink alcohol or use illicit drugs.  Allergies:  Allergies  Allergen Reactions  . Amoxicillin Rash    Yeast Infection Has patient had a PCN reaction causing immediate rash, facial/tongue/throat swelling, SOB or lightheadedness with hypotension: No Has patient had a PCN reaction causing severe rash involving mucus membranes or skin necrosis: No Has patient had a PCN reaction that required hospitalization No Has patient had a PCN reaction occurring within the last 10 years: Yes If all of the above answers are "NO", then may proceed with Cephalosporin use.    Medications: No current facility-administered medications for this encounter.   Current Outpatient Prescriptions  Medication Sig Dispense Refill  . acetaminophen (TYLENOL) 500 MG tablet Take 1,000 mg by mouth every 8 (eight) hours as needed for mild pain.     [name] aspirin EC 81 MG tablet Take 81 mg by mouth daily.    . cephALEXin (KEFLEX) 500 MG capsule Take 500 mg by mouth 2 (two) times daily.    . Cholecalciferol (VITAMIN D PO) Take 1 tablet by mouth daily.    . Cyanocobalamin (VITAMIN B 12 PO)  Take 1 tablet by mouth daily.    . diazepam (VALIUM) 5 MG tablet Take 1 tablet (5 mg total) by mouth every 6 (six) hours as needed for muscle spasms. 10 tablet 0  . diclofenac (VOLTAREN) 75 MG EC tablet Take 75 mg by mouth 2 (two) times daily.    . finasteride (PROSCAR) 5 MG tablet Take 5 mg by mouth daily.    . insulin aspart (NOVOLOG FLEXPEN) 100 UNIT/ML FlexPen Inject 10-15 Units into the skin 3 (three) times daily with meals. Sliding Scale    . Insulin Glargine (LANTUS SOLOSTAR) 100 UNIT/ML Solostar Pen Inject 55 Units into the skin daily. Per sliding scale (Patient taking  differently: Inject 50-55 Units into the skin daily. Per sliding scale) 15 mL 11  . levothyroxine (SYNTHROID, LEVOTHROID) 88 MCG tablet Take 88 mcg by mouth daily before breakfast.    . lisinopril-hydrochlorothiazide (PRINZIDE,ZESTORETIC) 20-25 MG per tablet Take 1 tablet by mouth every morning.     [name]. MAGNESIUM PO Take 1 tablet by mouth daily.    . Multiple Vitamins-Minerals (MULTIVITAMIN PO) Take 1 tablet by mouth daily.    [name]. oxyCODONE (OXY IR/ROXICODONE) 5 MG immediate release tablet Take 1 tablet (5 mg total) by mouth every 6 (six) hours as needed for breakthrough pain. 30 tablet 0  . Potassium 99 MG TABS Take 1 tablet by mouth 2 (two) times daily.    . pregabalin (LYRICA) 150 MG capsule Take 225 mg by mouth 2 (two) times daily.     . silodosin (RAPAFLO) 8 MG CAPS capsule Take 8 mg by mouth daily with breakfast.    . simvastatin (ZOCOR) 20 MG tablet Take 20 mg by mouth daily.    . cefadroxil (DURICEF) 500 MG capsule Take 1 capsule (500 mg total) by mouth 2 (two) times daily. (Patient not taking: Reported on 02/14/2016) 60 capsule 2  . traMADol (ULTRAM) 50 MG tablet Take 1 tablet (50 mg total) by mouth every 6 (six) hours as needed for moderate pain. (Patient not taking: Reported on 02/14/2016) 30 tablet 0       Review of Systems  Constitutional: Negative.   HENT: Negative.   Eyes: Negative.   Respiratory: Negative.   Cardiovascular: Negative.   Gastrointestinal: Negative.   Genitourinary: Negative.   Musculoskeletal: Positive for back pain and joint pain.  Skin: Negative.   Neurological: Negative.   Endo/Heme/Allergies: Negative.   Psychiatric/Behavioral: Negative.       Physical Exam  Constitutional: He is oriented to person, place, and time. He appears well-developed.  HENT:  Head: Normocephalic.  Eyes: Pupils are equal, round, and reactive to light.  Neck: Neck supple. No JVD present. No tracheal deviation present. No thyromegaly present.  Cardiovascular: Normal rate,  regular rhythm, normal heart sounds and intact distal pulses.   Respiratory: Effort normal and breath sounds normal. No stridor. No respiratory distress. He has no wheezes.  GI: Soft. There is no tenderness. There is no guarding.  Musculoskeletal:       Right knee: He exhibits decreased range of motion, swelling and bony tenderness. He exhibits no ecchymosis, no deformity, no laceration and no erythema. Tenderness found.  Lymphadenopathy:    He has no cervical adenopathy.  Neurological: He is alert and oriented to person, place, and time. A sensory deficit (neuropathy of bilateral LEs) is present.  Skin: Skin is warm and dry.  Psychiatric: He has a normal mood and affect.       Assessment/Plan Assessment:    Infected right  unicompartmental knee arthroplasty   Plan: Patient will undergo a resection of the right UKA and placment of antibiotic spacer on 02/28/2016 per Dr. Charlann Boxer at [facility]. Risks benefits and expectations were discussed with the patient. Patient understand risks, benefits and expectations and wishes to proceed.     David Auerbach    PA-C  02/17/2016, 8:52 AM

## 2016-02-18 ENCOUNTER — Encounter (HOSPITAL_COMMUNITY): Payer: Self-pay

## 2016-02-18 ENCOUNTER — Encounter (HOSPITAL_COMMUNITY)
Admission: RE | Admit: 2016-02-18 | Discharge: 2016-02-18 | Disposition: A | Discharge status: Home / Self Care | Payer: Medicare Other | Source: Ambulatory Visit | Attending: Orthopedic Surgery | Admitting: Orthopedic Surgery

## 2016-02-18 DIAGNOSIS — Z01812 Encounter for preprocedural laboratory examination: Secondary | ICD-10-CM | POA: Insufficient documentation

## 2016-02-18 DIAGNOSIS — Z96651 Presence of right artificial knee joint: Secondary | ICD-10-CM | POA: Insufficient documentation

## 2016-02-18 HISTORY — DX: Myoneural disorder, unspecified: G70.9

## 2016-02-18 LAB — APTT: aPTT: 32 seconds (ref 24–37)

## 2016-02-18 LAB — ABO/RH: ABO/RH(D): B POS

## 2016-02-18 LAB — CBC
HCT: 37.3 % — ABNORMAL LOW (ref 39.0–52.0)
HEMOGLOBIN: 13 g/dL (ref 13.0–17.0)
MCH: 30.5 pg (ref 26.0–34.0)
MCHC: 34.9 g/dL (ref 30.0–36.0)
MCV: 87.6 fL (ref 78.0–100.0)
PLATELETS: 254 10*3/uL (ref 150–400)
RBC: 4.26 MIL/uL (ref 4.22–5.81)
RDW: 13.8 % (ref 11.5–15.5)
WBC: 4.8 10*3/uL (ref 4.0–10.5)

## 2016-02-18 LAB — BASIC METABOLIC PANEL
ANION GAP: 8 (ref 5–15)
BUN: 14 mg/dL (ref 6–20)
CALCIUM: 9 mg/dL (ref 8.9–10.3)
CHLORIDE: 95 mmol/L — AB (ref 101–111)
CO2: 28 mmol/L (ref 22–32)
CREATININE: 0.97 mg/dL (ref 0.61–1.24)
GFR calc non Af Amer: >60 mL/min (ref >60)
Glucose, Bld: 270 mg/dL — ABNORMAL HIGH (ref 65–99)
Potassium: 5.5 mmol/L — ABNORMAL HIGH (ref 3.5–5.1)
SODIUM: 131 mmol/L — AB (ref 135–145)

## 2016-02-18 LAB — URINALYSIS, ROUTINE W REFLEX MICROSCOPIC
Bilirubin Urine: NEGATIVE
GLUCOSE, UA: 250 mg/dL — AB
Hgb urine dipstick: NEGATIVE
KETONES UR: NEGATIVE mg/dL
LEUKOCYTES UA: NEGATIVE
Nitrite: NEGATIVE
PH: 7 (ref 5.0–8.0)
Protein, ur: NEGATIVE mg/dL
SPECIFIC GRAVITY, URINE: 1.013 (ref 1.005–1.030)

## 2016-02-18 LAB — SURGICAL PCR SCREEN
MRSA, PCR: NEGATIVE
Staphylococcus aureus: NEGATIVE

## 2016-02-18 LAB — PROTIME-INR
INR: 0.86 (ref 0.00–1.49)
PROTHROMBIN TIME: 11.9 s (ref 11.6–15.2)

## 2016-02-18 NOTE — Progress Notes (Signed)
U/a and BMP results from 02/18/2016 faxed via EPIC to Dr Charlann Boxerlin.

## 2016-02-18 NOTE — Progress Notes (Signed)
[facility] L[facility] from [facility]14[facility].  They are to fax.

## 2016-02-18 NOTE — Progress Notes (Signed)
06/14/15- EKG- EPIC  06/18/15- ECHO-EPIC

## 2016-02-18 NOTE — Patient Instructions (Signed)
David FlorenceRussell F Saks  02/18/2016   Your procedure is scheduled on: 02/28/16   [facility]  Entrance take [place]  elevators to 3rd floor to  Short Stay Center at   0530 AM.  Call this number if you have problems the morning of surgery [phone]   Remember: ONLY 1 PERSON MAY GO WITH YOU TO SHORT STAY TO GET  READY MORNING OF YOUR SURGERY.  Do not eat food or drink liquids :After Midnight.  EAt a good healthy snack prior to bedtime.    Take these medicines the morning of surgery with A SIP OF WATER: Valium if needed, PRoscar, Synthroid, Oxycodone if needed, Lyrica, Rapaflo  DO NOT TAKE ANY DIABETIC MEDICATIONS DAY OF YOUR SURGERY                               You may not have any metal on your body including hair pins and              piercings  Do not wear jewelry, , lotions, powders or perfumes, deodorant                          Men may shave face and neck.   Do not bring valuables to the hospital. [facility] IS NOT             RESPONSIBLE   FOR VALUABLES.  Contacts, dentures or bridgework may not be worn into surgery.  Leave suitcase in the car. After surgery it may be brought to your room.       Special Instructions: coughing and deep breathing exercises, leg exercises               Please read over the following fact sheets you were given: _____________________________________________________________________             [facility] - Preparing for Surgery Before surgery, you can play an important role.  Because skin is not sterile, your skin needs to be as free of germs as possible.  You can reduce the number of germs on your skin by washing with CHG (chlorahexidine gluconate) soap before surgery.  CHG is an antiseptic cleaner which kills germs and bonds with the skin to continue killing germs even after washing. Please DO NOT use if you have an allergy to CHG or antibacterial soaps.  If your skin becomes reddened/irritated stop using the  CHG and inform your nurse when you arrive at Short Stay. Do not shave (including legs and underarms) for at least 48 hours prior to the first CHG shower.  You may shave your face/neck. Please follow these instructions carefully:  1.  Shower with CHG Soap the night before surgery and the  morning of Surgery.  2.  If you choose to wash your hair, wash your hair first as usual with your  normal  shampoo.  3.  After you shampoo, rinse your hair and body thoroughly to remove the  shampoo.                           4.  Use CHG as you would any other liquid soap.  You can apply chg directly  to the skin and wash  Gently with a scrungie or clean washcloth.  5.  Apply the CHG Soap to your body ONLY FROM THE NECK DOWN.   Do not use on face/ open                           Wound or open sores. Avoid contact with eyes, ears mouth and genitals (private parts).                       Wash face,  Genitals (private parts) with your normal soap.             6.  Wash thoroughly, paying special attention to the area where your surgery  will be performed.  7.  Thoroughly rinse your body with warm water from the neck down.  8.  DO NOT shower/wash with your normal soap after using and rinsing off  the CHG Soap.                9.  Pat yourself dry with a clean towel.            10.  Wear clean pajamas.            11.  Place clean sheets on your bed the night of your first shower and do not  sleep with pets. Day of Surgery : Do not apply any lotions/deodorants the morning of surgery.  Please wear clean clothes to the hospital/surgery center.  FAILURE TO FOLLOW THESE INSTRUCTIONS MAY RESULT IN THE CANCELLATION OF YOUR SURGERY PATIENT SIGNATURE_________________________________  NURSE SIGNATURE__________________________________  ________________________________________________________________________  WHAT IS A BLOOD TRANSFUSION? Blood Transfusion Information  A transfusion is the replacement of  blood or some of its parts. Blood is made up of multiple cells which provide different functions.  Red blood cells carry oxygen and are used for blood loss replacement.  White blood cells fight against infection.  Platelets control bleeding.  Plasma helps clot blood.  Other blood products are available for specialized needs, such as hemophilia or other clotting disorders. BEFORE THE TRANSFUSION  Who gives blood for transfusions?   Healthy volunteers who are fully evaluated to make sure their blood is safe. This is blood bank blood. Transfusion therapy is the safest it has ever been in the practice of medicine. Before blood is taken from a donor, a complete history is taken to make sure that person has no history of diseases nor engages in risky social behavior (examples are intravenous drug use or sexual activity with multiple partners). The donor's travel history is screened to minimize risk of transmitting infections, such as malaria. The donated blood is tested for signs of infectious diseases, such as HIV and hepatitis. The blood is then tested to be sure it is compatible with you in order to minimize the chance of a transfusion reaction. If you or a relative donates blood, this is often done in anticipation of surgery and is not appropriate for emergency situations. It takes many days to process the donated blood. RISKS AND COMPLICATIONS Although transfusion therapy is very safe and saves many lives, the main dangers of transfusion include:  1. Getting an infectious disease. 2. Developing a transfusion reaction. This is an allergic reaction to something in the blood you were given. Every precaution is taken to prevent this. The decision to have a blood transfusion has been considered carefully by your caregiver before blood is given. Blood is not given unless the benefits outweigh  the risks. AFTER THE TRANSFUSION  Right after receiving a blood transfusion, you will usually feel much  better and more energetic. This is especially true if your red blood cells have gotten low (anemic). The transfusion raises the level of the red blood cells which carry oxygen, and this usually causes an energy increase.  The nurse administering the transfusion will monitor you carefully for complications. HOME CARE INSTRUCTIONS  No special instructions are needed after a transfusion. You may find your energy is better. Speak with your caregiver about any limitations on activity for underlying diseases you may have. SEEK MEDICAL CARE IF:   Your condition is not improving after your transfusion.  You develop redness or irritation at the intravenous (IV) site. SEEK IMMEDIATE MEDICAL CARE IF:  Any of the following symptoms occur over the next 12 hours:  Shaking chills.  You have a temperature by mouth above 102 F (38.9 C), not controlled by medicine.  Chest, back, or muscle pain.  People around you feel you are not acting correctly or are confused.  Shortness of breath or difficulty breathing.  Dizziness and fainting.  You get a rash or develop hives.  You have a decrease in urine output.  Your urine turns a dark color or changes to pink, red, or brown. Any of the following symptoms occur over the next 10 days:  You have a temperature by mouth above 102 F (38.9 C), not controlled by medicine.  Shortness of breath.  Weakness after normal activity.  The white part of the eye turns yellow (jaundice).  You have a decrease in the amount of urine or are urinating less often.  Your urine turns a dark color or changes to pink, red, or brown. Document Released: 11/03/2000 Document Revised: 01/29/2012 Document Reviewed: 06/22/2008 ExitCare Patient Information 2014 [place].  _______________________________________________________________________  Incentive Spirometer  An incentive spirometer is a tool that can help keep your lungs clear and active. This tool measures  how well you are filling your lungs with each breath. Taking long deep breaths may help reverse or decrease the chance of developing breathing (pulmonary) problems (especially infection) following:  A long period of time when you are unable to move or be active. BEFORE THE PROCEDURE   If the spirometer includes an indicator to show your best effort, your nurse or respiratory therapist will set it to a desired goal.  If possible, sit up straight or lean slightly forward. Try not to slouch.  Hold the incentive spirometer in an upright position. INSTRUCTIONS FOR USE  3. Sit on the edge of your bed if possible, or sit up as far as you can in bed or on a chair. 4. Hold the incentive spirometer in an upright position. 5. Breathe out normally. 6. Place the mouthpiece in your mouth and seal your lips tightly around it. 7. Breathe in slowly and as deeply as possible, raising the piston or the ball toward the top of the column. 8. Hold your breath for 3-5 seconds or for as long as possible. Allow the piston or ball to fall to the bottom of the column. 9. Remove the mouthpiece from your mouth and breathe out normally. 10. Rest for a few seconds and repeat Steps 1 through 7 at least 10 times every 1-2 hours when you are awake. Take your time and take a few normal breaths between deep breaths. 11. The spirometer may include an indicator to show your best effort. Use the indicator as a goal to work  toward during each repetition. 12. After each set of 10 deep breaths, practice coughing to be sure your lungs are clear. If you have an incision (the cut made at the time of surgery), support your incision when coughing by placing a pillow or rolled up towels firmly against it. Once you are able to get out of bed, walk around indoors and cough well. You may stop using the incentive spirometer when instructed by your caregiver.  RISKS AND COMPLICATIONS  Take your time so you do not get dizzy or  light-headed.  If you are in pain, you may need to take or ask for pain medication before doing incentive spirometry. It is harder to take a deep breath if you are having pain. AFTER USE  Rest and breathe slowly and easily.  It can be helpful to keep track of a log of your progress. Your caregiver can provide you with a simple table to help with this. If you are using the spirometer at home, follow these instructions: [place] IF:   You are having difficultly using the spirometer.  You have trouble using the spirometer as often as instructed.  Your pain medication is not giving enough relief while using the spirometer.  You develop fever of 100.5 F (38.1 C) or higher. SEEK IMMEDIATE MEDICAL CARE IF:   You cough up bloody sputum that had not been present before.  You develop fever of 102 F (38.9 C) or greater.  You develop worsening pain at or near the incision site. MAKE SURE YOU:   Understand these instructions.  Will watch your condition.  Will get help right away if you are not doing well or get worse. Document Released: 03/19/2007 Document Revised: 01/29/2012 Document Reviewed: 05/20/2007 [facility] Patient Information 2014 [place], [place].   ________________________________________________________________________

## 2016-02-19 LAB — HEMOGLOBIN A1C
HEMOGLOBIN A1C: 10.3 % — AB (ref 4.8–5.6)
MEAN PLASMA GLUCOSE: 249 mg/dL

## 2016-02-21 NOTE — Progress Notes (Signed)
HGA1C done 02/18/16 faxed via EPIC to Dr Charlann Boxerlin .

## 2016-02-27 NOTE — Anesthesia Preprocedure Evaluation (Signed)
Anesthesia Evaluation  Patient identified by MRN, date of birth, ID band Patient awake    Reviewed: Allergy & Precautions, H&P , Patient's Chart, lab work & pertinent test results, reviewed documented beta blocker date and time   Airway Mallampati: II  TM Distance: >3 FB Neck ROM: full    Dental no notable dental hx.    Pulmonary former smoker,    Pulmonary exam normal breath sounds clear to auscultation       Cardiovascular  Rhythm:regular Rate:Normal     Neuro/Psych    GI/Hepatic   Endo/Other  diabetes  Renal/GU      Musculoskeletal   Abdominal   Peds  Hematology   Anesthesia Other Findings   Reproductive/Obstetrics                             Anesthesia Physical Anesthesia Plan  ASA: III  Anesthesia Plan: Spinal   Post-op Pain Management:    Induction:   Airway Management Planned:   Additional Equipment:   Intra-op Plan:   Post-operative Plan:   Informed Consent: I have reviewed the patients History and Physical, chart, labs and discussed the procedure including the risks, benefits and alternatives for the proposed anesthesia with the patient or authorized representative who has indicated his/her understanding and acceptance.   Dental Advisory Given  Plan Discussed with: CRNA  Anesthesia Plan Comments: (Lab work confirmed with CRNA in room. Platelets okay. Discussed spinal anesthetic, and patient consents to the procedure:  included risk of possible headache,backache, failed block, allergic reaction, and nerve injury. This patient was asked if she had any questions or concerns before the procedure started. )        Anesthesia Quick Evaluation

## 2016-02-28 ENCOUNTER — Inpatient Hospital Stay (HOSPITAL_COMMUNITY): Payer: Medicare Other | Admitting: Anesthesiology

## 2016-02-28 ENCOUNTER — Inpatient Hospital Stay (HOSPITAL_COMMUNITY)
Admission: RE | Admit: 2016-02-28 | Discharge: 2016-03-02 | DRG: 465 | Disposition: A | Discharge status: Home Health | Payer: Medicare Other | Source: Ambulatory Visit | Attending: Orthopedic Surgery | Admitting: Orthopedic Surgery

## 2016-02-28 ENCOUNTER — Encounter (HOSPITAL_COMMUNITY)
Admission: RE | Disposition: A | Discharge status: Home Health | Payer: Self-pay | Source: Ambulatory Visit | Attending: Orthopedic Surgery

## 2016-02-28 ENCOUNTER — Encounter (HOSPITAL_COMMUNITY): Payer: Self-pay | Admitting: *Deleted

## 2016-02-28 DIAGNOSIS — E663 Overweight: Secondary | ICD-10-CM | POA: Diagnosis present

## 2016-02-28 DIAGNOSIS — Y831 Surgical operation with implant of artificial internal device as the cause of abnormal reaction of the patient, or of later complication, without mention of misadventure at the time of the procedure: Secondary | ICD-10-CM | POA: Diagnosis present

## 2016-02-28 DIAGNOSIS — Z794 Long term (current) use of insulin: Secondary | ICD-10-CM | POA: Diagnosis not present

## 2016-02-28 DIAGNOSIS — T8453XD Infection and inflammatory reaction due to internal right knee prosthesis, subsequent encounter: Secondary | ICD-10-CM | POA: Diagnosis not present

## 2016-02-28 DIAGNOSIS — Z881 Allergy status to other antibiotic agents status: Secondary | ICD-10-CM | POA: Diagnosis not present

## 2016-02-28 DIAGNOSIS — Z79899 Other long term (current) drug therapy: Secondary | ICD-10-CM

## 2016-02-28 DIAGNOSIS — I10 Essential (primary) hypertension: Secondary | ICD-10-CM | POA: Diagnosis not present

## 2016-02-28 DIAGNOSIS — E039 Hypothyroidism, unspecified: Secondary | ICD-10-CM | POA: Diagnosis present

## 2016-02-28 DIAGNOSIS — B9689 Other specified bacterial agents as the cause of diseases classified elsewhere: Secondary | ICD-10-CM | POA: Diagnosis not present

## 2016-02-28 DIAGNOSIS — T8453XA Infection and inflammatory reaction due to internal right knee prosthesis, initial encounter: Secondary | ICD-10-CM | POA: Diagnosis not present

## 2016-02-28 DIAGNOSIS — Z452 Encounter for adjustment and management of vascular access device: Secondary | ICD-10-CM | POA: Diagnosis not present

## 2016-02-28 DIAGNOSIS — E1165 Type 2 diabetes mellitus with hyperglycemia: Secondary | ICD-10-CM | POA: Diagnosis not present

## 2016-02-28 DIAGNOSIS — Z79891 Long term (current) use of opiate analgesic: Secondary | ICD-10-CM

## 2016-02-28 DIAGNOSIS — Y838 Other surgical procedures as the cause of abnormal reaction of the patient, or of later complication, without mention of misadventure at the time of the procedure: Secondary | ICD-10-CM | POA: Diagnosis not present

## 2016-02-28 DIAGNOSIS — R2689 Other abnormalities of gait and mobility: Secondary | ICD-10-CM | POA: Diagnosis not present

## 2016-02-28 DIAGNOSIS — Z87891 Personal history of nicotine dependence: Secondary | ICD-10-CM

## 2016-02-28 DIAGNOSIS — M00861 Arthritis due to other bacteria, right knee: Secondary | ICD-10-CM | POA: Diagnosis not present

## 2016-02-28 DIAGNOSIS — E119 Type 2 diabetes mellitus without complications: Secondary | ICD-10-CM | POA: Diagnosis present

## 2016-02-28 DIAGNOSIS — M25561 Pain in right knee: Secondary | ICD-10-CM | POA: Diagnosis not present

## 2016-02-28 DIAGNOSIS — Z96653 Presence of artificial knee joint, bilateral: Secondary | ICD-10-CM | POA: Diagnosis present

## 2016-02-28 DIAGNOSIS — Z89529 Acquired absence of unspecified knee: Secondary | ICD-10-CM

## 2016-02-28 DIAGNOSIS — Z96651 Presence of right artificial knee joint: Secondary | ICD-10-CM | POA: Diagnosis not present

## 2016-02-28 DIAGNOSIS — Z7982 Long term (current) use of aspirin: Secondary | ICD-10-CM

## 2016-02-28 DIAGNOSIS — Z6828 Body mass index (BMI) 28.0-28.9, adult: Secondary | ICD-10-CM | POA: Diagnosis not present

## 2016-02-28 HISTORY — PX: PARTIAL KNEE ARTHROPLASTY: SHX2174

## 2016-02-28 LAB — TYPE AND SCREEN
ABO/RH(D): B POS
ANTIBODY SCREEN: NEGATIVE

## 2016-02-28 LAB — SYNOVIAL CELL COUNT + DIFF, W/ CRYSTALS
Crystals, Fluid: NONE SEEN
EOSINOPHILS-SYNOVIAL: 1 % (ref 0–1)
LYMPHOCYTES-SYNOVIAL FLD: 22 % — AB (ref 0–20)
MONOCYTE-MACROPHAGE-SYNOVIAL FLUID: 26 % — AB (ref 50–90)
NEUTROPHIL, SYNOVIAL: 51 % — AB (ref 0–25)
WBC, SYNOVIAL: 36 /mm3 (ref 0–200)

## 2016-02-28 LAB — GLUCOSE, CAPILLARY
GLUCOSE-CAPILLARY: 281 mg/dL — AB (ref 65–99)
Glucose-Capillary: 91 mg/dL (ref 65–99)
Glucose-Capillary: 174 mg/dL — ABNORMAL HIGH (ref 65–99)
Glucose-Capillary: 277 mg/dL — ABNORMAL HIGH (ref 65–99)

## 2016-02-28 SURGERY — ARTHROPLASTY, KNEE, UNICOMPARTMENTAL
Anesthesia: Spinal | Site: Knee | Laterality: Right

## 2016-02-28 MED ORDER — PROPOFOL 10 MG/ML IV BOLUS
INTRAVENOUS | Status: DC | PRN
  Administered 2016-02-28: 180 mg via INTRAVENOUS

## 2016-02-28 MED ORDER — DIPHENHYDRAMINE HCL 25 MG PO CAPS
25.0000 mg | ORAL_CAPSULE | Freq: Four times a day (QID) | ORAL | Status: DC | PRN
  Administered 2016-02-29: 25 mg via ORAL
  Filled 2016-02-28: qty 1

## 2016-02-28 MED ORDER — FINASTERIDE 5 MG PO TABS
5.0000 mg | ORAL_TABLET | Freq: Every day | ORAL | Status: DC
  Administered 2016-02-28 – 2016-03-02 (×4): 5 mg via ORAL
  Filled 2016-02-28 (×4): qty 1

## 2016-02-28 MED ORDER — ONDANSETRON HCL 4 MG/2ML IJ SOLN
4.0000 mg | Freq: Four times a day (QID) | INTRAMUSCULAR | Status: DC | PRN
  Administered 2016-03-01 (×2): 4 mg via INTRAVENOUS
  Filled 2016-02-28 (×2): qty 2

## 2016-02-28 MED ORDER — ASPIRIN EC 325 MG PO TBEC
325.0000 mg | DELAYED_RELEASE_TABLET | Freq: Two times a day (BID) | ORAL | Status: DC
  Administered 2016-02-29 – 2016-03-02 (×5): 325 mg via ORAL
  Filled 2016-02-28 (×7): qty 1

## 2016-02-28 MED ORDER — LEVOTHYROXINE SODIUM 88 MCG PO TABS
88.0000 ug | ORAL_TABLET | Freq: Every day | ORAL | Status: DC
  Administered 2016-02-29 – 2016-03-02 (×3): 88 ug via ORAL
  Filled 2016-02-28 (×5): qty 1

## 2016-02-28 MED ORDER — VANCOMYCIN HCL 1000 MG IV SOLR
INTRAVENOUS | Status: AC
  Filled 2016-02-28: qty 1000

## 2016-02-28 MED ORDER — METOCLOPRAMIDE HCL 10 MG PO TABS: 5.0000 mg | ORAL_TABLET | Freq: Three times a day (TID) | ORAL | Status: DC | PRN

## 2016-02-28 MED ORDER — SODIUM CHLORIDE 0.9 % IV SOLN
INTRAVENOUS | Status: DC
  Administered 2016-02-28 – 2016-03-01 (×4): via INTRAVENOUS
  Filled 2016-02-28 (×11): qty 1000

## 2016-02-28 MED ORDER — HYDROMORPHONE HCL 1 MG/ML IJ SOLN: 0.2500 mg | INTRAMUSCULAR | Status: DC | PRN

## 2016-02-28 MED ORDER — CHLORHEXIDINE GLUCONATE 4 % EX LIQD: 60.0000 mL | Freq: Once | CUTANEOUS | Status: DC

## 2016-02-28 MED ORDER — ACETAMINOPHEN 160 MG/5ML PO SOLN
650.0000 mg | Freq: Once | ORAL | Status: AC
  Administered 2016-02-28: 650 mg via ORAL
  Filled 2016-02-28: qty 20.3

## 2016-02-28 MED ORDER — SODIUM CHLORIDE 0.9 % IR SOLN
Status: DC | PRN
  Administered 2016-02-28 (×2): 3000 mL

## 2016-02-28 MED ORDER — PREGABALIN 75 MG PO CAPS
225.0000 mg | ORAL_CAPSULE | Freq: Two times a day (BID) | ORAL | Status: DC
  Administered 2016-02-28 – 2016-03-02 (×6): 225 mg via ORAL
  Filled 2016-02-28 (×6): qty 3

## 2016-02-28 MED ORDER — VANCOMYCIN HCL IN DEXTROSE 1-5 GM/200ML-% IV SOLN
INTRAVENOUS | Status: AC
  Filled 2016-02-28: qty 200

## 2016-02-28 MED ORDER — ONDANSETRON HCL 4 MG/2ML IJ SOLN
INTRAMUSCULAR | Status: DC | PRN
  Administered 2016-02-28 (×4): 2 mg via INTRAVENOUS

## 2016-02-28 MED ORDER — METHOCARBAMOL 500 MG PO TABS
500.0000 mg | ORAL_TABLET | Freq: Four times a day (QID) | ORAL | Status: DC | PRN
  Administered 2016-02-28 – 2016-02-29 (×3): 500 mg via ORAL
  Filled 2016-02-28 (×3): qty 1

## 2016-02-28 MED ORDER — ONDANSETRON HCL 4 MG PO TABS: 4.0000 mg | ORAL_TABLET | Freq: Four times a day (QID) | ORAL | Status: DC | PRN

## 2016-02-28 MED ORDER — SUGAMMADEX SODIUM 200 MG/2ML IV SOLN
INTRAVENOUS | Status: DC | PRN
  Administered 2016-02-28: 150 mg via INTRAVENOUS

## 2016-02-28 MED ORDER — EPHEDRINE SULFATE 50 MG/ML IJ SOLN
INTRAMUSCULAR | Status: DC | PRN
  Administered 2016-02-28: 10 mg via INTRAVENOUS
  Administered 2016-02-28: 5 mg via INTRAVENOUS

## 2016-02-28 MED ORDER — LACTATED RINGERS IV SOLN
INTRAVENOUS | Status: DC | PRN
  Administered 2016-02-28 (×3): via INTRAVENOUS

## 2016-02-28 MED ORDER — FENTANYL CITRATE (PF) 100 MCG/2ML IJ SOLN
INTRAMUSCULAR | Status: AC
  Filled 2016-02-28: qty 2

## 2016-02-28 MED ORDER — KETOROLAC TROMETHAMINE 30 MG/ML IJ SOLN
INTRAMUSCULAR | Status: AC
  Filled 2016-02-28: qty 1

## 2016-02-28 MED ORDER — HYDROMORPHONE HCL 1 MG/ML IJ SOLN: 0.5000 mg | INTRAMUSCULAR | Status: DC | PRN

## 2016-02-28 MED ORDER — OXYCODONE HCL 5 MG PO TABS
5.0000 mg | ORAL_TABLET | ORAL | Status: DC
  Administered 2016-02-28: 10 mg via ORAL
  Administered 2016-02-28: 5 mg via ORAL
  Administered 2016-02-28 – 2016-03-01 (×8): 10 mg via ORAL
  Administered 2016-03-01: 5 mg via ORAL
  Administered 2016-03-01 (×2): 10 mg via ORAL
  Administered 2016-03-01: 5 mg via ORAL
  Administered 2016-03-02 (×4): 10 mg via ORAL
  Filled 2016-02-28 (×2): qty 2
  Filled 2016-02-28: qty 3
  Filled 2016-02-28 (×2): qty 2
  Filled 2016-02-28: qty 3
  Filled 2016-02-28 (×3): qty 2
  Filled 2016-02-28: qty 1
  Filled 2016-02-28 (×2): qty 2
  Filled 2016-02-28: qty 1
  Filled 2016-02-28: qty 2
  Filled 2016-02-28: qty 3
  Filled 2016-02-28: qty 2
  Filled 2016-02-28: qty 1
  Filled 2016-02-28: qty 2

## 2016-02-28 MED ORDER — MAGNESIUM CITRATE PO SOLN: 1.0000 | Freq: Once | ORAL | Status: DC | PRN

## 2016-02-28 MED ORDER — VANCOMYCIN HCL IN DEXTROSE 1-5 GM/200ML-% IV SOLN
1000.0000 mg | Freq: Two times a day (BID) | INTRAVENOUS | Status: DC
  Administered 2016-02-28 – 2016-03-01 (×4): 1000 mg via INTRAVENOUS
  Filled 2016-02-28 (×5): qty 200

## 2016-02-28 MED ORDER — ROCURONIUM BROMIDE 100 MG/10ML IV SOLN
INTRAVENOUS | Status: AC
  Filled 2016-02-28: qty 1

## 2016-02-28 MED ORDER — SODIUM CHLORIDE 0.9 % IJ SOLN
INTRAMUSCULAR | Status: DC | PRN
  Administered 2016-02-28: 50 mL

## 2016-02-28 MED ORDER — EPHEDRINE SULFATE 50 MG/ML IJ SOLN
INTRAMUSCULAR | Status: AC
  Filled 2016-02-28: qty 1

## 2016-02-28 MED ORDER — SODIUM CHLORIDE 0.9 % IJ SOLN
INTRAMUSCULAR | Status: AC
  Filled 2016-02-28: qty 50

## 2016-02-28 MED ORDER — DOCUSATE SODIUM 100 MG PO CAPS
100.0000 mg | ORAL_CAPSULE | Freq: Two times a day (BID) | ORAL | Status: DC
  Administered 2016-02-28 – 2016-03-02 (×7): 100 mg via ORAL

## 2016-02-28 MED ORDER — SIMVASTATIN 20 MG PO TABS
20.0000 mg | ORAL_TABLET | Freq: Every day | ORAL | Status: DC
  Administered 2016-02-28 – 2016-03-02 (×4): 20 mg via ORAL
  Filled 2016-02-28 (×4): qty 1

## 2016-02-28 MED ORDER — BUPIVACAINE-EPINEPHRINE 0.25% -1:200000 IJ SOLN
INTRAMUSCULAR | Status: DC | PRN
  Administered 2016-02-28: 30 mL

## 2016-02-28 MED ORDER — POLYETHYLENE GLYCOL 3350 17 G PO PACK
17.0000 g | PACK | Freq: Two times a day (BID) | ORAL | Status: DC
  Administered 2016-02-29 – 2016-03-02 (×5): 17 g via ORAL

## 2016-02-28 MED ORDER — MIDAZOLAM HCL 5 MG/5ML IJ SOLN
INTRAMUSCULAR | Status: DC | PRN
  Administered 2016-02-28: 2 mg via INTRAVENOUS

## 2016-02-28 MED ORDER — INSULIN GLARGINE 100 UNIT/ML ~~LOC~~ SOLN
55.0000 [IU] | Freq: Every day | SUBCUTANEOUS | Status: DC
  Administered 2016-02-29 – 2016-03-02 (×3): 55 [IU] via SUBCUTANEOUS
  Filled 2016-02-28 (×3): qty 0.55

## 2016-02-28 MED ORDER — TAMSULOSIN HCL 0.4 MG PO CAPS
0.4000 mg | ORAL_CAPSULE | Freq: Every day | ORAL | Status: DC
  Administered 2016-02-28 – 2016-03-02 (×4): 0.4 mg via ORAL
  Filled 2016-02-28 (×5): qty 1

## 2016-02-28 MED ORDER — KETOROLAC TROMETHAMINE 30 MG/ML IJ SOLN
INTRAMUSCULAR | Status: DC | PRN
  Administered 2016-02-28: 30 mg

## 2016-02-28 MED ORDER — ACETAMINOPHEN 650 MG RE SUPP: 650.0000 mg | Freq: Four times a day (QID) | RECTAL | Status: DC | PRN

## 2016-02-28 MED ORDER — TOBRAMYCIN SULFATE 1.2 G IJ SOLR
INTRAMUSCULAR | Status: AC
  Filled 2016-02-28: qty 1.2

## 2016-02-28 MED ORDER — HYDROMORPHONE HCL 2 MG/ML IJ SOLN
INTRAMUSCULAR | Status: AC
  Filled 2016-02-28: qty 1

## 2016-02-28 MED ORDER — CEFAZOLIN SODIUM-DEXTROSE 2-3 GM-% IV SOLR
INTRAVENOUS | Status: DC | PRN
  Administered 2016-02-28: 2 g via INTRAVENOUS

## 2016-02-28 MED ORDER — SUGAMMADEX SODIUM 200 MG/2ML IV SOLN
INTRAVENOUS | Status: AC
  Filled 2016-02-28: qty 2

## 2016-02-28 MED ORDER — METOCLOPRAMIDE HCL 5 MG/ML IJ SOLN: 5.0000 mg | Freq: Three times a day (TID) | INTRAMUSCULAR | Status: DC | PRN

## 2016-02-28 MED ORDER — PHENOL 1.4 % MT LIQD: 1.0000 | OROMUCOSAL | Status: DC | PRN

## 2016-02-28 MED ORDER — CELECOXIB 200 MG PO CAPS
200.0000 mg | ORAL_CAPSULE | Freq: Two times a day (BID) | ORAL | Status: DC
  Administered 2016-02-28 – 2016-03-02 (×6): 200 mg via ORAL
  Filled 2016-02-28 (×8): qty 1

## 2016-02-28 MED ORDER — PROPOFOL 10 MG/ML IV BOLUS
INTRAVENOUS | Status: AC
  Filled 2016-02-28: qty 40

## 2016-02-28 MED ORDER — MENTHOL 3 MG MT LOZG
1.0000 | LOZENGE | OROMUCOSAL | Status: DC | PRN
  Filled 2016-02-28 (×2): qty 9

## 2016-02-28 MED ORDER — SODIUM CHLORIDE 0.9 % IJ SOLN
INTRAMUSCULAR | Status: AC
  Filled 2016-02-28: qty 10

## 2016-02-28 MED ORDER — TRANEXAMIC ACID 1000 MG/10ML IV SOLN
1000.0000 mg | Freq: Once | INTRAVENOUS | Status: AC
  Administered 2016-02-28: 1000 mg via INTRAVENOUS
  Filled 2016-02-28: qty 10

## 2016-02-28 MED ORDER — HYDROMORPHONE HCL 1 MG/ML IJ SOLN
INTRAMUSCULAR | Status: DC | PRN
  Administered 2016-02-28: 0.5 mg via INTRAVENOUS
  Administered 2016-02-28: .2 mg via INTRAVENOUS
  Administered 2016-02-28 (×2): .4 mg via INTRAVENOUS
  Administered 2016-02-28: 0.5 mg via INTRAVENOUS

## 2016-02-28 MED ORDER — FERROUS SULFATE 325 (65 FE) MG PO TABS
325.0000 mg | ORAL_TABLET | Freq: Three times a day (TID) | ORAL | Status: DC
Start: 2016-02-28 — End: 2016-03-02
  Administered 2016-02-29 – 2016-03-02 (×8): 325 mg via ORAL
  Filled 2016-02-28 (×11): qty 1

## 2016-02-28 MED ORDER — DEXAMETHASONE SODIUM PHOSPHATE 10 MG/ML IJ SOLN
10.0000 mg | Freq: Once | INTRAMUSCULAR | Status: AC
  Administered 2016-02-29: 10 mg via INTRAVENOUS
  Filled 2016-02-28: qty 1

## 2016-02-28 MED ORDER — BISACODYL 10 MG RE SUPP: 10.0000 mg | Freq: Every day | RECTAL | Status: DC | PRN

## 2016-02-28 MED ORDER — DEXAMETHASONE SODIUM PHOSPHATE 10 MG/ML IJ SOLN
INTRAMUSCULAR | Status: AC
  Filled 2016-02-28: qty 1

## 2016-02-28 MED ORDER — DEXAMETHASONE SODIUM PHOSPHATE 10 MG/ML IJ SOLN
INTRAMUSCULAR | Status: DC | PRN
  Administered 2016-02-28: 5 mg via INTRAVENOUS

## 2016-02-28 MED ORDER — INSULIN ASPART 100 UNIT/ML ~~LOC~~ SOLN
0.0000 [IU] | Freq: Three times a day (TID) | SUBCUTANEOUS | Status: DC
  Administered 2016-02-28: 8 [IU] via SUBCUTANEOUS
  Administered 2016-02-29: 5 [IU] via SUBCUTANEOUS
  Administered 2016-02-29: 11 [IU] via SUBCUTANEOUS
  Administered 2016-02-29 – 2016-03-01 (×2): 5 [IU] via SUBCUTANEOUS
  Administered 2016-03-01: 3 [IU] via SUBCUTANEOUS
  Administered 2016-03-01: 5 [IU] via SUBCUTANEOUS
  Administered 2016-03-02: 2 [IU] via SUBCUTANEOUS

## 2016-02-28 MED ORDER — LIDOCAINE HCL (CARDIAC) 20 MG/ML IV SOLN
INTRAVENOUS | Status: DC | PRN
  Administered 2016-02-28: 80 mg via INTRAVENOUS

## 2016-02-28 MED ORDER — ROCURONIUM BROMIDE 100 MG/10ML IV SOLN
INTRAVENOUS | Status: DC | PRN
  Administered 2016-02-28: 60 mg via INTRAVENOUS

## 2016-02-28 MED ORDER — VANCOMYCIN HCL 1000 MG IV SOLR
1000.0000 mg | INTRAVENOUS | Status: DC | PRN
  Administered 2016-02-28: 1000 mg via INTRAVENOUS

## 2016-02-28 MED ORDER — LIDOCAINE HCL (CARDIAC) 20 MG/ML IV SOLN
INTRAVENOUS | Status: AC
  Filled 2016-02-28: qty 5

## 2016-02-28 MED ORDER — TOBRAMYCIN SULFATE 1.2 G IJ SOLR
INTRAMUSCULAR | Status: AC
  Filled 2016-02-28: qty 2.4

## 2016-02-28 MED ORDER — VANCOMYCIN HCL 1000 MG IV SOLR
INTRAVENOUS | Status: AC
  Filled 2016-02-28: qty 2000

## 2016-02-28 MED ORDER — MIDAZOLAM HCL 2 MG/2ML IJ SOLN
INTRAMUSCULAR | Status: AC
  Filled 2016-02-28: qty 2

## 2016-02-28 MED ORDER — ONDANSETRON HCL 4 MG/2ML IJ SOLN
INTRAMUSCULAR | Status: AC
  Filled 2016-02-28: qty 4

## 2016-02-28 MED ORDER — ACETAMINOPHEN 325 MG PO TABS: 650.0000 mg | ORAL_TABLET | Freq: Four times a day (QID) | ORAL | Status: DC | PRN

## 2016-02-28 MED ORDER — FENTANYL CITRATE (PF) 100 MCG/2ML IJ SOLN
INTRAMUSCULAR | Status: DC | PRN
  Administered 2016-02-28: 100 ug via INTRAVENOUS

## 2016-02-28 MED ORDER — CEFAZOLIN SODIUM-DEXTROSE 2-4 GM/100ML-% IV SOLN
INTRAVENOUS | Status: AC
  Filled 2016-02-28: qty 100

## 2016-02-28 MED ORDER — BUPIVACAINE-EPINEPHRINE (PF) 0.25% -1:200000 IJ SOLN
INTRAMUSCULAR | Status: AC
  Filled 2016-02-28: qty 30

## 2016-02-28 MED ORDER — INSULIN ASPART 100 UNIT/ML ~~LOC~~ SOLN
0.0000 [IU] | Freq: Every day | SUBCUTANEOUS | Status: DC
  Administered 2016-02-28: 3 [IU] via SUBCUTANEOUS
  Administered 2016-02-29: 2 [IU] via SUBCUTANEOUS

## 2016-02-28 MED ORDER — ALUM & MAG HYDROXIDE-SIMETH 200-200-20 MG/5ML PO SUSP
30.0000 mL | ORAL | Status: DC | PRN
  Administered 2016-02-28 (×2): 30 mL via ORAL
  Filled 2016-02-28 (×2): qty 30

## 2016-02-28 MED ORDER — 0.9 % SODIUM CHLORIDE (POUR BTL) OPTIME
TOPICAL | Status: DC | PRN
  Administered 2016-02-28: 1000 mL

## 2016-02-28 MED ORDER — DEXTROSE 5 % IV SOLN
500.0000 mg | Freq: Four times a day (QID) | INTRAVENOUS | Status: DC | PRN
  Filled 2016-02-28: qty 5

## 2016-02-28 SURGICAL SUPPLY — 42 items
BAG DECANTER FOR FLEXI CONT (MISCELLANEOUS) IMPLANT
BAG ZIPLOCK 12X15 (MISCELLANEOUS) IMPLANT
BANDAGE ACE 6X5 VEL STRL LF (GAUZE/BANDAGES/DRESSINGS) ×2 IMPLANT
BLADE SAW RECIPROCATING 77.5 (BLADE) ×2 IMPLANT
BLADE SAW SGTL 13.0X1.19X90.0M (BLADE) ×2 IMPLANT
BOWL SMART MIX CTS (DISPOSABLE) ×2 IMPLANT
CEMENT HV SMART SET (Cement) ×6 IMPLANT
CLOTH BEACON ORANGE TIMEOUT ST (SAFETY) ×2 IMPLANT
CUFF TOURN SGL QUICK 34 (TOURNIQUET CUFF) ×1
CUFF TRNQT CYL 34X4X40X1 (TOURNIQUET CUFF) ×1 IMPLANT
DRAPE U-SHAPE 47X51 STRL (DRAPES) ×2 IMPLANT
DRSG AQUACEL AG ADV 3.5X10 (GAUZE/BANDAGES/DRESSINGS) ×2 IMPLANT
DURAPREP 26ML APPLICATOR (WOUND CARE) ×4 IMPLANT
ELECT REM PT RETURN 9FT ADLT (ELECTROSURGICAL) ×2
ELECTRODE REM PT RTRN 9FT ADLT (ELECTROSURGICAL) ×1 IMPLANT
GLOVE BIOGEL M 7.0 STRL (GLOVE) IMPLANT
GLOVE BIOGEL PI IND STRL 7.5 (GLOVE) ×4 IMPLANT
GLOVE BIOGEL PI IND STRL 8.5 (GLOVE) ×1 IMPLANT
GLOVE BIOGEL PI INDICATOR 7.5 (GLOVE) ×4
GLOVE BIOGEL PI INDICATOR 8.5 (GLOVE) ×1
GLOVE ECLIPSE 8.0 STRL XLNG CF (GLOVE) ×2 IMPLANT
GLOVE ORTHO TXT STRL SZ7.5 (GLOVE) ×4 IMPLANT
GOWN STRL REUS W/TWL LRG LVL3 (GOWN DISPOSABLE) ×2 IMPLANT
GOWN STRL REUS W/TWL XL LVL3 (GOWN DISPOSABLE) ×2 IMPLANT
LEGGING LITHOTOMY PAIR STRL (DRAPES) ×2 IMPLANT
LIQUID BAND (GAUZE/BANDAGES/DRESSINGS) ×2 IMPLANT
MANIFOLD NEPTUNE II (INSTRUMENTS) ×2 IMPLANT
MOLD SPACER FEM KNEE 53A/PX75M (Spacer) ×1 IMPLANT
PACK TOTAL KNEE CUSTOM (KITS) ×2 IMPLANT
SPACER KASM MOLD44APX70ML KNEE (Spacer) ×2 IMPLANT
SPACERMOLD FEM KNEE 53A/PX75M (Spacer) ×2 IMPLANT
SUT MNCRL AB 3-0 PS2 18 (SUTURE) ×2 IMPLANT
SUT MNCRL AB 4-0 PS2 18 (SUTURE) IMPLANT
SUT PDS AB 1 CT1 27 (SUTURE) ×6 IMPLANT
SUT VIC AB 1 CT1 36 (SUTURE) ×2 IMPLANT
SUT VIC AB 2-0 CT1 27 (SUTURE) ×2
SUT VIC AB 2-0 CT1 TAPERPNT 27 (SUTURE) ×2 IMPLANT
SUT VLOC 180 0 24IN GS25 (SUTURE) ×2 IMPLANT
SYR 50ML LL SCALE MARK (SYRINGE) ×2 IMPLANT
TRAY FOLEY W/METER SILVER 14FR (SET/KITS/TRAYS/PACK) IMPLANT
TRAY FOLEY W/METER SILVER 16FR (SET/KITS/TRAYS/PACK) ×2 IMPLANT
WRAP KNEE MAXI GEL POST OP (GAUZE/BANDAGES/DRESSINGS) ×2 IMPLANT

## 2016-02-28 NOTE — Anesthesia Postprocedure Evaluation (Signed)
Anesthesia Post Note  Patient: David Mathews  Procedure(s) Performed: Procedure(s) (LRB): RESECTION OF RIGHT UNI KNEE WITH PLACMENT WITH ANTIBIOTIC SPACERS (Right)  Patient location during evaluation: PACU Anesthesia Type: General Level of consciousness: sedated Pain management: satisfactory to patient Vital Signs Assessment: post-procedure vital signs reviewed and stable Respiratory status: spontaneous breathing Cardiovascular status: stable Anesthetic complications: no    Last Vitals:  Filed Vitals:   02/28/16 1240 02/28/16 1340  BP: 119/79 120/70  Pulse: 71 72  Temp: 36.8 C 36.7 C  Resp: 16 16    Last Pain:  Filed Vitals:   02/28/16 1538  PainSc: 0-No pain                 , EDWARD

## 2016-02-28 NOTE — Op Note (Signed)
David Mathews, David Mathews            ACCOUNT NO.:  [id number]  MEDICAL RECORD NO.:  [id number]  LOCATION:  1616                         FACILITY:  [facility]  PHYSICIAN:  Madlyn Frankel. Charlann Boxer, M.D.  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  PROCEDURE:  Resection of an infected right partial knee arthroplasty with placement of an antibiotic-laden articulating spacer from [employer].  SURGEON:  Madlyn Frankel. Charlann Boxer, M.D.  ASSISTANT:  Lanney Gins, PA-C.  ANESTHESIA:  General.  SPECIMENS:  Upon entry into the knee joint, synovial fluid was aspirated using a 10 mL syringe and sent for cell count, Gram stain and culture.  FINDINGS:  The patient's overall knee appearance appeared to be chronically infected without obvious purulence in the knee.  He had had significant progressive degenerative change of the lateral side of his knee as well.  Other surgical findings; please note that bone cuts were made for size 4 femur.  The gap was 17.5 mm in extension.  At the time of our revision surgery, we will prior use Sigma knee system with a 29 cemented sleeve and at least 5-mm augments.  DRAINS:  None.  TOURNIQUET TIME:  60 minutes at 250 mmHg.  COMPLICATION:  None.  BLOOD LOSS:  About 100 mL.  INDICATIONS FOR THE PROCEDURE:  Mr. Arp is a 75 year old male, who was presented to the office on referral evaluation for painful right partial knee arthroplasty.  His workup and history included infection. At this point, with an elevated sedimentation rate and C-reactive protein, surgical resection of the joint with planned reimplantation in a two-stage fashion was reviewed and discussed.  Risks, benefits and necessity of the procedure were discussed  and reviewed.  Consent was obtained for management of the infection.  PROCEDURE IN DETAIL:  The patient was brought to the operative theater. Once adequate anesthesia, preoperative antibiotics held until the time of incision and subsequently 2 g of Ancef and then 1 g of vancomycin administered.  He was positioned supine with the right thigh tourniquet placed.  The right lower extremity was then prepped and draped in sterile fashion.  A time-out was performed identifying the patient, planned procedure and extremity.  We used the occiput of the [facility] leg holder.  The leg was exsanguinated, tourniquet elevated to 250 mmHg. His old incision was demarcated.  I tried to eliminate some of the curve based incision by cutting out the old scar.  Soft tissue planes created. I then made a median arthrotomy.  At this point, we encountered not an obvious purulent knee joint, but an inflammatory/bloody effusion.  Cultures were taken from this as a 10 mL aspirate from the knee.  Soft tissue exposure was now obtained medial, lateral and suprapatellar synovectomy was carried out.  The overall appearance of the knee appeared to be chronically infected.  Following the initial exposure and synovectomy, attention was first directed to patella.  Precut measurement was 24 mm.  I resected down to about 15 mm.  I did  not make lug hole drills, we already visit this at the time of reimplantation.  At this point, the knee was flexed, retractors were placed.  First, attention was directed to the femur.  The femoral canal was opened with a drill and then irrigated to prevent fat emboli.  An intramedullary rod was passed at 3 degrees of valgus.  I resected 10 mm of bone off the distal femur making this initial cut, it did undermine the distal aspect of the medial partial knee implant.  I then used osteotomes and elevated off the remaining portion of the component without difficulty.  Following this, I sized  the femur to be a size 4.  We then subluxated the tibia anteriorly using an extramedullary guide, I set this just beneath the tibial tray medially, this amounted to a 2-mm cut on the lateral aspect of the proximal tibia, consistent with progressive degenerative changes laterally.  Following this resection, I did remove the tibial tray, we moved the little bit more bone medially to remove the remaining cement.  Following further complete meniscectomies and scar debridement of the posterior aspect of the knee, I then attended back to the femur.  The size 4 rotation block was then pinned into position based off the proximal tibial cut, which was confirmed had been perpendicular using the C-clamp.  The size 4, 4-in-1 cutting block was then pinned into place.  Anterior, posterior and chamfer cuts were made.  At this point, we measured the gap and found that it can be 17.5 mm, this amounted to about a 12.5 to 15-mm insert to allow for some cementing.  We then opened a large cemented femoral component based on the size in anterior and posterior dimension and the size 4 tibial tray for this antibiotic articulating spacer system.  At this point, I irrigated out the knee with normal saline solution. While on the back table, two batches of cement, one mixed with 2 g of vancomycin and 2.4 of tobramycin.  Once this was done, the cement was placed in the femoral mold and then the tibial mold was made.  The femoral mold was then held onto the distal end of the femur until the cement fully cured.  Once the cement had nearly completely cured, the third batch of cement with vancomycin and tobramycin were mixed and then the tibial insert was then placed onto the proximal end of the tibia. The knee was brought to extension while the cement had cured.  Excessive cement was removed.  We irrigated the knee with the remaining 500 mL of normal saline solution.  At this point, the knee was brought to about  45 degrees of flexion.  The extensor mechanism was reapproximated using #1 PDS sutures.  The remainder of the wound was closed with 2-0 Vicryl and running 3-0 Monocryl.  The knee was then cleaned, dried and dressed sterilely using surgical glue and Aquacel dressing.  The patient was then brought to the recovery room in stable condition tolerating the procedure well. Findings were reviewed with the son.  POSTOPERATIVE PLAN:  The patient will be admitted to the hospital.  He will be initially placed on vancomycin per pharmacy with a PICC line ordered.  We will have Infectious Disease to see the patient.  He will be allowed to be partial weightbearing on this right lower extremity with motion, not stress, but permitted.  In the hospital, we will evaluate A1c level as well as sedimentation rate and C-reactive protein for baseline at the  time of this procedure.  We will need to monitor his A1c as his preoperative A1c level was noted to be 10.  His best levels are in the upper 7-8.  This will be taking consideration at the time of reimplantation.  He has had a lot of stressors including infection as well as his wife passed away a year ago as well as his work that could decrease the ability for his surgical management.     Madlyn Frankel Charlann Boxer, M.D.     MDO/MEDQ  D:  02/28/2016  T:  02/28/2016  Job:  [id number]

## 2016-02-28 NOTE — Evaluation (Signed)
Physical Therapy Evaluation Patient Details Name: David Mathews MRN: [id number] DOB: [birth year] Today's Date: 02/28/2016   History of Present Illness  s/p resection right uni-knee with abx spacer  Clinical Impression  Pt admitted with above diagnosis. Pt currently with functional limitations due to the deficits listed below (see PT Problem List). Pt will benefit from skilled PT to increase their independence and safety with mobility to allow discharge to the venue listed below.       Follow Up Recommendations Home health PT    Equipment Recommendations  None recommended by PT    Recommendations for Other Services       Precautions / Restrictions Precautions Precautions: Knee Restrictions Weight Bearing Restrictions: Yes RLE Weight Bearing: Partial weight bearing RLE Partial Weight Bearing Percentage or Pounds: 50 %      Mobility  Bed Mobility Overal bed mobility: Needs Assistance Bed Mobility: Supine to Sit     Supine to sit: Supervision     General bed mobility comments: HOB elevated, incr time  Transfers Overall transfer level: Needs assistance Equipment used: Rolling walker (2 wheeled) Transfers: Sit to/from Stand Sit to Stand: Min guard         General transfer comment: cues for hand placement and overall safety  Ambulation/Gait Ambulation/Gait assistance: Min guard Ambulation Distance (Feet): 60 Feet Assistive device: Rolling walker (2 wheeled) Gait Pattern/deviations: Step-to pattern;Decreased step length - right;Decreased step length - left     General Gait Details: cues for sequence, RW position, PWB  Stairs            Wheelchair Mobility    Modified Rankin (Stroke Patients Only)       Balance                                             Pertinent Vitals/Pain Pain Assessment: 0-10 Pain Score: 1  Pain Location: right knee Pain Descriptors / Indicators: Sore Pain Intervention(s): Limited activity within  patient's tolerance;Monitored during session;Repositioned    Home Living Family/patient expects to be discharged to:: Private residence Living Arrangements: Children Available Help at Discharge: Family;Available PRN/intermittently   Home Access: Stairs to enter Entrance Stairs-Rails: Right;Left;Can reach both Entrance Stairs-Number of Steps: short steps and then a ramp Home Layout: One level Home Equipment: Walker - 2 wheels;Shower seat - built in;Crutches;Cane - single point      Prior Function Level of Independence: Independent         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        Extremity/Trunk Assessment   Upper Extremity Assessment: Defer to OT evaluation           Lower Extremity Assessment: RLE deficits/detail RLE Deficits / Details: ankle WFL; able to lift LE against gravity without difficulty       Communication      Cognition Arousal/Alertness: Awake/alert Behavior During Therapy: WFL for tasks assessed/performed Overall Cognitive Status: Within Functional Limits for tasks assessed                      General Comments      Exercises Total Joint Exercises Ankle Circles/Pumps: AROM;Both;10 reps Quad Sets: 5 reps;Both;AROM      Assessment/Plan    PT Assessment Patient needs continued PT services  PT Diagnosis Difficulty walking   PT Problem List Decreased strength;Decreased activity tolerance;Decreased balance;Decreased mobility;Decreased knowledge  of use of DME  PT Treatment Interventions Functional mobility training;Gait training;DME instruction;Stair training;Therapeutic activities;Therapeutic exercise;Patient/family education   PT Goals (Current goals can be found in the Care Plan section) Acute Rehab PT Goals Patient Stated Goal: home, back to I lifestyle/work PT Goal Formulation: With patient Time For Goal Achievement: 03/06/16 Potential to Achieve Goals: Good    Frequency 7X/week   Barriers to discharge         Co-evaluation               End of Session Equipment Utilized During Treatment: Gait belt Activity Tolerance: Patient tolerated treatment well;No increased pain Patient left: in chair;with call bell/phone within reach;with chair alarm set;with family/visitor present           Time: [id number] PT Time Calculation (min) (ACUTE ONLY): 35 min   Charges:   PT Evaluation $PT Eval Low Complexity: 1 Procedure PT Treatments $Gait Training: 8-22 mins   PT G Codes:        , 02/28/2016, 3:01 PM

## 2016-02-28 NOTE — Progress Notes (Signed)
Pharmacy Antibiotic Follow-up Note  David Mathews is a 75 y.o. year-old male admitted on 02/28/2016.  The patient is currently on day 1 of Vancomycin  for infected R knee arthroplasty/ abx spacer. Received Vancomycin previous admit 05/2015  Assessment/Plan: Vancomycin 1gm IV every 12 hours.  Goal trough 15-20 mcg/mL.  Temp (24hrs), Avg:97.5 F (36.4 C), Min:97.2 F (36.2 C), Max:97.8 F (36.6 C)  No results for input(s): WBC in the last 168 hours.  Invalid input(s):  CREATININE No results for input(s): CREATININE in the last 168 hours. Estimated Creatinine Clearance: 73.3 mL/min (by C-G formula based on Cr of 0.97).    Allergies  Allergen Reactions  . Amoxicillin Rash    Yeast Infection Has patient had a PCN reaction causing immediate rash, facial/tongue/throat swelling, SOB or lightheadedness with hypotension: No Has patient had a PCN reaction causing severe rash involving mucus membranes or skin necrosis: No Has patient had a PCN reaction that required hospitalization No Has patient had a PCN reaction occurring within the last 10 years: Yes If all of the above answers are "NO", then may proceed with Cephalosporin use.   Antimicrobials this admission: 4/10 Vancomycin  >>   Levels/dose changes this admission:  Microbiology results: 4/10 Synovial fluid Cx: sent Abx given after incision  3/31 MRSA PCR: negative  Thank you for allowing pharmacy to be a part of this patient's care.  Otho BellowsGreen,  L PharmD 02/28/2016 10:56 AM

## 2016-02-28 NOTE — Interval H&P Note (Signed)
History and Physical Interval Note:  02/28/2016 7:10 AM  David Mathews F [place]  has presented today for surgery, with the diagnosis of INFECTED RIGHT UNI KNEE ARTHROPLASTY  The various methods of treatment have been discussed with the patient and family. After consideration of risks, benefits and other options for treatment, the patient has consented to  Procedure(s): RESECTION OF RIGHT UNI KNEE WITH PLACMENT WITH ANTIBIOTIC SPACERS (Right) as a surgical intervention .  The patient's history has been reviewed, patient examined, no change in status, stable for surgery.  I have reviewed the patient's chart and labs.  Questions were answered to the patient's satisfaction.     Shelda PalLIN, D

## 2016-02-28 NOTE — Anesthesia Procedure Notes (Signed)
Procedure Name: Intubation Date/Time: 02/28/2016 8:28 AM Performed by: Army FossaPULLIAM,  DANE Pre-anesthesia Checklist: Patient identified, Emergency Drugs available, Suction available, Patient being monitored and Timeout performed Patient Re-evaluated:Patient Re-evaluated prior to inductionOxygen Delivery Method: Circle system utilized Preoxygenation: Pre-oxygenation with 100% oxygen Intubation Type: IV induction Ventilation: Mask ventilation without difficulty Laryngoscope Size: Glidescope and 4 Grade View: Grade I Tube type: Oral Number of attempts: 1 Airway Equipment and Method: Patient positioned with wedge pillow,  Video-laryngoscopy and Stylet Placement Confirmation: positive ETCO2,  CO2 detector and breath sounds checked- equal and bilateral (Video confirmation of ETT passing through open vocal cords. ) Secured at: 22 cm Tube secured with: Tape Dental Injury: Teeth and Oropharynx as per pre-operative assessment  Difficulty Due To: Difficulty was anticipated

## 2016-02-28 NOTE — Transfer of Care (Signed)
Immediate Anesthesia Transfer of Care Note  Patient: David Mathews  Procedure(s) Performed: Procedure(s): RESECTION OF RIGHT UNI KNEE WITH PLACMENT WITH ANTIBIOTIC SPACERS (Right)  Patient Location: PACU  Anesthesia Type:General  Level of Consciousness:  sedated, patient cooperative and responds to stimulation  Airway & Oxygen Therapy:Patient Spontanous Breathing and Patient connected to face mask oxgen  Post-op Assessment:  Report given to PACU RN and Post -op Vital signs reviewed and stable  Post vital signs:  Reviewed and stable  Last Vitals:  Filed Vitals:   02/28/16 0545  BP: 133/74  Pulse: 66  Temp: 36.2 C  Resp: 16    Complications: No apparent anesthesia complications

## 2016-02-28 NOTE — Brief Op Note (Signed)
02/28/2016  9:28 AM  PATIENT:  David Mathews  75 y.o. male  PRE-OPERATIVE DIAGNOSIS:  INFECTED RIGHT UNI KNEE ARTHROPLASTY  POST-OPERATIVE DIAGNOSIS:  INFECTED RIGHT UNI KNEE ARTHROPLASTY  PROCEDURE:  Procedure(s): RESECTION OF RIGHT UNI KNEE WITH PLACMENT WITH ANTIBIOTIC SPACERS (Right)  SURGEON:  Surgeon(s) and Role:    * Durene RomansMatthew , MD - Primary  PHYSICIAN ASSISTANT: Lanney GinsMatthew Babish, PA-C  ANESTHESIA:   general  EBL:  Total I/O In: 2000 [I.V.:2000] Out: 300 [Urine:300]  BLOOD ADMINISTERED:none  DRAINS: none   LOCAL MEDICATIONS USED:  MARCAINE     SPECIMEN:  Source of Specimen:  right knee, synovial fluid  DISPOSITION OF SPECIMEN:  PATHOLOGY  COUNTS:  YES  TOURNIQUET:   Total Tourniquet Time Documented: Thigh (Right) - 60 minutes Total: Thigh (Right) - 60 minutes   DICTATION: .Other Dictation: Dictation Number [id number]  PLAN OF CARE: Admit to inpatient   PATIENT DISPOSITION:  PACU - hemodynamically stable.   Delay start of Pharmacological VTE agent (>24hrs) due to surgical blood loss or risk of bleeding: no

## 2016-02-29 DIAGNOSIS — E039 Hypothyroidism, unspecified: Secondary | ICD-10-CM | POA: Diagnosis not present

## 2016-02-29 DIAGNOSIS — T8453XA Infection and inflammatory reaction due to internal right knee prosthesis, initial encounter: Secondary | ICD-10-CM | POA: Diagnosis not present

## 2016-02-29 DIAGNOSIS — E663 Overweight: Secondary | ICD-10-CM | POA: Diagnosis not present

## 2016-02-29 DIAGNOSIS — M25561 Pain in right knee: Secondary | ICD-10-CM | POA: Diagnosis not present

## 2016-02-29 DIAGNOSIS — E119 Type 2 diabetes mellitus without complications: Secondary | ICD-10-CM | POA: Diagnosis not present

## 2016-02-29 DIAGNOSIS — Z6828 Body mass index (BMI) 28.0-28.9, adult: Secondary | ICD-10-CM | POA: Diagnosis not present

## 2016-02-29 DIAGNOSIS — Z79899 Other long term (current) drug therapy: Secondary | ICD-10-CM | POA: Diagnosis not present

## 2016-02-29 LAB — GLUCOSE, CAPILLARY
GLUCOSE-CAPILLARY: 253 mg/dL — AB (ref 65–99)
Glucose-Capillary: 242 mg/dL — ABNORMAL HIGH (ref 65–99)
Glucose-Capillary: 303 mg/dL — ABNORMAL HIGH (ref 65–99)
Glucose-Capillary: 215 mg/dL — ABNORMAL HIGH (ref 65–99)

## 2016-02-29 LAB — CBC
HEMATOCRIT: 31.6 % — AB (ref 39.0–52.0)
Hemoglobin: 10.9 g/dL — ABNORMAL LOW (ref 13.0–17.0)
MCH: 29.9 pg (ref 26.0–34.0)
MCHC: 34.5 g/dL (ref 30.0–36.0)
MCV: 86.6 fL (ref 78.0–100.0)
PLATELETS: 232 10*3/uL (ref 150–400)
RBC: 3.65 MIL/uL — ABNORMAL LOW (ref 4.22–5.81)
RDW: 13.3 % (ref 11.5–15.5)
WBC: 9.4 10*3/uL (ref 4.0–10.5)

## 2016-02-29 LAB — BASIC METABOLIC PANEL
ANION GAP: 6 (ref 5–15)
BUN: 21 mg/dL — ABNORMAL HIGH (ref 6–20)
CALCIUM: 8.4 mg/dL — AB (ref 8.9–10.3)
CO2: 29 mmol/L (ref 22–32)
CREATININE: 0.94 mg/dL (ref 0.61–1.24)
Chloride: 98 mmol/L — ABNORMAL LOW (ref 101–111)
GFR calc Af Amer: >60 mL/min (ref >60)
Glucose, Bld: 253 mg/dL — ABNORMAL HIGH (ref 65–99)
Potassium: 4.5 mmol/L (ref 3.5–5.1)
Sodium: 133 mmol/L — ABNORMAL LOW (ref 135–145)

## 2016-02-29 LAB — C-REACTIVE PROTEIN: CRP: 2.6 mg/dL — ABNORMAL HIGH (ref <1.0)

## 2016-02-29 LAB — SEDIMENTATION RATE: SED RATE: 9 mm/h (ref 0–16)

## 2016-02-29 MED ORDER — SODIUM CHLORIDE 0.9% FLUSH
10.0000 mL | INTRAVENOUS | Status: DC | PRN
  Administered 2016-03-01 – 2016-03-02 (×2): 10 mL
  Filled 2016-02-29 (×2): qty 40

## 2016-02-29 NOTE — Progress Notes (Signed)
[facility].  Contact is Joe Sales promotion account executive(Director) [phone] Local contact in [place] to help coordinate further if needed is Christ Kickheryl Rose RN.  [phone].   If patient discharges after hours, please call [phone].   David Mathews 02/29/2016, 2:20 PM

## 2016-02-29 NOTE — Progress Notes (Signed)
Peripherally Inserted Central Catheter/Midline Placement  The IV Nurse has discussed with the patient and/or persons authorized to consent for the patient, the purpose of this procedure and the potential benefits and risks involved with this procedure.  The benefits include less needle sticks, lab draws from the catheter and patient may be discharged home with the catheter.  Risks include, but not limited to, infection, bleeding, blood clot (thrombus formation), and puncture of an artery; nerve damage and irregular heat beat.  Alternatives to this procedure were also discussed.Consent obtained by Riki Sheerindy Gibbs, RN  PICC/Midline Placement Documentation         , Lajean ManesKerry Loraine 02/29/2016, 11:41 AM

## 2016-02-29 NOTE — Care Management Note (Signed)
Case Management Note  Patient Details  Name: David Mathews MRN: [id number] Date of Birth: August 16, 1941  Subjective/Objective:                  RESECTION OF RIGHT UNI KNEE WITH PLACMENT WITH ANTIBIOTIC SPACERS (Right) Action/Plan: dsicharge planning Expected Discharge Date:                  Expected Discharge Plan:  [place]  In-House Referral:     Discharge planning Services  CM Consult  Post Acute Care Choice:  Home Health Choice offered to:  Patient, Adult Children  DME Arranged:  IV pump/equipment DME Agency:  [place] Arranged:  RN [facility] Agency:  [place]  Status of Service:  Completed, signed off  Medicare Important Message Given:    Date Medicare IM Given:    Medicare IM give by:    Date Additional Medicare IM Given:    Additional Medicare Important Message give by:     If discussed at [place] of Stay Meetings, dates discussed:    Additional Comments: CM met with pt and pt's son in room to offer choice of home health agency.  Pt chooses [facility] to render [facility] for IV ABX administration.  Referral called to [facility] rep, Santiago Glad and [facility] rep, Carolynn Sayers.  No other CM needs were communicated. Dellie Catholic, RN 02/29/2016, 10:11 AM

## 2016-02-29 NOTE — Progress Notes (Signed)
OT Cancellation Note  Patient Details Name: David Mathews F [place] MRN: [id number] DOB: 05/17/1941   Cancelled Treatment:    Reason Eval/Treat Not Completed: Patient at procedure or test/ unavailable -- patient getting PICC line. Will return at a later time to complete OT evaluation.  ,  A 02/29/2016, 11:30 AM

## 2016-02-29 NOTE — Progress Notes (Signed)
Physical Therapy Treatment Patient Details Name: David FlorenceRussell F Mathews MRN: [id number] DOB: 04/29/1941 Today's Date: 02/29/2016    History of Present Illness s/p resection right uni-knee with abx spacer    PT Comments    POD # 1 pm session Assisted OOB to amb a greater distance.  Instructed on to decrease gait speed to ensure PWB.    Follow Up Recommendations  Home health PT     Equipment Recommendations  None recommended by PT    Recommendations for Other Services       Precautions / Restrictions Precautions Precautions: Knee Restrictions Weight Bearing Restrictions: Yes RLE Weight Bearing: Partial weight bearing RLE Partial Weight Bearing Percentage or Pounds: 50    Mobility  Bed Mobility Overal bed mobility: Needs Assistance Bed Mobility: Sit to Supine     Supine to sit: Supervision     General bed mobility comments: assisted back to bed for IV team  Transfers Overall transfer level: Needs assistance Equipment used: Rolling walker (2 wheeled) Transfers: Sit to/from Stand Sit to Stand: Supervision;Min guard         General transfer comment: cues for hand placement and overall safety.  Pt impulsive.  Ambulation/Gait Ambulation/Gait assistance: Min guard Ambulation Distance (Feet): 80 Feet Assistive device: Rolling walker (2 wheeled) Gait Pattern/deviations: Step-to pattern;Step-through pattern;Decreased stance time - right Gait velocity: too quick   General Gait Details: VC's to decrease gait speed to increase safety and ensure PWB R LE   Stairs            Wheelchair Mobility    Modified Rankin (Stroke Patients Only)       Balance                                    Cognition Arousal/Alertness: Awake/alert Behavior During Therapy: WFL for tasks assessed/performed Overall Cognitive Status: Within Functional Limits for tasks assessed                      Exercises      General Comments        Pertinent  Vitals/Pain Pain Assessment: 0-10 Pain Score: 3  Pain Location: R knee Pain Descriptors / Indicators: Sore Pain Intervention(s): Limited activity within patient's tolerance;Monitored during session    Home Living Family/patient expects to be discharged to:: Private residence Living Arrangements: Children Available Help at Discharge: Family;Available PRN/intermittently Type of Home: House Home Access: Stairs to enter Entrance Stairs-Rails: Right;Left;Can reach both Home Layout: One level Home Equipment: Environmental consultantWalker - 2 wheels;Shower seat - built in;Crutches;Cane - single point      Prior Function Level of Independence: Independent      Comments: still works   PT Goals (current goals can now be found in the care plan section) Acute Rehab PT Goals Patient Stated Goal: home, back to I lifestyle/work Progress towards PT goals: Progressing toward goals    Frequency  7X/week    PT Plan Current plan remains appropriate    Co-evaluation             End of Session Equipment Utilized During Treatment: Gait belt Activity Tolerance: Patient tolerated treatment well;No increased pain Patient left: in bed;with call bell/phone within reach     Time: 1345-1405 PT Time Calculation (min) (ACUTE ONLY): 20 min  Charges:  $Gait Training: 8-22 mins  G Codes:      Rica Koyanagi  PTA [facility]  Acute  Rehab Pager      [phone]

## 2016-02-29 NOTE — Progress Notes (Signed)
Physical Therapy Treatment Patient Details Name: David FlorenceRussell F Kratz MRN: [id number] DOB: 05/08/1941 Today's Date: 02/29/2016    History of Present Illness s/p resection right uni-knee with abx spacer    PT Comments    POD # 1 am session Assisted with amb in hallway then to bathroom then back to bed for IV team.  Pt aware he is PWB.  VC's to decrease gait speed to ensure PWB and to increase safety.   Follow Up Recommendations  Home health PT     Equipment Recommendations  None recommended by PT    Recommendations for Other Services       Precautions / Restrictions Precautions Precautions: Knee Restrictions Weight Bearing Restrictions: Yes RLE Weight Bearing: Partial weight bearing RLE Partial Weight Bearing Percentage or Pounds: 50    Mobility  Bed Mobility Overal bed mobility: Needs Assistance Bed Mobility: Sit to Supine     Supine to sit: Supervision     General bed mobility comments: assisted back to bed for IV team  Transfers Overall transfer level: Needs assistance Equipment used: Rolling walker (2 wheeled) Transfers: Sit to/from Stand Sit to Stand: Supervision;Min guard         General transfer comment: cues for hand placement and overall safety.  Pt impulsive.  Ambulation/Gait Ambulation/Gait assistance: Min guard Ambulation Distance (Feet): 75 Feet Assistive device: Rolling walker (2 wheeled) Gait Pattern/deviations: Step-to pattern;Step-through pattern;Decreased stance time - right Gait velocity: too quick   General Gait Details: VC's to decrease gait speed to increase safety and ensure PWB R LE   Stairs            Wheelchair Mobility    Modified Rankin (Stroke Patients Only)       Balance                                    Cognition                            Exercises      General Comments        Pertinent Vitals/Pain Pain Assessment: 0-10 Pain Score: 3  Pain Location: R knee Pain  Descriptors / Indicators: Sore Pain Intervention(s): Monitored during session;Premedicated before session;Repositioned    Home Living                      Prior Function            PT Goals (current goals can now be found in the care plan section) Progress towards PT goals: Progressing toward goals    Frequency  7X/week    PT Plan Current plan remains appropriate    Co-evaluation             End of Session Equipment Utilized During Treatment: Gait belt Activity Tolerance: Patient tolerated treatment well;No increased pain Patient left: in bed;with call bell/phone within reach     Time: 1100-1125 PT Time Calculation (min) (ACUTE ONLY): 25 min  Charges:  $Gait Training: 8-22 mins $Therapeutic Activity: 8-22 mins                    G Codes:      Felecia ShellingLori   PTA [facility]  Acute  Rehab Pager      [phone]

## 2016-02-29 NOTE — Progress Notes (Signed)
[facility]  Patient Status:   New pt with [facility] this admission  [facility] is providing the following services: [facility] for home IV ABX.  [facility] and support transition home when ordered. [facility] will provide in hospital pt education to support independence at home upon DC.   If patient discharges after hours, please call [phone].   Sedalia Mutaamela S Chandler 02/29/2016, 11:45 AM

## 2016-02-29 NOTE — Progress Notes (Signed)
Patient ID: David Mathews, male   DOB: 11/11/1941, 75 y.o.   MRN: [id number] Subjective: 1 Day Post-Op Procedure(s) (LRB): RESECTION OF RIGHT UNI KNEE WITH PLACMENT WITH ANTIBIOTIC SPACERS (Right)    Patient reports pain as mild.  Son in room.  Doing well, no events, already been up walking.  He is pleased thus far.  PIC line not placed as of yet  Objective:   VITALS:   Filed Vitals:   02/29/16 1016 02/29/16 1407  BP: 143/73 127/59  Pulse: 83 97  Temp: 97.7 F (36.5 C) 98.2 F (36.8 C)  Resp: 16 16    Neurovascular intact Incision: dressing C/D/I  LABS  Recent Labs  02/29/16 0413  HGB 10.9*  HCT 31.6*  WBC 9.4  PLT 232     Recent Labs  02/29/16 0413  NA 133*  K 4.5  BUN 21*  CREATININE 0.94  GLUCOSE 253*    No results for input(s): LABPT, INR in the last 72 hours.   Assessment/Plan: 1 Day Post-Op Procedure(s) (LRB): RESECTION OF RIGHT UNI KNEE WITH PLACMENT WITH ANTIBIOTIC SPACERS (Right)   Advance diet Up with therapy Discharge home with home health tomorrow or Thursday  ID consult based on prior treatment and 6 week IV antibiotic recs PIC line today

## 2016-02-29 NOTE — Progress Notes (Signed)
Occupational Therapy Evaluation Patient Details Name: David Mathews MRN: [id number] DOB: 08/22/1941 Today's Date: 02/29/2016    History of Present Illness s/p resection right uni-knee with abx spacer   Clinical Impression   All OT education completed and pt questions answered. No further OT needs. Will sign off.    Follow Up Recommendations  No OT follow up;Supervision - Intermittent    Equipment Recommendations  None recommended by OT    Recommendations for Other Services       Precautions / Restrictions Precautions Precautions: Knee Restrictions Weight Bearing Restrictions: Yes RLE Weight Bearing: Partial weight bearing RLE Partial Weight Bearing Percentage or Pounds: 50      Mobility Bed Mobility              Transfers                 Balance                                            ADL Overall ADL's : Needs assistance/impaired                                       General ADL Comments: Reviewed LB bathing/dressing techniques with reacher, walk-in shower transfer technique, toilet transfer/toileting with patient. He was familiair with techniques from prior surgeries. Educated on differences with PWB status. Patient verbalized understanding but declined to practice. Son present and states patient will have assistance of family as needed at discharge. No further OT needs.     Vision     Perception     Praxis      Pertinent Vitals/Pain Pain Assessment: 0-10 Pain Score: 3  Pain Location: R knee Pain Descriptors / Indicators: Sore Pain Intervention(s): Limited activity within patient's tolerance;Monitored during session     Hand Dominance Right   Extremity/Trunk Assessment Upper Extremity Assessment Upper Extremity Assessment: Overall WFL for tasks assessed   Lower Extremity Assessment Lower Extremity Assessment: Defer to PT evaluation       Communication Communication Communication: No  difficulties   Cognition Arousal/Alertness: Awake/alert Behavior During Therapy: WFL for tasks assessed/performed Overall Cognitive Status: Within Functional Limits for tasks assessed                     General Comments       Exercises       Shoulder Instructions      Home Living Family/patient expects to be discharged to:: Private residence Living Arrangements: Children Available Help at Discharge: Family;Available PRN/intermittently Type of Home: House Home Access: Stairs to enter Entergy CorporationEntrance Stairs-Number of Steps: short steps and then a ramp Entrance Stairs-Rails: Right;Left;Can reach both Home Layout: One level     Bathroom Shower/Tub: Producer, television/film/videoWalk-in shower   Bathroom Toilet: Handicapped height Bathroom Accessibility: Yes How Accessible: Accessible via walker Home Equipment: Walker - 2 wheels;Shower seat - built in;Crutches;Cane - single point          Prior Functioning/Environment Level of Independence: Independent        Comments: still works    OT Diagnosis: Acute pain   OT Problem List: Decreased strength;Decreased range of motion;Pain   OT Treatment/Interventions:      OT Goals(Current goals can be found in the care plan section) Acute Rehab OT Goals Patient Stated  Goal: home, back to I lifestyle/work OT Goal Formulation: All assessment and education complete, DC therapy  OT Frequency:     Barriers to D/C:            Co-evaluation              End of Session    Activity Tolerance: Patient tolerated treatment well Patient left: in chair;with call bell/phone within reach;with family/visitor present;with chair alarm set   Time: 1435-1451 OT Time Calculation (min): 16 min Charges:  OT General Charges $OT Visit: 1 Procedure OT Evaluation $OT Eval Low Complexity: 1 Procedure G-Codes:    ,  A 14-Mar-2016, 3:38 PM

## 2016-03-01 DIAGNOSIS — T8453XD Infection and inflammatory reaction due to internal right knee prosthesis, subsequent encounter: Secondary | ICD-10-CM | POA: Diagnosis not present

## 2016-03-01 DIAGNOSIS — Z79899 Other long term (current) drug therapy: Secondary | ICD-10-CM | POA: Diagnosis not present

## 2016-03-01 DIAGNOSIS — B9689 Other specified bacterial agents as the cause of diseases classified elsewhere: Secondary | ICD-10-CM | POA: Diagnosis not present

## 2016-03-01 DIAGNOSIS — M00861 Arthritis due to other bacteria, right knee: Secondary | ICD-10-CM | POA: Diagnosis not present

## 2016-03-01 DIAGNOSIS — M25561 Pain in right knee: Secondary | ICD-10-CM | POA: Diagnosis not present

## 2016-03-01 DIAGNOSIS — Z6828 Body mass index (BMI) 28.0-28.9, adult: Secondary | ICD-10-CM | POA: Diagnosis not present

## 2016-03-01 DIAGNOSIS — Y838 Other surgical procedures as the cause of abnormal reaction of the patient, or of later complication, without mention of misadventure at the time of the procedure: Secondary | ICD-10-CM

## 2016-03-01 DIAGNOSIS — E039 Hypothyroidism, unspecified: Secondary | ICD-10-CM | POA: Diagnosis not present

## 2016-03-01 DIAGNOSIS — E119 Type 2 diabetes mellitus without complications: Secondary | ICD-10-CM | POA: Diagnosis not present

## 2016-03-01 DIAGNOSIS — E663 Overweight: Secondary | ICD-10-CM | POA: Diagnosis present

## 2016-03-01 DIAGNOSIS — T8453XA Infection and inflammatory reaction due to internal right knee prosthesis, initial encounter: Secondary | ICD-10-CM | POA: Diagnosis not present

## 2016-03-01 LAB — CBC
HEMATOCRIT: 29.8 % — AB (ref 39.0–52.0)
HEMOGLOBIN: 10.7 g/dL — AB (ref 13.0–17.0)
MCH: 31.1 pg (ref 26.0–34.0)
MCHC: 35.9 g/dL (ref 30.0–36.0)
MCV: 86.6 fL (ref 78.0–100.0)
Platelets: 228 10*3/uL (ref 150–400)
RBC: 3.44 MIL/uL — ABNORMAL LOW (ref 4.22–5.81)
RDW: 13.5 % (ref 11.5–15.5)
WBC: 8.6 10*3/uL (ref 4.0–10.5)

## 2016-03-01 LAB — HEMOGLOBIN A1C
Hgb A1c MFr Bld: 10 % — ABNORMAL HIGH (ref 4.8–5.6)
MEAN PLASMA GLUCOSE: 240 mg/dL

## 2016-03-01 LAB — BASIC METABOLIC PANEL
ANION GAP: 7 (ref 5–15)
BUN: 15 mg/dL (ref 6–20)
CHLORIDE: 99 mmol/L — AB (ref 101–111)
CO2: 29 mmol/L (ref 22–32)
Calcium: 9.3 mg/dL (ref 8.9–10.3)
Creatinine, Ser: 0.71 mg/dL (ref 0.61–1.24)
GFR calc non Af Amer: >60 mL/min (ref >60)
Glucose, Bld: 166 mg/dL — ABNORMAL HIGH (ref 65–99)
Potassium: 4.1 mmol/L (ref 3.5–5.1)
Sodium: 135 mmol/L (ref 135–145)

## 2016-03-01 LAB — GLUCOSE, CAPILLARY
GLUCOSE-CAPILLARY: 223 mg/dL — AB (ref 65–99)
GLUCOSE-CAPILLARY: 150 mg/dL — AB (ref 65–99)
Glucose-Capillary: 177 mg/dL — ABNORMAL HIGH (ref 65–99)
Glucose-Capillary: 203 mg/dL — ABNORMAL HIGH (ref 65–99)

## 2016-03-01 MED ORDER — DOCUSATE SODIUM 100 MG PO CAPS: 100.0000 mg | ORAL_CAPSULE | Freq: Two times a day (BID) | ORAL | Status: DC

## 2016-03-01 MED ORDER — VANCOMYCIN HCL IN DEXTROSE 1-5 GM/200ML-% IV SOLN: 1000.0000 mg | Freq: Two times a day (BID) | INTRAVENOUS | Status: DC

## 2016-03-01 MED ORDER — CEFAZOLIN SODIUM-DEXTROSE 2-4 GM/100ML-% IV SOLN
2.0000 g | Freq: Three times a day (TID) | INTRAVENOUS | Status: DC
  Administered 2016-03-01 – 2016-03-02 (×3): 2 g via INTRAVENOUS
  Filled 2016-03-01 (×4): qty 100

## 2016-03-01 MED ORDER — POLYETHYLENE GLYCOL 3350 17 G PO PACK: 17.0000 g | PACK | Freq: Two times a day (BID) | ORAL | Status: DC

## 2016-03-01 MED ORDER — HEPARIN SOD (PORK) LOCK FLUSH 100 UNIT/ML IV SOLN: 250.0000 [IU] | INTRAVENOUS | Status: DC | PRN

## 2016-03-01 MED ORDER — FERROUS SULFATE 325 (65 FE) MG PO TABS: 325.0000 mg | ORAL_TABLET | Freq: Three times a day (TID) | ORAL | Status: DC

## 2016-03-01 MED ORDER — TIZANIDINE HCL 4 MG PO TABS: 4.0000 mg | ORAL_TABLET | Freq: Four times a day (QID) | ORAL | Status: DC | PRN

## 2016-03-01 MED ORDER — ACETAMINOPHEN 325 MG PO TABS: 650.0000 mg | ORAL_TABLET | Freq: Four times a day (QID) | ORAL | Status: DC | PRN

## 2016-03-01 MED ORDER — OXYCODONE HCL 5 MG PO TABS: 5.0000 mg | ORAL_TABLET | ORAL | Status: DC | PRN

## 2016-03-01 MED ORDER — ASPIRIN 325 MG PO TBEC: 325.0000 mg | DELAYED_RELEASE_TABLET | Freq: Two times a day (BID) | ORAL | Status: AC

## 2016-03-01 MED ORDER — CELECOXIB 200 MG PO CAPS: 200.0000 mg | ORAL_CAPSULE | Freq: Two times a day (BID) | ORAL | Status: DC

## 2016-03-01 NOTE — Progress Notes (Signed)
Physical Therapy Treatment Patient Details Name: David Mathews MRN: [id number] DOB: 06/11/1941 Today's Date: 03/01/2016    History of Present Illness s/p resection right uni-knee with abx spacer    PT Comments    Assisted with performing TE's following HEP.  Instructed on proper tech and frea.  Explained Articulating Knee Spacer, only restriction is PWB for approx 6 - 8 weeks until his next knee surgery.  Applied ICE.   Pt ready for D/C to home.   Follow Up Recommendations  Home health PT     Equipment Recommendations  None recommended by PT    Recommendations for Other Services       Precautions / Restrictions Precautions Precaution Comments: explained Articulating Spacer  Restrictions Weight Bearing Restrictions: Yes RLE Weight Bearing: Partial weight bearing RLE Partial Weight Bearing Percentage or Pounds: 50%    Mobility  Bed Mobility               General bed mobility comments: Pt OOB in recliner  Transfers Overall transfer level: Needs assistance Equipment used: Rolling walker (2 wheeled) Transfers: Sit to/from Stand Sit to Stand: Supervision         General transfer comment: good safety cognition and use of hands to steady self  Ambulation/Gait Did not amb.  Tx session focused on HEP and activity level.  Stairs Stairs:  (has ramp)          Wheelchair Mobility    Modified Rankin (Stroke Patients Only)       Balance                                    Cognition Arousal/Alertness: Awake/alert Behavior During Therapy: WFL for tasks assessed/performed Overall Cognitive Status: Within Functional Limits for tasks assessed                      Exercises      General Comments        Pertinent Vitals/Pain Pain Assessment: 0-10 Pain Score: 4  Pain Location: R knee Pain Descriptors / Indicators: Tender Pain Intervention(s): Monitored during session;Premedicated before session;Repositioned;Ice applied     Home Living                      Prior Function            PT Goals (current goals can now be found in the care plan section) Progress towards PT goals: Progressing toward goals    Frequency  7X/week    PT Plan Current plan remains appropriate    Co-evaluation             End of Session Equipment Utilized During Treatment: Gait belt Activity Tolerance: Patient tolerated treatment well;No increased pain Patient left: in chair;with call bell/phone within reach     Time: 0930-1000 PT Time Calculation (min) (ACUTE ONLY): 30 min  Charges:  $Therapeutic Avtivity $Therapeutic Exercise: 8-22 mins                    G Codes:      Armando ReichertKropski,  Ann 03/01/2016, 1:14 PM

## 2016-03-01 NOTE — Progress Notes (Signed)
Pharmacy Antibiotic Follow-up Note  David Mathews is a 75 y.o. year-old male admitted on 02/28/2016.  The patient is currently on day 3 of Vancomycin  for infected R knee arthroplasty/ abx spacer. Received Vancomycin previous admit 05/2015  Assessment/Plan: Vancomycin 1gm IV every 12 hours.  Goal trough 15-20 mcg/mL.  Vancomycin trough today at 1900 ID consult planned  Temp (24hrs), Avg:98 F (36.7 C), Min:97.7 F (36.5 C), Max:98.2 F (36.8 C)   Recent Labs Lab 02/29/16 0413 03/01/16 0405  WBC 9.4 8.6     Recent Labs Lab 02/29/16 0413 03/01/16 0405  CREATININE 0.94 0.71   Estimated Creatinine Clearance: 88.9 mL/min (by C-G formula based on Cr of 0.71).    Allergies  Allergen Reactions  . Amoxicillin Rash    Yeast Infection Has patient had a PCN reaction causing immediate rash, facial/tongue/throat swelling, SOB or lightheadedness with hypotension: No Has patient had a PCN reaction causing severe rash involving mucus membranes or skin necrosis: No Has patient had a PCN reaction that required hospitalization No Has patient had a PCN reaction occurring within the last 10 years: Yes If all of the above answers are "NO", then may proceed with Cephalosporin use.   Antimicrobials this admission: 4/10 Vancomycin  >>   Levels/dose changes this admission:  Microbiology results: 4/10 Synovial fluid Cx: ngtd, no organisms on gram stain Abx given after incision  3/31 MRSA PCR: negative  Thank you for allowing pharmacy to be a part of this patient's care.

## 2016-03-01 NOTE — Progress Notes (Signed)
[facility]    [facility] providing the following services: [employer]  If patient discharges after hours, please call [phone].   Renard HamperLecretia Williamson 03/01/2016, 11:26 AM

## 2016-03-01 NOTE — Progress Notes (Signed)
CM received notice from Jeri ModenaPam Chandler pt would [facility].  No other CM needs were communicated.

## 2016-03-01 NOTE — Consult Note (Addendum)
[facility] for Infectious Disease       Reason for Consult: PJI s/p resection arthroplasty    Referring Physician: Dr Alvan Dame  Principal Problem:   Infected right KA Active Problems:   Overweight (BMI 25.0-29.9)   . aspirin EC  325 mg Oral BID  . celecoxib  200 mg Oral Q12H  . docusate sodium  100 mg Oral BID  . ferrous sulfate  325 mg Oral TID PC  . finasteride  5 mg Oral Daily  . insulin aspart  0-15 Units Subcutaneous TID WC  . insulin aspart  0-5 Units Subcutaneous QHS  . insulin glargine  55 Units Subcutaneous Daily  . levothyroxine  88 mcg Oral QAC breakfast  . oxyCODONE  5-15 mg Oral Q4H  . polyethylene glycol  17 g Oral BID  . pregabalin  225 mg Oral BID  . simvastatin  20 mg Oral Daily  . tamsulosin  0.4 mg Oral QPC breakfast  . vancomycin  1,000 mg Intravenous Q12H    Recommendations: Cefazolin for 6 weeks through May 22nd Antibiotics per protocol ESR, CRP every 2 weeks Cmp, cbc to [facility] weekly [phone]  We will arrange follow up in our office in 3-4 weeks  Assessment: He has recurrent PJI s/p resection, previous polyexchange.   CRP 2.6, ESR 9.  No culture growth to date.   Antibiotics: Vancomycin day 2  HPI: David Mathews is a 75 y.o. male with history of arthritis with history of bilateral knee replacements in 9735, without complications. On May 20 2015, he also underwent spinal fusion procedure by Dr. Hal Neer. Two weeks later, he began feeling pain in his right knee which became progressively swollen. He denied fever during that time. He also says he hasn't been able to close his right hand around a golf club for the last month; he was able to do this beforehand. He says he was on plaquenil for RA in the past but I don't see this on his drug list. His left ring finger has become progressively red during this time.  He underwent irrigation and debridement in the OR for both his right knee (polyexchange) and spine on 7/26. Intra-op gram stain  of the knee revealed MSSA and he was treated for 6 weeks with IV cefazolin.  He was the transitioned to oral continuation therapy with Augmentin, but developed a rash and changed to an oral cephalosporin.  He continued on this but noted swelling of knee and twisting.  He was then seen by Dr. Alvan Dame and taken to OR for resection arthroplasty on 4/10 with spacer placement.  Cultures to date are ngtd.  He is worried about his upcoming work-related trip to [place].     Review of Systems:  Constitutional: negative for fevers and chills Gastrointestinal: negative for diarrhea All other systems reviewed and are negative   Past Medical History  Diagnosis Date  . Hypothyroidism   . Diabetes mellitus without complication ([place])   . Arthritis   . Infected prosthetic knee joint, right unicompartmental knee  06/14/2015  . Postoperative wound infection 07/19/2015  . MSSA (methicillin susceptible Staphylococcus aureus) infection 07/19/2015  . Neuromuscular disorder ([place])     neuropathy     Social History  Substance Use Topics  . Smoking status: Former Smoker -- 1.00 packs/day for 10 years    Types: Cigarettes    Quit date: 05/17/1999  . Smokeless tobacco: Never Used  . Alcohol Use: No     Comment: occasional glass of  wine     FMHx: no rheumatoid arthritis  Allergies  Allergen Reactions  . Amoxicillin Rash    Yeast Infection Has patient had a PCN reaction causing immediate rash, facial/tongue/throat swelling, SOB or lightheadedness with hypotension: No Has patient had a PCN reaction causing severe rash involving mucus membranes or skin necrosis: No Has patient had a PCN reaction that required hospitalization No Has patient had a PCN reaction occurring within the last 10 years: Yes If all of the above answers are "NO", then may proceed with Cephalosporin use.    Physical Exam: Constitutional: in no apparent distress and alert  Filed Vitals:   03/01/16 0416 03/01/16 1355  BP: 120/66 117/73    Pulse: 70 81  Temp: 97.7 F (36.5 C) 97.5 F (36.4 C)  Resp: 16 16   EYES: anicteric ENMT: no thrush Cardiovascular: Cor RRR Respiratory: CTA B; normal respiratory effort GI: Bowel sounds are normal, liver is not enlarged, spleen is not enlarged Musculoskeletal: no pedal edema noted Skin: negatives: no rash Hematologic: no cervical lad  Lab Results  Component Value Date   WBC 8.6 03/01/2016   HGB 10.7* 03/01/2016   HCT 29.8* 03/01/2016   MCV 86.6 03/01/2016   PLT 228 03/01/2016    Lab Results  Component Value Date   CREATININE 0.71 03/01/2016   BUN 15 03/01/2016   NA 135 03/01/2016   K 4.1 03/01/2016   CL 99* 03/01/2016   CO2 29 03/01/2016    Lab Results  Component Value Date   ALT 17 10/07/2015   AST 16 10/07/2015   ALKPHOS 110 10/07/2015     Microbiology: Recent Results (from the past 240 hour(s))  Anaerobic culture     Status: None (Preliminary result)   Collection Time: 02/28/16  8:15 AM  Result Value Ref Range Status   Specimen Description SYNOVIAL RIGHT KNEE  Final   Special Requests NONE  Final   Gram Stain   Final    WBC PRESENT, PREDOMINANTLY PMN NO ORGANISMS SEEN Performed at [facility]    Culture   Final    NO ANAEROBES ISOLATED; CULTURE IN PROGRESS FOR 5 DAYS   Report Status PENDING  Incomplete  Body fluid culture     Status: None (Preliminary result)   Collection Time: 02/28/16  8:15 AM  Result Value Ref Range Status   Specimen Description SYNOVIAL RIGHT KNEE  Final   Special Requests NONE  Final   Gram Stain   Final    WBC PRESENT, PREDOMINANTLY PMN NO ORGANISMS SEEN Gram Stain Report Called to,Read Back By and Verified With: Red Christians RN '@[id number]'$  ON 4.10.17 BY MCCOY,N.    Culture   Final    NO GROWTH 2 DAYS Performed at [facility]    Report Status PENDING  Incomplete    Scharlene Gloss, [place] for Infectious Disease [facility] [email or web address] [phone] pager  [phone]  cell 03/01/2016, 3:13 PM

## 2016-03-01 NOTE — Progress Notes (Signed)
     Subjective: 2 Days Post-Op Procedure(s) (LRB): RESECTION OF RIGHT UNI KNEE WITH PLACMENT WITH ANTIBIOTIC SPACERS (Right)   Patient reports pain as mild, pain controlled. No events throughout the night.  Ready to be discharged home after ID has seen the patient.  Objective:   VITALS:   Filed Vitals:   02/29/16 2129 03/01/16 0416  BP: 155/79 120/66  Pulse: 70 70  Temp: 98.1 F (36.7 C) 97.7 F (36.5 C)  Resp: 16 16    Dorsiflexion/Plantar flexion intact Incision: dressing C/D/I No cellulitis present Compartment soft  LABS  Recent Labs  02/29/16 0413 03/01/16 0405  HGB 10.9* 10.7*  HCT 31.6* 29.8*  WBC 9.4 8.6  PLT 232 228     Recent Labs  02/29/16 0413 03/01/16 0405  NA 133* 135  K 4.5 4.1  BUN 21* 15  CREATININE 0.94 0.71  GLUCOSE 253* 166*     Assessment/Plan: 2 Days Post-Op Procedure(s) (LRB): RESECTION OF RIGHT UNI KNEE WITH PLACMENT WITH ANTIBIOTIC SPACERS (Right) ID consult today with recommendations Up with therapy Discharge home with home health  [facility] Follow up with OLIN, D in 2 weeks.  Contact information:  [facility] [address], [address] [place] [id number] [phone]    Overweight (BMI 25-29.9) Estimated body mass index is 28.64 kg/(m^2) as calculated from the following:   Height as of this encounter: 5\' 9"  (1.753 m).   Weight as of this encounter: 87.998 kg (194 lb). Patient also counseled that weight may inhibit the healing process Patient counseled that losing weight will help with future health issues         Anastasio AuerbachMatthew S.    PAC  03/01/2016, 9:40 AM

## 2016-03-01 NOTE — Care Management Obs Status (Signed)
MEDICARE OBSERVATION STATUS NOTIFICATION   Patient Details  Name: David Mathews MRN: [id number] Date of Birth: 03/14/1941   Medicare Observation Status Notification Given:  Yes  Moon and C44 signed, copy given to pt and original to CMA to be scanned into medical record.  Yves DillJeffries,  Christine, RN 03/01/2016, 8:57 AM

## 2016-03-02 DIAGNOSIS — M25561 Pain in right knee: Secondary | ICD-10-CM | POA: Diagnosis not present

## 2016-03-02 DIAGNOSIS — Z96651 Presence of right artificial knee joint: Secondary | ICD-10-CM | POA: Diagnosis not present

## 2016-03-02 DIAGNOSIS — Z452 Encounter for adjustment and management of vascular access device: Secondary | ICD-10-CM | POA: Diagnosis not present

## 2016-03-02 DIAGNOSIS — Z6828 Body mass index (BMI) 28.0-28.9, adult: Secondary | ICD-10-CM | POA: Diagnosis not present

## 2016-03-02 DIAGNOSIS — R2689 Other abnormalities of gait and mobility: Secondary | ICD-10-CM | POA: Diagnosis not present

## 2016-03-02 DIAGNOSIS — E039 Hypothyroidism, unspecified: Secondary | ICD-10-CM | POA: Diagnosis not present

## 2016-03-02 DIAGNOSIS — Z79899 Other long term (current) drug therapy: Secondary | ICD-10-CM | POA: Diagnosis not present

## 2016-03-02 DIAGNOSIS — E119 Type 2 diabetes mellitus without complications: Secondary | ICD-10-CM | POA: Diagnosis not present

## 2016-03-02 DIAGNOSIS — Z794 Long term (current) use of insulin: Secondary | ICD-10-CM | POA: Diagnosis not present

## 2016-03-02 DIAGNOSIS — T8453XA Infection and inflammatory reaction due to internal right knee prosthesis, initial encounter: Secondary | ICD-10-CM | POA: Diagnosis not present

## 2016-03-02 DIAGNOSIS — E663 Overweight: Secondary | ICD-10-CM | POA: Diagnosis not present

## 2016-03-02 HISTORY — PX: OTHER SURGICAL HISTORY: SHX169

## 2016-03-02 LAB — BODY FLUID CULTURE: Culture: NO GROWTH

## 2016-03-02 LAB — GLUCOSE, CAPILLARY
GLUCOSE-CAPILLARY: 134 mg/dL — AB (ref 65–99)
Glucose-Capillary: 91 mg/dL (ref 65–99)

## 2016-03-02 MED ORDER — CEFAZOLIN SODIUM-DEXTROSE 2-4 GM/100ML-% IV SOLN: 2.0000 g | Freq: Three times a day (TID) | INTRAVENOUS | Status: AC

## 2016-03-02 MED ORDER — HEPARIN SOD (PORK) LOCK FLUSH 100 UNIT/ML IV SOLN
250.0000 [IU] | INTRAVENOUS | Status: AC | PRN
  Administered 2016-03-02: 250 [IU]

## 2016-03-02 MED ORDER — CEFAZOLIN SODIUM-DEXTROSE 2-4 GM/100ML-% IV SOLN: 2.0000 g | Freq: Three times a day (TID) | INTRAVENOUS | Status: DC

## 2016-03-02 NOTE — Progress Notes (Signed)
Picc line was capped and flushed, picc going home.

## 2016-03-02 NOTE — Progress Notes (Signed)
Patient ID: David Mathews, male   DOB: 07/11/1941, 75 y.o.   MRN: [id number] Subjective: 3 Days Post-Op Procedure(s) (LRB): RESECTION OF RIGHT UNI KNEE WITH PLACMENT WITH ANTIBIOTIC SPACERS (Right)    Patient reports pain as mild.  Objective:   VITALS:   Filed Vitals:   03/01/16 2235 03/02/16 0445  BP: 123/65 114/68  Pulse: 75 73  Temp: 98.1 F (36.7 C) 97.9 F (36.6 C)  Resp: 16 16    Neurovascular intact Incision: dressing C/D/I  LABS  Recent Labs  02/29/16 0413 03/01/16 0405  HGB 10.9* 10.7*  HCT 31.6* 29.8*  WBC 9.4 8.6  PLT 232 228     Recent Labs  02/29/16 0413 03/01/16 0405  NA 133* 135  K 4.5 4.1  BUN 21* 15  CREATININE 0.94 0.71  GLUCOSE 253* 166*    No results for input(s): LABPT, INR in the last 72 hours.   Assessment/Plan: 3 Days Post-Op Procedure(s) (LRB): RESECTION OF RIGHT UNI KNEE WITH PLACMENT WITH ANTIBIOTIC SPACERS (Right)   Discharge home with home health  IV per ID Appreciate ID input and following of patient RTC 4/24 PWB, ROM permitted

## 2016-03-04 LAB — ANAEROBIC CULTURE

## 2016-03-06 DIAGNOSIS — Z96651 Presence of right artificial knee joint: Secondary | ICD-10-CM | POA: Diagnosis not present

## 2016-03-06 DIAGNOSIS — R2689 Other abnormalities of gait and mobility: Secondary | ICD-10-CM | POA: Diagnosis not present

## 2016-03-06 DIAGNOSIS — E119 Type 2 diabetes mellitus without complications: Secondary | ICD-10-CM | POA: Diagnosis not present

## 2016-03-06 DIAGNOSIS — Z794 Long term (current) use of insulin: Secondary | ICD-10-CM | POA: Diagnosis not present

## 2016-03-06 DIAGNOSIS — T8453XA Infection and inflammatory reaction due to internal right knee prosthesis, initial encounter: Secondary | ICD-10-CM | POA: Diagnosis not present

## 2016-03-06 DIAGNOSIS — Z452 Encounter for adjustment and management of vascular access device: Secondary | ICD-10-CM | POA: Diagnosis not present

## 2016-03-07 DIAGNOSIS — Z452 Encounter for adjustment and management of vascular access device: Secondary | ICD-10-CM | POA: Diagnosis not present

## 2016-03-07 DIAGNOSIS — Z96651 Presence of right artificial knee joint: Secondary | ICD-10-CM | POA: Diagnosis not present

## 2016-03-07 DIAGNOSIS — T8453XA Infection and inflammatory reaction due to internal right knee prosthesis, initial encounter: Secondary | ICD-10-CM | POA: Diagnosis not present

## 2016-03-07 DIAGNOSIS — Z794 Long term (current) use of insulin: Secondary | ICD-10-CM | POA: Diagnosis not present

## 2016-03-07 DIAGNOSIS — E119 Type 2 diabetes mellitus without complications: Secondary | ICD-10-CM | POA: Diagnosis not present

## 2016-03-07 DIAGNOSIS — R2689 Other abnormalities of gait and mobility: Secondary | ICD-10-CM | POA: Diagnosis not present

## 2016-03-08 DIAGNOSIS — Z96651 Presence of right artificial knee joint: Secondary | ICD-10-CM | POA: Diagnosis not present

## 2016-03-08 DIAGNOSIS — T8453XA Infection and inflammatory reaction due to internal right knee prosthesis, initial encounter: Secondary | ICD-10-CM | POA: Diagnosis not present

## 2016-03-08 NOTE — Progress Notes (Signed)
   02/29/16 1500  OT Time Calculation  OT Start Time (ACUTE ONLY) 1435  OT Stop Time (ACUTE ONLY) 1451  OT Time Calculation (min) 16 min  OT G-codes **NOT FOR INPATIENT CLASS**  Functional Assessment Tool Used clinical judgment  Functional Limitation Self care  Self Care Current Status (W0981(G8987) CI  Self Care Goal Status (X9147(G8988) CI  Self Care Discharge Status (W2956(G8989) CI  OT General Charges  $OT Visit 1 Procedure  OT Evaluation  $OT Eval Low Complexity 1 Procedure

## 2016-03-10 DIAGNOSIS — R2689 Other abnormalities of gait and mobility: Secondary | ICD-10-CM | POA: Diagnosis not present

## 2016-03-10 DIAGNOSIS — E119 Type 2 diabetes mellitus without complications: Secondary | ICD-10-CM | POA: Diagnosis not present

## 2016-03-10 DIAGNOSIS — Z96651 Presence of right artificial knee joint: Secondary | ICD-10-CM | POA: Diagnosis not present

## 2016-03-10 DIAGNOSIS — T8453XA Infection and inflammatory reaction due to internal right knee prosthesis, initial encounter: Secondary | ICD-10-CM | POA: Diagnosis not present

## 2016-03-10 DIAGNOSIS — Z452 Encounter for adjustment and management of vascular access device: Secondary | ICD-10-CM | POA: Diagnosis not present

## 2016-03-10 DIAGNOSIS — Z794 Long term (current) use of insulin: Secondary | ICD-10-CM | POA: Diagnosis not present

## 2016-03-13 ENCOUNTER — Other Ambulatory Visit: Payer: Self-pay | Admitting: *Deleted

## 2016-03-13 ENCOUNTER — Telehealth: Payer: Self-pay | Admitting: *Deleted

## 2016-03-13 ENCOUNTER — Other Ambulatory Visit: Payer: Self-pay | Admitting: Internal Medicine

## 2016-03-13 DIAGNOSIS — Z4789 Encounter for other orthopedic aftercare: Secondary | ICD-10-CM | POA: Diagnosis not present

## 2016-03-13 DIAGNOSIS — T8453XD Infection and inflammatory reaction due to internal right knee prosthesis, subsequent encounter: Secondary | ICD-10-CM | POA: Diagnosis not present

## 2016-03-13 MED ORDER — FLUCONAZOLE 200 MG PO TABS: 200.0000 mg | ORAL_TABLET | Freq: Every day | ORAL | Status: DC

## 2016-03-13 NOTE — Discharge Summary (Signed)
Physician Discharge Summary  Patient ID: David Mathews MRN: [id number] DOB/AGE: 1941-10-29 75 y.o.  Admit date: 02/28/2016 Discharge date: 03/02/2016   Procedures:  Procedure(s) (LRB): RESECTION OF RIGHT UNI KNEE WITH PLACMENT WITH ANTIBIOTIC SPACERS (Right)  Attending Physician:  Dr. Durene Mathews   Admission Diagnoses:   Infected right unicompartmental knee arthroplasty   Discharge Diagnoses:  Principal Problem:   Infected right KA Active Problems:   Overweight (BMI 25.0-29.9)  Past Medical History  Diagnosis Date  . Hypothyroidism   . Diabetes mellitus without complication (HCC)   . Arthritis   . Infected prosthetic knee joint, right unicompartmental knee  06/14/2015  . Postoperative wound infection 07/19/2015  . MSSA (methicillin susceptible Staphylococcus aureus) infection 07/19/2015  . Neuromuscular disorder (HCC)     neuropathy     HPI:    Pt is a 75 y.o. male complaining of right knee pain for 1+ years. Pain had continually increased since the beginning. X-rays in the clinic show previous UKA of the right knee. Patient had his original UKA in 2010 with a subsequent washout of the knee in July 2016, by Dr. Lorretta Mathews. Pt has tried various conservative treatments which have failed to alleviate their symptoms. Various options are discussed with the patient. Risks, benefits and expectations were discussed with the patient. Patient understand the risks, benefits and expectations and wishes to proceed with surgery.   PCP: David Mathews   Discharged Condition: good  Hospital Course:  Patient underwent the above stated procedure on 02/28/2016. Patient tolerated the procedure well and brought to the recovery room in good condition and subsequently to the floor.  POD #1 BP: 127/59 ; Pulse: 97 ; Temp: 98.2 F (36.8 C) ; Resp: 16 Patient reports pain as mild. Son in room. Doing well, no events, already been up walking. He is pleased thus far. PIC line not placed as  of yet. Neurovascular intact and incision: dressing C/D/I.  LABS  Basename    HGB     10.9  HCT     31.6   POD #2  BP: 120/66 ; Pulse: 70 ; Temp: 97.7 F (36.5 C) ; Resp: 16 Patient reports pain as mild, pain controlled. No events throughout the night. Dorsiflexion/plantar flexion intact, incision: dressing C/D/I, no cellulitis present and compartment soft.   LABS  Basename    HGB     10.7  HCT     29.8   POD #3  BP: 114/68 ; Pulse: 73 ; Temp: 97.9 F (36.6 C) ; Resp: 16 Patient reports pain as mild.  No events throughout the night.  Ready to be discharged home.  LABS   No new labs   Discharge Exam: General appearance: alert, cooperative and no distress Extremities: Homans sign is negative, no sign of DVT, no edema, redness or tenderness in the calves or thighs and no ulcers, gangrene or trophic changes  Disposition: Home with follow up in 2 weeks   Follow-up Information    Follow up with [facility].   Why:  home health IV antibiotic pharmacy   Contact information:   [address] [place] [address] [phone]       Follow up with [facility].   Why:  home health nurse for PICC line care and IV administration   Contact information:   [address] [place] [address] [phone]       Follow up with David Pal, MD. Schedule an appointment as soon as  possible for a visit in 2 weeks.   Specialty:  Orthopedic Surgery   Contact information:   [address] [place] [address] [phone]       Follow up with David RighterOMER, ROBERT, MD. Schedule an appointment as soon as possible for a visit in 3 weeks.   Specialty:  Infectious Diseases   Contact information:   [address] [place] [address] [phone]       Discharge Instructions    Call MD / Call 911    Complete by:  As directed   If you experience chest pain or shortness of breath, CALL 911 and be transported to the  hospital emergency room.  If you develope a fever above 101 F, pus (white drainage) or increased drainage or redness at the wound, or calf pain, call your surgeon's office.     Change dressing    Complete by:  As directed   Maintain surgical dressing until follow up in the clinic. If the edges start to pull up, may reinforce with tape. If the dressing is no longer working, may remove and cover with gauze and tape, but must keep the area dry and clean.  Call with any questions or concerns.     Constipation Prevention    Complete by:  As directed   Drink plenty of fluids.  Prune juice may be helpful.  You may use a stool softener, such as Colace (over the counter) 100 mg twice a day.  Use MiraLax (over the counter) for constipation as needed.     Diet - low sodium heart healthy    Complete by:  As directed      Discharge instructions    Complete by:  As directed   Maintain surgical dressing until follow up in the clinic. If the edges start to pull up, may reinforce with tape. If the dressing is no longer working, may remove and cover with gauze and tape, but must keep the area dry and clean.  [facility]. Call with any questions or concerns.     Partial weight bearing    Complete by:  As directed   % Body Weight:  50  Laterality:  right  Extremity:  Lower     TED hose    Complete by:  As directed   Use stockings (TED hose) for 2 weeks on both leg(s).  You may remove them at night for sleeping.             Medication List    STOP taking these medications        cefadroxil 500 MG capsule  Commonly known as:  DURICEF     cephALEXin 500 MG capsule  Commonly known as:  KEFLEX     diclofenac 75 MG EC tablet  Commonly known as:  VOLTAREN     traMADol 50 MG tablet  Commonly known as:  ULTRAM      TAKE these medications        acetaminophen 325 MG tablet  Commonly known as:  TYLENOL  Take 2 tablets (650 mg total) by mouth every 6 (six) hours as  needed for mild pain (or Fever >/= 101).     aspirin 325 MG EC tablet  Take 1 tablet (325 mg total) by mouth 2 (two) times daily.     ceFAZolin 2-4 GM/100ML-% IVPB  Commonly known as:  ANCEF  Inject 100 mLs (2 g total) into the vein  every 8 (eight) hours.     celecoxib 200 MG capsule  Commonly known as:  CELEBREX  Take 1 capsule (200 mg total) by mouth every 12 (twelve) hours.     diazepam 5 MG tablet  Commonly known as:  VALIUM  Take 1 tablet (5 mg total) by mouth every 6 (six) hours as needed for muscle spasms.     docusate sodium 100 MG capsule  Commonly known as:  COLACE  Take 1 capsule (100 mg total) by mouth 2 (two) times daily.     ferrous sulfate 325 (65 FE) MG tablet  Take 1 tablet (325 mg total) by mouth 3 (three) times daily after meals.     finasteride 5 MG tablet  Commonly known as:  PROSCAR  Take 5 mg by mouth daily.     Insulin Glargine 100 UNIT/ML Solostar Pen  Commonly known as:  LANTUS SOLOSTAR  Inject 55 Units into the skin daily. Per sliding scale     levothyroxine 88 MCG tablet  Commonly known as:  SYNTHROID, LEVOTHROID  Take 88 mcg by mouth daily before breakfast.     lisinopril-hydrochlorothiazide 20-25 MG tablet  Commonly known as:  PRINZIDE,ZESTORETIC  Take 1 tablet by mouth every morning.     MAGNESIUM PO  Take 1 tablet by mouth daily.     MULTIVITAMIN PO  Take 1 tablet by mouth daily.     NOVOLOG FLEXPEN 100 UNIT/ML FlexPen  Generic drug:  insulin aspart  Inject 10-15 Units into the skin 3 (three) times daily with meals. Sliding Scale Patient does not use a sliding scale per patient     oxyCODONE 5 MG immediate release tablet  Commonly known as:  Oxy IR/ROXICODONE  Take 1-3 tablets (5-15 mg total) by mouth every 4 (four) hours as needed for severe pain.     polyethylene glycol packet  Commonly known as:  MIRALAX / GLYCOLAX  Take 17 g by mouth 2 (two) times daily.     Potassium 99 MG Tabs  Take 1 tablet by mouth 2 (two) times daily.      pregabalin 150 MG capsule  Commonly known as:  LYRICA  Take 225 mg by mouth 2 (two) times daily.     RAPAFLO 8 MG Caps capsule  Generic drug:  silodosin  Take 8 mg by mouth daily with breakfast.     simvastatin 20 MG tablet  Commonly known as:  ZOCOR  Take 20 mg by mouth daily.     tiZANidine 4 MG tablet  Commonly known as:  ZANAFLEX  Take 1 tablet (4 mg total) by mouth every 6 (six) hours as needed for muscle spasms.     VITAMIN B 12 PO  Take 1 tablet by mouth daily.     VITAMIN D PO  Take 1 tablet by mouth daily.         Signed: Anastasio Auerbach.    PA-C  03/13/2016, 10:08 AM

## 2016-03-13 NOTE — Telephone Encounter (Signed)
Patient called and stated he has a rash all over his groin and buttocks that started two days ago. He is scheduled for a hospital follow up with Dr. Luciana Axeomer on 04/06/16. Called [facility] at [phone], ext [phone] and they will page Dr. Luciana Axeomer for instructions. Called patient back and he is aware that his home health nurse has been notified. Wendall MolaJacqueline Cockerham

## 2016-03-13 NOTE — Telephone Encounter (Signed)
-----   Message from Gardiner Barefootobert W Comer, MD sent at 03/13/2016  3:31 PM EDT ----- Could you let [facility] know to change his Ancef to vancomycin per protocol.  He has a rash to Ancef now.  thanks

## 2016-03-13 NOTE — Telephone Encounter (Signed)
Relayed verbal order to Continental AirlinesPatty, Pharmacist [facility].   Andree CossHowell,  M, RN

## 2016-03-14 DIAGNOSIS — Z794 Long term (current) use of insulin: Secondary | ICD-10-CM | POA: Diagnosis not present

## 2016-03-14 DIAGNOSIS — Z452 Encounter for adjustment and management of vascular access device: Secondary | ICD-10-CM | POA: Diagnosis not present

## 2016-03-14 DIAGNOSIS — Z96651 Presence of right artificial knee joint: Secondary | ICD-10-CM | POA: Diagnosis not present

## 2016-03-14 DIAGNOSIS — E119 Type 2 diabetes mellitus without complications: Secondary | ICD-10-CM | POA: Diagnosis not present

## 2016-03-14 DIAGNOSIS — R2689 Other abnormalities of gait and mobility: Secondary | ICD-10-CM | POA: Diagnosis not present

## 2016-03-14 DIAGNOSIS — T8453XA Infection and inflammatory reaction due to internal right knee prosthesis, initial encounter: Secondary | ICD-10-CM | POA: Diagnosis not present

## 2016-03-17 DIAGNOSIS — R2689 Other abnormalities of gait and mobility: Secondary | ICD-10-CM | POA: Diagnosis not present

## 2016-03-17 DIAGNOSIS — Z794 Long term (current) use of insulin: Secondary | ICD-10-CM | POA: Diagnosis not present

## 2016-03-17 DIAGNOSIS — T8453XA Infection and inflammatory reaction due to internal right knee prosthesis, initial encounter: Secondary | ICD-10-CM | POA: Diagnosis not present

## 2016-03-17 DIAGNOSIS — Z452 Encounter for adjustment and management of vascular access device: Secondary | ICD-10-CM | POA: Diagnosis not present

## 2016-03-17 DIAGNOSIS — E119 Type 2 diabetes mellitus without complications: Secondary | ICD-10-CM | POA: Diagnosis not present

## 2016-03-17 DIAGNOSIS — Z96651 Presence of right artificial knee joint: Secondary | ICD-10-CM | POA: Diagnosis not present

## 2016-03-21 DIAGNOSIS — E119 Type 2 diabetes mellitus without complications: Secondary | ICD-10-CM | POA: Diagnosis not present

## 2016-03-21 DIAGNOSIS — Z452 Encounter for adjustment and management of vascular access device: Secondary | ICD-10-CM | POA: Diagnosis not present

## 2016-03-21 DIAGNOSIS — Z794 Long term (current) use of insulin: Secondary | ICD-10-CM | POA: Diagnosis not present

## 2016-03-21 DIAGNOSIS — Z96651 Presence of right artificial knee joint: Secondary | ICD-10-CM | POA: Diagnosis not present

## 2016-03-21 DIAGNOSIS — T8453XA Infection and inflammatory reaction due to internal right knee prosthesis, initial encounter: Secondary | ICD-10-CM | POA: Diagnosis not present

## 2016-03-21 DIAGNOSIS — R2689 Other abnormalities of gait and mobility: Secondary | ICD-10-CM | POA: Diagnosis not present

## 2016-03-23 DIAGNOSIS — Z96651 Presence of right artificial knee joint: Secondary | ICD-10-CM | POA: Diagnosis not present

## 2016-03-23 DIAGNOSIS — T8453XA Infection and inflammatory reaction due to internal right knee prosthesis, initial encounter: Secondary | ICD-10-CM | POA: Diagnosis not present

## 2016-03-23 DIAGNOSIS — R2689 Other abnormalities of gait and mobility: Secondary | ICD-10-CM | POA: Diagnosis not present

## 2016-03-23 DIAGNOSIS — E119 Type 2 diabetes mellitus without complications: Secondary | ICD-10-CM | POA: Diagnosis not present

## 2016-03-23 DIAGNOSIS — Z452 Encounter for adjustment and management of vascular access device: Secondary | ICD-10-CM | POA: Diagnosis not present

## 2016-03-23 DIAGNOSIS — Z794 Long term (current) use of insulin: Secondary | ICD-10-CM | POA: Diagnosis not present

## 2016-03-24 DIAGNOSIS — T8453XD Infection and inflammatory reaction due to internal right knee prosthesis, subsequent encounter: Secondary | ICD-10-CM | POA: Diagnosis not present

## 2016-03-27 DIAGNOSIS — R2689 Other abnormalities of gait and mobility: Secondary | ICD-10-CM | POA: Diagnosis not present

## 2016-03-27 DIAGNOSIS — Z452 Encounter for adjustment and management of vascular access device: Secondary | ICD-10-CM | POA: Diagnosis not present

## 2016-03-27 DIAGNOSIS — Z794 Long term (current) use of insulin: Secondary | ICD-10-CM | POA: Diagnosis not present

## 2016-03-27 DIAGNOSIS — Z96651 Presence of right artificial knee joint: Secondary | ICD-10-CM | POA: Diagnosis not present

## 2016-03-27 DIAGNOSIS — T8453XA Infection and inflammatory reaction due to internal right knee prosthesis, initial encounter: Secondary | ICD-10-CM | POA: Diagnosis not present

## 2016-03-27 DIAGNOSIS — E119 Type 2 diabetes mellitus without complications: Secondary | ICD-10-CM | POA: Diagnosis not present

## 2016-04-03 DIAGNOSIS — R2689 Other abnormalities of gait and mobility: Secondary | ICD-10-CM | POA: Diagnosis not present

## 2016-04-03 DIAGNOSIS — Z96651 Presence of right artificial knee joint: Secondary | ICD-10-CM | POA: Diagnosis not present

## 2016-04-03 DIAGNOSIS — E119 Type 2 diabetes mellitus without complications: Secondary | ICD-10-CM | POA: Diagnosis not present

## 2016-04-03 DIAGNOSIS — Z452 Encounter for adjustment and management of vascular access device: Secondary | ICD-10-CM | POA: Diagnosis not present

## 2016-04-03 DIAGNOSIS — T8453XA Infection and inflammatory reaction due to internal right knee prosthesis, initial encounter: Secondary | ICD-10-CM | POA: Diagnosis not present

## 2016-04-03 DIAGNOSIS — Z794 Long term (current) use of insulin: Secondary | ICD-10-CM | POA: Diagnosis not present

## 2016-04-04 DIAGNOSIS — T8453XA Infection and inflammatory reaction due to internal right knee prosthesis, initial encounter: Secondary | ICD-10-CM | POA: Diagnosis not present

## 2016-04-04 DIAGNOSIS — Z45018 Encounter for adjustment and management of other part of cardiac pacemaker: Secondary | ICD-10-CM | POA: Diagnosis not present

## 2016-04-04 DIAGNOSIS — Z96651 Presence of right artificial knee joint: Secondary | ICD-10-CM | POA: Diagnosis not present

## 2016-04-05 DIAGNOSIS — Z79899 Other long term (current) drug therapy: Secondary | ICD-10-CM | POA: Diagnosis not present

## 2016-04-05 DIAGNOSIS — Z794 Long term (current) use of insulin: Secondary | ICD-10-CM | POA: Diagnosis not present

## 2016-04-05 DIAGNOSIS — E162 Hypoglycemia, unspecified: Secondary | ICD-10-CM | POA: Diagnosis not present

## 2016-04-05 DIAGNOSIS — E1165 Type 2 diabetes mellitus with hyperglycemia: Secondary | ICD-10-CM | POA: Diagnosis not present

## 2016-04-06 ENCOUNTER — Telehealth: Payer: Self-pay

## 2016-04-06 ENCOUNTER — Ambulatory Visit (INDEPENDENT_AMBULATORY_CARE_PROVIDER_SITE_OTHER): Payer: Medicare Other | Admitting: Internal Medicine

## 2016-04-06 ENCOUNTER — Encounter: Payer: Self-pay | Admitting: Internal Medicine

## 2016-04-06 VITALS — BP 95/60 | HR 71 | Temp 97.7°F | Wt 187.8 lb

## 2016-04-06 DIAGNOSIS — E1100 Type 2 diabetes mellitus with hyperosmolarity without nonketotic hyperglycemic-hyperosmolar coma (NKHHC): Secondary | ICD-10-CM

## 2016-04-06 DIAGNOSIS — D702 Other drug-induced agranulocytosis: Secondary | ICD-10-CM | POA: Diagnosis not present

## 2016-04-06 DIAGNOSIS — T8450XD Infection and inflammatory reaction due to unspecified internal joint prosthesis, subsequent encounter: Secondary | ICD-10-CM | POA: Diagnosis present

## 2016-04-06 DIAGNOSIS — T8459XD Infection and inflammatory reaction due to other internal joint prosthesis, subsequent encounter: Secondary | ICD-10-CM

## 2016-04-06 DIAGNOSIS — Z96659 Presence of unspecified artificial knee joint: Principal | ICD-10-CM

## 2016-04-06 NOTE — Assessment & Plan Note (Signed)
Needs good sugar control. Last A1C 10.

## 2016-04-06 NOTE — Assessment & Plan Note (Addendum)
I am not sure what to make of a recent rise of CRP, though ESR has remained stable.  I am going to check again tomorrow to compare.  If it is stable or better and ESR remains good, I do not think surgery needs to be delayed or antibiotics extended.  Will though extend to 5/28 due to the change in antibiotics.

## 2016-04-06 NOTE — Assessment & Plan Note (Signed)
I will confirm this with a repeat sample tomorrow and will need to stop vancomycin and use daptomycin instead.

## 2016-04-06 NOTE — Telephone Encounter (Signed)
Called [facility] and spoke with Coretta to give Dr. Ephriam Knucklesomer's verbal order to draw blood today (5/18) and obtain a comprehensive metabolic panel, vancomycin trough, sed rate, C-reactive protein and a CBC with differential. Rejeana Brockandace , LPN

## 2016-04-06 NOTE — Progress Notes (Signed)
   Subjective:    Patient ID: HAWKE VILLALPANDO, male    DOB: Mar 30, 1941, 75 y.o.   MRN: [id number]  HPI Here for follow up of PJI, s/p 2 stage revision.   David Mathews is a 75 y.o. male with history of arthritis with history of bilateral knee replacements in 1103, without complications. On May 20 2015, he also underwent spinal fusion procedure by Dr. Hal Neer. Two weeks later, he began feeling pain in his right knee which became progressively swollen. He denied fever during that time.   He underwent irrigation and debridement in the OR for both his right knee (polyexchange) and spine last July. Intra-op gram stain of the knee revealed MSSA and he was treated for 6 weeks with IV cefazolin. He was the transitioned to oral continuation therapy with Augmentin, but developed a rash and changed to an oral cephalosporin. He continued on this but noted swelling of knee and twisting. He was then seen by Dr. Alvan Dame and taken to OR for resection arthroplasty on 4/10 with spacer placement. Cultures did not grow anything.  He was put back on cefazolin for 6 weeks through 5/22 but on 4/24 developed a rash to cefazolin and changed to vancomycin.  He has tolerated this well.  Initial CRP 2.6 and ESR 9, though recent CRPs have been around 50, though ESR has remained stable.  Also now with leukopenia of 1.2 when it was previously normal.  Hoping to make his trip to [place] for work.  Scheduled for replacement June 12th if everything is going well. No problems with the knee.  Dr. Alvan Dame by his report feels it is good.    Review of Systems  Constitutional: Negative for fever, chills and fatigue.  Gastrointestinal: Negative for nausea and diarrhea.  Endocrine: Negative for polyuria.  Skin: Negative for rash.  Neurological: Negative for dizziness.       Objective:   Physical Exam  Constitutional: He appears well-developed and well-nourished. No distress.  Eyes: No scleral icterus.  Musculoskeletal: He  exhibits no edema.  Right knee with well-healed incision, some mild edema but no erythema.    Skin: No rash noted.   Social History   Social History  . Marital Status: Widowed    Spouse Name: N/A  . Number of Children: N/A  . Years of Education: N/A   Occupational History  . Not on file.   Social History Main Topics  . Smoking status: Former Smoker -- 1.00 packs/day for 10 years    Types: Cigarettes    Quit date: 05/17/1999  . Smokeless tobacco: Never Used  . Alcohol Use: No     Comment: occasional glass of wine   . Drug Use: No  . Sexual Activity: Not on file   Other Topics Concern  . Not on file   Social History Narrative         Assessment & Plan:

## 2016-04-06 NOTE — Telephone Encounter (Addendum)
David Mathews with [facility] [phone]. Called to see if it was ok to draw labs on this patient tomorrow 04/07/16, instead of today. Per Dr. Luciana Axeomer this is ok and please fax the results to this clinic by Monday 04/10/16. Lyndsey agreed and has the correct fax #. Wendall MolaJacqueline 

## 2016-04-07 ENCOUNTER — Other Ambulatory Visit
Admission: RE | Admit: 2016-04-07 | Discharge: 2016-04-07 | Disposition: A | Discharge status: Home / Self Care | Payer: Medicare Other | Source: Ambulatory Visit | Attending: Orthopedic Surgery | Admitting: Orthopedic Surgery

## 2016-04-07 DIAGNOSIS — R2689 Other abnormalities of gait and mobility: Secondary | ICD-10-CM | POA: Diagnosis not present

## 2016-04-07 DIAGNOSIS — T8453XA Infection and inflammatory reaction due to internal right knee prosthesis, initial encounter: Secondary | ICD-10-CM | POA: Diagnosis not present

## 2016-04-07 DIAGNOSIS — Z452 Encounter for adjustment and management of vascular access device: Secondary | ICD-10-CM | POA: Diagnosis not present

## 2016-04-07 DIAGNOSIS — Z96651 Presence of right artificial knee joint: Secondary | ICD-10-CM | POA: Diagnosis not present

## 2016-04-07 DIAGNOSIS — Z794 Long term (current) use of insulin: Secondary | ICD-10-CM | POA: Diagnosis not present

## 2016-04-07 DIAGNOSIS — E119 Type 2 diabetes mellitus without complications: Secondary | ICD-10-CM | POA: Diagnosis not present

## 2016-04-07 DIAGNOSIS — R269 Unspecified abnormalities of gait and mobility: Secondary | ICD-10-CM | POA: Diagnosis not present

## 2016-04-07 LAB — SEDIMENTATION RATE: SED RATE: 30 mm/h — AB (ref 0–20)

## 2016-04-10 DIAGNOSIS — Z794 Long term (current) use of insulin: Secondary | ICD-10-CM | POA: Diagnosis not present

## 2016-04-10 DIAGNOSIS — T8453XA Infection and inflammatory reaction due to internal right knee prosthesis, initial encounter: Secondary | ICD-10-CM | POA: Diagnosis not present

## 2016-04-10 DIAGNOSIS — Z96651 Presence of right artificial knee joint: Secondary | ICD-10-CM | POA: Diagnosis not present

## 2016-04-10 DIAGNOSIS — E119 Type 2 diabetes mellitus without complications: Secondary | ICD-10-CM | POA: Diagnosis not present

## 2016-04-10 DIAGNOSIS — Z452 Encounter for adjustment and management of vascular access device: Secondary | ICD-10-CM | POA: Diagnosis not present

## 2016-04-10 DIAGNOSIS — T8453XD Infection and inflammatory reaction due to internal right knee prosthesis, subsequent encounter: Secondary | ICD-10-CM | POA: Diagnosis not present

## 2016-04-10 DIAGNOSIS — R2689 Other abnormalities of gait and mobility: Secondary | ICD-10-CM | POA: Diagnosis not present

## 2016-04-11 ENCOUNTER — Telehealth: Payer: Self-pay

## 2016-04-11 DIAGNOSIS — Z452 Encounter for adjustment and management of vascular access device: Secondary | ICD-10-CM | POA: Diagnosis not present

## 2016-04-11 DIAGNOSIS — T8453XA Infection and inflammatory reaction due to internal right knee prosthesis, initial encounter: Secondary | ICD-10-CM | POA: Diagnosis not present

## 2016-04-11 DIAGNOSIS — R2689 Other abnormalities of gait and mobility: Secondary | ICD-10-CM | POA: Diagnosis not present

## 2016-04-11 DIAGNOSIS — Z794 Long term (current) use of insulin: Secondary | ICD-10-CM | POA: Diagnosis not present

## 2016-04-11 DIAGNOSIS — E119 Type 2 diabetes mellitus without complications: Secondary | ICD-10-CM | POA: Diagnosis not present

## 2016-04-11 DIAGNOSIS — Z96651 Presence of right artificial knee joint: Secondary | ICD-10-CM | POA: Diagnosis not present

## 2016-04-11 NOTE — Telephone Encounter (Signed)
Called [facility] [place] and spoke with Rush FarmerZida Llara and gave Dr. Ephriam Knucklesomer's orders to continue vancomycin until 05/28. Rejeana Brockandace Murray, LPN

## 2016-04-17 DIAGNOSIS — Z452 Encounter for adjustment and management of vascular access device: Secondary | ICD-10-CM | POA: Diagnosis not present

## 2016-04-17 DIAGNOSIS — T8453XA Infection and inflammatory reaction due to internal right knee prosthesis, initial encounter: Secondary | ICD-10-CM | POA: Diagnosis not present

## 2016-04-17 DIAGNOSIS — Z794 Long term (current) use of insulin: Secondary | ICD-10-CM | POA: Diagnosis not present

## 2016-04-17 DIAGNOSIS — R2689 Other abnormalities of gait and mobility: Secondary | ICD-10-CM | POA: Diagnosis not present

## 2016-04-17 DIAGNOSIS — E119 Type 2 diabetes mellitus without complications: Secondary | ICD-10-CM | POA: Diagnosis not present

## 2016-04-17 DIAGNOSIS — Z96651 Presence of right artificial knee joint: Secondary | ICD-10-CM | POA: Diagnosis not present

## 2016-04-19 ENCOUNTER — Other Ambulatory Visit (HOSPITAL_COMMUNITY): Payer: Self-pay | Admitting: *Deleted

## 2016-04-19 ENCOUNTER — Telehealth: Payer: Self-pay | Admitting: *Deleted

## 2016-04-19 DIAGNOSIS — Z452 Encounter for adjustment and management of vascular access device: Secondary | ICD-10-CM | POA: Diagnosis not present

## 2016-04-19 DIAGNOSIS — Z96651 Presence of right artificial knee joint: Secondary | ICD-10-CM | POA: Diagnosis not present

## 2016-04-19 DIAGNOSIS — R2689 Other abnormalities of gait and mobility: Secondary | ICD-10-CM | POA: Diagnosis not present

## 2016-04-19 DIAGNOSIS — Z794 Long term (current) use of insulin: Secondary | ICD-10-CM | POA: Diagnosis not present

## 2016-04-19 DIAGNOSIS — E119 Type 2 diabetes mellitus without complications: Secondary | ICD-10-CM | POA: Diagnosis not present

## 2016-04-19 DIAGNOSIS — T8453XA Infection and inflammatory reaction due to internal right knee prosthesis, initial encounter: Secondary | ICD-10-CM | POA: Diagnosis not present

## 2016-04-19 NOTE — Patient Instructions (Addendum)
David Mathews  04/19/2016   Your procedure is scheduled on: 05-01-16  [facility]  [facility]  [facility]vators to 3rd floor to  Short Stay Center at  830 AM.  Call this number if you have problems the morning of surgery [phone]   Remember: ONLY 1 PERSON MAY GO WITH YOU TO SHORT STAY TO GET  READY MORNING OF YOUR SURGERY.  Do not eat food or drink liquids :After Midnight.     Take these medicines the morning of surgery with A SIP OF WATER: DIAZEPAM (VALIUM ) IF NEEDED, FLONASE NASAL SPRAY IF NEEDED, LEVOTHYROXINE (SYNTHROID), LORATADINE, OXYCODONE IR IF NEEDED, SULODOSIN (RAPAFLO), LYRICA DO NOT TAKE ANY DIABETIC MEDICATIONS DAY OF YOUR SURGERY                               You may not have any metal on your body including hair pins and              piercings  Do not wear jewelry, make-up, lotions, powders or perfumes, deodorant             Do not wear nail polish.  Do not shave  48 hours prior to surgery.              Men may shave face and neck.   Do not bring valuables to the hospital. [facility] IS NOT             RESPONSIBLE   FOR VALUABLES.  Contacts, dentures or bridgework may not be worn into surgery.  Leave suitcase in the car. After surgery it may be brought to your room.                  Please read over the following fact sheets you were given: _____________________________________________________________________             [facility] - Preparing for [facility] Before surgery, you can play an important role.  Because skin is not sterile, your skin needs to be as free of germs as possible.  You can reduce the number of germs on your skin by washing with CHG (chlorahexidine gluconate) soap before surgery.  CHG is an antiseptic cleaner which kills germs and bonds with the skin to continue killing germs even after washing. Please DO NOT use if you have an allergy to CHG or antibacterial soaps.  If your skin becomes  reddened/irritated stop using the CHG and inform your nurse when you arrive at Short Stay. Do not shave (including legs and underarms) for at least 48 hours prior to the first CHG shower.  You may shave your face/neck. Please follow these instructions carefully:  1.  Shower with CHG Soap the night before surgery and the  morning of Surgery.  2.  If you choose to wash your hair, wash your hair first as usual with your  normal  shampoo.  3.  After you shampoo, rinse your hair and body thoroughly to remove the  shampoo.                           4.  Use CHG as you would any other liquid soap.  You can apply chg directly  to the skin and wash  Gently with a scrungie or clean washcloth.  5.  Apply the CHG Soap to your body ONLY FROM THE NECK DOWN.   Do not use on face/ open                           Wound or open sores. Avoid contact with eyes, ears mouth and genitals (private parts).                       Wash face,  Genitals (private parts) with your normal soap.             6.  Wash thoroughly, paying special attention to the area where your surgery  will be performed.  7.  Thoroughly rinse your body with warm water from the neck down.  8.  DO NOT shower/wash with your normal soap after using and rinsing off  the CHG Soap.                9.  Pat yourself dry with a clean towel.            10.  Wear clean pajamas.            11.  Place clean sheets on your bed the night of your first shower and do not  sleep with pets. Day of Surgery : Do not apply any lotions/deodorants the morning of surgery.  Please wear clean clothes to the hospital/surgery center.  FAILURE TO FOLLOW THESE INSTRUCTIONS MAY RESULT IN THE CANCELLATION OF YOUR SURGERY PATIENT SIGNATURE_________________________________  NURSE SIGNATURE__________________________________  ________________________________________________________________________   Adam Phenix  An incentive spirometer is a tool that  can help keep your lungs clear and active. This tool measures how well you are filling your lungs with each breath. Taking long deep breaths may help reverse or decrease the chance of developing breathing (pulmonary) problems (especially infection) following:  A long period of time when you are unable to move or be active. BEFORE THE PROCEDURE   If the spirometer includes an indicator to show your best effort, your nurse or respiratory therapist will set it to a desired goal.  If possible, sit up straight or lean slightly forward. Try not to slouch.  Hold the incentive spirometer in an upright position. INSTRUCTIONS FOR USE   Sit on the edge of your bed if possible, or sit up as far as you can in bed or on a chair.  Hold the incentive spirometer in an upright position.  Breathe out normally.  Place the mouthpiece in your mouth and seal your lips tightly around it.  Breathe in slowly and as deeply as possible, raising the piston or the ball toward the top of the column.  Hold your breath for 3-5 seconds or for as long as possible. Allow the piston or ball to fall to the bottom of the column.  Remove the mouthpiece from your mouth and breathe out normally.  Rest for a few seconds and repeat Steps 1 through 7 at least 10 times every 1-2 hours when you are awake. Take your time and take a few normal breaths between deep breaths.  The spirometer may include an indicator to show your best effort. Use the indicator as a goal to work toward during each repetition.  After each set of 10 deep breaths, practice coughing to be sure your lungs are clear. If you have an incision (the cut made at the time of surgery),  support your incision when coughing by placing a pillow or rolled up towels firmly against it. Once you are able to get out of bed, walk around indoors and cough well. You may stop using the incentive spirometer when instructed by your caregiver.  RISKS AND COMPLICATIONS  Take your  time so you do not get dizzy or light-headed.  If you are in pain, you may need to take or ask for pain medication before doing incentive spirometry. It is harder to take a deep breath if you are having pain. AFTER USE  Rest and breathe slowly and easily.  It can be helpful to keep track of a log of your progress. Your caregiver can provide you with a simple table to help with this. If you are using the spirometer at home, follow these instructions: [place] IF:   You are having difficultly using the spirometer.  You have trouble using the spirometer as often as instructed.  Your pain medication is not giving enough relief while using the spirometer.  You develop fever of 100.5 F (38.1 C) or higher. SEEK IMMEDIATE MEDICAL CARE IF:   You cough up bloody sputum that had not been present before.  You develop fever of 102 F (38.9 C) or greater.  You develop worsening pain at or near the incision site. MAKE SURE YOU:   Understand these instructions.  Will watch your condition.  Will get help right away if you are not doing well or get worse. Document Released: 03/19/2007 Document Revised: 01/29/2012 Document Reviewed: 05/20/2007 ExitCare Patient Information 2014 ExitCare, [place].   ________________________________________________________________________  WHAT IS A BLOOD TRANSFUSION? Blood Transfusion Information  A transfusion is the replacement of blood or some of its parts. Blood is made up of multiple cells which provide different functions.  Red blood cells carry oxygen and are used for blood loss replacement.  White blood cells fight against infection.  Platelets control bleeding.  Plasma helps clot blood.  Other blood products are available for specialized needs, such as hemophilia or other clotting disorders. BEFORE THE TRANSFUSION  Who gives blood for transfusions?   Healthy volunteers who are fully evaluated to make sure their blood is safe. This  is blood bank blood. Transfusion therapy is the safest it has ever been in the practice of medicine. Before blood is taken from a donor, a complete history is taken to make sure that person has no history of diseases nor engages in risky social behavior (examples are intravenous drug use or sexual activity with multiple partners). The donor's travel history is screened to minimize risk of transmitting infections, such as malaria. The donated blood is tested for signs of infectious diseases, such as HIV and hepatitis. The blood is then tested to be sure it is compatible with you in order to minimize the chance of a transfusion reaction. If you or a relative donates blood, this is often done in anticipation of surgery and is not appropriate for emergency situations. It takes many days to process the donated blood. RISKS AND COMPLICATIONS Although transfusion therapy is very safe and saves many lives, the main dangers of transfusion include:   Getting an infectious disease.  Developing a transfusion reaction. This is an allergic reaction to something in the blood you were given. Every precaution is taken to prevent this. The decision to have a blood transfusion has been considered carefully by your caregiver before blood is given. Blood is not given unless the benefits outweigh the risks. AFTER THE TRANSFUSION  Right after receiving a blood transfusion, you will usually feel much better and more energetic. This is especially true if your red blood cells have gotten low (anemic). The transfusion raises the level of the red blood cells which carry oxygen, and this usually causes an energy increase.  The nurse administering the transfusion will monitor you carefully for complications. HOME CARE INSTRUCTIONS  No special instructions are needed after a transfusion. You may find your energy is better. Speak with your caregiver about any limitations on activity for underlying diseases you may have. SEEK  MEDICAL CARE IF:   Your condition is not improving after your transfusion.  You develop redness or irritation at the intravenous (IV) site. SEEK IMMEDIATE MEDICAL CARE IF:  Any of the following symptoms occur over the next 12 hours:  Shaking chills.  You have a temperature by mouth above 102 F (38.9 C), not controlled by medicine.  Chest, back, or muscle pain.  People around you feel you are not acting correctly or are confused.  Shortness of breath or difficulty breathing.  Dizziness and fainting.  You get a rash or develop hives.  You have a decrease in urine output.  Your urine turns a dark color or changes to pink, red, or brown. Any of the following symptoms occur over the next 10 days:  You have a temperature by mouth above 102 F (38.9 C), not controlled by medicine.  Shortness of breath.  Weakness after normal activity.  The white part of the eye turns yellow (jaundice).  You have a decrease in the amount of urine or are urinating less often.  Your urine turns a dark color or changes to pink, red, or brown. Document Released: 11/03/2000 Document Revised: 01/29/2012 Document Reviewed: 06/22/2008 [facility] Patient Information 2014 [place], [place].  _______________________________________________________________________

## 2016-04-19 NOTE — Telephone Encounter (Signed)
-----   Message from Gardiner Barefootobert W Comer, MD sent at 04/19/2016  8:49 AM EDT ----- Ok to stop antibiotics and pull picc line.  thanks

## 2016-04-19 NOTE — Telephone Encounter (Signed)
Verbal order per Dr. Luciana Axeomer [facility].   Andree CossHowell,  M, RN

## 2016-04-19 NOTE — Progress Notes (Signed)
ECHO 06-18-15 EPIC EKG 06-14-15 EPIC  SED RATE 04-07-16 EPIC

## 2016-04-20 ENCOUNTER — Encounter (HOSPITAL_COMMUNITY)
Admission: RE | Admit: 2016-04-20 | Discharge: 2016-04-20 | Disposition: A | Discharge status: Home / Self Care | Payer: Medicare Other | Source: Ambulatory Visit | Attending: Orthopedic Surgery | Admitting: Orthopedic Surgery

## 2016-04-20 ENCOUNTER — Encounter (HOSPITAL_COMMUNITY): Payer: Self-pay

## 2016-04-20 ENCOUNTER — Telehealth: Payer: Self-pay | Admitting: *Deleted

## 2016-04-20 DIAGNOSIS — Z79899 Other long term (current) drug therapy: Secondary | ICD-10-CM | POA: Diagnosis not present

## 2016-04-20 DIAGNOSIS — Z01812 Encounter for preprocedural laboratory examination: Secondary | ICD-10-CM | POA: Diagnosis not present

## 2016-04-20 DIAGNOSIS — Z794 Long term (current) use of insulin: Secondary | ICD-10-CM | POA: Diagnosis not present

## 2016-04-20 DIAGNOSIS — E039 Hypothyroidism, unspecified: Secondary | ICD-10-CM | POA: Diagnosis not present

## 2016-04-20 DIAGNOSIS — Z0183 Encounter for blood typing: Secondary | ICD-10-CM | POA: Insufficient documentation

## 2016-04-20 DIAGNOSIS — E1140 Type 2 diabetes mellitus with diabetic neuropathy, unspecified: Secondary | ICD-10-CM | POA: Insufficient documentation

## 2016-04-20 DIAGNOSIS — Z87891 Personal history of nicotine dependence: Secondary | ICD-10-CM | POA: Insufficient documentation

## 2016-04-20 HISTORY — DX: Benign prostatic hyperplasia without lower urinary tract symptoms: N40.0

## 2016-04-20 LAB — SURGICAL PCR SCREEN
MRSA, PCR: NEGATIVE
STAPHYLOCOCCUS AUREUS: NEGATIVE

## 2016-04-20 LAB — BASIC METABOLIC PANEL
ANION GAP: 8 (ref 5–15)
BUN: 17 mg/dL (ref 6–20)
CHLORIDE: 91 mmol/L — AB (ref 101–111)
CO2: 30 mmol/L (ref 22–32)
CREATININE: 0.84 mg/dL (ref 0.61–1.24)
Calcium: 8.9 mg/dL (ref 8.9–10.3)
GFR calc non Af Amer: >60 mL/min (ref >60)
Glucose, Bld: 184 mg/dL — ABNORMAL HIGH (ref 65–99)
Potassium: 4.1 mmol/L (ref 3.5–5.1)
SODIUM: 129 mmol/L — AB (ref 135–145)

## 2016-04-20 LAB — CBC
HCT: 34 % — ABNORMAL LOW (ref 39.0–52.0)
HEMOGLOBIN: 11.4 g/dL — AB (ref 13.0–17.0)
MCH: 28.3 pg (ref 26.0–34.0)
MCHC: 33.5 g/dL (ref 30.0–36.0)
MCV: 84.4 fL (ref 78.0–100.0)
PLATELETS: 218 10*3/uL (ref 150–400)
RBC: 4.03 MIL/uL — AB (ref 4.22–5.81)
RDW: 13 % (ref 11.5–15.5)
WBC: 6.2 10*3/uL (ref 4.0–10.5)

## 2016-04-20 LAB — TYPE AND SCREEN
ABO/RH(D): B POS
ANTIBODY SCREEN: NEGATIVE

## 2016-04-20 NOTE — Telephone Encounter (Signed)
Labs received and faxed to Dr Charlann Boxerlin office [phone]

## 2016-04-21 ENCOUNTER — Telehealth: Payer: Self-pay

## 2016-04-21 LAB — HEMOGLOBIN A1C
Hgb A1c MFr Bld: 8.7 % — ABNORMAL HIGH (ref 4.8–5.6)
Mean Plasma Glucose: 203 mg/dL

## 2016-04-21 NOTE — Progress Notes (Signed)
LEFT MESSAGE WITH SHERRY WILLS ABOUT HEMAGLOBIN A1C RESULTS FAXED TO DR Charlann BoxerLIN.

## 2016-04-21 NOTE — Telephone Encounter (Signed)
Patient called wanting results of blood work drawn on 06/01 because he wanted to know if he needed another PICC line. He stated that he does not want another PICC line because he wanted to have surgery. Explained to patient that Dr. Luciana Axeomer did not have blood work drawn and he will have to notify the providers that actually ordered the labs. Patient stated understanding.  ,LPN

## 2016-04-21 NOTE — Progress Notes (Signed)
bmet and hemaglobin A1C results faxed to dr Charlann Boxerolin by epic

## 2016-04-22 DIAGNOSIS — Z452 Encounter for adjustment and management of vascular access device: Secondary | ICD-10-CM | POA: Diagnosis not present

## 2016-04-22 DIAGNOSIS — Z96651 Presence of right artificial knee joint: Secondary | ICD-10-CM | POA: Diagnosis not present

## 2016-04-22 DIAGNOSIS — R2689 Other abnormalities of gait and mobility: Secondary | ICD-10-CM | POA: Diagnosis not present

## 2016-04-22 DIAGNOSIS — E119 Type 2 diabetes mellitus without complications: Secondary | ICD-10-CM | POA: Diagnosis not present

## 2016-04-22 DIAGNOSIS — T8453XA Infection and inflammatory reaction due to internal right knee prosthesis, initial encounter: Secondary | ICD-10-CM | POA: Diagnosis not present

## 2016-04-22 DIAGNOSIS — Z794 Long term (current) use of insulin: Secondary | ICD-10-CM | POA: Diagnosis not present

## 2016-04-24 NOTE — H&P (Signed)
David Mathews is an 75 y.o. male.    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 male who had an infected left UKR resection on 02/28/2016.  A antibiotic spacer was placed and the patient has been receiving IV antibiotics.  He has been followed by hi ID doctor as well.  He has been taking his IV antibiotics for 6 weeks and is now off of them.  Labs were obtained recently by ID and these will be used to determined how the patient is doing.  We have discussed various possibilities with regards to the next surgery.  He knows that out intention is to place an TKA in the left knee, but also knows that if there is infection present that he will need another antibiotic space and again go through IV antibiotics.  He is anxious about the possibilities of having another antibiotic spacer, but understands the plan.  Risks, benefits and expectations were discussed with the patient. Patient understand the risks, benefits and expectations and wishes to proceed with surgery.   PCP: [name]  D/C Plans:      Home with HHPT  Post-op Meds:       No Rx given  Tranexamic Acid:      To be given - IV   Decadron:      Is to be given  FYI:     ASA  Norco   PMH: Past Medical History  Diagnosis Date  . Hypothyroidism   . Diabetes mellitus without complication (HCC)   . Arthritis   . Infected prosthetic knee joint, right unicompartmental knee  06/14/2015  . Postoperative wound infection 07/19/2015  . MSSA (methicillin susceptible Staphylococcus aureus) infection 07/19/2015  . Neuromuscular disorder (HCC)     neuropathy FINGERS TIPS LEFT HAND, AND FEET  . BPH (benign prostatic hyperplasia)   . Complication of anesthesia February 28, 2016    "PT STATES HARD TO PUT TO SLEEP WITH LAST SURGERY"    PSH: Past Surgical History  Procedure Laterality Date  . Partial knee  arthroplasty Bilateral 09/2009  . Carpal tunnel release Left [id number]  . Carpal tunnel release Right 2009  . I&d extremity Right 06/15/2015    Procedure: IRRIGATION AND DEBRIDEMENT OF INFECTED PARTIAL RIGHT  KNEE AND POLY EXCHANGE, RIGHT KNEE SYNOVECTOMY;  Surgeon: Teryl Lucy, MD;  Location: [facility] OR;  Service: Orthopedics;  Laterality: Right;  . Lumbar wound debridement N/A 06/15/2015    Procedure: LUMBAR WOUND IRRIGATION AND DEBRIDEMENT;  Surgeon: Aliene Beams, MD;  Location: [facility] OR;  Service: Neurosurgery;  Laterality: N/A;  . Partial knee arthroplasty Right 02/28/2016    Procedure: RESECTION OF RIGHT UNI KNEE WITH PLACMENT WITH ANTIBIOTIC SPACERS;  Surgeon: Durene Romans, MD;  Location: [facility] ORS;  Service: Orthopedics;  Laterality: Right;  . Back surgery  10/2010     hx of 2 fusions LOWER L 2, 3, 4 AND 5  . Arthroscopic knee surgery Right 1984  . Picc line to right upper arm  March 02, 2016    Social History:  reports that he quit smoking about 16 years ago. His smoking use included Cigarettes. He has a 10 pack-year smoking history. He has never used smokeless tobacco. He reports that he drinks alcohol. He reports that he does not use illicit drugs.  Allergies:  Allergies  Allergen Reactions  . Amoxicillin  Rash and Other (See Comments)    Yeast Infection Has patient had a PCN reaction causing immediate rash, facial/tongue/throat swelling, SOB or lightheadedness with hypotension: No Has patient had a PCN reaction causing severe rash involving mucus membranes or skin necrosis: No Has patient had a PCN reaction that required hospitalization No Has patient had a PCN reaction occurring within the last 10 years: Yes If all of the above answers are "NO", then may proceed with Cephalosporin use.    Medications: No current facility-administered medications for this encounter.   Current Outpatient Prescriptions  Medication Sig Dispense Refill  . acetaminophen (TYLENOL) 500 MG tablet Take 1,000  mg by mouth See admin instructions. Take every 5 hours.    . Cholecalciferol (VITAMIN D PO) Take 1 tablet by mouth daily.    . Cyanocobalamin (VITAMIN B 12 PO) Take 1 tablet by mouth daily.    . diazepam (VALIUM) 5 MG tablet Take 1 tablet (5 mg total) by mouth every 6 (six) hours as needed for muscle spasms. 10 tablet 0  . docusate sodium (COLACE) 100 MG capsule Take 1 capsule (100 mg total) by mouth 2 (two) times daily. 10 capsule 0  . ferrous sulfate 325 (65 FE) MG tablet Take 1 tablet (325 mg total) by mouth 3 (three) times daily after meals. (Patient taking differently: Take 325 mg by mouth daily. )  3  . finasteride (PROSCAR) 5 MG tablet Take 5 mg by mouth every evening.     . fluconazole (DIFLUCAN) 200 MG tablet Take 1 tablet (200 mg total) by mouth daily. (Patient taking differently: Take 200 mg by mouth daily as needed (For yeast infection.). ) 10 tablet 2  . fluticasone (FLONASE) 50 MCG/ACT nasal spray Place 1 spray into both nostrils daily as needed for allergies.    [name] insulin aspart (NOVOLOG FLEXPEN) 100 UNIT/ML FlexPen Inject 10-15 Units into the skin 3 (three) times daily with meals. Use a sliding scale.    . Insulin Glargine (LANTUS SOLOSTAR) 100 UNIT/ML Solostar Pen Inject 55 Units into the skin daily. Per sliding scale (Patient taking differently: Inject 40 Units into the skin daily. ) 15 mL 11  . levothyroxine (SYNTHROID, LEVOTHROID) 88 MCG tablet Take 88 mcg by mouth daily before breakfast.    . lisinopril-hydrochlorothiazide (PRINZIDE,ZESTORETIC) 20-25 MG per tablet Take 1 tablet by mouth every morning.     . Loratadine 10 MG CAPS Take 10 mg by mouth every evening.     [name] MAGNESIUM PO Take 1 tablet by mouth daily.    . metFORMIN (GLUCOPHAGE) 1000 MG tablet Take 1,000 mg by mouth 2 (two) times daily with a meal.    . Multiple Vitamins-Minerals (MULTIVITAMIN PO) Take 1 tablet by mouth daily.    [name] OVER THE COUNTER MEDICATION Take 565 mg by mouth daily. Potassium 565mg     . oxyCODONE  (OXY IR/ROXICODONE) 5 MG immediate release tablet Take 1-3 tablets (5-15 mg total) by mouth every 4 (four) hours as needed for severe pain. 120 tablet 0  . polyethylene glycol (MIRALAX / GLYCOLAX) packet Take 17 g by mouth 2 (two) times daily. 14 each 0  . pregabalin (LYRICA) 225 MG capsule Take 225 mg by mouth 2 (two) times daily.    . silodosin (RAPAFLO) 8 MG CAPS capsule Take 8 mg by mouth daily with breakfast.    . simvastatin (ZOCOR) 20 MG tablet Take 20 mg by mouth every evening.     [name] tiZANidine (ZANAFLEX) 4 MG tablet Take 1 tablet (4  mg total) by mouth every 6 (six) hours as needed for muscle spasms. 40 tablet 0  . vancomycin 1,500 mg in sodium chloride 0.9 % 250 mL Inject 1,500 mg into the vein every 12 (twelve) hours.    . celecoxib (CELEBREX) 200 MG capsule Take 1 capsule (200 mg total) by mouth every 12 (twelve) hours. (Patient not taking: Reported on 04/06/2016) 60 capsule 0       Review of Systems  Constitutional: Negative.   HENT: Negative.   Eyes: Negative.   Respiratory: Negative.   Cardiovascular: Negative.   Gastrointestinal: Negative.   Genitourinary: Negative.   Musculoskeletal: Positive for back pain and joint pain.  Skin: Negative.   Neurological: Negative.   Endo/Heme/Allergies: Negative.   Psychiatric/Behavioral: Negative.       Physical Exam  Constitutional: He is oriented to person, place, and time. He appears well-developed.  HENT:  Head: Normocephalic.  Eyes: Pupils are equal, round, and reactive to light.  Neck: Neck supple. No JVD present. No tracheal deviation present. No thyromegaly present.  Cardiovascular: Normal rate, regular rhythm, normal heart sounds and intact distal pulses.   Respiratory: Effort normal and breath sounds normal. No stridor. No respiratory distress. He has no wheezes.  GI: Soft. There is no tenderness. There is no guarding.  Musculoskeletal:       Right knee: He exhibits decreased range of motion, laceration (healed  previous incision), abnormal alignment and bony tenderness. He exhibits no effusion, no ecchymosis, no deformity and no erythema. Tenderness found.  Lymphadenopathy:    He has no cervical adenopathy.  Neurological: He is alert and oriented to person, place, and time. A sensory deficit (neuropathy in bilateral LEs) is present.  Skin: Skin is warm and dry.  Psychiatric: He has a normal mood and affect.       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. Risks benefits and expectations were discussed with the patient. Patient understand risks, benefits and expectations and wishes to proceed.     Anastasio AuerbachMatthew S.    PA-C  04/24/2016, 3:50 PM

## 2016-04-25 DIAGNOSIS — M25569 Pain in unspecified knee: Secondary | ICD-10-CM | POA: Diagnosis not present

## 2016-04-25 DIAGNOSIS — T8453XD Infection and inflammatory reaction due to internal right knee prosthesis, subsequent encounter: Secondary | ICD-10-CM | POA: Diagnosis not present

## 2016-04-25 DIAGNOSIS — M25561 Pain in right knee: Secondary | ICD-10-CM | POA: Diagnosis not present

## 2016-04-25 DIAGNOSIS — Z96651 Presence of right artificial knee joint: Secondary | ICD-10-CM | POA: Diagnosis not present

## 2016-04-26 ENCOUNTER — Encounter: Payer: Self-pay | Admitting: Internal Medicine

## 2016-04-26 DIAGNOSIS — E1165 Type 2 diabetes mellitus with hyperglycemia: Secondary | ICD-10-CM | POA: Diagnosis not present

## 2016-04-26 DIAGNOSIS — Z794 Long term (current) use of insulin: Secondary | ICD-10-CM | POA: Diagnosis not present

## 2016-04-26 DIAGNOSIS — Z79899 Other long term (current) drug therapy: Secondary | ICD-10-CM | POA: Diagnosis not present

## 2016-04-26 DIAGNOSIS — E162 Hypoglycemia, unspecified: Secondary | ICD-10-CM | POA: Diagnosis not present

## 2016-04-27 NOTE — Progress Notes (Signed)
   03/01/16 2013  Restrictions  Weight Bearing Restrictions Yes  RLE Weight Bearing PWB  RLE Partial Weight Bearing Percentage or Pounds 50  PT G-Codes **NOT FOR INPATIENT CLASS**  Functional Assessment Tool Used clinical judgement  Functional Limitation Mobility: Walking and moving around  Mobility: Walking and Moving Around Current Status (J1914(G8978) CI  Mobility: Walking and Moving Around Goal Status [phone](G8979) CI  Drucilla Chaletara , PT Pager: [phone] 04/27/2016

## 2016-05-01 ENCOUNTER — Encounter (HOSPITAL_COMMUNITY): Payer: Self-pay | Admitting: *Deleted

## 2016-05-01 ENCOUNTER — Encounter (HOSPITAL_COMMUNITY)
Admission: RE | Disposition: A | Discharge status: Home / Self Care | Payer: Self-pay | Source: Ambulatory Visit | Attending: Orthopedic Surgery

## 2016-05-01 ENCOUNTER — Inpatient Hospital Stay (HOSPITAL_COMMUNITY): Payer: Medicare Other | Admitting: Anesthesiology

## 2016-05-01 ENCOUNTER — Inpatient Hospital Stay (HOSPITAL_COMMUNITY)
Admission: RE | Admit: 2016-05-01 | Discharge: 2016-05-02 | DRG: 468 | Disposition: A | Discharge status: Home Health | Payer: Medicare Other | Source: Ambulatory Visit | Attending: Orthopedic Surgery | Admitting: Orthopedic Surgery

## 2016-05-01 DIAGNOSIS — I10 Essential (primary) hypertension: Secondary | ICD-10-CM | POA: Diagnosis not present

## 2016-05-01 DIAGNOSIS — Z4733 Aftercare following explantation of knee joint prosthesis: Secondary | ICD-10-CM | POA: Diagnosis not present

## 2016-05-01 DIAGNOSIS — Z880 Allergy status to penicillin: Secondary | ICD-10-CM | POA: Diagnosis not present

## 2016-05-01 DIAGNOSIS — N400 Enlarged prostate without lower urinary tract symptoms: Secondary | ICD-10-CM | POA: Diagnosis not present

## 2016-05-01 DIAGNOSIS — Z79891 Long term (current) use of opiate analgesic: Secondary | ICD-10-CM

## 2016-05-01 DIAGNOSIS — T8579XA Infection and inflammatory reaction due to other internal prosthetic devices, implants and grafts, initial encounter: Secondary | ICD-10-CM | POA: Diagnosis not present

## 2016-05-01 DIAGNOSIS — Z96652 Presence of left artificial knee joint: Secondary | ICD-10-CM | POA: Diagnosis not present

## 2016-05-01 DIAGNOSIS — E1140 Type 2 diabetes mellitus with diabetic neuropathy, unspecified: Secondary | ICD-10-CM | POA: Diagnosis present

## 2016-05-01 DIAGNOSIS — Z96659 Presence of unspecified artificial knee joint: Secondary | ICD-10-CM

## 2016-05-01 DIAGNOSIS — Z96651 Presence of right artificial knee joint: Secondary | ICD-10-CM

## 2016-05-01 DIAGNOSIS — Z79899 Other long term (current) drug therapy: Secondary | ICD-10-CM

## 2016-05-01 DIAGNOSIS — Z794 Long term (current) use of insulin: Secondary | ICD-10-CM | POA: Diagnosis not present

## 2016-05-01 DIAGNOSIS — M25561 Pain in right knee: Secondary | ICD-10-CM | POA: Diagnosis not present

## 2016-05-01 DIAGNOSIS — Z7984 Long term (current) use of oral hypoglycemic drugs: Secondary | ICD-10-CM

## 2016-05-01 DIAGNOSIS — E039 Hypothyroidism, unspecified: Secondary | ICD-10-CM | POA: Diagnosis present

## 2016-05-01 DIAGNOSIS — M1711 Unilateral primary osteoarthritis, right knee: Secondary | ICD-10-CM | POA: Diagnosis not present

## 2016-05-01 DIAGNOSIS — E1165 Type 2 diabetes mellitus with hyperglycemia: Secondary | ICD-10-CM | POA: Diagnosis not present

## 2016-05-01 DIAGNOSIS — G8918 Other acute postprocedural pain: Secondary | ICD-10-CM | POA: Diagnosis not present

## 2016-05-01 DIAGNOSIS — Z87891 Personal history of nicotine dependence: Secondary | ICD-10-CM

## 2016-05-01 DIAGNOSIS — T8454XA Infection and inflammatory reaction due to internal left knee prosthesis, initial encounter: Secondary | ICD-10-CM | POA: Diagnosis not present

## 2016-05-01 HISTORY — PX: TOTAL KNEE REVISION: SHX996

## 2016-05-01 LAB — GLUCOSE, CAPILLARY
GLUCOSE-CAPILLARY: 175 mg/dL — AB (ref 65–99)
Glucose-Capillary: 225 mg/dL — ABNORMAL HIGH (ref 65–99)
Glucose-Capillary: 265 mg/dL — ABNORMAL HIGH (ref 65–99)
Glucose-Capillary: 412 mg/dL — ABNORMAL HIGH (ref 65–99)

## 2016-05-01 SURGERY — TOTAL KNEE REVISION
Anesthesia: General | Site: Knee | Laterality: Right

## 2016-05-01 MED ORDER — SODIUM CHLORIDE 0.9 % IJ SOLN
INTRAMUSCULAR | Status: AC
  Filled 2016-05-01: qty 50

## 2016-05-01 MED ORDER — ONDANSETRON HCL 4 MG PO TABS: 4.0000 mg | ORAL_TABLET | Freq: Four times a day (QID) | ORAL | Status: DC | PRN

## 2016-05-01 MED ORDER — CELECOXIB 200 MG PO CAPS
200.0000 mg | ORAL_CAPSULE | Freq: Two times a day (BID) | ORAL | Status: DC
  Administered 2016-05-01 – 2016-05-02 (×2): 200 mg via ORAL
  Filled 2016-05-01 (×2): qty 1

## 2016-05-01 MED ORDER — MIDAZOLAM HCL 5 MG/5ML IJ SOLN
INTRAMUSCULAR | Status: DC | PRN
  Administered 2016-05-01: 1 mg via INTRAVENOUS

## 2016-05-01 MED ORDER — ACETAMINOPHEN 500 MG PO TABS
1000.0000 mg | ORAL_TABLET | Freq: Three times a day (TID) | ORAL | Status: DC
  Administered 2016-05-01 – 2016-05-02 (×3): 1000 mg via ORAL
  Filled 2016-05-01 (×3): qty 2

## 2016-05-01 MED ORDER — LIDOCAINE HCL (CARDIAC) 20 MG/ML IV SOLN
INTRAVENOUS | Status: DC | PRN
  Administered 2016-05-01: 75 mg via INTRAVENOUS

## 2016-05-01 MED ORDER — MENTHOL 3 MG MT LOZG
1.0000 | LOZENGE | OROMUCOSAL | Status: DC | PRN
Start: 2016-05-01 — End: 2016-05-02
  Filled 2016-05-01: qty 9

## 2016-05-01 MED ORDER — FENTANYL CITRATE (PF) 250 MCG/5ML IJ SOLN
INTRAMUSCULAR | Status: AC
  Filled 2016-05-01: qty 5

## 2016-05-01 MED ORDER — HYDROMORPHONE HCL 1 MG/ML IJ SOLN
INTRAMUSCULAR | Status: AC
  Filled 2016-05-01: qty 1

## 2016-05-01 MED ORDER — MIDAZOLAM HCL 2 MG/2ML IJ SOLN
1.0000 mg | Freq: Once | INTRAMUSCULAR | Status: AC
Start: 2016-05-01 — End: 2016-05-01
  Administered 2016-05-01: 1 mg via INTRAVENOUS

## 2016-05-01 MED ORDER — VANCOMYCIN HCL IN DEXTROSE 1-5 GM/200ML-% IV SOLN
1000.0000 mg | INTRAVENOUS | Status: AC
  Administered 2016-05-01: 1000 mg via INTRAVENOUS
  Filled 2016-05-01: qty 200

## 2016-05-01 MED ORDER — METOCLOPRAMIDE HCL 5 MG PO TABS: 5.0000 mg | ORAL_TABLET | Freq: Three times a day (TID) | ORAL | Status: DC | PRN

## 2016-05-01 MED ORDER — PHENOL 1.4 % MT LIQD: 1.0000 | OROMUCOSAL | Status: DC | PRN

## 2016-05-01 MED ORDER — FENTANYL CITRATE (PF) 100 MCG/2ML IJ SOLN
50.0000 ug | Freq: Once | INTRAMUSCULAR | Status: AC
  Administered 2016-05-01: 50 ug via INTRAVENOUS

## 2016-05-01 MED ORDER — TAMSULOSIN HCL 0.4 MG PO CAPS
0.4000 mg | ORAL_CAPSULE | Freq: Every day | ORAL | Status: DC
Start: 2016-05-01 — End: 2016-05-02
  Administered 2016-05-01 – 2016-05-02 (×2): 0.4 mg via ORAL
  Filled 2016-05-01 (×2): qty 1

## 2016-05-01 MED ORDER — ALUM & MAG HYDROXIDE-SIMETH 200-200-20 MG/5ML PO SUSP: 30.0000 mL | ORAL | Status: DC | PRN

## 2016-05-01 MED ORDER — TRANEXAMIC ACID 1000 MG/10ML IV SOLN
1000.0000 mg | Freq: Once | INTRAVENOUS | Status: AC
  Administered 2016-05-01: 1000 mg via INTRAVENOUS
  Filled 2016-05-01: qty 10

## 2016-05-01 MED ORDER — ONDANSETRON HCL 4 MG/2ML IJ SOLN
INTRAMUSCULAR | Status: DC | PRN
  Administered 2016-05-01: 4 mg via INTRAVENOUS

## 2016-05-01 MED ORDER — STERILE WATER FOR IRRIGATION IR SOLN
Status: DC | PRN
  Administered 2016-05-01: 2000 mL

## 2016-05-01 MED ORDER — METOCLOPRAMIDE HCL 5 MG/ML IJ SOLN: 5.0000 mg | Freq: Three times a day (TID) | INTRAMUSCULAR | Status: DC | PRN

## 2016-05-01 MED ORDER — ROCURONIUM BROMIDE 100 MG/10ML IV SOLN
INTRAVENOUS | Status: AC
  Filled 2016-05-01: qty 1

## 2016-05-01 MED ORDER — SODIUM CHLORIDE 0.9 % IV SOLN
INTRAVENOUS | Status: DC
  Administered 2016-05-01: 17:00:00 via INTRAVENOUS

## 2016-05-01 MED ORDER — HYDROMORPHONE HCL 1 MG/ML IJ SOLN: 0.5000 mg | INTRAMUSCULAR | Status: DC | PRN

## 2016-05-01 MED ORDER — DEXAMETHASONE SODIUM PHOSPHATE 10 MG/ML IJ SOLN
10.0000 mg | Freq: Once | INTRAMUSCULAR | Status: AC
  Administered 2016-05-01: 10 mg via INTRAVENOUS

## 2016-05-01 MED ORDER — SUCCINYLCHOLINE CHLORIDE 20 MG/ML IJ SOLN
INTRAMUSCULAR | Status: DC | PRN
  Administered 2016-05-01: 100 mg via INTRAVENOUS

## 2016-05-01 MED ORDER — PREGABALIN 75 MG PO CAPS
225.0000 mg | ORAL_CAPSULE | Freq: Two times a day (BID) | ORAL | Status: DC
  Administered 2016-05-01 – 2016-05-02 (×2): 225 mg via ORAL
  Filled 2016-05-01 (×3): qty 3

## 2016-05-01 MED ORDER — BISACODYL 10 MG RE SUPP: 10.0000 mg | Freq: Every day | RECTAL | Status: DC | PRN

## 2016-05-01 MED ORDER — METHOCARBAMOL 1000 MG/10ML IJ SOLN
500.0000 mg | Freq: Four times a day (QID) | INTRAVENOUS | Status: DC | PRN
  Administered 2016-05-01: 500 mg via INTRAVENOUS
  Filled 2016-05-01: qty 5
  Filled 2016-05-01: qty 550

## 2016-05-01 MED ORDER — PROPOFOL 10 MG/ML IV BOLUS
INTRAVENOUS | Status: DC | PRN
  Administered 2016-05-01: 150 mg via INTRAVENOUS

## 2016-05-01 MED ORDER — FENTANYL CITRATE (PF) 100 MCG/2ML IJ SOLN
INTRAMUSCULAR | Status: DC | PRN
Start: 2016-05-01 — End: 2016-05-01
  Administered 2016-05-01 (×2): 50 ug via INTRAVENOUS
  Administered 2016-05-01: 100 ug via INTRAVENOUS
  Administered 2016-05-01 (×2): 50 ug via INTRAVENOUS

## 2016-05-01 MED ORDER — METHOCARBAMOL 500 MG PO TABS: 500.0000 mg | ORAL_TABLET | Freq: Four times a day (QID) | ORAL | Status: DC | PRN

## 2016-05-01 MED ORDER — DEXAMETHASONE SODIUM PHOSPHATE 10 MG/ML IJ SOLN
10.0000 mg | Freq: Once | INTRAMUSCULAR | Status: DC
  Filled 2016-05-01: qty 1

## 2016-05-01 MED ORDER — BUPIVACAINE-EPINEPHRINE 0.25% -1:200000 IJ SOLN
INTRAMUSCULAR | Status: AC
  Filled 2016-05-01: qty 1

## 2016-05-01 MED ORDER — EPHEDRINE SULFATE 50 MG/ML IJ SOLN
INTRAMUSCULAR | Status: DC | PRN
  Administered 2016-05-01 (×2): 10 mg via INTRAVENOUS

## 2016-05-01 MED ORDER — METFORMIN HCL 500 MG PO TABS
1000.0000 mg | ORAL_TABLET | Freq: Two times a day (BID) | ORAL | Status: DC
  Administered 2016-05-01 – 2016-05-02 (×2): 1000 mg via ORAL
  Filled 2016-05-01 (×2): qty 2

## 2016-05-01 MED ORDER — FINASTERIDE 5 MG PO TABS
5.0000 mg | ORAL_TABLET | Freq: Every evening | ORAL | Status: DC
  Administered 2016-05-01: 5 mg via ORAL
  Filled 2016-05-01: qty 1

## 2016-05-01 MED ORDER — LORATADINE 10 MG PO TABS
10.0000 mg | ORAL_TABLET | Freq: Every evening | ORAL | Status: DC
  Administered 2016-05-01: 10 mg via ORAL
  Filled 2016-05-01: qty 1

## 2016-05-01 MED ORDER — SODIUM CHLORIDE 0.9 % IR SOLN
Status: DC | PRN
  Administered 2016-05-01: 3000 mL

## 2016-05-01 MED ORDER — ASPIRIN EC 325 MG PO TBEC
325.0000 mg | DELAYED_RELEASE_TABLET | Freq: Two times a day (BID) | ORAL | Status: DC
  Administered 2016-05-02: 325 mg via ORAL
  Filled 2016-05-01: qty 1

## 2016-05-01 MED ORDER — MAGNESIUM CITRATE PO SOLN: 1.0000 | Freq: Once | ORAL | Status: DC | PRN

## 2016-05-01 MED ORDER — FERROUS SULFATE 325 (65 FE) MG PO TABS
325.0000 mg | ORAL_TABLET | Freq: Three times a day (TID) | ORAL | Status: DC
  Administered 2016-05-01 – 2016-05-02 (×2): 325 mg via ORAL
  Filled 2016-05-01 (×2): qty 1

## 2016-05-01 MED ORDER — FLUTICASONE PROPIONATE 50 MCG/ACT NA SUSP
1.0000 | Freq: Every day | NASAL | Status: DC | PRN
  Filled 2016-05-01: qty 16

## 2016-05-01 MED ORDER — FENTANYL CITRATE (PF) 100 MCG/2ML IJ SOLN
INTRAMUSCULAR | Status: AC
  Filled 2016-05-01: qty 2

## 2016-05-01 MED ORDER — TAMSULOSIN HCL 0.4 MG PO CAPS: 0.4000 mg | ORAL_CAPSULE | Freq: Every day | ORAL | Status: DC

## 2016-05-01 MED ORDER — LEVOTHYROXINE SODIUM 88 MCG PO TABS
88.0000 ug | ORAL_TABLET | Freq: Every day | ORAL | Status: DC
  Administered 2016-05-02: 88 ug via ORAL
  Filled 2016-05-01: qty 1

## 2016-05-01 MED ORDER — DOCUSATE SODIUM 100 MG PO CAPS
100.0000 mg | ORAL_CAPSULE | Freq: Two times a day (BID) | ORAL | Status: DC
  Administered 2016-05-01 – 2016-05-02 (×2): 100 mg via ORAL
  Filled 2016-05-01 (×2): qty 1

## 2016-05-01 MED ORDER — PREGABALIN 75 MG PO CAPS: 225.0000 mg | ORAL_CAPSULE | Freq: Two times a day (BID) | ORAL | Status: DC

## 2016-05-01 MED ORDER — INSULIN ASPART 100 UNIT/ML ~~LOC~~ SOLN
0.0000 [IU] | Freq: Every day | SUBCUTANEOUS | Status: DC
Start: 2016-05-01 — End: 2016-05-02
  Administered 2016-05-01: 5 [IU] via SUBCUTANEOUS

## 2016-05-01 MED ORDER — DIAZEPAM 5 MG PO TABS: 5.0000 mg | ORAL_TABLET | Freq: Four times a day (QID) | ORAL | Status: DC | PRN

## 2016-05-01 MED ORDER — LACTATED RINGERS IV SOLN
INTRAVENOUS | Status: DC
  Administered 2016-05-01 (×3): via INTRAVENOUS

## 2016-05-01 MED ORDER — LIDOCAINE HCL (CARDIAC) 20 MG/ML IV SOLN
INTRAVENOUS | Status: AC
  Filled 2016-05-01: qty 5

## 2016-05-01 MED ORDER — ONDANSETRON HCL 4 MG/2ML IJ SOLN: 4.0000 mg | Freq: Four times a day (QID) | INTRAMUSCULAR | Status: DC | PRN

## 2016-05-01 MED ORDER — KETOROLAC TROMETHAMINE 30 MG/ML IJ SOLN
INTRAMUSCULAR | Status: AC
  Filled 2016-05-01: qty 1

## 2016-05-01 MED ORDER — VANCOMYCIN HCL IN DEXTROSE 1-5 GM/200ML-% IV SOLN
1000.0000 mg | Freq: Two times a day (BID) | INTRAVENOUS | Status: AC
  Administered 2016-05-01: 1000 mg via INTRAVENOUS
  Filled 2016-05-01: qty 200

## 2016-05-01 MED ORDER — 0.9 % SODIUM CHLORIDE (POUR BTL) OPTIME
TOPICAL | Status: DC | PRN
  Administered 2016-05-01: 1000 mL

## 2016-05-01 MED ORDER — SIMVASTATIN 20 MG PO TABS
20.0000 mg | ORAL_TABLET | Freq: Every evening | ORAL | Status: DC
  Administered 2016-05-01: 20 mg via ORAL
  Filled 2016-05-01: qty 1

## 2016-05-01 MED ORDER — PROMETHAZINE HCL 25 MG/ML IJ SOLN: 6.2500 mg | INTRAMUSCULAR | Status: DC | PRN

## 2016-05-01 MED ORDER — DIPHENHYDRAMINE HCL 25 MG PO CAPS: 25.0000 mg | ORAL_CAPSULE | Freq: Four times a day (QID) | ORAL | Status: DC | PRN

## 2016-05-01 MED ORDER — OXYCODONE HCL 5 MG PO TABS
5.0000 mg | ORAL_TABLET | ORAL | Status: DC
  Administered 2016-05-01 (×3): 10 mg via ORAL
  Administered 2016-05-02 (×2): 15 mg via ORAL
  Filled 2016-05-01: qty 1
  Filled 2016-05-01: qty 3
  Filled 2016-05-01: qty 2
  Filled 2016-05-01: qty 3
  Filled 2016-05-01: qty 1
  Filled 2016-05-01: qty 2

## 2016-05-01 MED ORDER — INSULIN ASPART 100 UNIT/ML ~~LOC~~ SOLN
0.0000 [IU] | Freq: Three times a day (TID) | SUBCUTANEOUS | Status: DC
Start: 2016-05-01 — End: 2016-05-02
  Administered 2016-05-01: 5 [IU] via SUBCUTANEOUS
  Administered 2016-05-02: 8 [IU] via SUBCUTANEOUS
  Filled 2016-05-01: qty 1

## 2016-05-01 MED ORDER — HYDROMORPHONE HCL 1 MG/ML IJ SOLN
0.2500 mg | INTRAMUSCULAR | Status: DC | PRN
  Administered 2016-05-01 (×4): 0.5 mg via INTRAVENOUS

## 2016-05-01 MED ORDER — ONDANSETRON HCL 4 MG/2ML IJ SOLN
INTRAMUSCULAR | Status: AC
  Filled 2016-05-01: qty 2

## 2016-05-01 MED ORDER — BUPIVACAINE-EPINEPHRINE (PF) 0.5% -1:200000 IJ SOLN
INTRAMUSCULAR | Status: AC
  Filled 2016-05-01: qty 30

## 2016-05-01 MED ORDER — BUPIVACAINE-EPINEPHRINE (PF) 0.5% -1:200000 IJ SOLN
INTRAMUSCULAR | Status: DC | PRN
  Administered 2016-05-01: 30 mL via PERINEURAL

## 2016-05-01 MED ORDER — POLYETHYLENE GLYCOL 3350 17 G PO PACK
17.0000 g | PACK | Freq: Two times a day (BID) | ORAL | Status: DC
  Administered 2016-05-02: 17 g via ORAL
  Filled 2016-05-01: qty 1

## 2016-05-01 MED ORDER — MIDAZOLAM HCL 2 MG/2ML IJ SOLN
INTRAMUSCULAR | Status: AC
  Filled 2016-05-01: qty 2

## 2016-05-01 SURGICAL SUPPLY — 63 items
BANDAGE ACE 6X5 VEL STRL LF (GAUZE/BANDAGES/DRESSINGS) ×3 IMPLANT
BANDAGE ELASTIC 6 VELCRO ST LF (GAUZE/BANDAGES/DRESSINGS) ×3 IMPLANT
BLADE SAW SGTL 13.0X1.19X90.0M (BLADE) ×3 IMPLANT
BLADE SAW SGTL 81X20 HD (BLADE) ×3 IMPLANT
BONE CEMENT GENTAMICIN (Cement) ×9 IMPLANT
BRUSH FEMORAL CANAL (MISCELLANEOUS) ×3 IMPLANT
CEMENT BONE GENTAMICIN 40 (Cement) ×3 IMPLANT
CLOTH BEACON ORANGE TIMEOUT ST (SAFETY) ×3 IMPLANT
CUFF TOURN SGL QUICK 34 (TOURNIQUET CUFF) ×2
CUFF TRNQT CYL 34X4X40X1 (TOURNIQUET CUFF) ×1 IMPLANT
DRAPE U-SHAPE 47X51 STRL (DRAPES) ×3 IMPLANT
DRESSING AQUACEL AG SP 3.5X10 (GAUZE/BANDAGES/DRESSINGS) IMPLANT
DRSG ADAPTIC 3X8 NADH LF (GAUZE/BANDAGES/DRESSINGS) IMPLANT
DRSG AQUACEL AG ADV 3.5X10 (GAUZE/BANDAGES/DRESSINGS) ×3 IMPLANT
DRSG AQUACEL AG ADV 3.5X14 (GAUZE/BANDAGES/DRESSINGS) ×3 IMPLANT
DRSG AQUACEL AG SP 3.5X10 (GAUZE/BANDAGES/DRESSINGS)
DRSG PAD ABDOMINAL 8X10 ST (GAUZE/BANDAGES/DRESSINGS) IMPLANT
DRSG TEGADERM 4X4.75 (GAUZE/BANDAGES/DRESSINGS) IMPLANT
DURAPREP 26ML APPLICATOR (WOUND CARE) ×6 IMPLANT
ELECT REM PT RETURN 9FT ADLT (ELECTROSURGICAL) ×3
ELECTRODE REM PT RTRN 9FT ADLT (ELECTROSURGICAL) ×1 IMPLANT
FEMUR SIGMA PS SZ 4.0N R (Femur) ×3 IMPLANT
GAUZE SPONGE 2X2 8PLY STRL LF (GAUZE/BANDAGES/DRESSINGS) IMPLANT
GAUZE SPONGE 4X4 12PLY STRL (GAUZE/BANDAGES/DRESSINGS) IMPLANT
GLOVE BIOGEL PI IND STRL 7.5 (GLOVE) ×4 IMPLANT
GLOVE BIOGEL PI IND STRL 8.5 (GLOVE) ×2 IMPLANT
GLOVE BIOGEL PI INDICATOR 7.5 (GLOVE) ×8
GLOVE BIOGEL PI INDICATOR 8.5 (GLOVE) ×4
GLOVE ECLIPSE 8.0 STRL XLNG CF (GLOVE) ×6 IMPLANT
GLOVE ORTHO TXT STRL SZ7.5 (GLOVE) ×6 IMPLANT
GLOVE SURG SS PI 7.5 STRL IVOR (GLOVE) ×6 IMPLANT
GOWN STRL REUS W/ TWL XL LVL3 (GOWN DISPOSABLE) ×1 IMPLANT
GOWN STRL REUS W/TWL LRG LVL3 (GOWN DISPOSABLE) ×3 IMPLANT
GOWN STRL REUS W/TWL XL LVL3 (GOWN DISPOSABLE) ×8 IMPLANT
HANDPIECE INTERPULSE COAX TIP (DISPOSABLE) ×2
IMMOBILIZER KNEE 20 (SOFTGOODS)
IMMOBILIZER KNEE 20 THIGH 36 (SOFTGOODS) IMPLANT
LIQUID BAND (GAUZE/BANDAGES/DRESSINGS) ×3 IMPLANT
MANIFOLD NEPTUNE II (INSTRUMENTS) ×3 IMPLANT
PADDING CAST COTTON 6X4 STRL (CAST SUPPLIES) IMPLANT
PATELLA DOME PFC 38MM (Knees) ×3 IMPLANT
PLATE ROT INSERT 12.5MM SIZE 4 (Plate) ×3 IMPLANT
POSITIONER SURGICAL ARM (MISCELLANEOUS) ×3 IMPLANT
POST AUG PFC 4MM SZ 2.6 (Knees) ×3 IMPLANT
SET HNDPC FAN SPRY TIP SCT (DISPOSABLE) ×1 IMPLANT
SET PAD KNEE POSITIONER (MISCELLANEOUS) ×3 IMPLANT
SPONGE GAUZE 2X2 STER 10/PKG (GAUZE/BANDAGES/DRESSINGS)
SPONGE LAP 18X18 X RAY DECT (DISPOSABLE) IMPLANT
STAPLER VISISTAT 35W (STAPLE) IMPLANT
SUT MNCRL AB 3-0 PS2 18 (SUTURE) ×3 IMPLANT
SUT VIC AB 1 CT1 36 (SUTURE) ×3 IMPLANT
SUT VIC AB 2-0 CT1 27 (SUTURE) ×6
SUT VIC AB 2-0 CT1 TAPERPNT 27 (SUTURE) ×3 IMPLANT
SUT VLOC 180 0 24IN GS25 (SUTURE) ×3 IMPLANT
SYR 50ML LL SCALE MARK (SYRINGE) ×3 IMPLANT
TOWER CARTRIDGE SMART MIX (DISPOSABLE) ×3 IMPLANT
TRAY FOLEY W/METER SILVER 16FR (SET/KITS/TRAYS/PACK) ×3 IMPLANT
TRAY REVISION SZ 3 (Knees) ×3 IMPLANT
TRAY SLEEVE CEM ML (Knees) ×3 IMPLANT
TUBE KAMVAC SUCTION (TUBING) IMPLANT
WEDGE SIZE 3 5MM (Knees) ×6 IMPLANT
WRAP KNEE MAXI GEL POST OP (GAUZE/BANDAGES/DRESSINGS) ×3 IMPLANT
YANKAUER SUCT BULB TIP 10FT TU (MISCELLANEOUS) ×3 IMPLANT

## 2016-05-01 NOTE — Transfer of Care (Signed)
Immediate Anesthesia Transfer of Care Note  Patient: David Mathews  Procedure(s) Performed: Procedure(s) with comments: TOTAL KNEE REVISION REIMPLANTATION (Right) - Adductor block in holding  Patient Location: PACU  Anesthesia Type:General  Level of Consciousness: sedated, patient cooperative and responds to stimulation  Airway & Oxygen Therapy: Patient Spontanous Breathing and Patient connected to face mask oxygen  Post-op Assessment: Report given to RN and Post -op Vital signs reviewed and stable  Post vital signs: Reviewed and stable  Last Vitals:  Filed Vitals:   05/01/16 1106 05/01/16 1107  BP:    Pulse: 80 86  Temp:    Resp: 13     Last Pain:  Filed Vitals:   05/01/16 1108  PainSc: 3          Complications: No apparent anesthesia complications

## 2016-05-01 NOTE — Interval H&P Note (Signed)
History and Physical Interval Note:  05/01/2016 10:13 AM  David Mathews  has presented today for surgery, with the diagnosis of STATUS POST RESECTION INFECTED RIGHT KNEE  The various methods of treatment have been discussed with the patient and family. After consideration of risks, benefits and other options for treatment, the patient has consented to  Procedure(s): TOTAL KNEE REVISION REIMPLANTATION (Right) as a surgical intervention .  The patient's history has been reviewed, patient examined, no change in status, stable for surgery.  I have reviewed the patient's chart and labs.  Questions were answered to the patient's satisfaction.     Shelda PalLIN, D

## 2016-05-01 NOTE — Brief Op Note (Signed)
05/01/2016  1:02 PM  PATIENT:  David Mathews  75 y.o. male  PRE-OPERATIVE DIAGNOSIS:  STATUS POST RESECTION INFECTED RIGHT KNEE partial knee replacement  POST-OPERATIVE DIAGNOSIS:  STATUS POST RESECTION INFECTED RIGHT KNEE partial knee placement  PROCEDURE:  Procedure(s) with comments: TOTAL KNEE REVISION REIMPLANTATION (Right) -  SURGEON:  Surgeon(s) and Role:    * Durene RomansMatthew , MD - Primary  PHYSICIAN ASSISTANT: Lanney GinsMatthew Babish, PA-C  ANESTHESIA:   regional and general  EBL:  Total I/O In: 1000 [I.V.:1000] Out: 950 [Urine:800; Blood:150]  BLOOD ADMINISTERED:none  DRAINS: none   LOCAL MEDICATIONS USED:  None due to regional block     SPECIMEN:  No Specimen  DISPOSITION OF SPECIMEN:  N/A  COUNTS:  YES  TOURNIQUET:   Total Tourniquet Time Documented: Thigh (Right) - 47 minutes Total: Thigh (Right) - 47 minutes   DICTATION: .Other Dictation: Dictation Number [id number]  PLAN OF CARE: Admit to inpatient   PATIENT DISPOSITION:  PACU - hemodynamically stable.   Delay start of Pharmacological VTE agent (>24hrs) due to surgical blood loss or risk of bleeding: no

## 2016-05-01 NOTE — Anesthesia Procedure Notes (Addendum)
Anesthesia Regional Block:  Adductor canal block  Pre-Anesthetic Checklist: ,, timeout performed, Correct Patient, Correct Site, Correct Laterality, Correct Procedure, Correct Position, site marked, Risks and benefits discussed,  Surgical consent,  Pre-op evaluation,  At surgeon's request and post-op pain management  Laterality: Lower and Right  Prep: chloraprep       Needles:  Injection technique: Single-shot  Needle Type: Echogenic Needle     Needle Length: 10cm 10 cm Needle Gauge: 21 and 21 G    Additional Needles:  Procedures: ultrasound guided (picture in chart) Adductor canal block  Nerve Stimulator or Paresthesia:  Response: 0.6 mA,   Additional Responses:   Narrative:  Injection made incrementally with aspirations every 5 mL.  Performed by: Personally  Anesthesiologist: Sherrian DiversENENNY, BRUCE  Additional Notes: No pain on injection. No increased resistance to injection. Motor intact immediately after block.   Procedure Name: Intubation Date/Time: 05/01/2016 11:21 AM Performed by: Elyn PeersALLEN,  J Pre-anesthesia Checklist: Patient identified, Emergency Drugs available, Suction available, Patient being monitored and Timeout performed Patient Re-evaluated:Patient Re-evaluated prior to inductionOxygen Delivery Method: Circle system utilized Preoxygenation: Pre-oxygenation with 100% oxygen Intubation Type: IV induction Ventilation: Mask ventilation without difficulty and Oral airway inserted - appropriate to patient size Laryngoscope Size: Miller and 3 Grade View: Grade II Tube type: Oral Number of attempts: 1 Airway Equipment and Method: Stylet Placement Confirmation: ETT inserted through vocal cords under direct vision,  positive ETCO2 and breath sounds checked- equal and bilateral Secured at: 22 cm Tube secured with: Tape Dental Injury: Teeth and Oropharynx as per pre-operative assessment  Difficulty Due To: Difficulty was anticipated Future Recommendations:  Recommend- induction with short-acting agent, and alternative techniques readily available Comments: Minimal movement to neck.   Able to visualize base of laryngeal opening with cricoid pressure applied.   Tube passed smoothly.

## 2016-05-01 NOTE — Anesthesia Postprocedure Evaluation (Signed)
Anesthesia Post Note  Patient: David Mathews  Procedure(s) Performed: Procedure(s) (LRB): TOTAL KNEE REVISION REIMPLANTATION (Right)  Patient location during evaluation: PACU Anesthesia Type: General and Regional Level of consciousness: awake and alert Pain management: pain level controlled Vital Signs Assessment: post-procedure vital signs reviewed and stable Respiratory status: spontaneous breathing, nonlabored ventilation, respiratory function stable and patient connected to nasal cannula oxygen Cardiovascular status: blood pressure returned to baseline and stable Postop Assessment: no signs of nausea or vomiting Anesthetic complications: no    Last Vitals:  Filed Vitals:   05/01/16 1415 05/01/16 1426  BP: 128/86 129/64  Pulse: 98 97  Temp:  36.8 C  Resp: 12 15    Last Pain:  Filed Vitals:   05/01/16 1428  PainSc: 6                  , J

## 2016-05-01 NOTE — Discharge Instructions (Signed)

## 2016-05-01 NOTE — Progress Notes (Signed)
Assisted Dr. Council Mechanicenenny with right ultrasound assisted adductor canal block. Side rails up, monitors on throughout procedure. See vital signs in flow sheet. Tolerated Procedure well.

## 2016-05-01 NOTE — Op Note (Addendum)
David Mathews            ACCOUNT NO.:  [id number]  MEDICAL RECORD NO.:  [id number]  LOCATION:  1619                         FACILITY:  [facility]  PHYSICIAN:  Madlyn Frankel. Charlann Boxer, M.D.  DATE OF BIRTH:  12-15-1940  DATE OF PROCEDURE:  05/01/2016 DATE OF DISCHARGE:                              OPERATIVE REPORT   PREOPERATIVE DIAGNOSIS:  Status post resection of infected right partial knee arthroplasty with placement of antibiotic spacer.  POSTOPERATIVE DIAGNOSIS:  Status post resection of infected right partial knee arthroplasty with placement of antibiotic spacer.  PROCEDURE: 1. Removal of antibiotic spacer. 2. Revision of right total knee arthroplasty utilizing DePuy Sigma knee     system with a size 4 narrow femur, with a 4 mm posterior medial     augment, size 3 MBT revision tray with +5 mm medial and lateral     augments and a size 12.5 insert with a 41 patellar button.  SURGEON:  Madlyn Frankel. Charlann Boxer, MD  ASSISTANT:  Lanney Gins, PA-C  ANESTHESIA:  Spinal.  DRAINS:  None.  TOURNIQUET TIME:  Noted to be 47 minutes at 250 mmHg.  SPECIMENS:  None.  INDICATIONS FOR PROCEDURE:  David Mathews is a pleasant 75 year old male with history of right partial knee arthroplasty that was complicated by infection and attempted salvage.  He was seen in consultation for this, in approximately 2 months ago, resected his infected partial knee arthroplasty and made preliminary cuts for the distal femur and proximal tibia and placed the antibiotic spacer.  We insisted on a tighter control of his diabetes and we were able to get his A1c level down to about 8.5.  Repeat labs prior to surgery indicated a sedimentation rate and C-reactive protein that were basically normalized.  I had subsequently sent in a knee aspirate to a [facility] lab that revealed white cell count, with 84% neutrophils, but it was alpha defensive positive.  I had multiple discussion with David Mathews regarding  this reimplantation timing after antibiotic treatment.  I discussed with him the current literature based on operative incident, but that his labs and the 84% neutrophils indicate treatment of his infection.  Rather than proceed with a repeat of the antibiotic spacer, he wanted to proceed with a total knee arthroplasty.  Reviewed the risk of infection with him.  We discussed postoperative antibiotics long-term orally.  We discussed the standard risks of infection, DVT, as well as the postoperative course and expectations.  He was definitely ready to proceed in this fashion.  Consent was obtained for benefit of pain relief.  PROCEDURE IN DETAIL:  The patient was brought to the operative theater. Once adequate anesthesia, preop antibiotics administered, vancomycin as well as tranexamic acid, he was positioned supine with a right thigh tourniquet placed.  The right lower extremity was then prepped and draped in sterile fashion.  Time-out was performed identifying the patient, planned procedure, and the extremity.  The right lower extremity was prepped and draped in sterile fashion with the [facility] leg holder.  The leg was then exsanguinated.  Tourniquet elevated to 250 mmHg.  The patient's old midline incision was excised, soft tissue planes created.  A median arthrotomy was made encountering a very  large seroma based synovial fluid effusion.  There was no obvious purulence.  Following initial exposure and removal of nonviable tissue and recreating medial and lateral gutters and suprapatellar pouches, an osteotome was used to remove the femoral cemented and tibial cemented spacers, antibiotic delayed spacers.  I then removed the remaining cement off the proximal tibia.  At this point, I focused attention to the tibia.  I revisited the proximal tibial cut using extramedullary guide removing just a minimal bone-on-bone at this point.  We then sized the proximal tibia size 3, size 3 tray  was pinned into place, drilled for an MBT revision tray and then broached for 29 cemented sleeve.  I put a trial tray and placed, used this for rotational assessment of the femoral component.  The femoral cuts had already been made.  I removed all the remaining nonviable tissue.  I then placed the 4 in 1 cutting block into the femur and checked these cuts.  I did find that I was going to need a 4 mm posterior medial augment based on his previous partial knee arthroplasty.  The anterior posterior chamfer cuts were revisited followed by the box cut through the base and lateral aspect of distal femur.  At this point, trial reduction was carried out with a 4 narrow femur, 3 MBT revision tray already in place.  With this, I found that there was hyperextension with up to a 15 mm insert.  For that reason, we elected to use the 5 mm augments.  Given these findings, I removed all the trial components following final cleaning of the knee including pulse lavage irrigation with the canal brush irrigator as well as the knee itself.  Final cement was mixed. Gentamicin impregnated and HV cement was utilized.  Final components were opened and configured on the back table under direct supervision.  The final size 3 MBT revision tray with 29 cemented sleeve and 5 mm augments were then impacted into place through the medial 3rd of the tubercle.  The final femoral component was cemented in place.  The knee was brought to extension and the 41 patellar button cemented into place.  Once the cement had cured, excessive cement was removed and throughout the knee, we re-trialed with a 12.5 insert and selected this as the final insert.  The final 12.5 posterior stable insert was then placed onto the tray and the knee reduced. The knee was irrigated throughout the casing at this point.  We reapproximated the extensor mechanism using #1 Vicryl and 0 V-Loc sutures.  The remainder of the wound was closed with 2-0  Vicryl and a running 3-0 Monocryl.  The knee was cleaned, dried, and dressed sterilely using surgical glue and an Aquacel dressing.  He was then brought to the recovery room in stable condition tolerating the procedure well.  Findings reviewed with his son.  He will remain in the hospital for 2 days and will be discharged home.  We will place him on oral antibiotics per our discussion for up to 6 months as tolerable.     Madlyn FrankelMatthew D. Charlann Boxerlin, M.D.     MDO/MEDQ  D:  05/01/2016  T:  05/01/2016  Job:  [id number]

## 2016-05-01 NOTE — Anesthesia Preprocedure Evaluation (Addendum)
Anesthesia Evaluation  Patient identified by MRN, date of birth, ID band Patient awake  General Assessment Comment:Past Medical History Diagnosis Date . Hypothyroidism  . Diabetes mellitus without complication (HCC)  . Arthritis  . Infected prosthetic knee joint, right unicompartmental knee  06/14/2015 . Postoperative wound infection 07/19/2015 . MSSA (methicillin susceptible Staphylococcus aureus) infection 07/19/2015 . Neuromuscular disorder (HCC)    neuropathy FINGERS TIPS LEFT HAND, AND FEET . BPH (benign prostatic hyperplasia)  . Complication of anesthesia February 28, 2016   "PT STATES HARD TO PUT TO SLEEP WITH LAST SURGERY"       Reviewed: Allergy & Precautions, NPO status , Patient's Chart, lab work & pertinent test results  History of Anesthesia Complications (+) PONV and history of anesthetic complications  Airway Mallampati: II  TM Distance: >3 FB Neck ROM: Full    Dental no notable dental hx.    Pulmonary former smoker,    Pulmonary exam normal breath sounds clear to auscultation       Cardiovascular hypertension, Pt. on medications Normal cardiovascular exam Rhythm:Regular Rate:Normal  ECHO 06-18-15: Study Conclusions  - Left ventricle: The cavity size was normal. Wall thickness was  normal. Systolic function was normal. The estimated ejection  fraction was in the range of 55% to 60%. Wall motion was normal;  there were no regional wall motion abnormalities. Doppler  parameters are consistent with abnormal left ventricular  relaxation (grade 1 diastolic dysfunction). - Aortic valve: Valve area (VTI): 1.82 cm^2. Valve area (Vmax):  1.79 cm^2. Valve area (Vmean): 1.64 cm^2. - Mitral valve: Calcified annulus. - Pericardium, extracardiac: A trivial pericardial effusion was  identified.  Impressions:  - Normal LV function; grade 1 diastolic dysfunction; mildly   calcified aortic valve; mildly elevated velocity of 2.2 m/s  suggests mild AS but visually valve opens well.   Neuro/Psych  Neuromuscular disease negative psych ROS   GI/Hepatic negative GI ROS, Neg liver ROS,   Endo/Other  diabetes, Type 2, Oral Hypoglycemic Agents, Insulin DependentHypothyroidism   Renal/GU negative Renal ROS  negative genitourinary   Musculoskeletal  (+) Arthritis ,   Abdominal   Peds negative pediatric ROS (+)  Hematology negative hematology ROS (+)   Anesthesia Other Findings   Reproductive/Obstetrics negative OB ROS                          Anesthesia Physical Anesthesia Plan  ASA: II  Anesthesia Plan: General   Post-op Pain Management: GA combined w/ Regional for post-op pain   Induction: Intravenous  Airway Management Planned: Oral ETT  Additional Equipment:   Intra-op Plan:   Post-operative Plan: Extubation in OR  Informed Consent: I have reviewed the patients History and Physical, chart, labs and discussed the procedure including the risks, benefits and alternatives for the proposed anesthesia with the patient or authorized representative who has indicated his/her understanding and acceptance.   Dental advisory given  Plan Discussed with: CRNA  Anesthesia Plan Comments: (S/P lumbar fusion from L-2 to L-5.  Plan general with ACB. )      Anesthesia Quick Evaluation

## 2016-05-02 LAB — BASIC METABOLIC PANEL
Anion gap: 8 (ref 5–15)
BUN: 15 mg/dL (ref 6–20)
CHLORIDE: 88 mmol/L — AB (ref 101–111)
CO2: 27 mmol/L (ref 22–32)
CREATININE: 0.85 mg/dL (ref 0.61–1.24)
Calcium: 8.4 mg/dL — ABNORMAL LOW (ref 8.9–10.3)
GFR calc Af Amer: >60 mL/min (ref >60)
GFR calc non Af Amer: >60 mL/min (ref >60)
GLUCOSE: 342 mg/dL — AB (ref 65–99)
Potassium: 4.1 mmol/L (ref 3.5–5.1)
Sodium: 123 mmol/L — ABNORMAL LOW (ref 135–145)

## 2016-05-02 LAB — CBC
HEMATOCRIT: 28.4 % — AB (ref 39.0–52.0)
HEMOGLOBIN: 9.5 g/dL — AB (ref 13.0–17.0)
MCH: 27.1 pg (ref 26.0–34.0)
MCHC: 33.5 g/dL (ref 30.0–36.0)
MCV: 80.9 fL (ref 78.0–100.0)
Platelets: 399 10*3/uL (ref 150–400)
RBC: 3.51 MIL/uL — ABNORMAL LOW (ref 4.22–5.81)
RDW: 13.1 % (ref 11.5–15.5)
WBC: 11.5 10*3/uL — ABNORMAL HIGH (ref 4.0–10.5)

## 2016-05-02 LAB — GLUCOSE, CAPILLARY: Glucose-Capillary: 292 mg/dL — ABNORMAL HIGH (ref 65–99)

## 2016-05-02 MED ORDER — DIAZEPAM 5 MG PO TABS: 5.0000 mg | ORAL_TABLET | Freq: Four times a day (QID) | ORAL | Status: DC | PRN

## 2016-05-02 MED ORDER — FERROUS SULFATE 325 (65 FE) MG PO TABS: 325.0000 mg | ORAL_TABLET | Freq: Three times a day (TID) | ORAL | Status: DC

## 2016-05-02 MED ORDER — ACETAMINOPHEN 500 MG PO TABS: 1000.0000 mg | ORAL_TABLET | Freq: Three times a day (TID) | ORAL | Status: DC

## 2016-05-02 MED ORDER — DOXYCYCLINE HYCLATE 100 MG PO TABS: 100.0000 mg | ORAL_TABLET | Freq: Two times a day (BID) | ORAL | Status: DC

## 2016-05-02 MED ORDER — CELECOXIB 200 MG PO CAPS: 200.0000 mg | ORAL_CAPSULE | Freq: Two times a day (BID) | ORAL | Status: DC

## 2016-05-02 MED ORDER — CIPROFLOXACIN HCL 750 MG PO TABS
750.0000 mg | ORAL_TABLET | Freq: Two times a day (BID) | ORAL | Status: DC
  Administered 2016-05-02: 750 mg via ORAL
  Filled 2016-05-02 (×3): qty 1

## 2016-05-02 MED ORDER — OXYCODONE HCL 5 MG PO TABS: 5.0000 mg | ORAL_TABLET | ORAL | Status: DC | PRN

## 2016-05-02 MED ORDER — ASPIRIN 325 MG PO TBEC: 325.0000 mg | DELAYED_RELEASE_TABLET | Freq: Two times a day (BID) | ORAL | Status: AC

## 2016-05-02 MED ORDER — CIPROFLOXACIN HCL 750 MG PO TABS: 750.0000 mg | ORAL_TABLET | Freq: Two times a day (BID) | ORAL | Status: DC

## 2016-05-02 MED ORDER — DOXYCYCLINE HYCLATE 100 MG PO TABS
100.0000 mg | ORAL_TABLET | Freq: Two times a day (BID) | ORAL | Status: DC
Start: 2016-05-02 — End: 2016-05-02
  Administered 2016-05-02: 100 mg via ORAL
  Filled 2016-05-02 (×2): qty 1

## 2016-05-02 NOTE — Discharge Summary (Signed)
Physician Discharge Summary  Patient ID: David Mathews MRN: [id number] DOB/AGE: [birth year] 74 y.o.  Admit date: 05/01/2016 Discharge date: 05/02/2016   Procedures:  Procedure(s) (LRB): TOTAL KNEE REVISION REIMPLANTATION (Right)  Attending Physician:  Dr. 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 Hypothyroidism   . Diabetes mellitus without complication (HCC)   . Arthritis   . Infected prosthetic knee joint, right unicompartmental knee  06/14/2015  . Postoperative wound infection 07/19/2015  . MSSA (methicillin susceptible Staphylococcus aureus) infection 07/19/2015  . Neuromuscular disorder (HCC)     neuropathy FINGERS TIPS LEFT HAND, AND FEET  . BPH (benign prostatic hyperplasia)   . Complication of anesthesia February 28, 2016    "PT STATES HARD TO PUT TO SLEEP WITH LAST SURGERY"  . PONV (postoperative nausea and vomiting)     ether    HPI:    Pt is a 75 y.o. male who had an infected left UKR resection on 02/28/2016. A antibiotic spacer was placed and the patient has been receiving IV antibiotics. He has been followed by hi ID doctor as well. He has been taking his IV antibiotics for 6 weeks and is now off of them. Labs were obtained recently by ID and these will be used to determined how the patient is doing. We have discussed various possibilities with regards to the next surgery. He knows that out intention is to place an TKA in the left knee, but also knows that if there is infection present that he will need another antibiotic space and again go through IV antibiotics. He is anxious about the possibilities of having another antibiotic spacer, but understands the plan. Risks, benefits and expectations were discussed with the patient. Patient understand the risks,  benefits and expectations and wishes to proceed with surgery.   PCP: [name]   Discharged Condition: good  Hospital Course:  Patient underwent the above stated procedure on 05/01/2016. Patient tolerated the procedure well and brought to the recovery room in good condition and subsequently to the floor.  POD #1 BP: 108/61 ; Pulse: 86 ; Temp: 98.3 F (36.8 C) ; Resp: 18 Patient reports pain as mild to moderate but comfortable, already having eaten breakfast waiting for therapist. Ignacia Bayley to get home, get going again. Reviewed intra-operative findings. Neurovascular intact and incision: dressing C/D/I  LABS  Basename    HGB     9.5  HCT     28.4    Discharge Exam: General appearance: alert, cooperative and no distress Extremities: Homans sign is negative, no sign of DVT, no edema, redness or tenderness in the calves or thighs and no ulcers, gangrene or trophic changes  Disposition: Home with follow up in 2 weeks   Follow-up Information    Follow up with Shelda Pal, MD. Schedule an appointment as soon as possible for a visit in 2 weeks.   Specialty:  Orthopedic Surgery   Contact information:   [address] [place] [address] [phone]       Follow up with [facility].   Why:  home health physical therapy   Contact information:   [address] [place] [address] [phone]       Discharge Instructions    Call MD / Call 911  Complete by:  As directed   If you experience chest pain or shortness of breath, CALL 911 and be transported to the hospital emergency room.  If you develope a fever above 101 F, pus (white drainage) or increased drainage or redness at the wound, or calf pain, call your surgeon's office.     Change dressing    Complete by:  As directed   Maintain surgical dressing until follow up in the clinic. If the edges start to pull up, may reinforce with tape. If the dressing is no longer working,  may remove and cover with gauze and tape, but must keep the area dry and clean.  Call with any questions or concerns.     Constipation Prevention    Complete by:  As directed   Drink plenty of fluids.  Prune juice may be helpful.  You may use a stool softener, such as Colace (over the counter) 100 mg twice a day.  Use MiraLax (over the counter) for constipation as needed.     Diet - low sodium heart healthy    Complete by:  As directed      Discharge instructions    Complete by:  As directed   Maintain surgical dressing until follow up in the clinic. If the edges start to pull up, may reinforce with tape. If the dressing is no longer working, may remove and cover with gauze and tape, but must keep the area dry and clean.  [facility] Call with any questions or concerns.     Increase activity slowly as tolerated    Complete by:  As directed   Weight bearing as tolerated with assist device (walker, cane, etc) as directed, use it as long as suggested by your surgeon or therapist, typically at least 4-6 weeks.     TED hose    Complete by:  As directed   Use stockings (TED hose) for 2 weeks on both leg(s).  You may remove them at night for sleeping.             Medication List    STOP taking these medications        tiZANidine 4 MG tablet  Commonly known as:  ZANAFLEX     vancomycin 1,500 mg in sodium chloride 0.9 % 250 mL      TAKE these medications        acetaminophen 500 MG tablet  Commonly known as:  TYLENOL  Take 2 tablets (1,000 mg total) by mouth every 8 (eight) hours.     aspirin 325 MG EC tablet  Take 1 tablet (325 mg total) by mouth 2 (two) times daily.     celecoxib 200 MG capsule  Commonly known as:  CELEBREX  Take 1 capsule (200 mg total) by mouth every 12 (twelve) hours.     ciprofloxacin 750 MG tablet  Commonly known as:  CIPRO  Take 1 tablet (750 mg total) by mouth 2 (two) times daily.     diazepam 5 MG tablet  Commonly  known as:  VALIUM  Take 1 tablet (5 mg total) by mouth every 6 (six) hours as needed for muscle spasms.     docusate sodium 100 MG capsule  Commonly known as:  COLACE  Take 1 capsule (100 mg total) by mouth 2 (two) times daily.     doxycycline 100 MG tablet  Commonly known as:  VIBRA-TABS  Take 1 tablet (100 mg total) by mouth every  12 (twelve) hours.     ferrous sulfate 325 (65 FE) MG tablet  Take 1 tablet (325 mg total) by mouth 3 (three) times daily after meals.     finasteride 5 MG tablet  Commonly known as:  PROSCAR  Take 5 mg by mouth every evening.     fluconazole 200 MG tablet  Commonly known as:  DIFLUCAN  Take 1 tablet (200 mg total) by mouth daily.     fluticasone 50 MCG/ACT nasal spray  Commonly known as:  FLONASE  Place 1 spray into both nostrils daily as needed for allergies.     Insulin Glargine 100 UNIT/ML Solostar Pen  Commonly known as:  LANTUS SOLOSTAR  Inject 55 Units into the skin daily. Per sliding scale     levothyroxine 88 MCG tablet  Commonly known as:  SYNTHROID, LEVOTHROID  Take 88 mcg by mouth daily before breakfast.     lisinopril-hydrochlorothiazide 20-25 MG tablet  Commonly known as:  PRINZIDE,ZESTORETIC  Take 1 tablet by mouth every morning.     Loratadine 10 MG Caps  Take 10 mg by mouth every evening.     MAGNESIUM PO  Take 1 tablet by mouth daily.     metFORMIN 1000 MG tablet  Commonly known as:  GLUCOPHAGE  Take 1,000 mg by mouth 2 (two) times daily with a meal.     MULTIVITAMIN PO  Take 1 tablet by mouth daily.     NOVOLOG FLEXPEN 100 UNIT/ML FlexPen  Generic drug:  insulin aspart  Inject 10-15 Units into the skin 3 (three) times daily with meals. Use a sliding scale.     OVER THE COUNTER MEDICATION  Take 565 mg by mouth daily. Potassium      oxyCODONE 5 MG immediate release tablet  Commonly known as:  Oxy IR/ROXICODONE  Take 1-3 tablets (5-15 mg total) by mouth every 4 (four) hours as needed for severe pain.      polyethylene glycol packet  Commonly known as:  MIRALAX / GLYCOLAX  Take 17 g by mouth 2 (two) times daily.     pregabalin 225 MG capsule  Commonly known as:  LYRICA  Take 225 mg by mouth 2 (two) times daily.     RAPAFLO 8 MG Caps capsule  Generic drug:  silodosin  Take 8 mg by mouth daily with breakfast.     simvastatin 20 MG tablet  Commonly known as:  ZOCOR  Take 20 mg by mouth every evening.     VITAMIN B 12 PO  Take 1 tablet by mouth daily.     VITAMIN D PO  Take 1 tablet by mouth daily.         Signed: Anastasio Auerbach.    PA-C  05/02/2016, 2:59 PM

## 2016-05-02 NOTE — Progress Notes (Signed)
Assessment unchanged. Pt verbalized understanding of dc instructions through teach back including follow up care and when to call the doctor. Prescriptions given as provided by MD. Discharged via wc to front entrance to meet awaiting vehicle to carry home. Accompanied by son and NT.

## 2016-05-02 NOTE — Progress Notes (Signed)
Occupational Therapy Evaluation Patient Details Name: David FlorenceRussell F Empey MRN: [id number] DOB: 10/05/1941 Today's Date: 05/02/2016    History of Present Illness s/p revision UKR spacer to  R TKA   Clinical Impression   All OT education completed and pt questions answered. No further OT needs; will sign off.    Follow Up Recommendations  No OT follow up;Supervision - Intermittent    Equipment Recommendations  None recommended by OT    Recommendations for Other Services       Precautions / Restrictions Precautions Precautions: Fall Restrictions Weight Bearing Restrictions: No RLE Weight Bearing: Weight bearing as tolerated      Mobility Bed Mobility               General bed mobility comments: NT in chair  Transfers Overall transfer level: Needs assistance Equipment used: Rolling walker (2 wheeled) Transfers: Sit to/from Stand Sit to Stand: Supervision         General transfer comment: for safety, pt self corrects for proper hand placemnt    Balance                                         ADL Overall ADL's : Needs assistance/impaired Eating/Feeding: Independent;Sitting   Grooming: Supervision/safety;Standing   Upper Body Bathing: Set up;Sitting   Lower Body Bathing: Minimal assistance;Sit to/from stand   Upper Body Dressing : Set up;Sitting   Lower Body Dressing: Minimal assistance;Sit to/from stand   Toilet Transfer: Supervision/safety;Ambulation;BSC;RW   Toileting- Clothing Manipulation and Hygiene: Supervision/safety;Sit to/from stand       Functional mobility during ADLs: Modified independent;Rolling walker General ADL Comments: Reviewed ADL techniques with patient including LB dressing techniques and walk-in shower transfer technique.     Vision     Perception     Praxis      Pertinent Vitals/Pain Pain Assessment: 0-10 Pain Score: 2  Pain Location: R knee Pain Descriptors / Indicators: Sore Pain  Intervention(s): Limited activity within patient's tolerance;Monitored during session;Repositioned     Hand Dominance Right   Extremity/Trunk Assessment Upper Extremity Assessment Upper Extremity Assessment: Overall WFL for tasks assessed   Lower Extremity Assessment Lower Extremity Assessment: Defer to PT evaluation        Communication Communication Communication: No difficulties   Cognition Arousal/Alertness: Awake/alert Behavior During Therapy: WFL for tasks assessed/performed Overall Cognitive Status: Within Functional Limits for tasks assessed                     General Comments       Exercises       Shoulder Instructions      Home Living Family/patient expects to be discharged to:: Private residence Living Arrangements: Children Available Help at Discharge: Family;Available PRN/intermittently Type of Home: House Home Access: Stairs to enter Entrance Stairs-Number of Steps: ramp Entrance Stairs-Rails: Right;Left;Can reach both Home Layout: One level     Bathroom Shower/Tub: Producer, television/film/videoWalk-in shower   Bathroom Toilet: Handicapped height Bathroom Accessibility: Yes How Accessible: Accessible via walker Home Equipment: Walker - 2 wheels;Shower seat - built in;Crutches;Cane - single point;Toilet riser          Prior Functioning/Environment Level of Independence: Independent        Comments: still works    OT Diagnosis: Acute pain   OT Problem List: Decreased strength;Decreased range of motion;Pain   OT Treatment/Interventions:      OT Goals(Current goals can  be found in the care plan section) Acute Rehab OT Goals Patient Stated Goal: home today! OT Goal Formulation: All assessment and education complete, DC therapy  OT Frequency:     Barriers to D/C:            Co-evaluation              End of Session Equipment Utilized During Treatment: Rolling walker Nurse Communication: Mobility status  Activity Tolerance: Patient tolerated  treatment well Patient left: in chair;with call bell/phone within reach   Time: 1013-1033 OT Time Calculation (min): 20 min Charges:  OT General Charges $OT Visit: 1 Procedure OT Evaluation $OT Eval Low Complexity: 1 Procedure G-Codes:    ,  A 05-29-2016, 10:57 AM

## 2016-05-02 NOTE — Progress Notes (Signed)
Patient ID: David Mathews, male   DOB: 01/12/1941, 75 y.o.   MRN: [id number] Subjective: 1 Day Post-Op Procedure(s) (LRB): TOTAL KNEE REVISION REIMPLANTATION (Right)    Patient reports pain as mild to moderate but comfortable, already having eaten breakfast waiting for therapist.  Ignacia Bayleyager to get home, get going again.  Reviewed intra-operative findings  Objective:   VITALS:   Filed Vitals:   05/02/16 0218 05/02/16 0608  BP: 102/51 108/61  Pulse: 88 86  Temp: 98.2 F (36.8 C) 98.3 F (36.8 C)  Resp: 18 18    Neurovascular intact Incision: dressing C/D/I  LABS  Recent Labs  05/02/16 0415  HGB 9.5*  HCT 28.4*  WBC 11.5*  PLT 399     Recent Labs  05/02/16 0415  NA 123*  K 4.1  BUN 15  CREATININE 0.85  GLUCOSE 342*    No results for input(s): LABPT, INR in the last 72 hours.   Assessment/Plan: 1 Day Post-Op Procedure(s) (LRB): TOTAL KNEE REVISION REIMPLANTATION (Right)   Advance diet Up with therapy Discharge home with home health   RTC in 2 weeks Given infection history and per our discussions I will place him on PO antibiotics for 3-6 months as tolerable (Cipro and Doxy)

## 2016-05-02 NOTE — Evaluation (Signed)
Physical Therapy Evaluation Patient Details Name: David Mathews MRN: [id number] DOB: 1941/09/25 Today's Date: 05/02/2016   History of Present Illness  s/p revision UKR spacer to  R TKA  Clinical Impression  Pt son present for session; pt familiar to me from previous admissions; pt has ramp at home, amb 37' with RW and supervision; supportive son at home, pt declines HEP--reports he still has TKA-HEP from last UKR and will perform ex's at home; no further needs this venue; recommend HHPT    Follow Up Recommendations Home health PT;Supervision - Intermittent    Equipment Recommendations  None recommended by PT    Recommendations for Other Services       Precautions / Restrictions Precautions Precautions: Fall Restrictions Weight Bearing Restrictions: No RLE Weight Bearing: Weight bearing as tolerated      Mobility  Bed Mobility               General bed mobility comments: NT in chair  Transfers Overall transfer level: Needs assistance Equipment used: Rolling walker (2 wheeled) Transfers: Sit to/from Stand Sit to Stand: Supervision         General transfer comment: for safety, pt self corrects for proper hand placemnt  Ambulation/Gait   Ambulation Distance (Feet): 260 Feet Assistive device: Rolling walker (2 wheeled) Gait Pattern/deviations: Step-through pattern;Trunk flexed     General Gait Details: cues for posture  Stairs            Wheelchair Mobility    Modified Rankin (Stroke Patients Only)       Balance Overall balance assessment: Needs assistance   Sitting balance-Leahy Scale: Good       Standing balance-Leahy Scale: Fair                               Pertinent Vitals/Pain Pain Assessment: 0-10 Pain Score: 2  Pain Location: right knee Pain Descriptors / Indicators: Dull Pain Intervention(s): Limited activity within patient's tolerance;Monitored during session;Repositioned;Premedicated before session     Home Living Family/patient expects to be discharged to:: Private residence Living Arrangements: Children Available Help at Discharge: Family;Available PRN/intermittently Type of Home: House Home Access: Stairs to enter   Entrance Stairs-Number of Steps: ramp   Home Equipment: Walker - 2 wheels;Shower seat - built in;Crutches;Cane - single point      Prior Function Level of Independence: Independent               Hand Dominance        Extremity/Trunk Assessment   Upper Extremity Assessment: Defer to OT evaluation;Overall WFL for tasks assessed           Lower Extremity Assessment: RLE deficits/detail RLE Deficits / Details: active flexion to 90*; ankle WFL; hip flexion with knee extension 2+/5       Communication   Communication: No difficulties  Cognition Arousal/Alertness: Awake/alert Behavior During Therapy: WFL for tasks assessed/performed Overall Cognitive Status: Within Functional Limits for tasks assessed                      General Comments      Exercises Total Joint Exercises Ankle Circles/Pumps: AROM;Both (pt reports IND with  HEp/still has at home from previous surgeries)      Assessment/Plan    PT Assessment All further PT needs can be met in the next venue of care  PT Diagnosis Difficulty walking   PT Problem List    PT Treatment  Interventions     PT Goals (Current goals can be found in the Care Plan section) Acute Rehab PT Goals Patient Stated Goal: home today! PT Goal Formulation: All assessment and education complete, DC therapy    Frequency     Barriers to discharge        Co-evaluation               End of Session   Activity Tolerance: Patient tolerated treatment well Patient left: in chair;with call bell/phone within reach;with family/visitor present Nurse Communication: Mobility status         Time: 3007-6226 PT Time Calculation (min) (ACUTE ONLY): 23 min   Charges:   PT Evaluation $PT Eval  Low Complexity: 1 Procedure PT Treatments $Gait Training: 8-22 mins   PT G Codes:        , 24-May-2016, 10:38 AM

## 2016-05-02 NOTE — Care Management Note (Addendum)
Case Management Note  Patient Details  Name: David Mathews MRN: [id number] Date of Birth: Apr 07, 1941  Subjective/Objective:                  TOTAL KNEE REVISION REIMPLANTATION (Right) Action/Plan: Discharge planning Expected Discharge Date:  05/02/16               Expected Discharge Plan:  [place]  In-House Referral:     Discharge planning Services  CM Consult  Post Acute Care Choice:  Home Health Choice offered to:  Patient, Adult Children  DME Arranged:  N/A DME Agency:  NA  HH Arranged:  PT HH Agency:  [place]  Status of Service:  Completed, signed off  Medicare Important Message Given:    Date Medicare IM Given:    Medicare IM give by:    Date Additional Medicare IM Given:    Additional Medicare Important Message give by:     If discussed at [place] of Stay Meetings, dates discussed:    Additional Comments: CM met with pt and son of pt in room to confirm [facility] as their Home health agency; pt's son confirms.   [name] states pt still has all DME and no additional DME is needed.  NO other CM needs were communicated. Dellie Catholic, RN 05/02/2016, 10:41 AM

## 2016-05-05 DIAGNOSIS — E119 Type 2 diabetes mellitus without complications: Secondary | ICD-10-CM | POA: Diagnosis not present

## 2016-05-05 DIAGNOSIS — Z96651 Presence of right artificial knee joint: Secondary | ICD-10-CM | POA: Diagnosis not present

## 2016-05-15 DIAGNOSIS — Z471 Aftercare following joint replacement surgery: Secondary | ICD-10-CM | POA: Diagnosis not present

## 2016-05-15 DIAGNOSIS — Z96651 Presence of right artificial knee joint: Secondary | ICD-10-CM | POA: Diagnosis not present

## 2016-05-16 DIAGNOSIS — Z96651 Presence of right artificial knee joint: Secondary | ICD-10-CM | POA: Diagnosis not present

## 2016-05-16 DIAGNOSIS — R609 Edema, unspecified: Secondary | ICD-10-CM | POA: Diagnosis not present

## 2016-05-16 DIAGNOSIS — M179 Osteoarthritis of knee, unspecified: Secondary | ICD-10-CM | POA: Diagnosis not present

## 2016-05-16 DIAGNOSIS — E663 Overweight: Secondary | ICD-10-CM | POA: Diagnosis not present

## 2016-05-16 DIAGNOSIS — Z09 Encounter for follow-up examination after completed treatment for conditions other than malignant neoplasm: Secondary | ICD-10-CM | POA: Diagnosis not present

## 2016-05-16 DIAGNOSIS — E1140 Type 2 diabetes mellitus with diabetic neuropathy, unspecified: Secondary | ICD-10-CM | POA: Diagnosis not present

## 2016-05-16 DIAGNOSIS — M159 Polyosteoarthritis, unspecified: Secondary | ICD-10-CM | POA: Diagnosis not present

## 2016-05-16 DIAGNOSIS — E039 Hypothyroidism, unspecified: Secondary | ICD-10-CM | POA: Diagnosis not present

## 2016-05-16 DIAGNOSIS — Z6827 Body mass index (BMI) 27.0-27.9, adult: Secondary | ICD-10-CM | POA: Diagnosis not present

## 2016-05-16 DIAGNOSIS — Z79899 Other long term (current) drug therapy: Secondary | ICD-10-CM | POA: Diagnosis not present

## 2016-05-25 DIAGNOSIS — R531 Weakness: Secondary | ICD-10-CM | POA: Diagnosis not present

## 2016-05-25 DIAGNOSIS — Z96651 Presence of right artificial knee joint: Secondary | ICD-10-CM | POA: Diagnosis not present

## 2016-05-30 DIAGNOSIS — R531 Weakness: Secondary | ICD-10-CM | POA: Diagnosis not present

## 2016-05-30 DIAGNOSIS — Z96651 Presence of right artificial knee joint: Secondary | ICD-10-CM | POA: Diagnosis not present

## 2016-06-01 DIAGNOSIS — Z96651 Presence of right artificial knee joint: Secondary | ICD-10-CM | POA: Diagnosis not present

## 2016-06-01 DIAGNOSIS — R531 Weakness: Secondary | ICD-10-CM | POA: Diagnosis not present

## 2016-06-06 DIAGNOSIS — R531 Weakness: Secondary | ICD-10-CM | POA: Diagnosis not present

## 2016-06-06 DIAGNOSIS — Z96651 Presence of right artificial knee joint: Secondary | ICD-10-CM | POA: Diagnosis not present

## 2016-06-08 DIAGNOSIS — R531 Weakness: Secondary | ICD-10-CM | POA: Diagnosis not present

## 2016-06-08 DIAGNOSIS — Z96651 Presence of right artificial knee joint: Secondary | ICD-10-CM | POA: Diagnosis not present

## 2016-06-12 DIAGNOSIS — Z96651 Presence of right artificial knee joint: Secondary | ICD-10-CM | POA: Diagnosis not present

## 2016-06-12 DIAGNOSIS — R531 Weakness: Secondary | ICD-10-CM | POA: Diagnosis not present

## 2016-06-13 DIAGNOSIS — E162 Hypoglycemia, unspecified: Secondary | ICD-10-CM | POA: Diagnosis not present

## 2016-06-13 DIAGNOSIS — E1165 Type 2 diabetes mellitus with hyperglycemia: Secondary | ICD-10-CM | POA: Diagnosis not present

## 2016-06-13 DIAGNOSIS — Z79899 Other long term (current) drug therapy: Secondary | ICD-10-CM | POA: Diagnosis not present

## 2016-06-13 DIAGNOSIS — Z794 Long term (current) use of insulin: Secondary | ICD-10-CM | POA: Diagnosis not present

## 2016-06-15 DIAGNOSIS — R531 Weakness: Secondary | ICD-10-CM | POA: Diagnosis not present

## 2016-06-15 DIAGNOSIS — Z96651 Presence of right artificial knee joint: Secondary | ICD-10-CM | POA: Diagnosis not present

## 2016-06-16 DIAGNOSIS — M25551 Pain in right hip: Secondary | ICD-10-CM | POA: Diagnosis not present

## 2016-06-16 DIAGNOSIS — Z471 Aftercare following joint replacement surgery: Secondary | ICD-10-CM | POA: Diagnosis not present

## 2016-06-16 DIAGNOSIS — T8453XD Infection and inflammatory reaction due to internal right knee prosthesis, subsequent encounter: Secondary | ICD-10-CM | POA: Diagnosis not present

## 2016-06-16 DIAGNOSIS — M5136 Other intervertebral disc degeneration, lumbar region: Secondary | ICD-10-CM | POA: Diagnosis not present

## 2016-06-19 DIAGNOSIS — R531 Weakness: Secondary | ICD-10-CM | POA: Diagnosis not present

## 2016-06-19 DIAGNOSIS — Z96651 Presence of right artificial knee joint: Secondary | ICD-10-CM | POA: Diagnosis not present

## 2016-07-11 DIAGNOSIS — Z96651 Presence of right artificial knee joint: Secondary | ICD-10-CM | POA: Diagnosis not present

## 2016-07-11 DIAGNOSIS — R269 Unspecified abnormalities of gait and mobility: Secondary | ICD-10-CM | POA: Diagnosis not present

## 2016-07-11 DIAGNOSIS — R531 Weakness: Secondary | ICD-10-CM | POA: Diagnosis not present

## 2016-07-11 DIAGNOSIS — M545 Low back pain: Secondary | ICD-10-CM | POA: Diagnosis not present

## 2016-07-11 DIAGNOSIS — G8929 Other chronic pain: Secondary | ICD-10-CM | POA: Diagnosis not present

## 2016-07-17 DIAGNOSIS — Z794 Long term (current) use of insulin: Secondary | ICD-10-CM | POA: Diagnosis not present

## 2016-07-17 DIAGNOSIS — E162 Hypoglycemia, unspecified: Secondary | ICD-10-CM | POA: Diagnosis not present

## 2016-07-17 DIAGNOSIS — E1165 Type 2 diabetes mellitus with hyperglycemia: Secondary | ICD-10-CM | POA: Diagnosis not present

## 2016-07-25 DIAGNOSIS — M545 Low back pain: Secondary | ICD-10-CM | POA: Diagnosis not present

## 2016-07-25 DIAGNOSIS — R29898 Other symptoms and signs involving the musculoskeletal system: Secondary | ICD-10-CM | POA: Diagnosis not present

## 2016-07-25 DIAGNOSIS — Z96651 Presence of right artificial knee joint: Secondary | ICD-10-CM | POA: Diagnosis not present

## 2016-07-25 DIAGNOSIS — R531 Weakness: Secondary | ICD-10-CM | POA: Diagnosis not present

## 2016-07-25 DIAGNOSIS — R269 Unspecified abnormalities of gait and mobility: Secondary | ICD-10-CM | POA: Diagnosis not present

## 2016-07-25 DIAGNOSIS — G8929 Other chronic pain: Secondary | ICD-10-CM | POA: Diagnosis not present

## 2016-07-27 DIAGNOSIS — Z96651 Presence of right artificial knee joint: Secondary | ICD-10-CM | POA: Diagnosis not present

## 2016-07-27 DIAGNOSIS — R531 Weakness: Secondary | ICD-10-CM | POA: Diagnosis not present

## 2016-07-27 DIAGNOSIS — M545 Low back pain: Secondary | ICD-10-CM | POA: Diagnosis not present

## 2016-07-27 DIAGNOSIS — G8929 Other chronic pain: Secondary | ICD-10-CM | POA: Diagnosis not present

## 2016-07-27 DIAGNOSIS — R29898 Other symptoms and signs involving the musculoskeletal system: Secondary | ICD-10-CM | POA: Diagnosis not present

## 2016-07-27 DIAGNOSIS — R269 Unspecified abnormalities of gait and mobility: Secondary | ICD-10-CM | POA: Diagnosis not present

## 2016-08-01 DIAGNOSIS — R269 Unspecified abnormalities of gait and mobility: Secondary | ICD-10-CM | POA: Diagnosis not present

## 2016-08-01 DIAGNOSIS — R29898 Other symptoms and signs involving the musculoskeletal system: Secondary | ICD-10-CM | POA: Diagnosis not present

## 2016-08-01 DIAGNOSIS — G8929 Other chronic pain: Secondary | ICD-10-CM | POA: Diagnosis not present

## 2016-08-01 DIAGNOSIS — R531 Weakness: Secondary | ICD-10-CM | POA: Diagnosis not present

## 2016-08-01 DIAGNOSIS — Z96651 Presence of right artificial knee joint: Secondary | ICD-10-CM | POA: Diagnosis not present

## 2016-08-01 DIAGNOSIS — M545 Low back pain: Secondary | ICD-10-CM | POA: Diagnosis not present

## 2016-08-02 DIAGNOSIS — Z471 Aftercare following joint replacement surgery: Secondary | ICD-10-CM | POA: Diagnosis not present

## 2016-08-02 DIAGNOSIS — M4806 Spinal stenosis, lumbar region: Secondary | ICD-10-CM | POA: Diagnosis not present

## 2016-08-02 DIAGNOSIS — T8453XD Infection and inflammatory reaction due to internal right knee prosthesis, subsequent encounter: Secondary | ICD-10-CM | POA: Diagnosis not present

## 2016-08-03 DIAGNOSIS — R531 Weakness: Secondary | ICD-10-CM | POA: Diagnosis not present

## 2016-08-03 DIAGNOSIS — G8929 Other chronic pain: Secondary | ICD-10-CM | POA: Diagnosis not present

## 2016-08-03 DIAGNOSIS — R29898 Other symptoms and signs involving the musculoskeletal system: Secondary | ICD-10-CM | POA: Diagnosis not present

## 2016-08-03 DIAGNOSIS — M545 Low back pain: Secondary | ICD-10-CM | POA: Diagnosis not present

## 2016-08-03 DIAGNOSIS — R269 Unspecified abnormalities of gait and mobility: Secondary | ICD-10-CM | POA: Diagnosis not present

## 2016-08-03 DIAGNOSIS — Z96651 Presence of right artificial knee joint: Secondary | ICD-10-CM | POA: Diagnosis not present

## 2016-08-07 ENCOUNTER — Ambulatory Visit
Admission: RE | Admit: 2016-08-07 | Discharge: 2016-08-07 | Disposition: A | Discharge status: Home / Self Care | Payer: Medicare Other | Source: Ambulatory Visit | Attending: Neurosurgery | Admitting: Neurosurgery

## 2016-08-07 ENCOUNTER — Other Ambulatory Visit: Payer: Self-pay | Admitting: Neurosurgery

## 2016-08-07 DIAGNOSIS — M4806 Spinal stenosis, lumbar region: Secondary | ICD-10-CM | POA: Diagnosis not present

## 2016-08-07 DIAGNOSIS — M48061 Spinal stenosis, lumbar region without neurogenic claudication: Secondary | ICD-10-CM

## 2016-08-08 DIAGNOSIS — R531 Weakness: Secondary | ICD-10-CM | POA: Diagnosis not present

## 2016-08-08 DIAGNOSIS — Z96651 Presence of right artificial knee joint: Secondary | ICD-10-CM | POA: Diagnosis not present

## 2016-08-08 DIAGNOSIS — R269 Unspecified abnormalities of gait and mobility: Secondary | ICD-10-CM | POA: Diagnosis not present

## 2016-08-08 DIAGNOSIS — R29898 Other symptoms and signs involving the musculoskeletal system: Secondary | ICD-10-CM | POA: Diagnosis not present

## 2016-08-08 DIAGNOSIS — G8929 Other chronic pain: Secondary | ICD-10-CM | POA: Diagnosis not present

## 2016-08-08 DIAGNOSIS — M545 Low back pain: Secondary | ICD-10-CM | POA: Diagnosis not present

## 2016-08-16 DIAGNOSIS — G8929 Other chronic pain: Secondary | ICD-10-CM | POA: Diagnosis not present

## 2016-08-16 DIAGNOSIS — Z96651 Presence of right artificial knee joint: Secondary | ICD-10-CM | POA: Diagnosis not present

## 2016-08-16 DIAGNOSIS — Z1389 Encounter for screening for other disorder: Secondary | ICD-10-CM | POA: Diagnosis not present

## 2016-08-16 DIAGNOSIS — E1140 Type 2 diabetes mellitus with diabetic neuropathy, unspecified: Secondary | ICD-10-CM | POA: Diagnosis not present

## 2016-08-16 DIAGNOSIS — R269 Unspecified abnormalities of gait and mobility: Secondary | ICD-10-CM | POA: Diagnosis not present

## 2016-08-16 DIAGNOSIS — M545 Low back pain: Secondary | ICD-10-CM | POA: Diagnosis not present

## 2016-08-16 DIAGNOSIS — N401 Enlarged prostate with lower urinary tract symptoms: Secondary | ICD-10-CM | POA: Diagnosis not present

## 2016-08-16 DIAGNOSIS — Z23 Encounter for immunization: Secondary | ICD-10-CM | POA: Diagnosis not present

## 2016-08-16 DIAGNOSIS — R29898 Other symptoms and signs involving the musculoskeletal system: Secondary | ICD-10-CM | POA: Diagnosis not present

## 2016-08-16 DIAGNOSIS — E039 Hypothyroidism, unspecified: Secondary | ICD-10-CM | POA: Diagnosis not present

## 2016-08-16 DIAGNOSIS — M179 Osteoarthritis of knee, unspecified: Secondary | ICD-10-CM | POA: Diagnosis not present

## 2016-08-16 DIAGNOSIS — Z9181 History of falling: Secondary | ICD-10-CM | POA: Diagnosis not present

## 2016-08-16 DIAGNOSIS — Z79899 Other long term (current) drug therapy: Secondary | ICD-10-CM | POA: Diagnosis not present

## 2016-08-16 DIAGNOSIS — R531 Weakness: Secondary | ICD-10-CM | POA: Diagnosis not present

## 2016-08-16 DIAGNOSIS — Z125 Encounter for screening for malignant neoplasm of prostate: Secondary | ICD-10-CM | POA: Diagnosis not present

## 2016-08-16 DIAGNOSIS — Z6826 Body mass index (BMI) 26.0-26.9, adult: Secondary | ICD-10-CM | POA: Diagnosis not present

## 2016-08-16 DIAGNOSIS — R609 Edema, unspecified: Secondary | ICD-10-CM | POA: Diagnosis not present

## 2016-08-17 ENCOUNTER — Other Ambulatory Visit: Payer: Self-pay | Admitting: Neurosurgery

## 2016-08-17 DIAGNOSIS — M4806 Spinal stenosis, lumbar region: Secondary | ICD-10-CM | POA: Diagnosis not present

## 2016-09-01 ENCOUNTER — Encounter (HOSPITAL_COMMUNITY)
Admission: RE | Admit: 2016-09-01 | Discharge: 2016-09-01 | Disposition: A | Discharge status: Home / Self Care | Payer: Medicare Other | Source: Ambulatory Visit | Attending: Neurosurgery | Admitting: Neurosurgery

## 2016-09-01 ENCOUNTER — Encounter (HOSPITAL_COMMUNITY): Payer: Self-pay

## 2016-09-01 DIAGNOSIS — I10 Essential (primary) hypertension: Secondary | ICD-10-CM | POA: Diagnosis not present

## 2016-09-01 DIAGNOSIS — E119 Type 2 diabetes mellitus without complications: Secondary | ICD-10-CM | POA: Insufficient documentation

## 2016-09-01 HISTORY — DX: Unspecified intracranial injury with loss of consciousness of unspecified duration, initial encounter: S06.9X9A

## 2016-09-01 HISTORY — DX: Constipation, unspecified: K59.00

## 2016-09-01 LAB — CBC WITH DIFFERENTIAL/PLATELET
BASOS PCT: 1 %
Basophils Absolute: 0 10*3/uL (ref 0.0–0.1)
EOS ABS: 0.3 10*3/uL (ref 0.0–0.7)
Eosinophils Relative: 3 %
HCT: 39.4 % (ref 39.0–52.0)
Hemoglobin: 13.6 g/dL (ref 13.0–17.0)
Lymphocytes Relative: 17 %
Lymphs Abs: 1.4 10*3/uL (ref 0.7–4.0)
MCH: 28.7 pg (ref 26.0–34.0)
MCHC: 34.5 g/dL (ref 30.0–36.0)
MCV: 83.1 fL (ref 78.0–100.0)
MONO ABS: 0.5 10*3/uL (ref 0.1–1.0)
MONOS PCT: 7 %
Neutro Abs: 5.7 10*3/uL (ref 1.7–7.7)
Neutrophils Relative %: 72 %
PLATELETS: 244 10*3/uL (ref 150–400)
RBC: 4.74 MIL/uL (ref 4.22–5.81)
RDW: 15.9 % — AB (ref 11.5–15.5)
WBC: 7.8 10*3/uL (ref 4.0–10.5)

## 2016-09-01 LAB — BASIC METABOLIC PANEL
Anion gap: 8 (ref 5–15)
BUN: 13 mg/dL (ref 6–20)
CALCIUM: 9.7 mg/dL (ref 8.9–10.3)
CO2: 29 mmol/L (ref 22–32)
CREATININE: 0.81 mg/dL (ref 0.61–1.24)
Chloride: 93 mmol/L — ABNORMAL LOW (ref 101–111)
GLUCOSE: 218 mg/dL — AB (ref 65–99)
Potassium: 4.3 mmol/L (ref 3.5–5.1)
Sodium: 130 mmol/L — ABNORMAL LOW (ref 135–145)

## 2016-09-01 LAB — GLUCOSE, CAPILLARY: GLUCOSE-CAPILLARY: 215 mg/dL — AB (ref 65–99)

## 2016-09-01 LAB — TYPE AND SCREEN
ABO/RH(D): B POS
ANTIBODY SCREEN: NEGATIVE

## 2016-09-01 LAB — SURGICAL PCR SCREEN
MRSA, PCR: NEGATIVE
Staphylococcus aureus: NEGATIVE

## 2016-09-01 NOTE — Pre-Procedure Instructions (Addendum)
David Mathews  09/01/2016               Your procedure is scheduled on TUESDAY, OCTOBER 17.  Report to [facility] Admitting at 6:00 AM.   Call this number if you have problems the morning of surgery:[phone]   Remember:  Do not eat food or drink liquids after midnight Monday, October 16.  Take these medicines the morning of surgery with A SIP OF WATER: levothyroxine (SYNTHROID, LEVOTHROID), pregabalin (LYRICA).               May use Flonase Nasal Spray.  May take acetaminophen (TYLENOL) or oxyCODONE (OXY IR/ROXICODONE) if needed and you can tolerate it on an empty stomach.                   DO NOT take metFORMIN (GLUCOPHAGE)the morning of surgery.  How to Manage Your Diabetes Before and After Surgery  Why is it important to control my blood sugar before and after surgery? . Improving blood sugar levels before and after surgery helps healing and can limit problems. . A way of improving blood sugar control is eating a healthy diet by: o  Eating less sugar and carbohydrates o  Increasing activity/exercise o  Talking with your doctor about reaching your blood sugar goals . High blood sugars (greater than 180 mg/dL) can raise your risk of infections and slow your recovery, so you will need to focus on controlling your diabetes during the weeks before surgery. . Make sure that the doctor who takes care of your diabetes knows about your planned surgery including the date and location.  How do I manage my blood sugar before surgery? . Check your blood sugar at least 4 times a day, starting 2 days before surgery, to make sure that the level is not too high or low. o Check your blood sugar the morning of your surgery when you wake up and every 2 hours until you get to the Short Stay unit. . If your blood sugar is less than 70 mg/dL, you will need to treat for low blood sugar: o Do not take insulin. o Treat a low blood sugar (less than 70 mg/dL) with  cup of clear juice  (cranberry or apple), 4 glucose tablets, OR glucose gel. o Recheck blood sugar in 15 minutes after treatment (to make sure it is greater than 70 mg/dL). If your blood sugar is not greater than 70 mg/dL on recheck, call [phone] for further instructions. . Report your blood sugar to the short stay nurse when you get to Short Stay.  . If you are admitted to the hospital after surgery: o Your blood sugar will be checked by the staff and you will probably be given insulin after surgery (instead of oral diabetes medicines) to make sure you have good blood sugar levels. o The goal for blood sugar control after surgery is 80-180 mg/dL.  WHAT DO I DO ABOUT MY DIABETES MEDICATION?  [name] Do not take oral diabetes medicines (pills) the morning of surgery. .     . THE MORNING OF SURGERY, take 8 units of Lantus insulin.  . The day of surgery, do not take other diabetes injectables, including Byetta (exenatide), Bydureon (exenatide ER), Victoza (liraglutide), or Trulicity (dulaglutide).  . If your CBG is greater than 220 mg/dL, you may take  of your sliding scale (correction) dose of insulin.  Patient Signature:  Date:   Nurse Signature:  Date:  1 Week prior to surgery STOP taking Aspirin , Aspirin Products (Goody Powder, Excedrin Migraine), Ibuprofen (Advil), Naproxen (Aleve), Celebrex, Viatamins and Herbal Products (ie Fish Oil)   Do not wear jewelry, make-up or nail polish.  Do not wear lotions, powders, or perfumes, or deodorant.   Men may shave face and neck.  Do not bring valuables to the hospital.  [facility] is not responsible for any belongings or valuables.  Contacts, dentures or bridgework may not be worn into surgery.  Leave your suitcase in the car.  After surgery it may be brought to your room.  For patients admitted to the hospital, discharge time will be determined by your treatment team.  Special instructions: Review  [place] - Preparing For  Surgery.  Please read over the following fact sheets that you were given. [place]- Preparing For Surgery and Patient Instructions for Mupirocin Application, Incentive Spirometry, Pain Booklet

## 2016-09-04 MED ORDER — DEXAMETHASONE SODIUM PHOSPHATE 10 MG/ML IJ SOLN
10.0000 mg | INTRAMUSCULAR | Status: AC
  Administered 2016-09-05: 10 mg via INTRAVENOUS
  Filled 2016-09-04: qty 1

## 2016-09-04 MED ORDER — VANCOMYCIN HCL IN DEXTROSE 1-5 GM/200ML-% IV SOLN
1000.0000 mg | INTRAVENOUS | Status: AC
  Administered 2016-09-05: 1000 mg via INTRAVENOUS
  Filled 2016-09-04: qty 200

## 2016-09-05 ENCOUNTER — Inpatient Hospital Stay (HOSPITAL_COMMUNITY): Payer: Medicare Other

## 2016-09-05 ENCOUNTER — Inpatient Hospital Stay (HOSPITAL_COMMUNITY): Payer: Medicare Other | Admitting: Certified Registered Nurse Anesthetist

## 2016-09-05 ENCOUNTER — Inpatient Hospital Stay (HOSPITAL_COMMUNITY)
Admission: RE | Admit: 2016-09-05 | Discharge: 2016-09-06 | DRG: 454 | Disposition: A | Discharge status: Home / Self Care | Payer: Medicare Other | Source: Ambulatory Visit | Attending: Neurosurgery | Admitting: Neurosurgery

## 2016-09-05 ENCOUNTER — Encounter (HOSPITAL_COMMUNITY): Payer: Self-pay | Admitting: *Deleted

## 2016-09-05 ENCOUNTER — Encounter (HOSPITAL_COMMUNITY)
Admission: RE | Disposition: A | Discharge status: Home / Self Care | Payer: Self-pay | Source: Ambulatory Visit | Attending: Neurosurgery

## 2016-09-05 DIAGNOSIS — Z7984 Long term (current) use of oral hypoglycemic drugs: Secondary | ICD-10-CM | POA: Diagnosis not present

## 2016-09-05 DIAGNOSIS — M961 Postlaminectomy syndrome, not elsewhere classified: Secondary | ICD-10-CM | POA: Diagnosis not present

## 2016-09-05 DIAGNOSIS — Z87891 Personal history of nicotine dependence: Secondary | ICD-10-CM | POA: Diagnosis not present

## 2016-09-05 DIAGNOSIS — N400 Enlarged prostate without lower urinary tract symptoms: Secondary | ICD-10-CM | POA: Diagnosis present

## 2016-09-05 DIAGNOSIS — T84226A Displacement of internal fixation device of vertebrae, initial encounter: Secondary | ICD-10-CM | POA: Diagnosis not present

## 2016-09-05 DIAGNOSIS — Z79899 Other long term (current) drug therapy: Secondary | ICD-10-CM

## 2016-09-05 DIAGNOSIS — M5126 Other intervertebral disc displacement, lumbar region: Secondary | ICD-10-CM | POA: Diagnosis not present

## 2016-09-05 DIAGNOSIS — Z79891 Long term (current) use of opiate analgesic: Secondary | ICD-10-CM

## 2016-09-05 DIAGNOSIS — Y838 Other surgical procedures as the cause of abnormal reaction of the patient, or of later complication, without mention of misadventure at the time of the procedure: Secondary | ICD-10-CM | POA: Diagnosis present

## 2016-09-05 DIAGNOSIS — M48062 Spinal stenosis, lumbar region with neurogenic claudication: Secondary | ICD-10-CM | POA: Diagnosis present

## 2016-09-05 DIAGNOSIS — M963 Postlaminectomy kyphosis: Secondary | ICD-10-CM | POA: Diagnosis not present

## 2016-09-05 DIAGNOSIS — E039 Hypothyroidism, unspecified: Secondary | ICD-10-CM | POA: Diagnosis present

## 2016-09-05 DIAGNOSIS — M5116 Intervertebral disc disorders with radiculopathy, lumbar region: Secondary | ICD-10-CM | POA: Diagnosis not present

## 2016-09-05 DIAGNOSIS — Z96653 Presence of artificial knee joint, bilateral: Secondary | ICD-10-CM | POA: Diagnosis present

## 2016-09-05 DIAGNOSIS — Z880 Allergy status to penicillin: Secondary | ICD-10-CM

## 2016-09-05 DIAGNOSIS — Z794 Long term (current) use of insulin: Secondary | ICD-10-CM

## 2016-09-05 DIAGNOSIS — M40209 Unspecified kyphosis, site unspecified: Secondary | ICD-10-CM | POA: Diagnosis present

## 2016-09-05 DIAGNOSIS — E119 Type 2 diabetes mellitus without complications: Secondary | ICD-10-CM | POA: Diagnosis present

## 2016-09-05 DIAGNOSIS — Z419 Encounter for procedure for purposes other than remedying health state, unspecified: Secondary | ICD-10-CM

## 2016-09-05 DIAGNOSIS — M1990 Unspecified osteoarthritis, unspecified site: Secondary | ICD-10-CM | POA: Diagnosis not present

## 2016-09-05 DIAGNOSIS — M960 Pseudarthrosis after fusion or arthrodesis: Secondary | ICD-10-CM | POA: Diagnosis not present

## 2016-09-05 DIAGNOSIS — M4326 Fusion of spine, lumbar region: Secondary | ICD-10-CM | POA: Diagnosis not present

## 2016-09-05 LAB — GLUCOSE, CAPILLARY
GLUCOSE-CAPILLARY: 169 mg/dL — AB (ref 65–99)
GLUCOSE-CAPILLARY: 381 mg/dL — AB (ref 65–99)
Glucose-Capillary: 307 mg/dL — ABNORMAL HIGH (ref 65–99)
Glucose-Capillary: 262 mg/dL — ABNORMAL HIGH (ref 65–99)
Glucose-Capillary: 282 mg/dL — ABNORMAL HIGH (ref 65–99)

## 2016-09-05 SURGERY — POSTERIOR LUMBAR FUSION 2 LEVEL
Anesthesia: General | Site: Spine Lumbar

## 2016-09-05 SURGERY — Surgical Case
Anesthesia: *Unknown

## 2016-09-05 MED ORDER — CEFAZOLIN IN D5W 1 GM/50ML IV SOLN
1.0000 g | Freq: Three times a day (TID) | INTRAVENOUS | Status: AC
  Administered 2016-09-05 (×2): 1 g via INTRAVENOUS
  Filled 2016-09-05 (×2): qty 50

## 2016-09-05 MED ORDER — SODIUM CHLORIDE 0.9% FLUSH: 3.0000 mL | INTRAVENOUS | Status: DC | PRN

## 2016-09-05 MED ORDER — LISINOPRIL 20 MG PO TABS
20.0000 mg | ORAL_TABLET | Freq: Every day | ORAL | Status: DC
  Administered 2016-09-06: 20 mg via ORAL
  Filled 2016-09-05: qty 1

## 2016-09-05 MED ORDER — INSULIN ASPART 100 UNIT/ML ~~LOC~~ SOLN
SUBCUTANEOUS | Status: AC
  Filled 2016-09-05: qty 1

## 2016-09-05 MED ORDER — PROPOFOL 10 MG/ML IV BOLUS
INTRAVENOUS | Status: AC
  Filled 2016-09-05: qty 20

## 2016-09-05 MED ORDER — FENTANYL CITRATE (PF) 100 MCG/2ML IJ SOLN
INTRAMUSCULAR | Status: AC
  Filled 2016-09-05: qty 2

## 2016-09-05 MED ORDER — OXYCODONE-ACETAMINOPHEN 5-325 MG PO TABS
1.0000 | ORAL_TABLET | ORAL | Status: DC | PRN
  Administered 2016-09-05 – 2016-09-06 (×4): 2 via ORAL
  Filled 2016-09-05 (×4): qty 2

## 2016-09-05 MED ORDER — PHENOL 1.4 % MT LIQD
1.0000 | OROMUCOSAL | Status: DC | PRN
Start: 2016-09-05 — End: 2016-09-06

## 2016-09-05 MED ORDER — SIMVASTATIN 20 MG PO TABS
20.0000 mg | ORAL_TABLET | Freq: Every evening | ORAL | Status: DC
  Administered 2016-09-05: 20 mg via ORAL
  Filled 2016-09-05: qty 1

## 2016-09-05 MED ORDER — LIDOCAINE 2% (20 MG/ML) 5 ML SYRINGE
INTRAMUSCULAR | Status: AC
  Filled 2016-09-05: qty 5

## 2016-09-05 MED ORDER — INSULIN ASPART 100 UNIT/ML FLEXPEN
6.0000 [IU] | PEN_INJECTOR | Freq: Three times a day (TID) | SUBCUTANEOUS | Status: DC
Start: 2016-09-05 — End: 2016-09-05

## 2016-09-05 MED ORDER — ESMOLOL HCL 100 MG/10ML IV SOLN
INTRAVENOUS | Status: DC | PRN
  Administered 2016-09-05: 40 mg via INTRAVENOUS
  Administered 2016-09-05: 60 mg via INTRAVENOUS

## 2016-09-05 MED ORDER — MENTHOL 3 MG MT LOZG
1.0000 | LOZENGE | OROMUCOSAL | Status: DC | PRN
  Administered 2016-09-06: 3 mg via ORAL
  Filled 2016-09-05 (×2): qty 9

## 2016-09-05 MED ORDER — INSULIN ASPART 100 UNIT/ML ~~LOC~~ SOLN
8.0000 [IU] | Freq: Once | SUBCUTANEOUS | Status: AC
  Administered 2016-09-05: 8 [IU] via SUBCUTANEOUS

## 2016-09-05 MED ORDER — FERROUS SULFATE 325 (65 FE) MG PO TABS
325.0000 mg | ORAL_TABLET | Freq: Two times a day (BID) | ORAL | Status: DC
  Administered 2016-09-05 – 2016-09-06 (×2): 325 mg via ORAL
  Filled 2016-09-05 (×2): qty 1

## 2016-09-05 MED ORDER — DOXYCYCLINE HYCLATE 100 MG PO TABS
100.0000 mg | ORAL_TABLET | Freq: Two times a day (BID) | ORAL | Status: DC
Start: 2016-09-05 — End: 2016-09-06
  Administered 2016-09-05 – 2016-09-06 (×2): 100 mg via ORAL
  Filled 2016-09-05 (×3): qty 1

## 2016-09-05 MED ORDER — LORATADINE 10 MG PO TABS
10.0000 mg | ORAL_TABLET | Freq: Every evening | ORAL | Status: DC
  Administered 2016-09-05: 10 mg via ORAL
  Filled 2016-09-05: qty 1

## 2016-09-05 MED ORDER — ACETAMINOPHEN 10 MG/ML IV SOLN
INTRAVENOUS | Status: DC | PRN
  Administered 2016-09-05: 1000 mg via INTRAVENOUS

## 2016-09-05 MED ORDER — ROCURONIUM BROMIDE 100 MG/10ML IV SOLN
INTRAVENOUS | Status: DC | PRN
  Administered 2016-09-05: 20 mg via INTRAVENOUS
  Administered 2016-09-05 (×2): 50 mg via INTRAVENOUS
  Administered 2016-09-05: 10 mg via INTRAVENOUS
  Administered 2016-09-05: 20 mg via INTRAVENOUS

## 2016-09-05 MED ORDER — INSULIN GLARGINE 100 UNIT/ML ~~LOC~~ SOLN
16.0000 [IU] | Freq: Every day | SUBCUTANEOUS | Status: DC
  Administered 2016-09-06: 16 [IU] via SUBCUTANEOUS
  Filled 2016-09-05: qty 0.16

## 2016-09-05 MED ORDER — ACETAMINOPHEN 650 MG RE SUPP: 650.0000 mg | RECTAL | Status: DC | PRN

## 2016-09-05 MED ORDER — PHENYLEPHRINE 40 MCG/ML (10ML) SYRINGE FOR IV PUSH (FOR BLOOD PRESSURE SUPPORT)
PREFILLED_SYRINGE | INTRAVENOUS | Status: AC
  Filled 2016-09-05: qty 10

## 2016-09-05 MED ORDER — ROCURONIUM BROMIDE 10 MG/ML (PF) SYRINGE
PREFILLED_SYRINGE | INTRAVENOUS | Status: AC
  Filled 2016-09-05: qty 10

## 2016-09-05 MED ORDER — SUCCINYLCHOLINE CHLORIDE 200 MG/10ML IV SOSY
PREFILLED_SYRINGE | INTRAVENOUS | Status: AC
  Filled 2016-09-05: qty 10

## 2016-09-05 MED ORDER — LACTATED RINGERS IV SOLN
INTRAVENOUS | Status: DC | PRN
  Administered 2016-09-05 (×3): via INTRAVENOUS

## 2016-09-05 MED ORDER — OXYCODONE HCL 5 MG/5ML PO SOLN: 5.0000 mg | Freq: Once | ORAL | Status: DC | PRN

## 2016-09-05 MED ORDER — LIDOCAINE HCL (CARDIAC) 20 MG/ML IV SOLN
INTRAVENOUS | Status: DC | PRN
  Administered 2016-09-05: 80 mg via INTRAVENOUS

## 2016-09-05 MED ORDER — TAMSULOSIN HCL 0.4 MG PO CAPS
0.4000 mg | ORAL_CAPSULE | Freq: Every day | ORAL | Status: DC
  Administered 2016-09-05: 0.4 mg via ORAL
  Filled 2016-09-05: qty 1

## 2016-09-05 MED ORDER — EPHEDRINE 5 MG/ML INJ
INTRAVENOUS | Status: AC
  Filled 2016-09-05: qty 10

## 2016-09-05 MED ORDER — POLYETHYLENE GLYCOL 3350 17 G PO PACK: 17.0000 g | PACK | Freq: Two times a day (BID) | ORAL | Status: DC | PRN

## 2016-09-05 MED ORDER — ALBUMIN HUMAN 5 % IV SOLN
INTRAVENOUS | Status: DC | PRN
  Administered 2016-09-05 (×2): via INTRAVENOUS

## 2016-09-05 MED ORDER — FENTANYL CITRATE (PF) 100 MCG/2ML IJ SOLN
INTRAMUSCULAR | Status: DC | PRN
  Administered 2016-09-05 (×2): 50 ug via INTRAVENOUS
  Administered 2016-09-05: 100 ug via INTRAVENOUS
  Administered 2016-09-05 (×2): 50 ug via INTRAVENOUS
  Administered 2016-09-05: 100 ug via INTRAVENOUS
  Administered 2016-09-05 (×6): 50 ug via INTRAVENOUS

## 2016-09-05 MED ORDER — SUGAMMADEX SODIUM 200 MG/2ML IV SOLN
INTRAVENOUS | Status: AC
  Filled 2016-09-05: qty 2

## 2016-09-05 MED ORDER — CHLORHEXIDINE GLUCONATE CLOTH 2 % EX PADS: 6.0000 | MEDICATED_PAD | Freq: Once | CUTANEOUS | Status: DC

## 2016-09-05 MED ORDER — METFORMIN HCL 500 MG PO TABS
1000.0000 mg | ORAL_TABLET | Freq: Two times a day (BID) | ORAL | Status: DC
  Administered 2016-09-05 – 2016-09-06 (×2): 1000 mg via ORAL
  Filled 2016-09-05 (×2): qty 2

## 2016-09-05 MED ORDER — FENTANYL CITRATE (PF) 100 MCG/2ML IJ SOLN
INTRAMUSCULAR | Status: AC
  Filled 2016-09-05: qty 4

## 2016-09-05 MED ORDER — INSULIN ASPART 100 UNIT/ML ~~LOC~~ SOLN: 0.0000 [IU] | Freq: Three times a day (TID) | SUBCUTANEOUS | Status: DC

## 2016-09-05 MED ORDER — ONDANSETRON HCL 4 MG/2ML IJ SOLN
INTRAMUSCULAR | Status: DC | PRN
Start: 2016-09-05 — End: 2016-09-05
  Administered 2016-09-05: 4 mg via INTRAVENOUS

## 2016-09-05 MED ORDER — PROMETHAZINE HCL 25 MG/ML IJ SOLN: 6.2500 mg | INTRAMUSCULAR | Status: DC | PRN

## 2016-09-05 MED ORDER — SUGAMMADEX SODIUM 200 MG/2ML IV SOLN
INTRAVENOUS | Status: DC | PRN
  Administered 2016-09-05: 160 mg via INTRAVENOUS

## 2016-09-05 MED ORDER — HYDROCODONE-ACETAMINOPHEN 5-325 MG PO TABS: 1.0000 | ORAL_TABLET | ORAL | Status: DC | PRN

## 2016-09-05 MED ORDER — FENTANYL CITRATE (PF) 100 MCG/2ML IJ SOLN
25.0000 ug | INTRAMUSCULAR | Status: DC | PRN
  Administered 2016-09-05: 25 ug via INTRAVENOUS

## 2016-09-05 MED ORDER — SODIUM CHLORIDE 0.9 % IV SOLN
INTRAVENOUS | Status: DC | PRN
  Administered 2016-09-05: 12:00:00 via INTRAVENOUS

## 2016-09-05 MED ORDER — LACTATED RINGERS IV SOLN
INTRAVENOUS | Status: DC | PRN
  Administered 2016-09-05: 07:00:00 via INTRAVENOUS

## 2016-09-05 MED ORDER — OXYCODONE HCL 5 MG PO TABS: 5.0000 mg | ORAL_TABLET | Freq: Once | ORAL | Status: DC | PRN

## 2016-09-05 MED ORDER — INSULIN ASPART 100 UNIT/ML ~~LOC~~ SOLN
0.0000 [IU] | Freq: Three times a day (TID) | SUBCUTANEOUS | Status: DC
  Administered 2016-09-06: 8 [IU] via SUBCUTANEOUS

## 2016-09-05 MED ORDER — BUPIVACAINE HCL (PF) 0.25 % IJ SOLN
INTRAMUSCULAR | Status: AC
  Filled 2016-09-05: qty 30

## 2016-09-05 MED ORDER — LISINOPRIL-HYDROCHLOROTHIAZIDE 20-25 MG PO TABS: 1.0000 | ORAL_TABLET | Freq: Every morning | ORAL | Status: DC

## 2016-09-05 MED ORDER — LEVOTHYROXINE SODIUM 88 MCG PO TABS
88.0000 ug | ORAL_TABLET | Freq: Every day | ORAL | Status: DC
  Administered 2016-09-06: 88 ug via ORAL
  Filled 2016-09-05: qty 1

## 2016-09-05 MED ORDER — BACITRACIN 50000 UNITS IM SOLR
INTRAMUSCULAR | Status: DC | PRN
  Administered 2016-09-05: 500 mL

## 2016-09-05 MED ORDER — SODIUM CHLORIDE 0.9% FLUSH
3.0000 mL | Freq: Two times a day (BID) | INTRAVENOUS | Status: DC
  Administered 2016-09-05: 3 mL via INTRAVENOUS

## 2016-09-05 MED ORDER — DIAZEPAM 5 MG PO TABS
5.0000 mg | ORAL_TABLET | Freq: Four times a day (QID) | ORAL | Status: DC | PRN
  Administered 2016-09-05 – 2016-09-06 (×2): 5 mg via ORAL
  Filled 2016-09-05 (×2): qty 1

## 2016-09-05 MED ORDER — HYDROCHLOROTHIAZIDE 25 MG PO TABS
25.0000 mg | ORAL_TABLET | Freq: Every day | ORAL | Status: DC
  Administered 2016-09-06: 25 mg via ORAL
  Filled 2016-09-05: qty 1

## 2016-09-05 MED ORDER — FINASTERIDE 5 MG PO TABS
5.0000 mg | ORAL_TABLET | Freq: Every evening | ORAL | Status: DC
  Administered 2016-09-05: 5 mg via ORAL
  Filled 2016-09-05: qty 1

## 2016-09-05 MED ORDER — HYDROMORPHONE HCL 1 MG/ML IJ SOLN: 0.5000 mg | INTRAMUSCULAR | Status: DC | PRN

## 2016-09-05 MED ORDER — PREGABALIN 75 MG PO CAPS
225.0000 mg | ORAL_CAPSULE | Freq: Two times a day (BID) | ORAL | Status: DC
  Administered 2016-09-05 – 2016-09-06 (×2): 225 mg via ORAL
  Filled 2016-09-05 (×2): qty 3

## 2016-09-05 MED ORDER — CIPROFLOXACIN HCL 750 MG PO TABS
750.0000 mg | ORAL_TABLET | Freq: Two times a day (BID) | ORAL | Status: DC
  Administered 2016-09-05 – 2016-09-06 (×2): 750 mg via ORAL
  Filled 2016-09-05 (×3): qty 1

## 2016-09-05 MED ORDER — VANCOMYCIN HCL 1000 MG IV SOLR
INTRAVENOUS | Status: DC | PRN
  Administered 2016-09-05: 1000 mg via TOPICAL

## 2016-09-05 MED ORDER — ACETAMINOPHEN 325 MG PO TABS: 650.0000 mg | ORAL_TABLET | ORAL | Status: DC | PRN

## 2016-09-05 MED ORDER — DIPHENHYDRAMINE HCL 25 MG PO CAPS
25.0000 mg | ORAL_CAPSULE | Freq: Four times a day (QID) | ORAL | Status: DC | PRN
  Administered 2016-09-05: 25 mg via ORAL
  Filled 2016-09-05: qty 1

## 2016-09-05 MED ORDER — ROCURONIUM BROMIDE 10 MG/ML (PF) SYRINGE
PREFILLED_SYRINGE | INTRAVENOUS | Status: AC
  Filled 2016-09-05: qty 20

## 2016-09-05 MED ORDER — INSULIN ASPART 100 UNIT/ML ~~LOC~~ SOLN
0.0000 [IU] | Freq: Every day | SUBCUTANEOUS | Status: DC
  Administered 2016-09-05: 3 [IU] via SUBCUTANEOUS

## 2016-09-05 MED ORDER — ONDANSETRON HCL 4 MG/2ML IJ SOLN
4.0000 mg | INTRAMUSCULAR | Status: DC | PRN
Start: 2016-09-05 — End: 2016-09-06
  Administered 2016-09-06: 4 mg via INTRAVENOUS
  Filled 2016-09-05: qty 2

## 2016-09-05 MED ORDER — ESMOLOL HCL 100 MG/10ML IV SOLN
INTRAVENOUS | Status: AC
  Filled 2016-09-05: qty 10

## 2016-09-05 MED ORDER — VANCOMYCIN HCL 1000 MG IV SOLR
INTRAVENOUS | Status: AC
  Filled 2016-09-05: qty 1000

## 2016-09-05 MED ORDER — ONDANSETRON HCL 4 MG/2ML IJ SOLN
INTRAMUSCULAR | Status: AC
  Filled 2016-09-05: qty 2

## 2016-09-05 MED ORDER — SURGIFOAM 100 EX MISC
CUTANEOUS | Status: DC | PRN
  Administered 2016-09-05 (×2): 20 mL via TOPICAL

## 2016-09-05 MED ORDER — DOCUSATE SODIUM 100 MG PO CAPS
100.0000 mg | ORAL_CAPSULE | Freq: Two times a day (BID) | ORAL | Status: DC
  Administered 2016-09-05 – 2016-09-06 (×2): 100 mg via ORAL
  Filled 2016-09-05 (×2): qty 1

## 2016-09-05 MED ORDER — 0.9 % SODIUM CHLORIDE (POUR BTL) OPTIME
TOPICAL | Status: DC | PRN
  Administered 2016-09-05: 1000 mL

## 2016-09-05 MED ORDER — PHENYLEPHRINE HCL 10 MG/ML IJ SOLN
INTRAVENOUS | Status: DC | PRN
  Administered 2016-09-05: 20 ug/min via INTRAVENOUS

## 2016-09-05 MED ORDER — THROMBIN 20000 UNITS EX SOLR
CUTANEOUS | Status: AC
  Filled 2016-09-05: qty 20000

## 2016-09-05 MED ORDER — SUCCINYLCHOLINE CHLORIDE 20 MG/ML IJ SOLN
INTRAMUSCULAR | Status: DC | PRN
  Administered 2016-09-05: 110 mg via INTRAVENOUS

## 2016-09-05 MED ORDER — FLUTICASONE PROPIONATE 50 MCG/ACT NA SUSP: 1.0000 | Freq: Every day | NASAL | Status: DC | PRN

## 2016-09-05 MED ORDER — EPHEDRINE SULFATE 50 MG/ML IJ SOLN
INTRAMUSCULAR | Status: DC | PRN
  Administered 2016-09-05: 15 mg via INTRAVENOUS
  Administered 2016-09-05 (×3): 10 mg via INTRAVENOUS

## 2016-09-05 MED ORDER — ACETAMINOPHEN 10 MG/ML IV SOLN
INTRAVENOUS | Status: AC
  Filled 2016-09-05: qty 100

## 2016-09-05 MED ORDER — BUPIVACAINE HCL (PF) 0.25 % IJ SOLN
INTRAMUSCULAR | Status: DC | PRN
  Administered 2016-09-05: 30 mL

## 2016-09-05 MED ORDER — PROPOFOL 10 MG/ML IV BOLUS
INTRAVENOUS | Status: DC | PRN
  Administered 2016-09-05: 150 mg via INTRAVENOUS

## 2016-09-05 MED FILL — Heparin Sodium (Porcine) Inj 1000 Unit/ML: INTRAMUSCULAR | Qty: 30 | Status: AC

## 2016-09-05 MED FILL — Sodium Chloride IV Soln 0.9%: INTRAVENOUS | Qty: 2000 | Status: AC

## 2016-09-05 SURGICAL SUPPLY — 74 items
BAG DECANTER FOR FLEXI CONT (MISCELLANEOUS) ×2 IMPLANT
BENZOIN TINCTURE PRP APPL 2/3 (GAUZE/BANDAGES/DRESSINGS) ×4 IMPLANT
BLADE CLIPPER SURG (BLADE) IMPLANT
BUR CUTTER 7.0 ROUND (BURR) ×2 IMPLANT
BUR MATCHSTICK NEURO 3.0 LAGG (BURR) ×4 IMPLANT
CANISTER SUCT 3000ML PPV (MISCELLANEOUS) ×2 IMPLANT
CONT SPEC 4OZ CLIKSEAL STRL BL (MISCELLANEOUS) ×2 IMPLANT
COVER BACK TABLE 60X90IN (DRAPES) ×2 IMPLANT
DECANTER SPIKE VIAL GLASS SM (MISCELLANEOUS) ×2 IMPLANT
DERMABOND ADVANCED (GAUZE/BANDAGES/DRESSINGS) ×2
DERMABOND ADVANCED .7 DNX12 (GAUZE/BANDAGES/DRESSINGS) ×2 IMPLANT
DEVICE INTERBODY ELEVATE 9X23 (Cage) ×4 IMPLANT
DRAPE C-ARM 42X72 X-RAY (DRAPES) ×6 IMPLANT
DRAPE HALF SHEET 40X57 (DRAPES) IMPLANT
DRAPE LAPAROTOMY 100X72X124 (DRAPES) ×2 IMPLANT
DRAPE POUCH INSTRU U-SHP 10X18 (DRAPES) ×2 IMPLANT
DRAPE SURG 17X23 STRL (DRAPES) ×8 IMPLANT
DRSG OPSITE 4X5.5 SM (GAUZE/BANDAGES/DRESSINGS) ×2 IMPLANT
DRSG OPSITE POSTOP 3X4 (GAUZE/BANDAGES/DRESSINGS) ×2 IMPLANT
DRSG OPSITE POSTOP 4X10 (GAUZE/BANDAGES/DRESSINGS) ×2 IMPLANT
DURAPREP 26ML APPLICATOR (WOUND CARE) ×2 IMPLANT
ELECT REM PT RETURN 9FT ADLT (ELECTROSURGICAL) ×2
ELECTRODE REM PT RTRN 9FT ADLT (ELECTROSURGICAL) ×1 IMPLANT
EVACUATOR 1/8 PVC DRAIN (DRAIN) ×2 IMPLANT
GAUZE SPONGE 4X4 12PLY STRL (GAUZE/BANDAGES/DRESSINGS) IMPLANT
GAUZE SPONGE 4X4 16PLY XRAY LF (GAUZE/BANDAGES/DRESSINGS) ×4 IMPLANT
GLOVE BIOGEL PI IND STRL 7.0 (GLOVE) ×3 IMPLANT
GLOVE BIOGEL PI IND STRL 7.5 (GLOVE) ×4 IMPLANT
GLOVE BIOGEL PI INDICATOR 7.0 (GLOVE) ×3
GLOVE BIOGEL PI INDICATOR 7.5 (GLOVE) ×4
GLOVE ECLIPSE 7.5 STRL STRAW (GLOVE) ×8 IMPLANT
GLOVE ECLIPSE 9.0 STRL (GLOVE) ×4 IMPLANT
GLOVE EXAM NITRILE LRG STRL (GLOVE) IMPLANT
GLOVE EXAM NITRILE XL STR (GLOVE) IMPLANT
GLOVE EXAM NITRILE XS STR PU (GLOVE) IMPLANT
GLOVE SS BIOGEL STRL SZ 7 (GLOVE) ×1 IMPLANT
GLOVE SUPERSENSE BIOGEL SZ 7 (GLOVE) ×1
GLOVE SURG SS PI 7.0 STRL IVOR (GLOVE) ×10 IMPLANT
GOWN STRL REUS W/ TWL LRG LVL3 (GOWN DISPOSABLE) ×5 IMPLANT
GOWN STRL REUS W/ TWL XL LVL3 (GOWN DISPOSABLE) ×4 IMPLANT
GOWN STRL REUS W/TWL 2XL LVL3 (GOWN DISPOSABLE) IMPLANT
GOWN STRL REUS W/TWL LRG LVL3 (GOWN DISPOSABLE) ×5
GOWN STRL REUS W/TWL XL LVL3 (GOWN DISPOSABLE) ×4
GRAFT BN 10X1XDBM MAGNIFUSE (Bone Implant) ×2 IMPLANT
GRAFT BONE MAGNIFUSE 1X10CM (Bone Implant) ×2 IMPLANT
KIT BASIN OR (CUSTOM PROCEDURE TRAY) ×2 IMPLANT
KIT INFUSE SMALL (Orthopedic Implant) ×2 IMPLANT
KIT ROOM TURNOVER OR (KITS) ×2 IMPLANT
NEEDLE HYPO 22GX1.5 SAFETY (NEEDLE) ×2 IMPLANT
NS IRRIG 1000ML POUR BTL (IV SOLUTION) ×2 IMPLANT
PACK LAMINECTOMY NEURO (CUSTOM PROCEDURE TRAY) ×2 IMPLANT
PEN SKIN MARKING BROAD (MISCELLANEOUS) ×2 IMPLANT
ROD 5.5MM SPINAL SOLERA (Rod) ×2 IMPLANT
SCREW 5.5X40 (Screw) ×4 IMPLANT
SCREW CANN SOLERA 8.5X45 F/5.5 (Screw) ×4 IMPLANT
SCREW POST SOLERA 7.5X35 F/5.5 (Screw) ×4 IMPLANT
SCREW SET SOLERA (Screw) ×16 IMPLANT
SCREW SET SOLERA TI5.5 (Screw) ×16 IMPLANT
SCREW SOLERA 45X5.5XMA NS SPNE (Screw) ×4 IMPLANT
SCREW SOLERA 45X6.5XMA NS SPNE (Screw) ×2 IMPLANT
SCREW SOLERA 5.5X45MM (Screw) ×4 IMPLANT
SCREW SOLERA 6.5X45MM (Screw) ×2 IMPLANT
SCREW SOLERA 6.5X50 (Screw) ×8 IMPLANT
SPONGE SURGIFOAM ABS GEL 100 (HEMOSTASIS) ×4 IMPLANT
STRIP CLOSURE SKIN 1/2X4 (GAUZE/BANDAGES/DRESSINGS) ×6 IMPLANT
SUT VIC AB 0 CT1 18XCR BRD8 (SUTURE) ×3 IMPLANT
SUT VIC AB 0 CT1 8-18 (SUTURE) ×3
SUT VIC AB 2-0 CT1 18 (SUTURE) ×4 IMPLANT
SUT VIC AB 3-0 SH 8-18 (SUTURE) ×4 IMPLANT
SUT VICRYL 4-0 PS2 18IN ABS (SUTURE) ×4 IMPLANT
TOWEL OR 17X24 6PK STRL BLUE (TOWEL DISPOSABLE) ×2 IMPLANT
TOWEL OR 17X26 10 PK STRL BLUE (TOWEL DISPOSABLE) ×2 IMPLANT
TRAY FOLEY W/METER SILVER 16FR (SET/KITS/TRAYS/PACK) ×2 IMPLANT
WATER STERILE IRR 1000ML POUR (IV SOLUTION) ×2 IMPLANT

## 2016-09-05 NOTE — H&P (Signed)
David Mathews is an 75 y.o. male.   Chief Complaint: Back pain HPI: 75 year old male with severe back pain with neurogenic claudication failing conservative management. Patient is status post previous attempt at L2-L3 fusion by another physician. He is status post previous L3-4-5 fusion remotely in the past. The patient has obvious pseudarthrosis at L2-3 with kyphotic angulation at this level. He has a newly discovered moderately large right-sided L1-L2 disc herniation tracking behind the body of L1 on the right side causing severe compression of the thecal sac and cauda equina. Patient presents now for decompression and fusion in hopes of improving his symptoms.  Past Medical History:  Diagnosis Date  . Arthritis   . BPH (benign prostatic hyperplasia)   . Complication of anesthesia February 28, 2016   "PT STATES HARD TO PUT TO SLEEP WITH LAST SURGERY"  . Constipation   . Diabetes mellitus without complication (HCC)    Type II  . Head injury with loss of consciousness (HCC)    playing football  . Hypothyroidism   . Infected prosthetic knee joint, right unicompartmental knee  06/14/2015  . MSSA (methicillin susceptible Staphylococcus aureus) infection 07/19/2015  . Neuromuscular disorder (HCC)    neuropathy FINGERS TIPS LEFT HAND, AND FEET  . PONV (postoperative nausea and vomiting)    ether  . Postoperative wound infection 07/19/2015    Past Surgical History:  Procedure Laterality Date  . ARTHROSCOPIC KNEE SURGERY Right 1984  . BACK SURGERY  10/2010    hx of 2 fusions LOWER L 2, 3, 4 AND 5  . CARPAL TUNNEL RELEASE Left 2010,2015   x 2  . CARPAL TUNNEL RELEASE Right 2009  . COLONOSCOPY    . I&D EXTREMITY Right 06/15/2015   Procedure: IRRIGATION AND DEBRIDEMENT OF INFECTED PARTIAL RIGHT  KNEE AND POLY EXCHANGE, RIGHT KNEE SYNOVECTOMY;  Surgeon: Teryl LucyJoshua Landau, MD;  Location: [facility] OR;  Service: Orthopedics;  Laterality: Right;  . LUMBAR WOUND DEBRIDEMENT N/A 06/15/2015   Procedure:  LUMBAR WOUND IRRIGATION AND DEBRIDEMENT;  Surgeon: Aliene Beamsandy Kritzer, MD;  [facility];  Service: Neurosurgery;  Laterality: N/A;  . PARTIAL KNEE ARTHROPLASTY Bilateral 09/2009  . PARTIAL KNEE ARTHROPLASTY Right 02/28/2016   Procedure: RESECTION OF RIGHT UNI KNEE WITH PLACMENT WITH ANTIBIOTIC SPACERS;  Surgeon: Durene RomansMatthew Olin, MD;  Location: [facility] ORS;  Service: Orthopedics;  Laterality: Right;  . PICC LINE TO RIGHT UPPER ARM  March 02, 2016  . TOTAL KNEE REVISION Right 05/01/2016   Procedure: TOTAL KNEE REVISION REIMPLANTATION;  Surgeon: Durene RomansMatthew Olin, MD;  Location: [facility] ORS;  Service: Orthopedics;  Laterality: Right;  Adductor block in holding    History reviewed. No pertinent family history. Social History:  reports that he quit smoking about 17 years ago. His smoking use included Cigarettes. He has a 10.00 pack-year smoking history. He has never used smokeless tobacco. He reports that he drinks alcohol. He reports that he does not use drugs.  Allergies:  Allergies  Allergen Reactions  . Amoxicillin Rash and Other (See Comments)    INTOLERANCE >> YEAST INFECTION Has patient had a PCN reaction causing immediate rash, facial/tongue/throat swelling, SOB or lightheadedness with hypotension: No Has patient had a PCN reaction causing severe rash involving mucus membranes or skin necrosis: No Has patient had a PCN reaction that required hospitalization No Has patient had a PCN reaction occurring within the last 10 years: Yes If all of the above answers are "NO", then may proceed with Cephalosporin use.    Medications Prior to  Admission  Medication Sig Dispense Refill  . acetaminophen (TYLENOL) 500 MG tablet Take 2 tablets (1,000 mg total) by mouth every 8 (eight) hours. (Patient taking differently: Take 1,000 mg by mouth every 8 (eight) hours. ) 30 tablet 0  . Cholecalciferol (VITAMIN D PO) Take 1 tablet by mouth daily.    . ciprofloxacin (CIPRO) 750 MG tablet Take 1 tablet (750 mg total) by mouth 2  (two) times daily. 60 tablet 2  . Cyanocobalamin (VITAMIN B 12 PO) Take 1 tablet by mouth daily.    . diazepam (VALIUM) 5 MG tablet Take 1 tablet (5 mg total) by mouth every 6 (six) hours as needed for muscle spasms. 20 tablet 0  . docusate sodium (COLACE) 100 MG capsule Take 1 capsule (100 mg total) by mouth 2 (two) times daily. 10 capsule 0  . doxycycline (VIBRA-TABS) 100 MG tablet Take 1 tablet (100 mg total) by mouth every 12 (twelve) hours. 60 tablet 2  . ferrous sulfate 325 (65 FE) MG tablet Take 1 tablet (325 mg total) by mouth 3 (three) times daily after meals. (Patient taking differently: Take 325 mg by mouth 2 (two) times daily with a meal. )  3  . finasteride (PROSCAR) 5 MG tablet Take 5 mg by mouth every evening.     . fluticasone (FLONASE) 50 MCG/ACT nasal spray Place 1 spray into both nostrils daily as needed for allergies.    [name] insulin aspart (NOVOLOG FLEXPEN) 100 UNIT/ML FlexPen Inject 6-12 Units into the skin 3 (three) times daily with meals. Use a sliding scale.    . Insulin Glargine (LANTUS SOLOSTAR) 100 UNIT/ML Solostar Pen Inject 55 Units into the skin daily. Per sliding scale (Patient taking differently: Inject 16 Units into the skin daily. ) 15 mL 11  . levothyroxine (SYNTHROID, LEVOTHROID) 88 MCG tablet Take 88 mcg by mouth daily before breakfast.    . lisinopril-hydrochlorothiazide (PRINZIDE,ZESTORETIC) 20-25 MG per tablet Take 1 tablet by mouth every morning.     . Loratadine 10 MG CAPS Take 10 mg by mouth every evening.     [name] MAGNESIUM PO Take 1 tablet by mouth daily.    . metFORMIN (GLUCOPHAGE) 1000 MG tablet Take 1,000 mg by mouth 2 (two) times daily with a meal.    . Multiple Vitamins-Minerals (MULTIVITAMIN PO) Take 1 tablet by mouth daily.    [name] OVER THE COUNTER MEDICATION Take 565 mg by mouth 2 (two) times daily.     [name] oxyCODONE (OXY IR/ROXICODONE) 5 MG immediate release tablet Take 1-3 tablets (5-15 mg total) by mouth every 4 (four) hours as needed for severe pain.  (Patient taking differently: Take 5-10 mg by mouth every 4 (four) hours as needed for severe pain. ) 120 tablet 0  . polyethylene glycol (MIRALAX / GLYCOLAX) packet Take 17 g by mouth 2 (two) times daily. (Patient taking differently: Take 17 g by mouth 2 (two) times daily as needed for mild constipation. ) 14 each 0  . pregabalin (LYRICA) 225 MG capsule Take 225 mg by mouth 2 (two) times daily.    . silodosin (RAPAFLO) 8 MG CAPS capsule Take 8 mg by mouth daily with breakfast.    . simvastatin (ZOCOR) 20 MG tablet Take 20 mg by mouth every evening.     . celecoxib (CELEBREX) 200 MG capsule Take 1 capsule (200 mg total) by mouth every 12 (twelve) hours. (Patient not taking: Reported on 08/31/2016) 60 capsule 0  . fluconazole (DIFLUCAN) 200 MG tablet Take 1 tablet (  200 mg total) by mouth daily. (Patient not taking: Reported on 08/31/2016) 10 tablet 2    Results for orders placed or performed during the hospital encounter of 09/05/16 (from the past 48 hour(s))  Glucose, capillary     Status: Abnormal   Collection Time: 09/05/16  7:01 AM  Result Value Ref Range   Glucose-Capillary 169 (H) 65 - 99 mg/dL   No results found.  Pertinent items are noted in HPI.  Blood pressure 132/66, pulse 74, temperature 98.2 F (36.8 C), resp. rate 20, height 5\' 7"  (1.702 m), weight 81.2 kg (179 lb), SpO2 99 %.  Patient is awake and alert. He is oriented and appropriate. His speech is fluent. His judgment and insight are intact. Cranial nerve function normal bilaterally. Examination of his extremity function finds his motor strength to be intact aside from some moderate weakness of his right hip flexor and right quadriceps muscle. Sensory examination reveals decreased sensation pinprick and light touch in his right L2 and L3 dermatomes. No evidence of long track signs. Lower extremity deep tendon reflexes hypoactive. Gait is slow with a flexed posture. Examination head ears eyes and throat is unremarkable. Chest and  abdomen are benign. Extremities are free from injury or deformity. Assessment/Plan L1-L2 herniated pulposis with stenosis. Status post L2-3 decompression and fusion with pseudoarthrosis. Kyphosis. I discussed situation with patient. He is miserable and very limited by his current functional status. I discussed possibility of reexploring his lumbar spine with bilateral L1-L2 decompressive laminotomies and foraminotomies followed by posterior lumbar interbody fusion utilizing interbody expandable cages and local autograft. I would then performed posterior lateral fusion from T10 down to L3 or L4 depending on bone quality and fixation. This would require removal of his prior loose instrumentation at L2-3 and possibly removal of prior instrumentation at L4-5. I've discussed the risks involved with surgery including but not limited to the risk of anesthesia, bleeding, infection, CSF leak, nerve root injury, fusion failure, instrumentation failure, continued pain, and non-benefit. The patient has been given the opportunity to ask questions. He appears to understand. He wishes to proceed with surgery.  , A 09/05/2016, 7:24 AM

## 2016-09-05 NOTE — Anesthesia Postprocedure Evaluation (Signed)
Anesthesia Post Note  Patient: David Mathews  Procedure(s) Performed: Procedure(s) (LRB): POSTERIOR LUMBAR INTERBODY FUSION LUMBAR ONE-TWO,THORACIC TEN-LUMBAR FIVE POSTERIOR LATERAL FUSION (N/A)  Patient location during evaluation: PACU Anesthesia Type: General Level of consciousness: awake and alert Pain management: pain level controlled Vital Signs Assessment: post-procedure vital signs reviewed and stable Respiratory status: spontaneous breathing, nonlabored ventilation, respiratory function stable and patient connected to nasal cannula oxygen Cardiovascular status: blood pressure returned to baseline and stable Postop Assessment: no signs of nausea or vomiting Anesthetic complications: no    Last Vitals:  Vitals:   09/05/16 1420 09/05/16 1435  BP: 99/62 112/89  Pulse: 100 (!) 108  Resp: (!) 21 17  Temp:  36.7 C    Last Pain:  Vitals:   09/05/16 1435  PainSc: 3     LLE Motor Response: Responds to commands (09/05/16 1435) LLE Sensation: Full sensation (09/05/16 1435) RLE Motor Response: Responds to commands (09/05/16 1435) RLE Sensation: Full sensation (09/05/16 1435)      Bonita Quinichard S 

## 2016-09-05 NOTE — Op Note (Signed)
Date of procedure: 09/05/2016  Date of dictation: Same  Service: Neurosurgery  Preoperative diagnosis: L1-L2 herniated nucleus pulposus with severe stenosis and neurogenic claudication.  L2-3 pseudoarthrosis with failed instrumentation  L3-L5 prior posterior lateral fusion with L4-5 pedicle screw instrumentation  Kyphotic deformity  Postoperative diagnosis: Same  Procedure Name: Bilateral L1-L2 decompressive laminotomies and foraminotomies with microdiscectomy, more than would be required for simple interbody fusion alone.  Reexploration of failed fusion at L2-3 with removal of failed hardware  Reexploration of prior L3-L5 fusion with removal of hardware  L1-L2 posterior lumbar interbody fusion utilizing interbody expandable cages and local autograft  T10-L5 posterior lateral arthrodesis utilizing local autograft, magna diffuse allograft and bone morphogenic protein with segmental pedicle screw fixation.  Surgeon: A., M.D.  Asst. Surgeon: Ditty  Anesthesia: General  Indication: 75 year old male status post previous lumbar decompression and fusion from L3-L5 by another physician aseptically underwent L2-3 decompression infusion by yet another physician. This surgery complicated by likely infection. Patient presents now with obvious pseudoarthrosis. His infection has been adequately treated appears to be resolved. The patient has progressive kyphosis at L2-3 and L1-2. He has a large central disc herniation tracking behind the body of L1 with severe stenosis. Patient is failed conservative management. He presents to now for decompression and multilevel fusion.  Operative note: After induction of anesthesia, patient position prone onto Wilson frame and appropriately padded. Thoracic and lumbar region prepped and draped sterilely. Incision made from T10-L5. Dissection performed bilaterally. Retractors placed. Fluoroscopy used. Levels confirmed. Previously placed instrumentation  at L2-3 and at L4-5 were disassembled and removed. Fusion at L2-3 was incomplete. Hardware at L2 and L3 were loose area did hardware at L4 and L5 was solid but this wasn't incompatible system for extension. It was therefore removed. Pedicle screws were placed at L2 and L3 using a LARA system from [employer] area did 7.5 mm screws were placed bilaterally into L2 with good purchase. 8.5 mm screws were placed bilaterally at L3 with good purchase. 7.5 mm screws were placed bilaterally at L4 and L5 with good purchase all screws were placed through prior pedicle screw holes after probing these holes and founding them to be sufficiently within bone. Bilateral decompressive laminotomies and foraminotomies then performed using Leksell rongeurs [employer] years high-speed drill at L1-L2. The inferior aspect the L1 lamina was removed. The entire L1 inferior facet was removed bilaterally the majority the L2 superior facet was removed bilaterally. Ligament flavum and epidural scar were elevated and resected. There was some degree of chronic inflammatory tissue surrounding the thecal sac which was also resected. Again no evidence of infection. Bilateral discectomies were performed. At L1-2 on the right side a large amount of superior limb migrated disc fragment was dissected free and removed. Disc spaces and prepared for interbody fusion. With disc space distracted interspace was prepared for cage on the left side. A 9 mm lordotic [employer] expandable cage was packed with locally harvested autograft. This is impacted into place and expanded to its full extent. The contralateral side was prepared for interbody fusion. Morselize autograft was first packed into the interspace and then a second cage was packed into the interspace and expanded to its full extent. Pedicle screws are then placed under fluoroscopic guidance at T10 T11 T12 and L1 bilaterally again using [employer] Alaris system. Final images revealed good position of the  pedicle screws and cages at the proper operative level with improved alignment of the spine. Titanium rod was then cut and contoured and placed over  the screw heads from T10-L5. Locking caps and placed over the screw heads and locking caps were then engaged. Final lateral images revealed good reduction patient's kyphotic angulation and overall close to normal appearance of his sagittal balance. Wound is irrigated out like solution. Lamina of T10-11 and T12 were decorticated using high-speed drill. Morselized autograft and magna diffuse allograft was placed bilaterally. Bone morphogenic protein was placed in the gutters at L1-L2 and L3. Magna diffuse and local autograft was spaced bilaterally. A medium in that drain was left in epidural space. Vancomycin powder was placed the deep wound space. Wounds and close in layers with Vicryl sutures. Steri-Strips and sterile dressing were applied. No apparent complications. Patient tolerated the procedure well and he returns to the recovery room postop.

## 2016-09-05 NOTE — Anesthesia Procedure Notes (Signed)
Procedure Name: Intubation Date/Time: 09/05/2016 8:10 AM Performed by: Faustino CongressWHITE,  TENA  Pre-anesthesia Checklist: Patient identified, Emergency Drugs available, Suction available and Patient being monitored Patient Re-evaluated:Patient Re-evaluated prior to inductionOxygen Delivery Method: Circle System Utilized Preoxygenation: Pre-oxygenation with 100% oxygen Intubation Type: IV induction Ventilation: Mask ventilation without difficulty Laryngoscope Size: Glidescope and 4 Grade View: Grade I Tube type: Oral Tube size: 7.5 mm Number of attempts: 1 Airway Equipment and Method: Stylet and Video-laryngoscopy Placement Confirmation: ETT inserted through vocal cords under direct vision,  positive ETCO2 and breath sounds checked- equal and bilateral Secured at: 23 cm Tube secured with: Tape Dental Injury: Teeth and Oropharynx as per pre-operative assessment  Difficulty Due To: Difficulty was anticipated, Difficult Airway- due to reduced neck mobility, Difficult Airway- due to limited oral opening and Difficult Airway- due to anterior larynx Future Recommendations: Recommend- induction with short-acting agent, and alternative techniques readily available

## 2016-09-05 NOTE — Brief Op Note (Signed)
09/05/2016  1:33 PM  PATIENT:  Elodia Florenceussell F Muha  75 y.o. male  PRE-OPERATIVE DIAGNOSIS:  Stenosis  POST-OPERATIVE DIAGNOSIS:  Stenosis  PROCEDURE:  Procedure(s): POSTERIOR LUMBAR INTERBODY FUSION LUMBAR ONE-TWO,THORACIC TEN-LUMBAR FIVE POSTERIOR LATERAL FUSION (N/A)  SURGEON:  Surgeon(s) and Role:    * Julio SicksHenry , MD - Primary    * Loura HaltBenjamin Jared Ditty, MD - Assisting  PHYSICIAN ASSISTANT:   ASSISTANTS:    ANESTHESIA:   general  EBL:  Total I/O In: 3450 [I.V.:2650; Blood:300; IV Piggyback:500] Out: 1470 [Urine:720; Blood:750]  BLOOD ADMINISTERED:none  DRAINS: (med) Hemovact drain(s) in the epidural space with  Suction Open   LOCAL MEDICATIONS USED:  MARCAINE     SPECIMEN:  No Specimen  DISPOSITION OF SPECIMEN:  N/A  COUNTS:  YES  TOURNIQUET:  * No tourniquets in log *  DICTATION: .Dragon Dictation  PLAN OF CARE: Admit to inpatient   PATIENT DISPOSITION:  PACU - hemodynamically stable.   Delay start of Pharmacological VTE agent (>24hrs) due to surgical blood loss or risk of bleeding: yes

## 2016-09-05 NOTE — Progress Notes (Signed)
Report given to caroline rn as caregiver 

## 2016-09-05 NOTE — Progress Notes (Signed)
Patient asked his belongings be put in PACU.  Patient has a Environmental education officerwallet with his insurance card, [employer] and Companymilitary ID, license, and medicare card in wallet, no money in wallet.  Patient also has a cell phone and 2 tablets with his belongings.  Patient preferred that his belongings stay together in the PACU area.  I explained to the patient that his belongings would not be in a locked cabinet that it would be placed in a cabinet behind a unit that is locked.  Patient verbalized understanding and is ok with belongings being in PACU

## 2016-09-05 NOTE — Anesthesia Preprocedure Evaluation (Addendum)
Anesthesia Evaluation  Patient identified by MRN, date of birth, ID band Patient awake  General Assessment Comment:Past Medical History Diagnosis Date . Hypothyroidism  . Diabetes mellitus without complication (HCC)  . Arthritis  . Infected prosthetic knee joint, right unicompartmental knee  06/14/2015 . Postoperative wound infection 07/19/2015 . MSSA (methicillin susceptible Staphylococcus aureus) infection 07/19/2015 . Neuromuscular disorder (HCC)    neuropathy FINGERS TIPS LEFT HAND, AND FEET . BPH (benign prostatic hyperplasia)  . Complication of anesthesia February 28, 2016   "PT STATES HARD TO PUT TO SLEEP WITH LAST SURGERY"       Reviewed: Allergy & Precautions, NPO status , Patient's Chart, lab work & pertinent test results  History of Anesthesia Complications (+) PONV and history of anesthetic complications  Airway Mallampati: II  TM Distance: >3 FB Neck ROM: Full    Dental  (+) Edentulous Upper, Poor Dentition   Pulmonary former smoker,    Pulmonary exam normal breath sounds clear to auscultation       Cardiovascular hypertension, Pt. on medications Normal cardiovascular exam Rhythm:Regular Rate:Normal  ECHO 06-18-15: Study Conclusions  - Left ventricle: The cavity size was normal. Wall thickness was  normal. Systolic function was normal. The estimated ejection  fraction was in the range of 55% to 60%. Wall motion was normal;  there were no regional wall motion abnormalities. Doppler  parameters are consistent with abnormal left ventricular  relaxation (grade 1 diastolic dysfunction). - Aortic valve: Valve area (VTI): 1.82 cm^2. Valve area (Vmax):  1.79 cm^2. Valve area (Vmean): 1.64 cm^2. - Mitral valve: Calcified annulus. - Pericardium, extracardiac: A trivial pericardial effusion was  identified.  Impressions:  - Normal LV function; grade 1 diastolic  dysfunction; mildly  calcified aortic valve; mildly elevated velocity of 2.2 m/s  suggests mild AS but visually valve opens well.   Neuro/Psych negative psych ROS   GI/Hepatic negative GI ROS, Neg liver ROS,   Endo/Other  diabetes, Type 2, Oral Hypoglycemic Agents, Insulin DependentHypothyroidism   Renal/GU negative Renal ROS  negative genitourinary   Musculoskeletal  (+) Arthritis ,   Abdominal   Peds negative pediatric ROS (+)  Hematology negative hematology ROS (+)   Anesthesia Other Findings   Reproductive/Obstetrics negative OB ROS                            Anesthesia Physical  Anesthesia Plan  ASA: II  Anesthesia Plan: General   Post-op Pain Management:    Induction: Intravenous  Airway Management Planned: Oral ETT and Video Laryngoscope Planned  Additional Equipment:   Intra-op Plan:   Post-operative Plan: Extubation in OR  Informed Consent: I have reviewed the patients History and Physical, chart, labs and discussed the procedure including the risks, benefits and alternatives for the proposed anesthesia with the patient or authorized representative who has indicated his/her understanding and acceptance.   Dental advisory given  Plan Discussed with: CRNA  Anesthesia Plan Comments: ( )        Anesthesia Quick Evaluation

## 2016-09-05 NOTE — Progress Notes (Signed)
Dr Armanda Magicguidetti notifed of pts bs=[phone] orders obtained and carried out

## 2016-09-05 NOTE — Transfer of Care (Signed)
Immediate Anesthesia Transfer of Care Note  Patient: David Mathews  Procedure(s) Performed: Procedure(s): POSTERIOR LUMBAR INTERBODY FUSION LUMBAR ONE-TWO,THORACIC TEN-LUMBAR FIVE POSTERIOR LATERAL FUSION (N/A)  Patient Location: PACU  Anesthesia Type:General  Level of Consciousness: awake, alert , oriented and patient cooperative  Airway & Oxygen Therapy: Patient Spontanous Breathing and Patient connected to nasal cannula oxygen  Post-op Assessment: Report given to RN and Post -op Vital signs reviewed and stable  Post vital signs: Reviewed and stable  Last Vitals:  Vitals:   09/05/16 0632 09/05/16 0644  BP: 132/66   Pulse: 74   Resp: 20   Temp:  36.8 C    Last Pain: There were no vitals filed for this visit.    Patients Stated Pain Goal: 2 (09/05/16 [id number])  Complications: No apparent anesthesia complications

## 2016-09-05 NOTE — Progress Notes (Signed)
Orthopedic Tech Progress Note Patient Details:  David Mathews 02/26/1941 [id number] Patient already has brace Patient ID: David Mathews, male   DOB: 10/18/1941, 75 y.o.   MRN: [id number]   Jennye MoccasinHughes,  Craig 09/05/2016, 3:14 PM

## 2016-09-06 LAB — GLUCOSE, CAPILLARY: Glucose-Capillary: 267 mg/dL — ABNORMAL HIGH (ref 65–99)

## 2016-09-06 MED ORDER — OXYCODONE HCL 5 MG PO TABS: 5.0000 mg | ORAL_TABLET | ORAL | 0 refills | Status: DC | PRN

## 2016-09-06 MED ORDER — DIAZEPAM 5 MG PO TABS: 5.0000 mg | ORAL_TABLET | Freq: Four times a day (QID) | ORAL | 0 refills | Status: DC | PRN

## 2016-09-06 MED FILL — Thrombin For Soln 20000 Unit: CUTANEOUS | Qty: 1 | Status: AC

## 2016-09-06 NOTE — Evaluation (Signed)
Occupational Therapy Evaluation Patient Details Name: David Mathews MRN: [id number] DOB: 05-12-1941 Today's Date: 09/06/2016    History of Present Illness patient is a 75 yo male s/p underwent an uncomplicated T10-L5 fusion  Past Medical History:  Diagnosis Date  . Arthritis   . BPH (benign prostatic hyperplasia)   . Complication of anesthesia February 28, 2016   "PT STATES HARD TO PUT TO SLEEP WITH LAST SURGERY"  . Constipation   . Diabetes mellitus without complication (HCC)    Type II  . Head injury with loss of consciousness (HCC)    playing football  . Hypothyroidism   . Infected prosthetic knee joint, right unicompartmental knee  06/14/2015  . MSSA (methicillin susceptible Staphylococcus aureus) infection 07/19/2015  . Neuromuscular disorder (HCC)    neuropathy FINGERS TIPS LEFT HAND, AND FEET  . PONV (postoperative nausea and vomiting)    ether  . Postoperative wound infection 07/19/2015      Clinical Impression   Patient evaluated by Occupational Therapy with no further acute OT needs identified. All education has been completed and the patient has no further questions. See below for any follow-up Occupational Therapy or equipment needs. OT to sign off. Thank you for referral.      Follow Up Recommendations  No OT follow up    Equipment Recommendations  None recommended by OT    Recommendations for Other Services       Precautions / Restrictions Precautions Precautions: Back Precaution Booklet Issued: Yes (comment) Precaution Comments: verbally reviewed with patient  Required Braces or Orthoses: Spinal Brace Spinal Brace: Thoracolumbosacral orthotic Restrictions Weight Bearing Restrictions: No      Mobility Bed Mobility Overal bed mobility: Modified Independent             General bed mobility comments: in chair on arrival  Transfers Overall transfer level: Modified independent Equipment used: None             General transfer  comment: no physical assist, increased time to perform    Balance Overall balance assessment: Needs assistance   Sitting balance-Leahy Scale: Good       Standing balance-Leahy Scale: Good Standing balance comment: no physical assist or UE support needed                            ADL Overall ADL's : Independent                                       General ADL Comments: pt able to cross bil LE   Back handout provided and reviewed adls in detail. Pt educated on: clothing between brace, never sleep in brace, set an alarm at night for medication, avoid sitting for long periods of time, correct bed positioning for sleeping, correct sequence for bed mobility, avoiding lifting more than 5 pounds and never wash directly over incision. All education is complete and patient indicates understanding.     Vision     Perception     Praxis      Pertinent Vitals/Pain Pain Assessment: No/denies pain     Hand Dominance Left   Extremity/Trunk Assessment Upper Extremity Assessment Upper Extremity Assessment: Overall WFL for tasks assessed   Lower Extremity Assessment Lower Extremity Assessment: Overall WFL for tasks assessed   Cervical / Trunk Assessment Cervical / Trunk Assessment: Other exceptions (s/p surg)  Communication Communication Communication: No difficulties   Cognition Arousal/Alertness: Awake/alert Behavior During Therapy: WFL for tasks assessed/performed Overall Cognitive Status: Within Functional Limits for tasks assessed                     General Comments       Exercises       Shoulder Instructions      Home Living Family/patient expects to be discharged to:: Private residence Living Arrangements: Children Available Help at Discharge: Family;Available PRN/intermittently Type of Home: House Home Access: Stairs to enter Entrance Stairs-Number of Steps: ramp Entrance Stairs-Rails: Right;Left;Can reach both Home  Layout: One level     Bathroom Shower/Tub: Producer, television/film/videoWalk-in shower   Bathroom Toilet: Handicapped height Bathroom Accessibility: Yes   Home Equipment: Environmental consultantWalker - 2 wheels;Shower seat - built in;Crutches;Cane - single point;Toilet riser          Prior Functioning/Environment Level of Independence: Independent        Comments: still works        OT Problem List:     OT Treatment/Interventions:      OT Goals(Current goals can be found in the care plan section) Acute Rehab OT Goals Potential to Achieve Goals: Good  OT Frequency:     Barriers to D/C:            Co-evaluation              End of Session Equipment Utilized During Treatment: Back brace Nurse Communication: Mobility status;Precautions  Activity Tolerance: Patient tolerated treatment well Patient left: in chair;with call bell/phone within reach   Time: 0759-0819 OT Time Calculation (min): 20 min Charges:  OT General Charges $OT Visit: 1 Procedure OT Evaluation $OT Eval Low Complexity: 1 Procedure G-Codes:    David Mathews,  B 09/06/2016, 8:28 AM  David Mathews, David Mathews   OTR/L Pager: [phone]: [phone] Office: [phone] .

## 2016-09-06 NOTE — Discharge Summary (Signed)
Physician Discharge Summary  Patient ID: David Mathews MRN: [id number] DOB/AGE: 74/05/1941 74 y.o.  Admit date: 09/05/2016 Discharge date: 09/06/2016  Admission Diagnoses:  Discharge Diagnoses:  Active Problems:   Kyphosis   Discharged Condition: good  Hospital Course: Patient admitted the hospital where he underwent an uncomplicated T10-L5 fusion. Postoperative use doing very well. His preoperative pain is much improved. He is standing straight or he is walking better. He requests discharge home.  Consults:   Significant Diagnostic Studies:   Treatments:   Discharge Exam: Blood pressure 113/75, pulse 99, temperature 98.6 F (37 C), resp. rate 18, height 5\' 7"  (1.702 m), weight 81.2 kg (179 lb), SpO2 96 %. Awake and alert. Oriented and appropriate. Cranial nerve function intact. Motor and sensory function extremities normal. Wound clean and dry. Chest and abdomen benign.  Disposition: 01-Home or Self Care     Medication List    TAKE these medications   acetaminophen 500 MG tablet Commonly known as:  TYLENOL Take 2 tablets (1,000 mg total) by mouth every 8 (eight) hours.   ciprofloxacin 750 MG tablet Commonly known as:  CIPRO Take 1 tablet (750 mg total) by mouth 2 (two) times daily.   diazepam 5 MG tablet Commonly known as:  VALIUM Take 1-2 tablets (5-10 mg total) by mouth every 6 (six) hours as needed for muscle spasms. What changed:  how much to take   docusate sodium 100 MG capsule Commonly known as:  COLACE Take 1 capsule (100 mg total) by mouth 2 (two) times daily.   doxycycline 100 MG tablet Commonly known as:  VIBRA-TABS Take 1 tablet (100 mg total) by mouth every 12 (twelve) hours.   ferrous sulfate 325 (65 FE) MG tablet Take 1 tablet (325 mg total) by mouth 3 (three) times daily after meals. What changed:  when to take this   finasteride 5 MG tablet Commonly known as:  PROSCAR Take 5 mg by mouth every evening.   fluticasone 50 MCG/ACT  nasal spray Commonly known as:  FLONASE Place 1 spray into both nostrils daily as needed for allergies.   Insulin Glargine 100 UNIT/ML Solostar Pen Commonly known as:  LANTUS SOLOSTAR Inject 55 Units into the skin daily. Per sliding scale What changed:  how much to take  additional instructions   levothyroxine 88 MCG tablet Commonly known as:  SYNTHROID, LEVOTHROID Take 88 mcg by mouth daily before breakfast.   lisinopril-hydrochlorothiazide 20-25 MG tablet Commonly known as:  PRINZIDE,ZESTORETIC Take 1 tablet by mouth every morning.   Loratadine 10 MG Caps Take 10 mg by mouth every evening.   MAGNESIUM PO Take 1 tablet by mouth daily.   metFORMIN 1000 MG tablet Commonly known as:  GLUCOPHAGE Take 1,000 mg by mouth 2 (two) times daily with a meal.   MULTIVITAMIN PO Take 1 tablet by mouth daily.   NOVOLOG FLEXPEN 100 UNIT/ML FlexPen Generic drug:  insulin aspart Inject 6-12 Units into the skin 3 (three) times daily with meals. Use a sliding scale.   OVER THE COUNTER MEDICATION Take 565 mg by mouth 2 (two) times daily.   oxyCODONE 5 MG immediate release tablet Commonly known as:  Oxy IR/ROXICODONE Take 1-2 tablets (5-10 mg total) by mouth every 4 (four) hours as needed for severe pain.   polyethylene glycol packet Commonly known as:  MIRALAX / GLYCOLAX Take 17 g by mouth 2 (two) times daily. What changed:  when to take this  reasons to take this   pregabalin 225 MG capsule  Commonly known as:  LYRICA Take 225 mg by mouth 2 (two) times daily.   RAPAFLO 8 MG Caps capsule Generic drug:  silodosin Take 8 mg by mouth daily with breakfast.   simvastatin 20 MG tablet Commonly known as:  ZOCOR Take 20 mg by mouth every evening.   VITAMIN B 12 PO Take 1 tablet by mouth daily.   VITAMIN D PO Take 1 tablet by mouth daily.      Follow-up Information    Temple Pacini, MD .   Specialty:  Neurosurgery Contact information: [address] [place] [address] [phone]           Signed: Temple Pacini 09/06/2016, 8:09 AM

## 2016-09-06 NOTE — Discharge Instructions (Signed)

## 2016-09-06 NOTE — Progress Notes (Signed)
Patient alert and oriented, mae's well, voiding adequate amount of urine, swallowing without difficulty, c/o mild pain. Patient discharged home with family. Script and discharged instructions given to patient. Patient and family stated understanding of d/c instructions given and has an appointment with MD.   

## 2016-09-06 NOTE — Evaluation (Signed)
Physical Therapy Evaluation Patient Details Name: David Mathews MRN: [id number] DOB: 1941/04/29 Today's Date: 09/06/2016   History of Present Illness  patient is a 75 yo male s/p underwent an uncomplicated T10-L5 fusion  Clinical Impression  Patient seen for mobility assessment and education s/p spinal surgery. Patient mobilizing well. Educated on car transfers, mobility expectation, safety and precautions. No further acute PT needs, will sign off.    Follow Up Recommendations No PT follow up;Supervision - Intermittent    Equipment Recommendations  None recommended by PT    Recommendations for Other Services       Precautions / Restrictions Precautions Precautions: Back (JP drain in place) Precaution Booklet Issued: Yes (comment) Precaution Comments: verbally reviewed with patient  Required Braces or Orthoses: Spinal Brace Spinal Brace: Thoracolumbosacral orthotic Restrictions Weight Bearing Restrictions: No      Mobility  Bed Mobility Overal bed mobility: Modified Independent             General bed mobility comments: able to performed without difficulty  Transfers Overall transfer level: Modified independent Equipment used: None             General transfer comment: no physical assist, increased time to perform  Ambulation/Gait Ambulation/Gait assistance: Supervision Ambulation Distance (Feet): 340 Feet Assistive device: None Gait Pattern/deviations: Step-through pattern;Trunk flexed;Decreased stride length Gait velocity: decreased Gait velocity interpretation: Below normal speed for age/gender General Gait Details: VCs for upright posture and increased cadence  Stairs            Wheelchair Mobility    Modified Rankin (Stroke Patients Only)       Balance Overall balance assessment: Needs assistance   Sitting balance-Leahy Scale: Good       Standing balance-Leahy Scale: Good Standing balance comment: no physical assist or UE  support needed                             Pertinent Vitals/Pain Pain Assessment: No/denies pain (states "what pain")    Home Living Family/patient expects to be discharged to:: Private residence Living Arrangements: Children Available Help at Discharge: Family;Available PRN/intermittently Type of Home: House Home Access: Stairs to enter Entrance Stairs-Rails: Right;Left;Can reach both Entrance Stairs-Number of Steps: ramp Home Layout: One level Home Equipment: Walker - 2 wheels;Shower seat - built in;Crutches;Cane - single point;Toilet riser      Prior Function Level of Independence: Independent         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   Dominant Hand: Right    Extremity/Trunk Assessment   Upper Extremity Assessment: Overall WFL for tasks assessed           Lower Extremity Assessment: Overall WFL for tasks assessed         Communication   Communication: No difficulties  Cognition Arousal/Alertness: Awake/alert Behavior During Therapy: WFL for tasks assessed/performed Overall Cognitive Status: Within Functional Limits for tasks assessed                      General Comments      Exercises     Assessment/Plan    PT Assessment Patent does not need any further PT services  PT Problem List            PT Treatment Interventions      PT Goals (Current goals can be found in the Care Plan section)  Acute Rehab PT Goals PT Goal Formulation: All assessment and  education complete, DC therapy    Frequency     Barriers to discharge        Co-evaluation               End of Session Equipment Utilized During Treatment: Gait belt;Back brace Activity Tolerance: Patient tolerated treatment well Patient left: in chair;Other (comment) (with OT) Nurse Communication: Mobility status;Precautions    Functional Assessment Tool Used: clinical judgement Functional Limitation: Mobility: Walking and moving around Mobility:  Walking and Moving Around Current Status ([id number](G8978): At least 1 percent but less than 20 percent impaired, limited or restricted Mobility: Walking and Moving Around Goal Status [phone](G8979): At least 1 percent but less than 20 percent impaired, limited or restricted Mobility: Walking and Moving Around Discharge Status [phone](G8980): At least 1 percent but less than 20 percent impaired, limited or restricted    Time: 0740-0757 PT Time Calculation (min) (ACUTE ONLY): 17 min   Charges:   PT Evaluation $PT Eval Low Complexity: 1 Procedure     PT G Codes:   PT G-Codes **NOT FOR INPATIENT CLASS** Functional Assessment Tool Used: clinical judgement Functional Limitation: Mobility: Walking and moving around Mobility: Walking and Moving Around Current Status ([id number](G8978): At least 1 percent but less than 20 percent impaired, limited or restricted Mobility: Walking and Moving Around Goal Status [phone]): At least 1 percent but less than 20 percent impaired, limited or restricted Mobility: Walking and Moving Around Discharge Status [phone](G8980): At least 1 percent but less than 20 percent impaired, limited or restricted    Fabio Asa,  J 09/06/2016, 8:22 AM Charlotte Crumbevon , PT DPT  [phone]

## 2016-09-21 DIAGNOSIS — Z96651 Presence of right artificial knee joint: Secondary | ICD-10-CM | POA: Diagnosis not present

## 2016-09-21 DIAGNOSIS — Z471 Aftercare following joint replacement surgery: Secondary | ICD-10-CM | POA: Diagnosis not present

## 2016-10-10 DIAGNOSIS — Z6827 Body mass index (BMI) 27.0-27.9, adult: Secondary | ICD-10-CM | POA: Diagnosis not present

## 2016-10-10 DIAGNOSIS — I10 Essential (primary) hypertension: Secondary | ICD-10-CM | POA: Diagnosis not present

## 2016-10-10 DIAGNOSIS — M48061 Spinal stenosis, lumbar region without neurogenic claudication: Secondary | ICD-10-CM | POA: Diagnosis not present

## 2016-10-25 DIAGNOSIS — Z96651 Presence of right artificial knee joint: Secondary | ICD-10-CM | POA: Diagnosis not present

## 2016-10-25 DIAGNOSIS — Z471 Aftercare following joint replacement surgery: Secondary | ICD-10-CM | POA: Diagnosis not present

## 2016-11-29 DIAGNOSIS — M48061 Spinal stenosis, lumbar region without neurogenic claudication: Secondary | ICD-10-CM | POA: Diagnosis not present

## 2016-11-29 DIAGNOSIS — Z6828 Body mass index (BMI) 28.0-28.9, adult: Secondary | ICD-10-CM | POA: Diagnosis not present

## 2016-12-11 DIAGNOSIS — M545 Low back pain: Secondary | ICD-10-CM | POA: Diagnosis not present

## 2016-12-11 DIAGNOSIS — E1140 Type 2 diabetes mellitus with diabetic neuropathy, unspecified: Secondary | ICD-10-CM | POA: Diagnosis not present

## 2016-12-11 DIAGNOSIS — Z6829 Body mass index (BMI) 29.0-29.9, adult: Secondary | ICD-10-CM | POA: Diagnosis not present

## 2016-12-11 DIAGNOSIS — M79641 Pain in right hand: Secondary | ICD-10-CM | POA: Diagnosis not present

## 2016-12-11 DIAGNOSIS — R609 Edema, unspecified: Secondary | ICD-10-CM | POA: Diagnosis not present

## 2017-01-15 DIAGNOSIS — M79642 Pain in left hand: Secondary | ICD-10-CM | POA: Diagnosis not present

## 2017-01-15 DIAGNOSIS — Z794 Long term (current) use of insulin: Secondary | ICD-10-CM | POA: Diagnosis not present

## 2017-01-15 DIAGNOSIS — M19041 Primary osteoarthritis, right hand: Secondary | ICD-10-CM | POA: Diagnosis not present

## 2017-01-15 DIAGNOSIS — M79641 Pain in right hand: Secondary | ICD-10-CM | POA: Diagnosis not present

## 2017-01-15 DIAGNOSIS — E11618 Type 2 diabetes mellitus with other diabetic arthropathy: Secondary | ICD-10-CM | POA: Diagnosis not present

## 2017-01-15 DIAGNOSIS — M5136 Other intervertebral disc degeneration, lumbar region: Secondary | ICD-10-CM | POA: Diagnosis not present

## 2017-01-15 DIAGNOSIS — M19042 Primary osteoarthritis, left hand: Secondary | ICD-10-CM | POA: Diagnosis not present

## 2017-02-13 DIAGNOSIS — E039 Hypothyroidism, unspecified: Secondary | ICD-10-CM | POA: Diagnosis not present

## 2017-02-13 DIAGNOSIS — N401 Enlarged prostate with lower urinary tract symptoms: Secondary | ICD-10-CM | POA: Diagnosis not present

## 2017-02-13 DIAGNOSIS — R609 Edema, unspecified: Secondary | ICD-10-CM | POA: Diagnosis not present

## 2017-02-13 DIAGNOSIS — E1140 Type 2 diabetes mellitus with diabetic neuropathy, unspecified: Secondary | ICD-10-CM | POA: Diagnosis not present

## 2017-02-13 DIAGNOSIS — E7800 Pure hypercholesterolemia, unspecified: Secondary | ICD-10-CM | POA: Diagnosis not present

## 2017-03-22 DIAGNOSIS — Z6827 Body mass index (BMI) 27.0-27.9, adult: Secondary | ICD-10-CM | POA: Diagnosis not present

## 2017-03-22 DIAGNOSIS — M48062 Spinal stenosis, lumbar region with neurogenic claudication: Secondary | ICD-10-CM | POA: Diagnosis not present

## 2017-04-13 DIAGNOSIS — Z96651 Presence of right artificial knee joint: Secondary | ICD-10-CM | POA: Diagnosis not present

## 2017-04-13 DIAGNOSIS — Z471 Aftercare following joint replacement surgery: Secondary | ICD-10-CM | POA: Diagnosis not present

## 2017-05-08 DIAGNOSIS — M48061 Spinal stenosis, lumbar region without neurogenic claudication: Secondary | ICD-10-CM | POA: Diagnosis not present

## 2017-05-10 DIAGNOSIS — M48061 Spinal stenosis, lumbar region without neurogenic claudication: Secondary | ICD-10-CM | POA: Diagnosis not present

## 2017-05-15 DIAGNOSIS — M5136 Other intervertebral disc degeneration, lumbar region: Secondary | ICD-10-CM | POA: Diagnosis not present

## 2017-05-15 DIAGNOSIS — M546 Pain in thoracic spine: Secondary | ICD-10-CM | POA: Diagnosis not present

## 2017-05-15 DIAGNOSIS — M48061 Spinal stenosis, lumbar region without neurogenic claudication: Secondary | ICD-10-CM | POA: Diagnosis not present

## 2017-05-17 ENCOUNTER — Other Ambulatory Visit: Payer: Self-pay | Admitting: Neurosurgery

## 2017-05-17 DIAGNOSIS — M5126 Other intervertebral disc displacement, lumbar region: Secondary | ICD-10-CM | POA: Diagnosis not present

## 2017-05-18 MED ORDER — DEXAMETHASONE SODIUM PHOSPHATE 10 MG/ML IJ SOLN
10.0000 mg | INTRAMUSCULAR | Status: AC
  Administered 2017-05-21: 10 mg via INTRAVENOUS
  Filled 2017-05-18: qty 1

## 2017-05-18 MED ORDER — VANCOMYCIN HCL 10 G IV SOLR
1500.0000 mg | INTRAVENOUS | Status: AC
  Administered 2017-05-21: 1500 mg via INTRAVENOUS
  Filled 2017-05-18 (×2): qty 1500

## 2017-05-18 NOTE — Progress Notes (Signed)
Patient contacted and given preop instructions

## 2017-05-21 ENCOUNTER — Inpatient Hospital Stay (HOSPITAL_COMMUNITY): Payer: Medicare Other | Admitting: Anesthesiology

## 2017-05-21 ENCOUNTER — Inpatient Hospital Stay (HOSPITAL_COMMUNITY): Payer: Medicare Other

## 2017-05-21 ENCOUNTER — Encounter (HOSPITAL_COMMUNITY)
Admission: RE | Disposition: A | Discharge status: Home / Self Care | Payer: Self-pay | Source: Ambulatory Visit | Attending: Neurosurgery

## 2017-05-21 ENCOUNTER — Encounter (HOSPITAL_COMMUNITY): Payer: Self-pay

## 2017-05-21 ENCOUNTER — Inpatient Hospital Stay (HOSPITAL_COMMUNITY)
Admission: RE | Admit: 2017-05-21 | Discharge: 2017-05-22 | DRG: 455 | Disposition: A | Discharge status: Home / Self Care | Payer: Medicare Other | Source: Ambulatory Visit | Attending: Neurosurgery | Admitting: Neurosurgery

## 2017-05-21 DIAGNOSIS — M48061 Spinal stenosis, lumbar region without neurogenic claudication: Secondary | ICD-10-CM | POA: Diagnosis present

## 2017-05-21 DIAGNOSIS — N400 Enlarged prostate without lower urinary tract symptoms: Secondary | ICD-10-CM | POA: Diagnosis not present

## 2017-05-21 DIAGNOSIS — M5127 Other intervertebral disc displacement, lumbosacral region: Principal | ICD-10-CM | POA: Diagnosis present

## 2017-05-21 DIAGNOSIS — Z880 Allergy status to penicillin: Secondary | ICD-10-CM | POA: Diagnosis not present

## 2017-05-21 DIAGNOSIS — Z7984 Long term (current) use of oral hypoglycemic drugs: Secondary | ICD-10-CM

## 2017-05-21 DIAGNOSIS — E1140 Type 2 diabetes mellitus with diabetic neuropathy, unspecified: Secondary | ICD-10-CM | POA: Diagnosis present

## 2017-05-21 DIAGNOSIS — I10 Essential (primary) hypertension: Secondary | ICD-10-CM | POA: Diagnosis present

## 2017-05-21 DIAGNOSIS — Z79899 Other long term (current) drug therapy: Secondary | ICD-10-CM

## 2017-05-21 DIAGNOSIS — E039 Hypothyroidism, unspecified: Secondary | ICD-10-CM | POA: Diagnosis present

## 2017-05-21 DIAGNOSIS — M4807 Spinal stenosis, lumbosacral region: Secondary | ICD-10-CM | POA: Diagnosis not present

## 2017-05-21 DIAGNOSIS — Z87891 Personal history of nicotine dependence: Secondary | ICD-10-CM

## 2017-05-21 DIAGNOSIS — Z792 Long term (current) use of antibiotics: Secondary | ICD-10-CM

## 2017-05-21 DIAGNOSIS — Z7982 Long term (current) use of aspirin: Secondary | ICD-10-CM | POA: Diagnosis not present

## 2017-05-21 DIAGNOSIS — Z981 Arthrodesis status: Secondary | ICD-10-CM

## 2017-05-21 DIAGNOSIS — M5126 Other intervertebral disc displacement, lumbar region: Secondary | ICD-10-CM | POA: Diagnosis present

## 2017-05-21 DIAGNOSIS — Z794 Long term (current) use of insulin: Secondary | ICD-10-CM | POA: Diagnosis not present

## 2017-05-21 DIAGNOSIS — Z419 Encounter for procedure for purposes other than remedying health state, unspecified: Secondary | ICD-10-CM

## 2017-05-21 DIAGNOSIS — M4326 Fusion of spine, lumbar region: Secondary | ICD-10-CM | POA: Diagnosis not present

## 2017-05-21 HISTORY — DX: Essential (primary) hypertension: I10

## 2017-05-21 LAB — BASIC METABOLIC PANEL
Anion gap: 10 (ref 5–15)
BUN: 12 mg/dL (ref 6–20)
CALCIUM: 9.5 mg/dL (ref 8.9–10.3)
CO2: 25 mmol/L (ref 22–32)
CREATININE: 0.82 mg/dL (ref 0.61–1.24)
Chloride: 92 mmol/L — ABNORMAL LOW (ref 101–111)
GFR calc non Af Amer: >60 mL/min (ref >60)
Glucose, Bld: 160 mg/dL — ABNORMAL HIGH (ref 65–99)
Potassium: 4.7 mmol/L (ref 3.5–5.1)
SODIUM: 127 mmol/L — AB (ref 135–145)

## 2017-05-21 LAB — CBC WITH DIFFERENTIAL/PLATELET
BASOS PCT: 0 %
Basophils Absolute: 0 10*3/uL (ref 0.0–0.1)
Eosinophils Absolute: 0.4 10*3/uL (ref 0.0–0.7)
Eosinophils Relative: 6 %
HCT: 38.9 % — ABNORMAL LOW (ref 39.0–52.0)
HEMOGLOBIN: 13.2 g/dL (ref 13.0–17.0)
LYMPHS ABS: 1 10*3/uL (ref 0.7–4.0)
Lymphocytes Relative: 16 %
MCH: 29.6 pg (ref 26.0–34.0)
MCHC: 33.9 g/dL (ref 30.0–36.0)
MCV: 87.2 fL (ref 78.0–100.0)
MONOS PCT: 9 %
Monocytes Absolute: 0.6 10*3/uL (ref 0.1–1.0)
NEUTROS ABS: 4.2 10*3/uL (ref 1.7–7.7)
NEUTROS PCT: 69 %
Platelets: 244 10*3/uL (ref 150–400)
RBC: 4.46 MIL/uL (ref 4.22–5.81)
RDW: 12.7 % (ref 11.5–15.5)
WBC: 6.2 10*3/uL (ref 4.0–10.5)

## 2017-05-21 LAB — GLUCOSE, CAPILLARY
GLUCOSE-CAPILLARY: 152 mg/dL — AB (ref 65–99)
GLUCOSE-CAPILLARY: 138 mg/dL — AB (ref 65–99)
Glucose-Capillary: 296 mg/dL — ABNORMAL HIGH (ref 65–99)

## 2017-05-21 LAB — TYPE AND SCREEN
ABO/RH(D): B POS
ANTIBODY SCREEN: NEGATIVE

## 2017-05-21 LAB — SURGICAL PCR SCREEN
MRSA, PCR: NEGATIVE
STAPHYLOCOCCUS AUREUS: NEGATIVE

## 2017-05-21 SURGERY — POSTERIOR LUMBAR FUSION 1 LEVEL
Anesthesia: General | Site: Back

## 2017-05-21 MED ORDER — FLUTICASONE PROPIONATE 50 MCG/ACT NA SUSP
1.0000 | Freq: Every day | NASAL | Status: DC | PRN
  Filled 2017-05-21: qty 16

## 2017-05-21 MED ORDER — SUGAMMADEX SODIUM 200 MG/2ML IV SOLN
INTRAVENOUS | Status: AC
  Filled 2017-05-21: qty 2

## 2017-05-21 MED ORDER — DICLOFENAC SODIUM 75 MG PO TBEC
75.0000 mg | DELAYED_RELEASE_TABLET | Freq: Two times a day (BID) | ORAL | Status: DC
  Administered 2017-05-22: 75 mg via ORAL
  Filled 2017-05-21 (×2): qty 1

## 2017-05-21 MED ORDER — RISAQUAD PO CAPS
1.0000 | ORAL_CAPSULE | Freq: Every day | ORAL | Status: DC
  Administered 2017-05-22: 1 via ORAL
  Filled 2017-05-21 (×2): qty 1

## 2017-05-21 MED ORDER — DIAZEPAM 5 MG PO TABS
5.0000 mg | ORAL_TABLET | Freq: Four times a day (QID) | ORAL | Status: DC | PRN
  Administered 2017-05-22: 10 mg via ORAL
  Filled 2017-05-21: qty 2

## 2017-05-21 MED ORDER — ONDANSETRON HCL 4 MG/2ML IJ SOLN: 4.0000 mg | Freq: Four times a day (QID) | INTRAMUSCULAR | Status: DC | PRN

## 2017-05-21 MED ORDER — SODIUM CHLORIDE 0.9 % IJ SOLN
INTRAMUSCULAR | Status: AC
  Filled 2017-05-21: qty 10

## 2017-05-21 MED ORDER — SODIUM CHLORIDE 0.9% FLUSH: 3.0000 mL | Freq: Two times a day (BID) | INTRAVENOUS | Status: DC

## 2017-05-21 MED ORDER — VANCOMYCIN HCL 1000 MG IV SOLR
INTRAVENOUS | Status: DC | PRN
  Administered 2017-05-21: 1000 mg via TOPICAL

## 2017-05-21 MED ORDER — SODIUM CHLORIDE 0.9 % IV SOLN
250.0000 mL | INTRAVENOUS | Status: DC
  Administered 2017-05-21: 250 mL via INTRAVENOUS

## 2017-05-21 MED ORDER — METFORMIN HCL ER 500 MG PO TB24
1000.0000 mg | ORAL_TABLET | Freq: Two times a day (BID) | ORAL | Status: DC
  Administered 2017-05-21 – 2017-05-22 (×2): 1000 mg via ORAL
  Filled 2017-05-21 (×2): qty 2

## 2017-05-21 MED ORDER — MENTHOL 3 MG MT LOZG: 1.0000 | LOZENGE | OROMUCOSAL | Status: DC | PRN

## 2017-05-21 MED ORDER — PHENYLEPHRINE 40 MCG/ML (10ML) SYRINGE FOR IV PUSH (FOR BLOOD PRESSURE SUPPORT)
PREFILLED_SYRINGE | INTRAVENOUS | Status: DC | PRN
  Administered 2017-05-21 (×2): 120 ug via INTRAVENOUS
  Administered 2017-05-21: 160 ug via INTRAVENOUS

## 2017-05-21 MED ORDER — INSULIN ASPART 100 UNIT/ML FLEXPEN: 8.0000 [IU] | PEN_INJECTOR | Freq: Four times a day (QID) | SUBCUTANEOUS | Status: DC | PRN

## 2017-05-21 MED ORDER — PHENYLEPHRINE HCL 10 MG/ML IJ SOLN
INTRAMUSCULAR | Status: AC
  Filled 2017-05-21: qty 1

## 2017-05-21 MED ORDER — FINASTERIDE 5 MG PO TABS
5.0000 mg | ORAL_TABLET | Freq: Every evening | ORAL | Status: DC
  Administered 2017-05-21: 5 mg via ORAL
  Filled 2017-05-21: qty 1

## 2017-05-21 MED ORDER — VANCOMYCIN HCL IN DEXTROSE 750-5 MG/150ML-% IV SOLN
750.0000 mg | Freq: Once | INTRAVENOUS | Status: AC
  Administered 2017-05-22: 750 mg via INTRAVENOUS
  Filled 2017-05-21: qty 150

## 2017-05-21 MED ORDER — MUPIROCIN 2 % EX OINT
1.0000 "application " | TOPICAL_OINTMENT | Freq: Once | CUTANEOUS | Status: AC
  Administered 2017-05-21: 1 via TOPICAL

## 2017-05-21 MED ORDER — THROMBIN 20000 UNITS EX SOLR
CUTANEOUS | Status: AC
  Filled 2017-05-21: qty 20000

## 2017-05-21 MED ORDER — DOCUSATE SODIUM 100 MG PO CAPS
100.0000 mg | ORAL_CAPSULE | Freq: Every day | ORAL | Status: DC
  Administered 2017-05-22: 100 mg via ORAL
  Filled 2017-05-21: qty 1

## 2017-05-21 MED ORDER — LIDOCAINE 2% (20 MG/ML) 5 ML SYRINGE
INTRAMUSCULAR | Status: AC
  Filled 2017-05-21: qty 5

## 2017-05-21 MED ORDER — SUGAMMADEX SODIUM 200 MG/2ML IV SOLN
INTRAVENOUS | Status: DC | PRN
  Administered 2017-05-21: 200 mg via INTRAVENOUS

## 2017-05-21 MED ORDER — VECURONIUM BROMIDE 10 MG IV SOLR
INTRAVENOUS | Status: AC
  Filled 2017-05-21: qty 10

## 2017-05-21 MED ORDER — ALBUMIN HUMAN 5 % IV SOLN
INTRAVENOUS | Status: DC | PRN
  Administered 2017-05-21: 17:00:00 via INTRAVENOUS

## 2017-05-21 MED ORDER — 0.9 % SODIUM CHLORIDE (POUR BTL) OPTIME
TOPICAL | Status: DC | PRN
  Administered 2017-05-21: 1000 mL

## 2017-05-21 MED ORDER — VECURONIUM BROMIDE 10 MG IV SOLR
INTRAVENOUS | Status: DC | PRN
  Administered 2017-05-21 (×3): 1 mg via INTRAVENOUS
  Administered 2017-05-21: 7 mg via INTRAVENOUS
  Administered 2017-05-21: 1 mg via INTRAVENOUS

## 2017-05-21 MED ORDER — THROMBIN 20000 UNITS EX SOLR
CUTANEOUS | Status: DC | PRN
  Administered 2017-05-21: 16:00:00 via TOPICAL

## 2017-05-21 MED ORDER — CHLORHEXIDINE GLUCONATE CLOTH 2 % EX PADS: 6.0000 | MEDICATED_PAD | Freq: Once | CUTANEOUS | Status: DC

## 2017-05-21 MED ORDER — MIDAZOLAM HCL 5 MG/5ML IJ SOLN
INTRAMUSCULAR | Status: DC | PRN
  Administered 2017-05-21: 2 mg via INTRAVENOUS

## 2017-05-21 MED ORDER — LEVOTHYROXINE SODIUM 88 MCG PO TABS
88.0000 ug | ORAL_TABLET | Freq: Every day | ORAL | Status: DC
  Administered 2017-05-22: 88 ug via ORAL
  Filled 2017-05-21: qty 1

## 2017-05-21 MED ORDER — HYDROMORPHONE HCL 1 MG/ML IJ SOLN: 0.5000 mg | INTRAMUSCULAR | Status: DC | PRN

## 2017-05-21 MED ORDER — PROMETHAZINE HCL 25 MG/ML IJ SOLN: 6.2500 mg | INTRAMUSCULAR | Status: DC | PRN

## 2017-05-21 MED ORDER — HYDROMORPHONE HCL 1 MG/ML IJ SOLN: 0.2500 mg | INTRAMUSCULAR | Status: DC | PRN

## 2017-05-21 MED ORDER — LISINOPRIL-HYDROCHLOROTHIAZIDE 20-25 MG PO TABS: 1.0000 | ORAL_TABLET | Freq: Every morning | ORAL | Status: DC

## 2017-05-21 MED ORDER — HYDROCHLOROTHIAZIDE 25 MG PO TABS
25.0000 mg | ORAL_TABLET | Freq: Every day | ORAL | Status: DC
  Administered 2017-05-22: 25 mg via ORAL
  Filled 2017-05-21: qty 1

## 2017-05-21 MED ORDER — CIPROFLOXACIN HCL 750 MG PO TABS
750.0000 mg | ORAL_TABLET | Freq: Two times a day (BID) | ORAL | Status: DC
  Administered 2017-05-21 – 2017-05-22 (×2): 750 mg via ORAL
  Filled 2017-05-21 (×3): qty 1

## 2017-05-21 MED ORDER — INSULIN ASPART 100 UNIT/ML ~~LOC~~ SOLN
0.0000 [IU] | Freq: Three times a day (TID) | SUBCUTANEOUS | Status: DC
  Administered 2017-05-21: 11 [IU] via SUBCUTANEOUS
  Administered 2017-05-22: 4 [IU] via SUBCUTANEOUS

## 2017-05-21 MED ORDER — MAGNESIUM 250 MG PO TABS: 250.0000 mg | ORAL_TABLET | Freq: Every day | ORAL | Status: DC

## 2017-05-21 MED ORDER — SUFENTANIL CITRATE 50 MCG/ML IV SOLN
INTRAVENOUS | Status: AC
  Filled 2017-05-21: qty 1

## 2017-05-21 MED ORDER — PHENYLEPHRINE HCL 10 MG/ML IJ SOLN
INTRAVENOUS | Status: DC | PRN
  Administered 2017-05-21: 50 ug/min via INTRAVENOUS

## 2017-05-21 MED ORDER — POTASSIUM 99 MG PO TABS: 198.0000 mg | ORAL_TABLET | Freq: Two times a day (BID) | ORAL | Status: DC

## 2017-05-21 MED ORDER — MUPIROCIN 2 % EX OINT
TOPICAL_OINTMENT | CUTANEOUS | Status: AC
  Filled 2017-05-21: qty 22

## 2017-05-21 MED ORDER — SODIUM CHLORIDE 0.9 % IR SOLN
Status: DC | PRN
  Administered 2017-05-21: 500 mL

## 2017-05-21 MED ORDER — ONDANSETRON HCL 4 MG PO TABS: 4.0000 mg | ORAL_TABLET | Freq: Four times a day (QID) | ORAL | Status: DC | PRN

## 2017-05-21 MED ORDER — ONDANSETRON HCL 4 MG/2ML IJ SOLN
INTRAMUSCULAR | Status: DC | PRN
  Administered 2017-05-21: 4 mg via INTRAVENOUS

## 2017-05-21 MED ORDER — DOXYCYCLINE HYCLATE 100 MG PO TABS
100.0000 mg | ORAL_TABLET | Freq: Two times a day (BID) | ORAL | Status: DC
  Administered 2017-05-21 – 2017-05-22 (×2): 100 mg via ORAL
  Filled 2017-05-21 (×2): qty 1

## 2017-05-21 MED ORDER — POLYETHYLENE GLYCOL 3350 17 G PO PACK
17.0000 g | PACK | Freq: Two times a day (BID) | ORAL | Status: DC | PRN
Start: 2017-05-21 — End: 2017-05-22

## 2017-05-21 MED ORDER — ONDANSETRON HCL 4 MG/2ML IJ SOLN
INTRAMUSCULAR | Status: AC
  Filled 2017-05-21: qty 2

## 2017-05-21 MED ORDER — LACTATED RINGERS IV SOLN
INTRAVENOUS | Status: DC
  Administered 2017-05-21 (×2): via INTRAVENOUS

## 2017-05-21 MED ORDER — LORATADINE 10 MG PO TABS
10.0000 mg | ORAL_TABLET | Freq: Every evening | ORAL | Status: DC
  Administered 2017-05-21: 10 mg via ORAL
  Filled 2017-05-21: qty 1

## 2017-05-21 MED ORDER — BUPIVACAINE HCL (PF) 0.25 % IJ SOLN
INTRAMUSCULAR | Status: DC | PRN
  Administered 2017-05-21: 20 mL

## 2017-05-21 MED ORDER — LISINOPRIL 20 MG PO TABS
20.0000 mg | ORAL_TABLET | Freq: Every day | ORAL | Status: DC
  Administered 2017-05-22: 20 mg via ORAL
  Filled 2017-05-21: qty 1

## 2017-05-21 MED ORDER — HYDROCODONE-ACETAMINOPHEN 10-325 MG PO TABS
1.0000 | ORAL_TABLET | ORAL | Status: DC | PRN
  Administered 2017-05-21 – 2017-05-22 (×2): 2 via ORAL
  Filled 2017-05-21 (×2): qty 2

## 2017-05-21 MED ORDER — SUFENTANIL CITRATE 50 MCG/ML IV SOLN
INTRAVENOUS | Status: DC | PRN
  Administered 2017-05-21: 10 ug via INTRAVENOUS
  Administered 2017-05-21: 20 ug via INTRAVENOUS
  Administered 2017-05-21: 5 ug via INTRAVENOUS

## 2017-05-21 MED ORDER — SODIUM CHLORIDE 0.9% FLUSH: 3.0000 mL | INTRAVENOUS | Status: DC | PRN

## 2017-05-21 MED ORDER — PROPOFOL 10 MG/ML IV BOLUS
INTRAVENOUS | Status: DC | PRN
  Administered 2017-05-21: 140 mg via INTRAVENOUS

## 2017-05-21 MED ORDER — ALFUZOSIN HCL ER 10 MG PO TB24
10.0000 mg | ORAL_TABLET | Freq: Every day | ORAL | Status: DC
  Administered 2017-05-22: 10 mg via ORAL
  Filled 2017-05-21: qty 1

## 2017-05-21 MED ORDER — PROPOFOL 10 MG/ML IV BOLUS
INTRAVENOUS | Status: AC
  Filled 2017-05-21: qty 20

## 2017-05-21 MED ORDER — PHENOL 1.4 % MT LIQD: 1.0000 | OROMUCOSAL | Status: DC | PRN

## 2017-05-21 MED ORDER — CYCLOBENZAPRINE HCL 10 MG PO TABS
10.0000 mg | ORAL_TABLET | Freq: Two times a day (BID) | ORAL | Status: DC | PRN
  Administered 2017-05-21: 10 mg via ORAL
  Filled 2017-05-21: qty 1

## 2017-05-21 MED ORDER — INSULIN GLARGINE 100 UNIT/ML ~~LOC~~ SOLN
16.0000 [IU] | Freq: Every day | SUBCUTANEOUS | Status: DC
  Administered 2017-05-22: 16 [IU] via SUBCUTANEOUS
  Filled 2017-05-21: qty 0.16

## 2017-05-21 MED ORDER — SIMVASTATIN 20 MG PO TABS
20.0000 mg | ORAL_TABLET | Freq: Every evening | ORAL | Status: DC
  Administered 2017-05-21: 20 mg via ORAL
  Filled 2017-05-21: qty 1

## 2017-05-21 MED ORDER — MIDAZOLAM HCL 2 MG/2ML IJ SOLN
INTRAMUSCULAR | Status: AC
  Filled 2017-05-21: qty 2

## 2017-05-21 MED ORDER — VITAMIN D 1000 UNITS PO TABS
1000.0000 [IU] | ORAL_TABLET | Freq: Every day | ORAL | Status: DC
  Administered 2017-05-22: 1000 [IU] via ORAL
  Filled 2017-05-21: qty 1

## 2017-05-21 MED ORDER — CYANOCOBALAMIN 2500 MCG PO CHEW: 2500.0000 ug | CHEWABLE_TABLET | Freq: Every day | ORAL | Status: DC

## 2017-05-21 MED ORDER — LIDOCAINE 2% (20 MG/ML) 5 ML SYRINGE
INTRAMUSCULAR | Status: DC | PRN
  Administered 2017-05-21: 60 mg via INTRAVENOUS

## 2017-05-21 MED ORDER — PREGABALIN 75 MG PO CAPS
225.0000 mg | ORAL_CAPSULE | Freq: Two times a day (BID) | ORAL | Status: DC
  Administered 2017-05-21 – 2017-05-22 (×2): 225 mg via ORAL
  Filled 2017-05-21 (×2): qty 3

## 2017-05-21 MED ORDER — BUPIVACAINE HCL (PF) 0.25 % IJ SOLN
INTRAMUSCULAR | Status: AC
  Filled 2017-05-21: qty 30

## 2017-05-21 MED ORDER — VANCOMYCIN HCL 1000 MG IV SOLR
INTRAVENOUS | Status: AC
  Filled 2017-05-21: qty 1000

## 2017-05-21 SURGICAL SUPPLY — 64 items
BAG DECANTER FOR FLEXI CONT (MISCELLANEOUS) ×3 IMPLANT
BENZOIN TINCTURE PRP APPL 2/3 (GAUZE/BANDAGES/DRESSINGS) ×3 IMPLANT
BLADE CLIPPER SURG (BLADE) IMPLANT
BUR CUTTER 7.0 ROUND (BURR) IMPLANT
BUR MATCHSTICK NEURO 3.0 LAGG (BURR) ×3 IMPLANT
CANISTER SUCT 3000ML PPV (MISCELLANEOUS) ×3 IMPLANT
CARTRIDGE OIL MAESTRO DRILL (MISCELLANEOUS) ×1 IMPLANT
CLOSURE WOUND 1/2 X4 (GAUZE/BANDAGES/DRESSINGS) ×1
CONT SPEC 4OZ CLIKSEAL STRL BL (MISCELLANEOUS) ×3 IMPLANT
COVER BACK TABLE 60X90IN (DRAPES) ×3 IMPLANT
DECANTER SPIKE VIAL GLASS SM (MISCELLANEOUS) ×3 IMPLANT
DERMABOND ADVANCED (GAUZE/BANDAGES/DRESSINGS) ×2
DERMABOND ADVANCED .7 DNX12 (GAUZE/BANDAGES/DRESSINGS) ×1 IMPLANT
DEVICE INTERBODY ELEVATE 9X23 (Cage) ×6 IMPLANT
DIFFUSER DRILL AIR PNEUMATIC (MISCELLANEOUS) ×3 IMPLANT
DRAPE C-ARM 42X72 X-RAY (DRAPES) ×6 IMPLANT
DRAPE HALF SHEET 40X57 (DRAPES) IMPLANT
DRAPE LAPAROTOMY 100X72X124 (DRAPES) ×3 IMPLANT
DRAPE POUCH INSTRU U-SHP 10X18 (DRAPES) ×3 IMPLANT
DRAPE SURG 17X23 STRL (DRAPES) ×12 IMPLANT
DRSG OPSITE POSTOP 4X6 (GAUZE/BANDAGES/DRESSINGS) ×3 IMPLANT
DURAPREP 26ML APPLICATOR (WOUND CARE) ×3 IMPLANT
ELECT REM PT RETURN 9FT ADLT (ELECTROSURGICAL) ×3
ELECTRODE REM PT RTRN 9FT ADLT (ELECTROSURGICAL) ×1 IMPLANT
EVACUATOR 1/8 PVC DRAIN (DRAIN) ×3 IMPLANT
GAUZE SPONGE 4X4 12PLY STRL (GAUZE/BANDAGES/DRESSINGS) ×3 IMPLANT
GAUZE SPONGE 4X4 16PLY XRAY LF (GAUZE/BANDAGES/DRESSINGS) IMPLANT
GLOVE ECLIPSE 6.5 STRL STRAW (GLOVE) ×3 IMPLANT
GLOVE ECLIPSE 7.5 STRL STRAW (GLOVE) ×9 IMPLANT
GLOVE ECLIPSE 9.0 STRL (GLOVE) ×9 IMPLANT
GLOVE EXAM NITRILE LRG STRL (GLOVE) IMPLANT
GLOVE EXAM NITRILE XL STR (GLOVE) IMPLANT
GLOVE EXAM NITRILE XS STR PU (GLOVE) IMPLANT
GLOVE SURG SS PI 7.0 STRL IVOR (GLOVE) ×3 IMPLANT
GOWN STRL REUS W/ TWL LRG LVL3 (GOWN DISPOSABLE) ×2 IMPLANT
GOWN STRL REUS W/ TWL XL LVL3 (GOWN DISPOSABLE) ×3 IMPLANT
GOWN STRL REUS W/TWL 2XL LVL3 (GOWN DISPOSABLE) ×6 IMPLANT
GOWN STRL REUS W/TWL LRG LVL3 (GOWN DISPOSABLE) ×4
GOWN STRL REUS W/TWL XL LVL3 (GOWN DISPOSABLE) ×6
GRAFT BN 5X1XSPNE CVD POST DBM (Bone Implant) ×1 IMPLANT
GRAFT BONE MAGNIFUSE 1X5CM (Bone Implant) ×2 IMPLANT
KIT BASIN OR (CUSTOM PROCEDURE TRAY) ×3 IMPLANT
KIT INFUSE X SMALL 1.4CC (Orthopedic Implant) ×3 IMPLANT
KIT ROOM TURNOVER OR (KITS) ×3 IMPLANT
MILL MEDIUM DISP (BLADE) ×3 IMPLANT
NEEDLE HYPO 22GX1.5 SAFETY (NEEDLE) ×3 IMPLANT
NS IRRIG 1000ML POUR BTL (IV SOLUTION) ×3 IMPLANT
OIL CARTRIDGE MAESTRO DRILL (MISCELLANEOUS) ×3
PACK LAMINECTOMY NEURO (CUSTOM PROCEDURE TRAY) ×3 IMPLANT
RASP 3.0MM (RASP) ×3 IMPLANT
ROD 45MM SOLERA PREBENT (Rod) ×6 IMPLANT
SCREW 6.5X40 (Screw) ×6 IMPLANT
SCREW SET SOLERA (Screw) ×8 IMPLANT
SCREW SET SOLERA TI5.5 (Screw) ×4 IMPLANT
SPONGE SURGIFOAM ABS GEL 100 (HEMOSTASIS) ×3 IMPLANT
STRIP CLOSURE SKIN 1/2X4 (GAUZE/BANDAGES/DRESSINGS) ×2 IMPLANT
SUT VIC AB 0 CT1 18XCR BRD8 (SUTURE) ×2 IMPLANT
SUT VIC AB 0 CT1 8-18 (SUTURE) ×4
SUT VIC AB 2-0 CT1 18 (SUTURE) ×3 IMPLANT
SUT VIC AB 3-0 SH 8-18 (SUTURE) ×6 IMPLANT
TOWEL GREEN STERILE (TOWEL DISPOSABLE) ×3 IMPLANT
TOWEL GREEN STERILE FF (TOWEL DISPOSABLE) ×3 IMPLANT
TRAY FOLEY W/METER SILVER 14FR (SET/KITS/TRAYS/PACK) ×3 IMPLANT
WATER STERILE IRR 1000ML POUR (IV SOLUTION) ×3 IMPLANT

## 2017-05-21 NOTE — H&P (Signed)
David Mathews is an 76 y.o. male.   Chief Complaint: Back pain HPI: 76-year-old male status post previous T10-L5 decompression infusion with very good results presents now with severe back pain with radiation of both lower extremities. Workup demonstrates evidence of very large central L5-S1 disc herniation with caudal migration causing severe thecal sac compression. Patient presents now for decompression and fusion at L5-S1.  Past Medical History:  Diagnosis Date  . Arthritis   . BPH (benign prostatic hyperplasia)   . Complication of anesthesia February 28, 2016   "PT STATES HARD TO PUT TO SLEEP WITH LAST SURGERY"  . Constipation   . Diabetes mellitus without complication (HCC)    Type II  . Head injury with loss of consciousness (HCC)    playing football  . Hypertension   . Hypothyroidism   . Infected prosthetic knee joint, right unicompartmental knee  06/14/2015  . MSSA (methicillin susceptible Staphylococcus aureus) infection 07/19/2015  . Neuromuscular disorder (HCC)    neuropathy FINGERS TIPS LEFT HAND, AND FEET  . PONV (postoperative nausea and vomiting)    ether  . Postoperative wound infection 07/19/2015    Past Surgical History:  Procedure Laterality Date  . ARTHROSCOPIC KNEE SURGERY Right 1984  . BACK SURGERY  10/2010    hx of 2 fusions LOWER L 2, 3, 4 AND 5  . CARPAL TUNNEL RELEASE Left 2010,2015   x 2  . CARPAL TUNNEL RELEASE Right 2009  . COLONOSCOPY    . I&D EXTREMITY Right 06/15/2015   Procedure: IRRIGATION AND DEBRIDEMENT OF INFECTED PARTIAL RIGHT  KNEE AND POLY EXCHANGE, RIGHT KNEE SYNOVECTOMY;  Surgeon: Joshua Landau, MD;  Location: [facility] OR;  Service: Orthopedics;  Laterality: Right;  . LUMBAR WOUND DEBRIDEMENT N/A 06/15/2015   Procedure: LUMBAR WOUND IRRIGATION AND DEBRIDEMENT;  Surgeon: Randy Kritzer, MD;  Location: [facility] OR;  Service: Neurosurgery;  Laterality: N/A;  . PARTIAL KNEE ARTHROPLASTY Bilateral 09/2009  . PARTIAL KNEE ARTHROPLASTY Right 02/28/2016    Procedure: RESECTION OF RIGHT UNI KNEE WITH PLACMENT WITH ANTIBIOTIC SPACERS;  Surgeon: Matthew Olin, MD;  Location: [facility];  Service: Orthopedics;  Laterality: Right;  . PICC LINE TO RIGHT UPPER ARM  March 02, 2016  . TOTAL KNEE REVISION Right 05/01/2016   Procedure: TOTAL KNEE REVISION REIMPLANTATION;  Surgeon: Matthew Olin, MD;  Location: [facility] OR[facility];  Service: Orthopedics;  Laterality: Right;  Adductor block in holding    History reviewed. No pertinent family history. Social History:  reports that he quit smoking about 18 years ago. His smoking use included Cigarettes. He has a 10.00 pack-year smoking history. He has never used smokeless tobacco. He reports that he drinks alcohol. He reports that he does not use drugs.  Allergies:  Allergies  Allergen Reactions  . Amoxicillin Rash and Other (See Comments)    INTOLERANCE >> YEAST INFECTION  Has patient had a PCN reaction causing immediate rash, facial/tongue/throat swelling, SOB or lightheadedness with hypotension: No Has patient had a PCN reaction causing severe rash involving mucus membranes or skin necrosis: No Has patient had a PCN reaction that required hospitalization No Has patient had a PCN reaction occurring within the last 10 years: Yes If all of the above answers are "NO", then may proceed with Cephalosporin use.    Medications Prior to Admission  Medication Sig Dispense Refill  . alfuzosin (UROXATRAL) 10 MG 24 hr tablet Take 10 mg by mouth daily.    . aspirin EC 81 MG tablet Take 81 mg by mouth daily.    .   Cholecalciferol (VITAMIN D3) 1000 units CAPS Take 1,000 Units by mouth daily.    . ciprofloxacin (CIPRO) 750 MG tablet Take 1 tablet (750 mg total) by mouth 2 (two) times daily. 60 tablet 2  . Cyanocobalamin 2500 MCG CHEW Chew 2,500 mcg by mouth daily.    . cyclobenzaprine (FLEXERIL) 10 MG tablet Take 10 mg by mouth 2 (two) times daily as needed for muscle spasms.    . diclofenac (VOLTAREN) 75 MG EC tablet Take 75 mg by mouth  2 (two) times daily.    . diclofenac sodium (VOLTAREN) 1 % GEL Apply 1 application topically 4 (four) times daily as needed (pain).    [name] docusate sodium (COLACE) 100 MG capsule Take 1 capsule (100 mg total) by mouth 2 (two) times daily. (Patient taking differently: Take 100 mg by mouth daily. ) 10 capsule 0  . doxycycline (VIBRA-TABS) 100 MG tablet Take 1 tablet (100 mg total) by mouth every 12 (twelve) hours. 60 tablet 2  . finasteride (PROSCAR) 5 MG tablet Take 5 mg by mouth every evening.     . fluticasone (FLONASE) 50 MCG/ACT nasal spray Place 1 spray into both nostrils daily as needed for allergies.    [name] HYDROcodone-acetaminophen (NORCO) 10-325 MG tablet Take 1 tablet by mouth every 8 (eight) hours as needed for moderate pain.    [name] insulin aspart (NOVOLOG FLEXPEN) 100 UNIT/ML FlexPen Inject 8-10 Units into the skin 4 (four) times daily as needed for high blood sugar. Use a sliding scale.    . Insulin Glargine (LANTUS SOLOSTAR) 100 UNIT/ML Solostar Pen Inject 55 Units into the skin daily. Per sliding scale (Patient taking differently: Inject 16 Units into the skin daily. ) 15 mL 11  . levothyroxine (SYNTHROID, LEVOTHROID) 88 MCG tablet Take 88 mcg by mouth daily before breakfast.    . lisinopril-hydrochlorothiazide (PRINZIDE,ZESTORETIC) 20-25 MG per tablet Take 1 tablet by mouth every morning.     . Loratadine 10 MG CAPS Take 10 mg by mouth every evening.     . Magnesium 250 MG TABS Take 250 mg by mouth daily.    . metformin (FORTAMET) 1000 MG (OSM) 24 hr tablet Take 1,000 mg by mouth 2 (two) times daily with a meal.    . metFORMIN (GLUCOPHAGE) 1000 MG tablet Take 1,000 mg by mouth 2 (two) times daily with a meal.    . Multiple Vitamins-Minerals (MULTIVITAMIN PO) Take 1 tablet by mouth daily.    [name] oxyCODONE (OXY IR/ROXICODONE) 5 MG immediate release tablet Take 1-2 tablets (5-10 mg total) by mouth every 4 (four) hours as needed for severe pain. 90 tablet 0  . polyethylene glycol (MIRALAX /  GLYCOLAX) packet Take 17 g by mouth 2 (two) times daily. (Patient taking differently: Take 17 g by mouth 2 (two) times daily as needed for mild constipation. ) 14 each 0  . Potassium 99 MG TABS Take 198 mg by mouth 2 (two) times daily.    . pregabalin (LYRICA) 225 MG capsule Take 225 mg by mouth 2 (two) times daily.    . Probiotic CAPS Take 1 capsule by mouth daily.    . simvastatin (ZOCOR) 20 MG tablet Take 20 mg by mouth every evening.       Results for orders placed or performed during the hospital encounter of 05/21/17 (from the past 48 hour(s))  Glucose, capillary     Status: Abnormal   Collection Time: 05/21/17 12:52 PM  Result Value Ref Range   Glucose-Capillary 152 (H) 65 -  99 mg/dL  Type and screen     Status: None   Collection Time: 05/21/17  1:00 PM  Result Value Ref Range   ABO/RH(D) B POS    Antibody Screen NEG    Sample Expiration 05/24/2017   CBC WITH DIFFERENTIAL     Status: Abnormal   Collection Time: 05/21/17  1:04 PM  Result Value Ref Range   WBC 6.2 4.0 - 10.5 K/uL   RBC 4.46 4.22 - 5.81 MIL/uL   Hemoglobin 13.2 13.0 - 17.0 g/dL   HCT 38.9 (L) 39.0 - 52.0 %   MCV 87.2 78.0 - 100.0 fL   MCH 29.6 26.0 - 34.0 pg   MCHC 33.9 30.0 - 36.0 g/dL   RDW 12.7 11.5 - 15.5 %   Platelets 244 150 - 400 K/uL   Neutrophils Relative % 69 %   Neutro Abs 4.2 1.7 - 7.7 K/uL   Lymphocytes Relative 16 %   Lymphs Abs 1.0 0.7 - 4.0 K/uL   Monocytes Relative 9 %   Monocytes Absolute 0.6 0.1 - 1.0 K/uL   Eosinophils Relative 6 %   Eosinophils Absolute 0.4 0.0 - 0.7 K/uL   Basophils Relative 0 %   Basophils Absolute 0.0 0.0 - 0.1 K/uL  Basic metabolic panel     Status: Abnormal   Collection Time: 05/21/17  1:04 PM  Result Value Ref Range   Sodium 127 (L) 135 - 145 mmol/L   Potassium 4.7 3.5 - 5.1 mmol/L   Chloride 92 (L) 101 - 111 mmol/L   CO2 25 22 - 32 mmol/L   Glucose, Bld 160 (H) 65 - 99 mg/dL   BUN 12 6 - 20 mg/dL   Creatinine, Ser 0.82 0.61 - 1.24 mg/dL   Calcium 9.5  8.9 - 10.3 mg/dL   GFR calc non Af Amer >60 >60 mL/min   GFR calc Af Amer >60 >60 mL/min    Comment: (NOTE) The eGFR has been calculated using the CKD EPI equation. This calculation has not been validated in all clinical situations. eGFR's persistently <60 mL/min signify possible Chronic Kidney Disease.    Anion gap 10 5 - 15   No results found.  Pertinent items noted in HPI and remainder of comprehensive ROS otherwise negative.  Blood pressure (!) 147/67, pulse 73, temperature 98.5 F (36.9 C), temperature source Oral, resp. rate 20, height 5' 9" (1.753 m), weight 79.4 kg (175 lb), SpO2 100 %.  Patient is awake and alert. He is oriented and appropriate. He is absent quite uncomfortable. Examination head ears eyes and thirds unremarkable. Chest and abdomen are benign. Extremities are free from injury or deformity. Examination of his neurologic function. Some definite speech. Judgment and insight are intact. Cranial nerve function normal bilaterally. Motor examination is trace weakness of dorsiflexion bilaterally. He has moderate weakness of plantar flexion bilaterally. Sensory examination was decreased sensation  Light touch in bilateral S1 dermatomes. Reflexes normal active except his Achilles reflexes are absent bilaterally. Assessment/Plan L5-S1 central herniated pulposis with severe stenosis status post previous T10-L5 instrumented fusion. Plan L5-S1 bilateral laminotomies with bilateral microdiscectomies followed by posterior lumbar inner body fusion utilizing interbody expandable cages and locally harvested autograft. This will be coupled with revision of his lower lumbar instrumentation for pedicle screw fixation. Risks and benefits of been explained. Patient wishes to proceed.  , A 05/21/2017, 2:22 PM

## 2017-05-21 NOTE — Op Note (Signed)
Date of procedure: 05/21/2018  Date of dictation: Same  Service: Neurosurgery  Preoperative diagnosis: L5-S1 herniated nucleus pulposus with severe stenosis  Postoperative diagnosis: Same  Procedure Name: Bilateral L5-S1 redo laminotomies and bilateral microdiscectomies.  L5-S1 posterior lumbar interbody fusion utilizing interbody cages, locally harvested autograft, morcellized allograft, and bone morphogenic protein.  L5-S1 posterior lateral arthrodesis utilizing nonsegmental pedicle screw fixation and local autografting.  Reexploration and revision of previous T10-L5 fusion.  Surgeon: A., M.D.  Asst. Surgeon: Franky Machoabbell  Anesthesia: General  Indication: Patient is a 76 year old male status post T10-L5 decompression and fusion with instrumentation with good results. Patient was is now with severe back and bilateral lower extremity symptoms. Workup demonstrates evidence of very large central disc herniation with severe stenosis at L5-S1. Patient presents now for L5-S1 decompression and fusion.  Operative note: After induction anesthesia, patient position prone onto Wilson frame appropriate padded. Lumbar region prepped and draped sterilely. Incision made overlying L5 and S1. Dissection performed bilaterally. Previously placed pedicle screw instrumentation at L5 was dissected free. Rods were cut above the L5 screw heads. The rod section was then removed and screws are left in place. Retractor was placed. Fluoroscopy is used. Level confirmed. Complete laminectomy of L5 was then performed using Leksell rongeurs [employer] years high-speed drill. Ligament flavum and epidural scar elevated and resected. Bilateral discectomies performed at L5-S1 including all elements of the large inferior disc herniation. Preparations and made for interbody fusion. With the distractor left the patient's right side. Disc spaces then scraped of all soft tissue. A magnetic diffuse 5 mL packet was packed deep  within the disc space. A 9 mm extra lordotic Medtronic expandable cage packed with locally harvested autograft and packed into place and expanded to its full extent. Distractor removed patient's right side. Disc space once can prepared for body and fusion. Morselize autograft and a small bone morphogenic oh teen soaked sponge was then packed in the interspace. A second cage was then impacted into place and expanded to its full extent. Pedicles of S1 were notified bilaterally. Pilot holes were made overlying the S1 pedicle under fluoroscopic guidance. Each pedicles and probed using pedicle awl. Each all track was probed and found to be solidly within bone. Each all track was then tapped with a screw tap. Each screw tap hole was then probed and found to be solidly within bone. 6.5 x 40 mm Medtronic solera screws were placed bilaterally at S1. Final images revealed good position the cages and local screws mentation at proper upper level with normal lamina spine. Wounds and irrigated one final time. Short segment titanium rod placed or the screws at L5 and S1. Locking caps placed or the screws. Locking caps and engaged with the construct are mild compression. I residual facets and transverse processes decorticated. Morselize autograft packed posterior laterally for later fusion. Gelfoam was placed topically for hemostasis. Vancomycin powder left in the deep wound space. Wounds and close in layers with Vicryl sutures. Steri-Strips and sterile dressing were applied. There were no apparent complications. Patient tolerated the procedure well and he returns to the recovery room postop.

## 2017-05-21 NOTE — Transfer of Care (Signed)
Immediate Anesthesia Transfer of Care Note  Patient: David Mathews  Procedure(s) Performed: Procedure(s): Posterior Lumbar Interbody Fusion  - Lumbar five-Sacrum one (N/A)  Patient Location: PACU  Anesthesia Type:General  Level of Consciousness: awake, alert , oriented and patient cooperative  Airway & Oxygen Therapy: Patient Spontanous Breathing and Patient connected to face mask oxygen  Post-op Assessment: Report given to RN, Post -op Vital signs reviewed and stable and Patient moving all extremities X 4  Post vital signs: Reviewed and stable  Last Vitals:  Vitals:   05/21/17 1249  BP: (!) 147/67  Pulse: 73  Resp: 20  Temp: 36.9 C    Last Pain:  Vitals:   05/21/17 1249  TempSrc: Oral         Complications: No apparent anesthesia complications

## 2017-05-21 NOTE — Brief Op Note (Signed)
05/21/2017  5:31 PM  PATIENT:  David Mathews  76 y.o. male  PRE-OPERATIVE DIAGNOSIS:  HNP  POST-OPERATIVE DIAGNOSIS:  Herniated Nucleus Pulposis  PROCEDURE:  Procedure(s): Posterior Lumbar Interbody Fusion  - Lumbar five-Sacrum one (N/A)  SURGEON:  Surgeon(s) and Role:    * Julio SicksPool, , MD - Primary    * Coletta Memosabbell, Kyle, MD - Assisting  PHYSICIAN ASSISTANT:   ASSISTANTS:    ANESTHESIA:   general  EBL:  Total I/O In: 1750 [I.V.:1000; IV Piggyback:750] Out: 1150 [Urine:600; Blood:550]  BLOOD ADMINISTERED:none  DRAINS: none   LOCAL MEDICATIONS USED:  MARCAINE     SPECIMEN:  No Specimen  DISPOSITION OF SPECIMEN:  N/A  COUNTS:  YES  TOURNIQUET:  * No tourniquets in log *  DICTATION: .Dragon Dictation  PLAN OF CARE: Admit to inpatient   PATIENT DISPOSITION:  PACU - hemodynamically stable.   Delay start of Pharmacological VTE agent (>24hrs) due to surgical blood loss or risk of bleeding: yes

## 2017-05-21 NOTE — Anesthesia Preprocedure Evaluation (Signed)
Anesthesia Evaluation  Patient identified by MRN, date of birth, ID band Patient awake    Reviewed: Allergy & Precautions, NPO status , Patient's Chart, lab work & pertinent test results  History of Anesthesia Complications (+) PONV  Airway Mallampati: II  TM Distance: >3 FB Neck ROM: Full    Dental no notable dental hx.    Pulmonary neg pulmonary ROS, former smoker,    Pulmonary exam normal breath sounds clear to auscultation       Cardiovascular hypertension, Normal cardiovascular exam Rhythm:Regular Rate:Normal     Neuro/Psych negative neurological ROS  negative psych ROS   GI/Hepatic negative GI ROS, Neg liver ROS,   Endo/Other  diabetesHypothyroidism   Renal/GU negative Renal ROS  negative genitourinary   Musculoskeletal negative musculoskeletal ROS (+)   Abdominal   Peds negative pediatric ROS (+)  Hematology negative hematology ROS (+)   Anesthesia Other Findings   Reproductive/Obstetrics negative OB ROS                             Anesthesia Physical Anesthesia Plan  ASA: III  Anesthesia Plan: General   Post-op Pain Management:    Induction: Intravenous  PONV Risk Score and Plan: 2 and Ondansetron, Dexamethasone, Treatment may vary due to age or medical condition and Propofol  Airway Management Planned: Oral ETT  Additional Equipment:   Intra-op Plan:   Post-operative Plan: Extubation in OR  Informed Consent: I have reviewed the patients History and Physical, chart, labs and discussed the procedure including the risks, benefits and alternatives for the proposed anesthesia with the patient or authorized representative who has indicated his/her understanding and acceptance.   Dental advisory given  Plan Discussed with: CRNA and Surgeon  Anesthesia Plan Comments:         Anesthesia Quick Evaluation

## 2017-05-21 NOTE — Progress Notes (Signed)
ANTIBIOTIC CONSULT NOTE - INITIAL  Pharmacy Consult for Vancomycin x 1 dose post-op Indication: surgical prophylaxis  Allergies  Allergen Reactions  . Amoxicillin Rash and Other (See Comments)    INTOLERANCE >> YEAST INFECTION  Has patient had a PCN reaction causing immediate rash, facial/tongue/throat swelling, SOB or lightheadedness with hypotension: No Has patient had a PCN reaction causing severe rash involving mucus membranes or skin necrosis: No Has patient had a PCN reaction that required hospitalization No Has patient had a PCN reaction occurring within the last 10 years: Yes If all of the above answers are "NO", then may proceed with Cephalosporin use.    Patient Measurements: Height: 5\' 9"  (175.3 cm) Weight: 175 lb (79.4 kg) IBW/kg (Calculated) : 70.7  Labs:  Recent Labs  05/21/17 1304  WBC 6.2  HGB 13.2  PLT 244  CREATININE 0.82   Estimated Creatinine Clearance: 77.8 mL/min (by C-G formula based on SCr of 0.82 mg/dL).  Microbiology: Recent Results (from the past 720 hour(s))  Surgical pcr screen     Status: None   Collection Time: 05/21/17  2:23 PM  Result Value Ref Range Status   MRSA, PCR NEGATIVE NEGATIVE Final   Staphylococcus aureus NEGATIVE NEGATIVE Final    Comment:        The Xpert SA Assay (FDA approved for NASAL specimens in patients over 75 years of age), is one component of a comprehensive surveillance program.  Test performance has been validated by [facility] for patients greater than or equal to 75 year old. It is not intended to diagnose infection nor to guide or monitor treatment.     Medical History: Past Medical History:  Diagnosis Date  . Arthritis   . BPH (benign prostatic hyperplasia)   . Complication of anesthesia February 28, 2016   "PT STATES HARD TO PUT TO SLEEP WITH LAST SURGERY"  . Constipation   . Diabetes mellitus without complication (HCC)    Type II  . Head injury with loss of consciousness (HCC)    playing  football  . Hypertension   . Hypothyroidism   . Infected prosthetic knee joint, right unicompartmental knee  06/14/2015  . MSSA (methicillin susceptible Staphylococcus aureus) infection 07/19/2015  . Neuromuscular disorder (HCC)    neuropathy FINGERS TIPS LEFT HAND, AND FEET  . PONV (postoperative nausea and vomiting)    ether  . Postoperative wound infection 07/19/2015   Assessment:  76 yr old male s/p lumbar surgery. No drain.  For Vancomycin x 1 post-op.    Received Vancomycin 1500 mg IV pre-op at 2:20pm.  Patient reports on chronic Cipro and Doxycyline for hx MRSA in right knee in 2016.  Goal of Therapy:  Vancomycin trough level 10-15 mcg/ml  Plan:   Vancomycin 750 mg IV x 1 at ~3am on 7/3.  Pharmacy to sign off.  No follow up needed.  Dennie FettersEgan,  Donovan, RPh Pager: [phone] 05/21/2017,7:37 PM

## 2017-05-21 NOTE — Anesthesia Procedure Notes (Signed)
Procedure Name: Intubation Date/Time: 05/21/2017 2:40 PM Performed by: Melina Copa,  R Pre-anesthesia Checklist: Patient identified, Emergency Drugs available, Suction available and Patient being monitored Patient Re-evaluated:Patient Re-evaluated prior to inductionOxygen Delivery Method: Circle System Utilized Preoxygenation: Pre-oxygenation with 100% oxygen Intubation Type: IV induction Ventilation: Mask ventilation without difficulty Laryngoscope Size: Mac and 4 Grade View: Grade II Tube type: Oral Tube size: 7.5 mm Number of attempts: 1 Airway Equipment and Method: Stylet Placement Confirmation: ETT inserted through vocal cords under direct vision,  positive ETCO2 and breath sounds checked- equal and bilateral Secured at: 23 cm Tube secured with: Tape Dental Injury: Teeth and Oropharynx as per pre-operative assessment

## 2017-05-22 LAB — HEMOGLOBIN A1C
HEMOGLOBIN A1C: 8.2 % — AB (ref 4.8–5.6)
MEAN PLASMA GLUCOSE: 189 mg/dL

## 2017-05-22 LAB — GLUCOSE, CAPILLARY
GLUCOSE-CAPILLARY: 195 mg/dL — AB (ref 65–99)
GLUCOSE-CAPILLARY: 281 mg/dL — AB (ref 65–99)

## 2017-05-22 MED ORDER — DIAZEPAM 5 MG PO TABS: 5.0000 mg | ORAL_TABLET | Freq: Four times a day (QID) | ORAL | 0 refills | Status: DC | PRN

## 2017-05-22 MED ORDER — HYDROCODONE-ACETAMINOPHEN 10-325 MG PO TABS: 1.0000 | ORAL_TABLET | ORAL | 0 refills | Status: DC | PRN

## 2017-05-22 NOTE — Anesthesia Postprocedure Evaluation (Signed)
Anesthesia Post Note  Patient: David Mathews  Procedure(s) Performed: Procedure(s) (LRB): Posterior Lumbar Interbody Fusion  - Lumbar five-Sacrum one (N/A)     Patient location during evaluation: PACU Anesthesia Type: General Level of consciousness: awake and alert Pain management: pain level controlled Vital Signs Assessment: post-procedure vital signs reviewed and stable Respiratory status: spontaneous breathing, nonlabored ventilation, respiratory function stable and patient connected to nasal cannula oxygen Cardiovascular status: blood pressure returned to baseline and stable Postop Assessment: no signs of nausea or vomiting Anesthetic complications: no    Last Vitals:  Vitals:   05/22/17 0809 05/22/17 1159  BP: 122/72 (!) 111/92  Pulse: 90 95  Resp: 18 18  Temp: 37 C (!) 36.4 C    Last Pain:  Vitals:   05/22/17 1159  TempSrc: Oral  PainSc:                  , S

## 2017-05-22 NOTE — Evaluation (Signed)
Occupational Therapy Evaluation and Discharge Patient Details Name: David Mathews F Selke MRN: [id number] DOB: 09/15/1941 Today's Date: 05/22/2017    History of Present Illness Admitted for Posterior Lumbar Interbody Fusion  - Lumbar five-Sacrum one;  has a past medical history of Arthritis; Diabetes mellitus without complication (HCC); Head injury with loss of consciousness (HCC); Hypertension; Hypothyroidism; Infected prosthetic knee joint, right unicompartmental knee  (06/14/2015); MSSA (methicillin susceptible Staphylococcus aureus) infection (07/19/2015); Neuromuscular disorder    Clinical Impression   Pt reports he was independent with ADL PTA. Currently pt min guard assist for ADL and functional mobility. All back, safety, and ADL education completed with pt. Pt planning to d/c home with intermittent supervision from family. No further acute OT needs identified; signing off at this time. Please re-consult if needs change. Thank you for this referral.    Follow Up Recommendations  No OT follow up;Supervision/Assistance - 24 hour (initially)    Equipment Recommendations  None recommended by OT    Recommendations for Other Services PT consult     Precautions / Restrictions Precautions Precautions: Back;Fall Precaution Booklet Issued: Yes (comment) Precaution Comments: Educated pt on back precautions Required Braces or Orthoses: Spinal Brace Spinal Brace: Lumbar corset Restrictions Weight Bearing Restrictions: No      Mobility Bed Mobility               General bed mobility comments: Pt OOB in chair upon arrival.  Transfers Overall transfer level: Needs assistance Equipment used: None Transfers: Sit to/from Stand Sit to Stand: Min guard         General transfer comment: min guard for balance and safety. RW needed for mobility.    Balance Overall balance assessment: Needs assistance Sitting-balance support: Feet supported;No upper extremity supported Sitting  balance-Leahy Scale: Good     Standing balance support: Bilateral upper extremity supported Standing balance-Leahy Scale: Poor Standing balance comment: RW for support                           ADL either performed or assessed with clinical judgement   ADL Overall ADL's : Needs assistance/impaired Eating/Feeding: Set up;Sitting   Grooming: Min guard;Standing   Upper Body Bathing: Set up;Supervision/ safety;Sitting   Lower Body Bathing: Min guard;Sit to/from stand   Upper Body Dressing : Set up;Supervision/safety;Sitting Upper Body Dressing Details (indicate cue type and reason): pt reports no questions/concerns regarding donning/doffing back brace Lower Body Dressing: Min guard;Sit to/from stand Lower Body Dressing Details (indicate cue type and reason): Pt able to cross foot over opposite knee. Educated on compensatory strategies for LB ADL. Toilet Transfer: Min guard;Ambulation;RW Toilet Transfer Details (indicate cue type and reason): Simulated by sit to stand from chair with functional mobility in room.     Tub/ Shower Transfer: Min guard;Walk-in shower;Ambulation;Shower seat;Rolling walker   Functional mobility during ADLs: Min guard;Rolling walker General ADL Comments: Educated pt on maintaining back precautions during functional activities.     Vision         Perception     Praxis      Pertinent Vitals/Pain Pain Assessment: Faces Faces Pain Scale: Hurts little more Pain Location: back Pain Descriptors / Indicators: Burning;Sore Pain Intervention(s): Monitored during session     Hand Dominance Left   Extremity/Trunk Assessment Upper Extremity Assessment Upper Extremity Assessment: Overall WFL for tasks assessed   Lower Extremity Assessment Lower Extremity Assessment: Defer to PT evaluation   Cervical / Trunk Assessment Cervical / Trunk Assessment: Other exceptions;Kyphotic  Cervical / Trunk Exceptions: s/p spinal sx   Communication  Communication Communication: No difficulties   Cognition Arousal/Alertness: Awake/alert Behavior During Therapy: WFL for tasks assessed/performed Overall Cognitive Status: Within Functional Limits for tasks assessed                                     General Comments       Exercises     Shoulder Instructions      Home Living Family/patient expects to be discharged to:: Private residence Living Arrangements: Children Available Help at Discharge: Family;Available PRN/intermittently Type of Home: House Home Access: Ramped entrance     Home Layout: One level     Bathroom Shower/Tub: Producer, television/film/video: Handicapped height     Home Equipment: Environmental consultant - 2 wheels;Shower seat - built in;Crutches;Cane - single point;Toilet riser          Prior Functioning/Environment Level of Independence: Independent with assistive device(s)        Comments: has been using cane for mobility recently, travels frequently and still works        OT Problem List:        OT Treatment/Interventions:      OT Goals(Current goals can be found in the care plan section) Acute Rehab OT Goals Patient Stated Goal: go home OT Goal Formulation: All assessment and education complete, DC therapy  OT Frequency:     Barriers to D/C:            Co-evaluation              AM-PAC PT "6 Clicks" Daily Activity     Outcome Measure Help from another person eating meals?: None Help from another person taking care of personal grooming?: A Little Help from another person toileting, which includes using toliet, bedpan, or urinal?: A Little Help from another person bathing (including washing, rinsing, drying)?: A Little Help from another person to put on and taking off regular upper body clothing?: A Little Help from another person to put on and taking off regular lower body clothing?: A Little 6 Click Score: 19   End of Session Equipment Utilized During Treatment:  Rolling walker;Back brace Nurse Communication: Mobility status;Other (comment) (no f/u or equipment needs)  Activity Tolerance: Patient tolerated treatment well Patient left: in chair;with call bell/phone within reach  OT Visit Diagnosis: Unsteadiness on feet (R26.81);Other abnormalities of gait and mobility (R26.89)                Time: 1610-9604 OT Time Calculation (min): 23 min Charges:  OT General Charges $OT Visit: 1 Procedure OT Evaluation $OT Eval Moderate Complexity: 1 Procedure OT Treatments $Self Care/Home Management : 8-22 mins G-Codes:     Fredric Mare A. Brett Albino, M.S., OTR/L Pager: [phone]  Gaye Alken 05/22/2017, 9:22 AM

## 2017-05-22 NOTE — Progress Notes (Signed)
Discharge instructions printed and reviewed with patient, and copy given for them to take home. All questions addressed at this time. New prescriptions reviewed. Room searched for patient belongings, and confirmed with patient that all valuables were accounted for. Family assisted patient to dress, then staff escorted patient to discharge via wheelchair.

## 2017-05-22 NOTE — Discharge Instructions (Signed)

## 2017-05-22 NOTE — Discharge Summary (Signed)
Physician Discharge Summary  Patient ID: David Mathews MRN: [id number] DOB/AGE: [birth year] 76 y.o.  Admit date: 05/21/2017 Discharge date: 05/22/2017  Admission Diagnoses:  Discharge Diagnoses:  Active Problems:   HNP (herniated nucleus pulposus), lumbar   Discharged Condition: good  Hospital Course: Patient mid-to the hospital where he underwent uncomplicated lumbar decompression and fusion. Postoperatively doing very well. Preoperative back and lower extremity pain much improved. And Billy without difficulty. Ready for discharge home.  Consults:   Significant Diagnostic Studies:   Treatments:   Discharge Exam: Blood pressure 122/72, pulse 90, temperature 98.6 F (37 C), temperature source Oral, resp. rate 18, height 5\' 9"  (1.753 m), weight 79.4 kg (175 lb), SpO2 93 %. Awake and alert. Oriented and appropriate. Motor and sensory function intact. Wound clean and dry. Chest and abdomen benign  Disposition: 01-Home or Self Care   Allergies as of 05/22/2017      Reactions   Amoxicillin Rash, Other (See Comments)   INTOLERANCE >> YEAST INFECTION Has patient had a PCN reaction causing immediate rash, facial/tongue/throat swelling, SOB or lightheadedness with hypotension: No Has patient had a PCN reaction causing severe rash involving mucus membranes or skin necrosis: No Has patient had a PCN reaction that required hospitalization No Has patient had a PCN reaction occurring within the last 10 years: Yes If all of the above answers are "NO", then may proceed with Cephalosporin use.      Medication List    TAKE these medications   aspirin EC 81 MG tablet Take 81 mg by mouth daily.   ciprofloxacin 750 MG tablet Commonly known as:  CIPRO Take 1 tablet (750 mg total) by mouth 2 (two) times daily.   Cyanocobalamin 2500 MCG Chew Chew 2,500 mcg by mouth daily.   cyclobenzaprine 10 MG tablet Commonly known as:  FLEXERIL Take 10 mg by mouth 2 (two) times daily as needed for  muscle spasms.   diazepam 5 MG tablet Commonly known as:  VALIUM Take 1-2 tablets (5-10 mg total) by mouth every 6 (six) hours as needed for muscle spasms.   diclofenac 75 MG EC tablet Commonly known as:  VOLTAREN Take 75 mg by mouth 2 (two) times daily.   diclofenac sodium 1 % Gel Commonly known as:  VOLTAREN Apply 1 application topically 4 (four) times daily as needed (pain).   docusate sodium 100 MG capsule Commonly known as:  COLACE Take 1 capsule (100 mg total) by mouth 2 (two) times daily. What changed:  when to take this   doxycycline 100 MG tablet Commonly known as:  VIBRA-TABS Take 1 tablet (100 mg total) by mouth every 12 (twelve) hours.   finasteride 5 MG tablet Commonly known as:  PROSCAR Take 5 mg by mouth every evening.   fluticasone 50 MCG/ACT nasal spray Commonly known as:  FLONASE Place 1 spray into both nostrils daily as needed for allergies.   HYDROcodone-acetaminophen 10-325 MG tablet Commonly known as:  NORCO Take 1-2 tablets by mouth every 4 (four) hours as needed (breakthrough pain). What changed:  how much to take  when to take this  reasons to take this   Insulin Glargine 100 UNIT/ML Solostar Pen Commonly known as:  LANTUS SOLOSTAR Inject 55 Units into the skin daily. Per sliding scale What changed:  how much to take  additional instructions   levothyroxine 88 MCG tablet Commonly known as:  SYNTHROID, LEVOTHROID Take 88 mcg by mouth daily before breakfast.   lisinopril-hydrochlorothiazide 20-25 MG tablet Commonly known as:  PRINZIDE,ZESTORETIC Take 1 tablet by mouth every morning.   Loratadine 10 MG Caps Take 10 mg by mouth every evening.   Magnesium 250 MG Tabs Take 250 mg by mouth daily.   metformin 1000 MG (OSM) 24 hr tablet Commonly known as:  FORTAMET Take 1,000 mg by mouth 2 (two) times daily with a meal.   metFORMIN 1000 MG tablet Commonly known as:  GLUCOPHAGE Take 1,000 mg by mouth 2 (two) times daily with a  meal.   MULTIVITAMIN PO Take 1 tablet by mouth daily.   NOVOLOG FLEXPEN 100 UNIT/ML FlexPen Generic drug:  insulin aspart Inject 8-10 Units into the skin 4 (four) times daily as needed for high blood sugar. Use a sliding scale.   oxyCODONE 5 MG immediate release tablet Commonly known as:  Oxy IR/ROXICODONE Take 1-2 tablets (5-10 mg total) by mouth every 4 (four) hours as needed for severe pain.   polyethylene glycol packet Commonly known as:  MIRALAX / GLYCOLAX Take 17 g by mouth 2 (two) times daily. What changed:  when to take this  reasons to take this   Potassium 99 MG Tabs Take 198 mg by mouth 2 (two) times daily.   pregabalin 225 MG capsule Commonly known as:  LYRICA Take 225 mg by mouth 2 (two) times daily.   Probiotic Caps Take 1 capsule by mouth daily.   simvastatin 20 MG tablet Commonly known as:  ZOCOR Take 20 mg by mouth every evening.   UROXATRAL 10 MG 24 hr tablet Generic drug:  alfuzosin Take 10 mg by mouth daily.   Vitamin D3 1000 units Caps Take 1,000 Units by mouth daily.            Durable Medical Equipment        Start     Ordered   05/21/17 1853  DME Walker rolling  Once    Question:  Patient needs a walker to treat with the following condition  Answer:  HNP (herniated nucleus pulposus), lumbar   05/21/17 1852   05/21/17 1853  DME 3 n 1  Once     05/21/17 1852       Signed: , A 05/22/2017, 10:27 AM

## 2017-05-22 NOTE — Evaluation (Signed)
Physical Therapy Evaluation Patient Details Name: David Mathews MRN: [id number] DOB: 10/12/1941 Today's Date: 05/22/2017   History of Present Illness  Admitted for Posterior Lumbar Interbody Fusion  - Lumbar five-Sacrum one;  has a past medical history of Arthritis; Diabetes mellitus without complication (HCC); Head injury with loss of consciousness (HCC); Hypertension; Hypothyroidism; Infected prosthetic knee joint, right unicompartmental knee  (06/14/2015); MSSA (methicillin susceptible Staphylococcus aureus) infection (07/19/2015); Neuromuscular disorder   Clinical Impression  Patient is s/p above surgery resulting in functional limitations due to the deficits listed below (see PT Problem List). Well-versed in back surgery and hopeful to get home today; we discussed PT follow up, will arrange through Neurosurgery follow-up;  Patient will benefit from skilled PT to increase their independence and safety with mobility to allow discharge to the venue listed below.       Follow Up Recommendations Outpatient PT (The potential need for Outpatient PT can be addressed at Neurosurgery follow-up appointments. )    Equipment Recommendations  None recommended by PT    Recommendations for Other Services       Precautions / Restrictions Precautions Precautions: Back;Fall Precaution Booklet Issued: Yes (comment) Precaution Comments: Educated pt on back precautions Required Braces or Orthoses: Spinal Brace Spinal Brace: Lumbar corset Restrictions Weight Bearing Restrictions: No      Mobility  Bed Mobility               General bed mobility comments: Did not perfomr bed mobiilty with me this session, however he verbalized correct method and has had previous back surgeries and is knowledgeable  Transfers Overall transfer level: Needs assistance Equipment used: None Transfers: Sit to/from Stand Sit to Stand: Min guard         General transfer comment: min guard for balance and  safety. RW needed for mobility.  Ambulation/Gait Ambulation/Gait assistance: Min guard Ambulation Distance (Feet): 150 Feet Assistive device: Rolling walker (2 wheeled) Gait Pattern/deviations: Trunk flexed;Decreased step length - right;Decreased step length - left     General Gait Details: Good use of RW for support; with increased distance, tends to sink inot trunk flexed and also a bit of knees flexed in stance posture; abel to correct when pointed out to pt; he tells me this is habit, but that he wants to work on it  Information systems managertairs            Wheelchair Mobility    Modified Rankin (Stroke Patients Only)       Balance Overall balance assessment: Needs assistance Sitting-balance support: Feet supported;No upper extremity supported Sitting balance-Leahy Scale: Good     Standing balance support: Bilateral upper extremity supported Standing balance-Leahy Scale: Poor Standing balance comment: RW for support                             Pertinent Vitals/Pain Pain Assessment: 0-10 Pain Score: 2  Faces Pain Scale: No hurt Pain Location: back Pain Descriptors / Indicators: Burning;Sore Pain Intervention(s): Monitored during session    Home Living Family/patient expects to be discharged to:: Private residence Living Arrangements: Children Available Help at Discharge: Family;Available PRN/intermittently Type of Home: House Home Access: Ramped entrance     Home Layout: One level Home Equipment: Walker - 2 wheels;Shower seat - built in;Crutches;Cane - single point;Toilet riser      Prior Function Level of Independence: Independent with assistive device(s)         Comments: has been using cane for mobility recently,  travels frequently and still works     Higher education careers adviser   Dominant Hand: Left    Extremity/Trunk Assessment   Upper Extremity Assessment Upper Extremity Assessment: Overall WFL for tasks assessed    Lower Extremity Assessment Lower  Extremity Assessment: Generalized weakness    Cervical / Trunk Assessment Cervical / Trunk Assessment: Other exceptions;Kyphotic Cervical / Trunk Exceptions: s/p spinal sx  Communication   Communication: No difficulties  Cognition Arousal/Alertness: Awake/alert Behavior During Therapy: WFL for tasks assessed/performed Overall Cognitive Status: Within Functional Limits for tasks assessed                                        General Comments      Exercises     Assessment/Plan    PT Assessment Patient needs continued PT services  PT Problem List Decreased strength;Decreased activity tolerance;Decreased balance;Decreased mobility;Other (comment) (suboptimal posture)       PT Treatment Interventions DME instruction;Gait training;Functional mobility training;Therapeutic activities;Therapeutic exercise;Balance training;Patient/family education    PT Goals (Current goals can be found in the Care Plan section)  Acute Rehab PT Goals Patient Stated Goal: go home PT Goal Formulation: With patient Time For Goal Achievement: 05/29/17 Potential to Achieve Goals: Good    Frequency Min 5X/week   Barriers to discharge        Co-evaluation               AM-PAC PT "6 Clicks" Daily Activity  Outcome Measure Difficulty turning over in bed (including adjusting bedclothes, sheets and blankets)?: None Difficulty moving from lying on back to sitting on the side of the bed? : None Difficulty sitting down on and standing up from a chair with arms (e.g., wheelchair, bedside commode, etc,.)?: A Little Help needed moving to and from a bed to chair (including a wheelchair)?: None Help needed walking in hospital room?: None Help needed climbing 3-5 steps with a railing? : A Little 6 Click Score: 22    End of Session Equipment Utilized During Treatment: Back brace Activity Tolerance: Patient tolerated treatment well Patient left: in chair;with call bell/phone within  reach;Other (comment) (MD in room) Nurse Communication: Mobility status PT Visit Diagnosis: Unsteadiness on feet (R26.81);Other abnormalities of gait and mobility (R26.89)    Time: 0930-1000 PT Time Calculation (min) (ACUTE ONLY): 30 min   Charges:   PT Evaluation $PT Eval Moderate Complexity: 1 Procedure PT Treatments $Gait Training: 8-22 mins   PT G Codes:        Van Clines, PT  Acute Rehabilitation Services Pager [phone] Office [phone]   Levi Aland 05/22/2017, 12:46 PM

## 2017-05-26 DIAGNOSIS — D72829 Elevated white blood cell count, unspecified: Secondary | ICD-10-CM | POA: Diagnosis not present

## 2017-05-26 DIAGNOSIS — Z87891 Personal history of nicotine dependence: Secondary | ICD-10-CM | POA: Diagnosis not present

## 2017-05-26 DIAGNOSIS — I493 Ventricular premature depolarization: Secondary | ICD-10-CM | POA: Diagnosis not present

## 2017-05-26 DIAGNOSIS — R066 Hiccough: Secondary | ICD-10-CM | POA: Diagnosis not present

## 2017-05-26 DIAGNOSIS — Z880 Allergy status to penicillin: Secondary | ICD-10-CM | POA: Diagnosis not present

## 2017-05-26 DIAGNOSIS — I10 Essential (primary) hypertension: Secondary | ICD-10-CM | POA: Diagnosis not present

## 2017-05-26 DIAGNOSIS — R000 Tachycardia, unspecified: Secondary | ICD-10-CM | POA: Diagnosis not present

## 2017-05-26 DIAGNOSIS — E1110 Type 2 diabetes mellitus with ketoacidosis without coma: Secondary | ICD-10-CM | POA: Diagnosis not present

## 2017-05-26 DIAGNOSIS — R748 Abnormal levels of other serum enzymes: Secondary | ICD-10-CM | POA: Diagnosis not present

## 2017-05-26 DIAGNOSIS — E039 Hypothyroidism, unspecified: Secondary | ICD-10-CM | POA: Diagnosis not present

## 2017-05-26 DIAGNOSIS — A419 Sepsis, unspecified organism: Secondary | ICD-10-CM | POA: Diagnosis not present

## 2017-05-26 DIAGNOSIS — R9431 Abnormal electrocardiogram [ECG] [EKG]: Secondary | ICD-10-CM | POA: Diagnosis not present

## 2017-05-26 DIAGNOSIS — R4182 Altered mental status, unspecified: Secondary | ICD-10-CM | POA: Diagnosis not present

## 2017-05-27 ENCOUNTER — Observation Stay (HOSPITAL_COMMUNITY)
Admission: AD | Admit: 2017-05-27 | Discharge: 2017-05-27 | Disposition: A | Discharge status: Home / Self Care | Payer: Medicare Other | Source: Other Acute Inpatient Hospital | Attending: Internal Medicine | Admitting: Internal Medicine

## 2017-05-27 ENCOUNTER — Observation Stay (HOSPITAL_COMMUNITY): Payer: Medicare Other

## 2017-05-27 ENCOUNTER — Encounter (HOSPITAL_COMMUNITY): Payer: Self-pay | Admitting: Family Medicine

## 2017-05-27 DIAGNOSIS — E1110 Type 2 diabetes mellitus with ketoacidosis without coma: Secondary | ICD-10-CM | POA: Diagnosis not present

## 2017-05-27 DIAGNOSIS — Z8614 Personal history of Methicillin resistant Staphylococcus aureus infection: Secondary | ICD-10-CM | POA: Diagnosis not present

## 2017-05-27 DIAGNOSIS — R410 Disorientation, unspecified: Secondary | ICD-10-CM

## 2017-05-27 DIAGNOSIS — Z791 Long term (current) use of non-steroidal anti-inflammatories (NSAID): Secondary | ICD-10-CM | POA: Diagnosis not present

## 2017-05-27 DIAGNOSIS — Z96653 Presence of artificial knee joint, bilateral: Secondary | ICD-10-CM | POA: Insufficient documentation

## 2017-05-27 DIAGNOSIS — G629 Polyneuropathy, unspecified: Secondary | ICD-10-CM | POA: Diagnosis not present

## 2017-05-27 DIAGNOSIS — E871 Hypo-osmolality and hyponatremia: Secondary | ICD-10-CM | POA: Diagnosis not present

## 2017-05-27 DIAGNOSIS — E039 Hypothyroidism, unspecified: Secondary | ICD-10-CM | POA: Insufficient documentation

## 2017-05-27 DIAGNOSIS — E876 Hypokalemia: Secondary | ICD-10-CM | POA: Diagnosis not present

## 2017-05-27 DIAGNOSIS — Z9889 Other specified postprocedural states: Secondary | ICD-10-CM

## 2017-05-27 DIAGNOSIS — N400 Enlarged prostate without lower urinary tract symptoms: Secondary | ICD-10-CM | POA: Insufficient documentation

## 2017-05-27 DIAGNOSIS — IMO0002 Reserved for concepts with insufficient information to code with codable children: Secondary | ICD-10-CM | POA: Diagnosis present

## 2017-05-27 DIAGNOSIS — I10 Essential (primary) hypertension: Secondary | ICD-10-CM | POA: Insufficient documentation

## 2017-05-27 DIAGNOSIS — Z79899 Other long term (current) drug therapy: Secondary | ICD-10-CM | POA: Insufficient documentation

## 2017-05-27 DIAGNOSIS — E1165 Type 2 diabetes mellitus with hyperglycemia: Secondary | ICD-10-CM | POA: Diagnosis present

## 2017-05-27 DIAGNOSIS — Z7982 Long term (current) use of aspirin: Secondary | ICD-10-CM | POA: Diagnosis not present

## 2017-05-27 DIAGNOSIS — R7989 Other specified abnormal findings of blood chemistry: Secondary | ICD-10-CM | POA: Diagnosis not present

## 2017-05-27 DIAGNOSIS — E1310 Other specified diabetes mellitus with ketoacidosis without coma: Secondary | ICD-10-CM | POA: Diagnosis not present

## 2017-05-27 DIAGNOSIS — E1100 Type 2 diabetes mellitus with hyperosmolarity without nonketotic hyperglycemic-hyperosmolar coma (NKHHC): Secondary | ICD-10-CM

## 2017-05-27 DIAGNOSIS — R4182 Altered mental status, unspecified: Secondary | ICD-10-CM | POA: Diagnosis not present

## 2017-05-27 DIAGNOSIS — Z794 Long term (current) use of insulin: Secondary | ICD-10-CM | POA: Insufficient documentation

## 2017-05-27 LAB — CBC
HCT: 27.4 % — ABNORMAL LOW (ref 39.0–52.0)
HEMOGLOBIN: 9.4 g/dL — AB (ref 13.0–17.0)
MCH: 29.6 pg (ref 26.0–34.0)
MCHC: 34.3 g/dL (ref 30.0–36.0)
MCV: 86.2 fL (ref 78.0–100.0)
PLATELETS: 319 10*3/uL (ref 150–400)
RBC: 3.18 MIL/uL — AB (ref 4.22–5.81)
RDW: 12.9 % (ref 11.5–15.5)
WBC: 9.2 10*3/uL (ref 4.0–10.5)

## 2017-05-27 LAB — BASIC METABOLIC PANEL
ANION GAP: 9 (ref 5–15)
BUN: 15 mg/dL (ref 6–20)
CO2: 24 mmol/L (ref 22–32)
CREATININE: 0.8 mg/dL (ref 0.61–1.24)
Calcium: 8.4 mg/dL — ABNORMAL LOW (ref 8.9–10.3)
Chloride: 96 mmol/L — ABNORMAL LOW (ref 101–111)
Glucose, Bld: 211 mg/dL — ABNORMAL HIGH (ref 65–99)
Potassium: 3.4 mmol/L — ABNORMAL LOW (ref 3.5–5.1)
SODIUM: 129 mmol/L — AB (ref 135–145)

## 2017-05-27 LAB — MRSA PCR SCREENING: MRSA BY PCR: NEGATIVE

## 2017-05-27 LAB — TROPONIN I: TROPONIN I: 0.06 ng/mL — AB (ref <0.03)

## 2017-05-27 LAB — GLUCOSE, CAPILLARY
GLUCOSE-CAPILLARY: 227 mg/dL — AB (ref 65–99)
GLUCOSE-CAPILLARY: 238 mg/dL — AB (ref 65–99)
Glucose-Capillary: 155 mg/dL — ABNORMAL HIGH (ref 65–99)
Glucose-Capillary: 191 mg/dL — ABNORMAL HIGH (ref 65–99)

## 2017-05-27 MED ORDER — ASPIRIN EC 81 MG PO TBEC
81.0000 mg | DELAYED_RELEASE_TABLET | Freq: Every day | ORAL | Status: DC
  Administered 2017-05-27: 81 mg via ORAL
  Filled 2017-05-27: qty 1

## 2017-05-27 MED ORDER — LISINOPRIL-HYDROCHLOROTHIAZIDE 20-25 MG PO TABS: 1.0000 | ORAL_TABLET | Freq: Every morning | ORAL | Status: DC

## 2017-05-27 MED ORDER — TRAMADOL HCL 50 MG PO TABS: 50.0000 mg | ORAL_TABLET | Freq: Four times a day (QID) | ORAL | Status: DC | PRN

## 2017-05-27 MED ORDER — ENOXAPARIN SODIUM 40 MG/0.4ML ~~LOC~~ SOLN: 40.0000 mg | SUBCUTANEOUS | Status: DC

## 2017-05-27 MED ORDER — SIMVASTATIN 20 MG PO TABS
20.0000 mg | ORAL_TABLET | Freq: Every evening | ORAL | Status: DC
  Administered 2017-05-27: 20 mg via ORAL
  Filled 2017-05-27: qty 1

## 2017-05-27 MED ORDER — CHLORPROMAZINE HCL 25 MG/ML IJ SOLN
25.0000 mg | Freq: Three times a day (TID) | INTRAMUSCULAR | Status: DC | PRN
  Administered 2017-05-27: 25 mg via INTRAMUSCULAR
  Filled 2017-05-27 (×2): qty 1

## 2017-05-27 MED ORDER — SODIUM CHLORIDE 0.9% FLUSH
3.0000 mL | Freq: Two times a day (BID) | INTRAVENOUS | Status: DC
  Administered 2017-05-27: 3 mL via INTRAVENOUS

## 2017-05-27 MED ORDER — ONDANSETRON HCL 4 MG PO TABS: 4.0000 mg | ORAL_TABLET | Freq: Four times a day (QID) | ORAL | Status: DC | PRN

## 2017-05-27 MED ORDER — INSULIN ASPART 100 UNIT/ML ~~LOC~~ SOLN: 0.0000 [IU] | Freq: Every day | SUBCUTANEOUS | Status: DC

## 2017-05-27 MED ORDER — ACETAMINOPHEN 325 MG PO TABS
650.0000 mg | ORAL_TABLET | Freq: Four times a day (QID) | ORAL | Status: DC | PRN
Start: 2017-05-27 — End: 2017-05-27

## 2017-05-27 MED ORDER — LISINOPRIL 20 MG PO TABS
20.0000 mg | ORAL_TABLET | Freq: Every day | ORAL | Status: DC
  Administered 2017-05-27: 20 mg via ORAL
  Filled 2017-05-27: qty 1

## 2017-05-27 MED ORDER — INSULIN GLARGINE 100 UNIT/ML ~~LOC~~ SOLN
16.0000 [IU] | Freq: Every day | SUBCUTANEOUS | Status: DC
Start: 2017-05-27 — End: 2017-05-27
  Administered 2017-05-27: 16 [IU] via SUBCUTANEOUS
  Filled 2017-05-27: qty 0.16

## 2017-05-27 MED ORDER — INSULIN ASPART 100 UNIT/ML ~~LOC~~ SOLN
0.0000 [IU] | Freq: Three times a day (TID) | SUBCUTANEOUS | Status: DC
  Administered 2017-05-27 (×2): 5 [IU] via SUBCUTANEOUS
  Administered 2017-05-27: 3 [IU] via SUBCUTANEOUS

## 2017-05-27 MED ORDER — HYDROCHLOROTHIAZIDE 25 MG PO TABS
25.0000 mg | ORAL_TABLET | Freq: Every day | ORAL | Status: DC
  Administered 2017-05-27: 25 mg via ORAL
  Filled 2017-05-27: qty 1

## 2017-05-27 MED ORDER — FINASTERIDE 5 MG PO TABS
5.0000 mg | ORAL_TABLET | Freq: Every evening | ORAL | Status: DC
  Administered 2017-05-27: 5 mg via ORAL
  Filled 2017-05-27: qty 1

## 2017-05-27 MED ORDER — PREGABALIN 25 MG PO CAPS
225.0000 mg | ORAL_CAPSULE | Freq: Two times a day (BID) | ORAL | Status: DC
  Administered 2017-05-27: 225 mg via ORAL
  Filled 2017-05-27: qty 9

## 2017-05-27 MED ORDER — ONDANSETRON HCL 4 MG/2ML IJ SOLN: 4.0000 mg | Freq: Four times a day (QID) | INTRAMUSCULAR | Status: DC | PRN

## 2017-05-27 MED ORDER — POTASSIUM CHLORIDE CRYS ER 20 MEQ PO TBCR
40.0000 meq | EXTENDED_RELEASE_TABLET | Freq: Once | ORAL | Status: AC
  Administered 2017-05-27: 40 meq via ORAL
  Filled 2017-05-27: qty 2

## 2017-05-27 MED ORDER — LEVOTHYROXINE SODIUM 88 MCG PO TABS
88.0000 ug | ORAL_TABLET | Freq: Every day | ORAL | Status: DC
  Administered 2017-05-27: 88 ug via ORAL
  Filled 2017-05-27: qty 1

## 2017-05-27 MED ORDER — OXYCODONE HCL 5 MG PO TABS: 2.5000 mg | ORAL_TABLET | Freq: Four times a day (QID) | ORAL | Status: DC | PRN

## 2017-05-27 MED ORDER — ALFUZOSIN HCL ER 10 MG PO TB24
10.0000 mg | ORAL_TABLET | Freq: Every day | ORAL | Status: DC
  Administered 2017-05-27: 10 mg via ORAL
  Filled 2017-05-27: qty 1

## 2017-05-27 NOTE — Discharge Instructions (Signed)

## 2017-05-27 NOTE — Discharge Summary (Addendum)
Physician Discharge Summary  Elodia FlorenceRussell F Schnabel ZOX:[id number] DOB: 12/25/1940 DOA: 05/27/2017  PCP: Lonie Peakonroy, Nathan, PA-C  Admit date: 05/27/2017 Discharge date: 05/27/2017  Recommendations for Outpatient Follow-up:  1. Pt will need to follow up with PCP in 1 week post discharge 2. Please obtain BMP to evaluate electrolytes and kidney function, Na and K level  3. Please also check CBC to evaluate Hg and Hct levels 4. Pt says he no longer takes cipro and doxy, so these medications were removed from his list   Discharge Diagnoses:  Principal Problem:   Diabetic ketoacidosis without coma associated with type 2 diabetes mellitus (HCC) Active Problems:   Hypothyroidism, acquired   Uncontrolled type 2 diabetes mellitus (HCC)   Essential hypertension   History of back surgery   Diabetic keto-acidosis (HCC)  Discharge Condition: Stable  Diet recommendation: Heart healthy diet discussed in details   History of present illness:  [age] y.o. male with a past medical history significant for IDDM, HTN, and hypothyroidism who presented with confusion.  Patient had a recent lumbar decompression by Dr. Jordan LikesPool on Monday this week, was discharged the next day uneventfully.  Since then he has been home (lives in a duplex with his son, daughter-in-law), trying to recuperate. He became more tired and confused, has not been eating.mcuh. Patient thinks he took too much pain medication.   Hospital Course:   1. DKA:  - resolved with insulin infusion  - restarted home medical regimen - pt tolerated diet well and wants to be discharged today  2. Confusion:  - Likely from opioids + diazepam in setting of DKA. - resolved - MRI brain with no acute findings, no stroke   3. Hypothyroidism:  - continue home regimen with synthroid   4. HTN:  - Continue lisinopril-HCTZ - Continue aspirin, statin  5. Recent decompression:  - provided analgesia as needed   6. Hyponatremia - from pre renal etiology -  improving  7. Hypokalemia - supplemented but pt wants to go home so will need close outpatient follow up  Procedures/Studies: Dg Lumbar Spine 2-3 Views  Result Date: 05/21/2017 CLINICAL DATA:  Lumbar fusion EXAM: LUMBAR SPINE - 2-3 VIEW; DG C-ARM 61-120 MIN COMPARISON:  None. FINDINGS: L5-S1 fusion with pedicle screws and interbody spacer. Prior superior posterior fusion decompression noted. IMPRESSION: L5-S1 fusion without complication. Electronically Signed   By: Genevive BiStewart  Edmunds M.D.   On: 05/21/2017 17:10   Mr Brain Wo Contrast  Result Date: 05/27/2017 CLINICAL DATA:  Confusion EXAM: MRI HEAD WITHOUT CONTRAST TECHNIQUE: Multiplanar, multiecho pulse sequences of the brain and surrounding structures were obtained without intravenous contrast. COMPARISON:  None. FINDINGS: Brain: No acute infarction, hemorrhage, hydrocephalus, extra-axial collection or mass lesion. Mild for age chronic microvascular ischemic change in the deep cerebral white matter. Age normal brain volume. There is a large arachnoid granulation along the right transverse sinus, with herniation of normal signal cerebellum. There is no historical indication of elevated intracranial pressure. Vascular: Major flow voids are preserved Skull and upper cervical spine: Negative for marrow lesion Sinuses/Orbits: Sequela of chronic right maxillary sinusitis with atelectasis. No acute finding. IMPRESSION: 1. No acute finding. 2. Age congruent senescent changes. 3. Large right transverse arachnoid granulation containing herniated cerebellum, of doubtful clinical significance in this case. Electronically Signed   By: Marnee SpringJonathon  Watts M.D.   On: 05/27/2017 14:12   Dg C-arm 1-60 Min  Result Date: 05/21/2017 CLINICAL DATA:  Lumbar fusion EXAM: LUMBAR SPINE - 2-3 VIEW; DG C-ARM 61-120 MIN COMPARISON:  None. FINDINGS: L5-S1 fusion with pedicle screws and interbody spacer. Prior superior posterior fusion decompression noted. IMPRESSION: L5-S1 fusion  without complication. Electronically Signed   By: Genevive Bi M.D.   On: 05/21/2017 17:10     Discharge Exam: Vitals:   05/27/17 1018 05/27/17 1019  BP: (!) 161/54 124/66  Pulse:  94  Resp:  15  Temp:     Vitals:   05/27/17 0334 05/27/17 0718 05/27/17 1018 05/27/17 1019  BP: 130/67 131/70 (!) 161/54 124/66  Pulse: 96 90  94  Resp: (!) 22 20  15   Temp: 97.6 F (36.4 C) 98.1 F (36.7 C)    TempSrc: Oral Oral    SpO2: 100% 98%  94%  Weight: 75.5 kg (166 lb 7.2 oz)     Height: 5\' 9"  (1.753 m)       General: Pt is alert, follows commands appropriately, not in acute distress Cardiovascular: Regular rate and rhythm, S1/S2 +, no murmurs, no rubs, no gallops Respiratory: Clear to auscultation bilaterally, no wheezing, no crackles, no rhonchi Abdominal: Soft, non tender, non distended, bowel sounds +, no guarding  Discharge Instructions  Discharge Instructions    Diet - low sodium heart healthy    Complete by:  As directed    Increase activity slowly    Complete by:  As directed      Allergies as of 05/27/2017      Reactions   Amoxicillin Rash, Other (See Comments)   Yeast infection Has patient had a PCN reaction causing immediate rash, facial/tongue/throat swelling, SOB or lightheadedness with hypotension: No Has patient had a PCN reaction causing severe rash involving mucus membranes or skin necrosis: No Has patient had a PCN reaction that required hospitalization No Has patient had a PCN reaction occurring within the last 10 years: Yes If all of the above answers are "NO", then may proceed with Cephalosporin use.      Medication List    STOP taking these medications   ciprofloxacin 750 MG tablet Commonly known as:  CIPRO   doxycycline 100 MG tablet Commonly known as:  VIBRA-TABS   oxyCODONE 5 MG immediate release tablet Commonly known as:  Oxy IR/ROXICODONE     TAKE these medications   aspirin EC 81 MG tablet Take 81 mg by mouth daily.   Cyanocobalamin  2500 MCG Chew Chew 2,500 mcg by mouth daily.   cyclobenzaprine 10 MG tablet Commonly known as:  FLEXERIL Take 10 mg by mouth 2 (two) times daily as needed for muscle spasms.   diazepam 5 MG tablet Commonly known as:  VALIUM Take 1-2 tablets (5-10 mg total) by mouth every 6 (six) hours as needed for muscle spasms.   diclofenac 75 MG EC tablet Commonly known as:  VOLTAREN Take 75 mg by mouth 2 (two) times daily.   diclofenac sodium 1 % Gel Commonly known as:  VOLTAREN Apply 1 application topically 4 (four) times daily as needed (pain).   docusate sodium 100 MG capsule Commonly known as:  COLACE Take 1 capsule (100 mg total) by mouth 2 (two) times daily.   finasteride 5 MG tablet Commonly known as:  PROSCAR Take 5 mg by mouth every evening.   HYDROcodone-acetaminophen 10-325 MG tablet Commonly known as:  NORCO Take 1-2 tablets by mouth every 4 (four) hours as needed (breakthrough pain).   Insulin Glargine 100 UNIT/ML Solostar Pen Commonly known as:  LANTUS SOLOSTAR Inject 55 Units into the skin daily. Per sliding scale What changed:  how much  to take  when to take this  additional instructions   levothyroxine 88 MCG tablet Commonly known as:  SYNTHROID, LEVOTHROID Take 88 mcg by mouth daily before breakfast.   lisinopril-hydrochlorothiazide 20-25 MG tablet Commonly known as:  PRINZIDE,ZESTORETIC Take 1 tablet by mouth every evening.   Magnesium 250 MG Tabs Take 250 mg by mouth daily.   metFORMIN 1000 MG tablet Commonly known as:  GLUCOPHAGE Take 1,000 mg by mouth 2 (two) times daily with a meal.   MULTIVITAMIN PO Take 1 tablet by mouth daily.   NOVOLOG FLEXPEN 100 UNIT/ML FlexPen Generic drug:  insulin aspart Inject 8-10 Units into the skin 2 (two) times daily as needed for high blood sugar. PER SLIDING SCALE   polyethylene glycol packet Commonly known as:  MIRALAX / GLYCOLAX Take 17 g by mouth 2 (two) times daily. What changed:  when to take this    Potassium 99 MG Tabs Take 198 mg by mouth 2 (two) times daily.   pregabalin 225 MG capsule Commonly known as:  LYRICA Take 225 mg by mouth 2 (two) times daily.   Probiotic Caps Take 1 capsule by mouth daily.   simvastatin 20 MG tablet Commonly known as:  ZOCOR Take 20 mg by mouth every evening.   UROXATRAL 10 MG 24 hr tablet Generic drug:  alfuzosin Take 10 mg by mouth daily.   Vitamin D3 1000 units Caps Take 1,000 Units by mouth daily.      Follow-up Information    Lonie Peak, PA-C Follow up.   Specialty:  Physician Assistant Contact information: [address] [place] [address] [phone]        Dorothea Ogle, MD Follow up.   Specialty:  Internal Medicine Why:  call my cell phone with questions [phone] Contact information: [address] [place] [address] [phone]            The results of significant diagnostics from this hospitalization (including imaging, microbiology, ancillary and laboratory) are listed below for reference.     Microbiology: Recent Results (from the past 240 hour(s))  Surgical pcr screen     Status: None   Collection Time: 05/21/17  2:23 PM  Result Value Ref Range Status   MRSA, PCR NEGATIVE NEGATIVE Final   Staphylococcus aureus NEGATIVE NEGATIVE Final    Comment:        The Xpert SA Assay (FDA approved for NASAL specimens in patients over 29 years of age), is one component of a comprehensive surveillance program.  Test performance has been validated by [facility] for patients greater than or equal to 57 year old. It is not intended to diagnose infection nor to guide or monitor treatment.   MRSA PCR Screening     Status: None   Collection Time: 05/27/17  3:42 AM  Result Value Ref Range Status   MRSA by PCR NEGATIVE NEGATIVE Final    Comment:        The GeneXpert MRSA Assay (FDA approved for NASAL specimens only), is one component of a comprehensive MRSA  colonization surveillance program. It is not intended to diagnose MRSA infection nor to guide or monitor treatment for MRSA infections.      Labs: Basic Metabolic Panel:  Recent Labs Lab 05/21/17 1304 05/27/17 0457  NA 127* 129*  K 4.7 3.4*  CL 92* 96*  CO2 25 24  GLUCOSE 160* 211*  BUN 12 15  CREATININE 0.82 0.80  CALCIUM 9.5 8.4*   CBC:  Recent Labs Lab 05/21/17 1304 05/27/17 0457  WBC 6.2 9.2  NEUTROABS 4.2  --   HGB 13.2 9.4*  HCT 38.9* 27.4*  MCV 87.2 86.2  PLT 244 319   Cardiac Enzymes:  Recent Labs Lab 05/27/17 0457  TROPONINI 0.06*   CBG:  Recent Labs Lab 05/22/17 0754 05/22/17 1154 05/27/17 0338 05/27/17 0716 05/27/17 1158  GLUCAP 195* 281* 155* 227* 238*   SIGNED: Time coordinating discharge: 50 minutes  Debbora Presto, MD  [facility] 05/27/2017, 4:55 PM Pager [phone]  If 7PM-7AM, please contact night-coverage [email or web address] Password [email or web address]

## 2017-05-27 NOTE — H&P (Signed)
History and Physical  Patient Name: David Mathews     ONG:[id number]    DOB: December 16, 1940    DOA: 05/27/2017 PCP: Lonie Peak, PA-C  Patient coming from: [facility]  Chief Complaint: Confusion      HPI: David Mathews is a 76 y.o. male with a past medical history significant for IDDM, HTN, and hypothyroidism who presents with confusion.  Patient had a recent lumbar decompression by Dr. Jordan Likes on Monday this week, was discharged the next day uneventfully.  Since then he has been home (lives in a duplex with his son, daughter-in-law), trying to recuperate.  Patient thinks he was taking too much of his pain medicines, doesn't think he was keeping up with his metformin and insulin.  Today had polyuria, confusion, so his son brought him to [facility] ER.  ED course: -Afebrile, heart rate 85, respirations 17, blood pressure 111/64, pulse ox normal -Na 130, K 4.7, Cr 0.8, bicarb 21, WBC 11.8K, Hgb 11.4 -Anion gap 23, glucose 441 -UA showed ketones, glucosuria -Troponin 2x ULN, repeat downtrending at four hours -He was given subQ insulin -Transfer was requested, unclear reason for transport           ROS: Review of Systems  Constitutional: Negative for fever.  Musculoskeletal: Negative for back pain.  Endo/Heme/Allergies:       Polyuria  All other systems reviewed and are negative.         Past Medical History:  Diagnosis Date  . Arthritis   . BPH (benign prostatic hyperplasia)   . Complication of anesthesia February 28, 2016   "PT STATES HARD TO PUT TO SLEEP WITH LAST SURGERY"  . Constipation   . Diabetes mellitus without complication (HCC)    Type II  . Head injury with loss of consciousness (HCC)    playing football  . Hypertension   . Hypothyroidism   . Infected prosthetic knee joint, right unicompartmental knee  06/14/2015  . MSSA (methicillin susceptible Staphylococcus aureus) infection 07/19/2015  . Neuromuscular disorder (HCC)    neuropathy FINGERS TIPS  LEFT HAND, AND FEET  . PONV (postoperative nausea and vomiting)    ether  . Postoperative wound infection 07/19/2015    Past Surgical History:  Procedure Laterality Date  . ARTHROSCOPIC KNEE SURGERY Right 1984  . BACK SURGERY  10/2010    hx of 2 fusions LOWER L 2, 3, 4 AND 5  . CARPAL TUNNEL RELEASE Left 2010,2015   x 2  . CARPAL TUNNEL RELEASE Right 2009  . COLONOSCOPY    . I&D EXTREMITY Right 06/15/2015   Procedure: IRRIGATION AND DEBRIDEMENT OF INFECTED PARTIAL RIGHT  KNEE AND POLY EXCHANGE, RIGHT KNEE SYNOVECTOMY;  Surgeon: Teryl Lucy, MD;  Location: [facility] OR;  Service: Orthopedics;  Laterality: Right;  . LUMBAR WOUND DEBRIDEMENT N/A 06/15/2015   Procedure: LUMBAR WOUND IRRIGATION AND DEBRIDEMENT;  Surgeon: Aliene Beams, MD;  Location: [facility] OR;  Service: Neurosurgery;  Laterality: N/A;  . PARTIAL KNEE ARTHROPLASTY Bilateral 09/2009  . PARTIAL KNEE ARTHROPLASTY Right 02/28/2016   Procedure: RESECTION OF RIGHT UNI KNEE WITH PLACMENT WITH ANTIBIOTIC SPACERS;  Surgeon: Durene Romans, MD;  Location: [facility] ORS;  Service: Orthopedics;  Laterality: Right;  . PICC LINE TO RIGHT UPPER ARM  March 02, 2016  . TOTAL KNEE REVISION Right 05/01/2016   Procedure: TOTAL KNEE REVISION REIMPLANTATION;  Surgeon: Durene Romans, MD;  Location: [facility] ORS;  Service: Orthopedics;  Laterality: Right;  Adductor block in holding    Social History: Patient lives in  a duplex with his son.  The patient walks unassisted.  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  Nonsmoker.  Allergies  Allergen Reactions  . Amoxicillin Rash and Other (See Comments)    INTOLERANCE >> YEAST INFECTION  Has patient had a PCN reaction causing immediate rash, facial/tongue/throat swelling, SOB or lightheadedness with hypotension: No Has patient had a PCN reaction causing severe rash involving mucus membranes or skin necrosis: No Has patient had a PCN reaction that required hospitalization No Has patient had a PCN  reaction occurring within the last 10 years: Yes If all of the above answers are "NO", then may proceed with Cephalosporin use.    Family history: family history includes Cirrhosis in his father; Diabetes in his sister; Heart disease in his brother and father.  Prior to Admission medications   Medication Sig Start Date End Date Taking? Authorizing Provider  alfuzosin (UROXATRAL) 10 MG 24 hr tablet Take 10 mg by mouth daily.    [provider]  aspirin EC 81 MG tablet Take 81 mg by mouth daily.    [provider]  Cholecalciferol (VITAMIN D3) 1000 units CAPS Take 1,000 Units by mouth daily.    [provider]  ciprofloxacin (CIPRO) 750 MG tablet Take 1 tablet (750 mg total) by mouth 2 (two) times daily. 05/02/16   Lanney GinsBabish, Matthew, PA-C  Cyanocobalamin 2500 MCG CHEW Chew 2,500 mcg by mouth daily.    [provider]  cyclobenzaprine (FLEXERIL) 10 MG tablet Take 10 mg by mouth 2 (two) times daily as needed for muscle spasms.    [provider]  diazepam (VALIUM) 5 MG tablet Take 1-2 tablets (5-10 mg total) by mouth every 6 (six) hours as needed for muscle spasms. 05/22/17   Julio SicksPool, Henry, MD  diclofenac (VOLTAREN) 75 MG EC tablet Take 75 mg by mouth 2 (two) times daily.    [provider]  diclofenac sodium (VOLTAREN) 1 % GEL Apply 1 application topically 4 (four) times daily as needed (pain).    [provider]  docusate sodium (COLACE) 100 MG capsule Take 1 capsule (100 mg total) by mouth 2 (two) times daily. Patient taking differently: Take 100 mg by mouth daily.  03/01/16   Lanney GinsBabish, Matthew, PA-C  doxycycline (VIBRA-TABS) 100 MG tablet Take 1 tablet (100 mg total) by mouth every 12 (twelve) hours. 05/02/16   Lanney GinsBabish, Matthew, PA-C  finasteride (PROSCAR) 5 MG tablet Take 5 mg by mouth every evening.     [provider]  fluticasone (FLONASE) 50 MCG/ACT nasal spray Place 1 spray into both nostrils daily as needed for allergies.     [provider]  HYDROcodone-acetaminophen (NORCO) 10-325 MG tablet Take 1-2 tablets by mouth every 4 (four) hours as needed (breakthrough pain). 05/22/17   Julio SicksPool, Henry, MD  insulin aspart (NOVOLOG FLEXPEN) 100 UNIT/ML FlexPen Inject 8-10 Units into the skin 4 (four) times daily as needed for high blood sugar. Use a sliding scale.    [provider]  Insulin Glargine (LANTUS SOLOSTAR) 100 UNIT/ML Solostar Pen Inject 55 Units into the skin daily. Per sliding scale Patient taking differently: Inject 16 Units into the skin daily.  06/18/15   Leroy SeaSingh, Prashant K, MD  levothyroxine (SYNTHROID, LEVOTHROID) 88 MCG tablet Take 88 mcg by mouth daily before breakfast.    [provider]  lisinopril-hydrochlorothiazide (PRINZIDE,ZESTORETIC) 20-25 MG per tablet Take 1 tablet by mouth every morning.     [provider]  Loratadine 10 MG CAPS Take  10 mg by mouth every evening.     [provider]  Magnesium 250 MG TABS Take 250 mg by mouth daily.    [provider]  metformin (FORTAMET) 1000 MG (OSM) 24 hr tablet Take 1,000 mg by mouth 2 (two) times daily with a meal.    [provider]  metFORMIN (GLUCOPHAGE) 1000 MG tablet Take 1,000 mg by mouth 2 (two) times daily with a meal. 04/05/16 05/17/17  [provider]  Multiple Vitamins-Minerals (MULTIVITAMIN PO) Take 1 tablet by mouth daily.    [provider]  oxyCODONE (OXY IR/ROXICODONE) 5 MG immediate release tablet Take 1-2 tablets (5-10 mg total) by mouth every 4 (four) hours as needed for severe pain. 09/06/16   Julio Sicks, MD  polyethylene glycol (MIRALAX / GLYCOLAX) packet Take 17 g by mouth 2 (two) times daily. Patient taking differently: Take 17 g by mouth 2 (two) times daily as needed for mild constipation.  03/01/16   Lanney Gins, PA-C  Potassium 99 MG TABS Take 198 mg by mouth 2 (two) times daily.    [provider]  pregabalin (LYRICA) 225 MG capsule Take 225 mg by  mouth 2 (two) times daily.    [provider]  Probiotic CAPS Take 1 capsule by mouth daily.    [provider]  simvastatin (ZOCOR) 20 MG tablet Take 20 mg by mouth every evening.     [provider]       Physical Exam: BP 130/67 (BP Location: Right Arm)   Pulse 96   Temp 97.6 F (36.4 C) (Oral)   Resp (!) 22   Ht 5\' 9"  (1.753 m)   Wt 75.5 kg (166 lb 7.2 oz)   SpO2 100%   BMI 24.58 kg/m  General appearance: Well-developed, thin adult male, alert and in no acute distress, a little tangential and occasionally answers wrong questions.   Eyes: Anicteric, conjunctiva pink, lids and lashes normal. PERRL.    ENT: No nasal deformity, discharge, epistaxis.  Hearing normal. OP moist without lesions.  Upper dentures. Neck: No neck masses.  Trachea midline.  No thyromegaly/tenderness. Lymph: No cervical or supraclavicular lymphadenopathy. Skin: Warm and dry.  No jaundice.  No suspicious rashes or lesions. Cardiac: RRR, nl S1-S2, no murmurs appreciated.  Capillary refill is brisk.  JVP normal.  No LE edema.  Radial and DP pulses 2+ and symmetric. Respiratory: Normal respiratory rate and rhythm.  CTAB without rales or wheezes. Abdomen: Abdomen soft.   No TTP. No ascites, distension, hepatosplenomegaly.   MSK: No deformities or effusions.  No cyanosis or clubbing. Neuro: Cranial nerves normal.  Sensation intact to light touch. Speech is fluent.  Muscle strength normal.    Psych: Sensorium intact and responding to questions, attention slightly diminished.  Behavior slightly odd.  Affect normal.  Judgment and insight appear normal.     Labs on Admission:  I have personally reviewed following labs and imaging studies: CBC:  Recent Labs Lab 05/21/17 1304  WBC 6.2  NEUTROABS 4.2  HGB 13.2  HCT 38.9*  MCV 87.2  PLT 244   Basic Metabolic Panel:  Recent Labs Lab 05/21/17 1304  NA 127*  K 4.7  CL 92*  CO2 25  GLUCOSE 160*  BUN 12  CREATININE 0.82    CALCIUM 9.5   GFR: Estimated Creatinine Clearance: 77.8 mL/min (by C-G formula based on SCr of 0.82 mg/dL).  Liver Function Tests: No results for input(s): AST, ALT, ALKPHOS, BILITOT, PROT, ALBUMIN in the  last 168 hours. No results for input(s): LIPASE, AMYLASE in the last 168 hours. No results for input(s): AMMONIA in the last 168 hours. Coagulation Profile: No results for input(s): INR, PROTIME in the last 168 hours. Cardiac Enzymes: No results for input(s): CKTOTAL, CKMB, CKMBINDEX, TROPONINI in the last 168 hours. BNP (last 3 results) No results for input(s): PROBNP in the last 8760 hours. HbA1C: No results for input(s): HGBA1C in the last 72 hours. CBG:  Recent Labs Lab 05/21/17 1753 05/21/17 2129 05/22/17 0754 05/22/17 1154 05/27/17 0338  GLUCAP 138* 296* 195* 281* 155*   Lipid Profile: No results for input(s): CHOL, HDL, LDLCALC, TRIG, CHOLHDL, LDLDIRECT in the last 72 hours. Thyroid Function Tests: No results for input(s): TSH, T4TOTAL, FREET4, T3FREE, THYROIDAB in the last 72 hours. Anemia Panel: No results for input(s): VITAMINB12, FOLATE, FERRITIN, TIBC, IRON, RETICCTPCT in the last 72 hours. Sepsis Labs:  Invalid input(s): PROCALCITONIN, LACTICIDVEN Recent Results (from the past 240 hour(s))  Surgical pcr screen     Status: None   Collection Time: 05/21/17  2:23 PM  Result Value Ref Range Status   MRSA, PCR NEGATIVE NEGATIVE Final   Staphylococcus aureus NEGATIVE NEGATIVE Final    Comment:        The Xpert SA Assay (FDA approved for NASAL specimens in patients over 70 years of age), is one component of a comprehensive surveillance program.  Test performance has been validated by [facility] for patients greater than or equal to 6 year old. It is not intended to diagnose infection nor to guide or monitor treatment.          Radiological Exams on Admission: Personally reviewed CXR and CT head reports.  CXR showed no pneumonia or edema.  CT head  report describes "Small volume extra-axial pneumocephalus likely attributable to recent lumbar spine surgery. This suggests dural tear. No associated mass effect."  EKG: T wave flattening in inferior leads, similar to previous.    Assessment/Plan  1. DKA:  This was resolved by the time of transfer.   -Restart lantus -Check BMP now -SSI with meals -Continue pregabalin for neuropathy  2. Confusion:  Likely from opioids + diazepam in setting of DKA. -Hold diazepam -Cautious pain medication -DKA resolved  3. Hypothyroidism:  -Continue levothyroxine  4. HTN:  -Continue lisinopril-HCTZ -Continue aspirin, statin  5. Other medications:  -Continue alfuzosin -Continue finasteride  6. Recent decompression:  -Hold Valium -Pain control with acetaminophen, tramadol or oxycodone          DVT prophylaxis: SCDs Code Status: FULL  Family Communication: None present  Disposition Plan: Anticipate restarting glargine, check BMP.  MOnitor mental status.  If back to normal, home. Consults called: None Admission status: OBS At the point of initial evaluation, it is my clinical opinion that admission for OBSERVATION is reasonable and necessary because the patient's presenting complaints in the context of their chronic conditions represent sufficient risk of deterioration or significant morbidity to constitute reasonable grounds for close observation in the hospital setting, but that the patient may be medically stable for discharge from the hospital within 24 to 48 hours.    Medical decision making: Patient seen at 4:00 AM on 05/27/2017.  What exists of the patient's chart was reviewed in depth and summarized above.  Clinical condition: stable.        Alberteen Sam [facility] Pager [phone]

## 2017-05-28 LAB — HEMOGLOBIN A1C
Hgb A1c MFr Bld: 8.2 % — ABNORMAL HIGH (ref 4.8–5.6)
Mean Plasma Glucose: 189 mg/dL

## 2017-06-08 DIAGNOSIS — E876 Hypokalemia: Secondary | ICD-10-CM | POA: Diagnosis not present

## 2017-06-08 DIAGNOSIS — Z79899 Other long term (current) drug therapy: Secondary | ICD-10-CM | POA: Diagnosis not present

## 2017-06-08 DIAGNOSIS — E871 Hypo-osmolality and hyponatremia: Secondary | ICD-10-CM | POA: Diagnosis not present

## 2017-06-08 DIAGNOSIS — R609 Edema, unspecified: Secondary | ICD-10-CM | POA: Diagnosis not present

## 2017-06-08 DIAGNOSIS — M545 Low back pain: Secondary | ICD-10-CM | POA: Diagnosis not present

## 2017-06-08 DIAGNOSIS — E1140 Type 2 diabetes mellitus with diabetic neuropathy, unspecified: Secondary | ICD-10-CM | POA: Diagnosis not present

## 2017-06-26 DIAGNOSIS — E785 Hyperlipidemia, unspecified: Secondary | ICD-10-CM | POA: Diagnosis not present

## 2017-06-26 DIAGNOSIS — Z9181 History of falling: Secondary | ICD-10-CM | POA: Diagnosis not present

## 2017-06-26 DIAGNOSIS — Z0000 Encounter for general adult medical examination without abnormal findings: Secondary | ICD-10-CM | POA: Diagnosis not present

## 2017-06-26 DIAGNOSIS — Z136 Encounter for screening for cardiovascular disorders: Secondary | ICD-10-CM | POA: Diagnosis not present

## 2017-06-26 DIAGNOSIS — Z1389 Encounter for screening for other disorder: Secondary | ICD-10-CM | POA: Diagnosis not present

## 2017-06-26 DIAGNOSIS — Z125 Encounter for screening for malignant neoplasm of prostate: Secondary | ICD-10-CM | POA: Diagnosis not present

## 2017-06-28 DIAGNOSIS — M48061 Spinal stenosis, lumbar region without neurogenic claudication: Secondary | ICD-10-CM | POA: Diagnosis not present

## 2017-06-28 DIAGNOSIS — M5126 Other intervertebral disc displacement, lumbar region: Secondary | ICD-10-CM | POA: Diagnosis not present

## 2017-07-26 DIAGNOSIS — M48061 Spinal stenosis, lumbar region without neurogenic claudication: Secondary | ICD-10-CM | POA: Diagnosis not present

## 2017-08-06 DIAGNOSIS — E1140 Type 2 diabetes mellitus with diabetic neuropathy, unspecified: Secondary | ICD-10-CM | POA: Diagnosis not present

## 2017-08-08 DIAGNOSIS — E119 Type 2 diabetes mellitus without complications: Secondary | ICD-10-CM | POA: Diagnosis not present

## 2017-08-08 DIAGNOSIS — H47323 Drusen of optic disc, bilateral: Secondary | ICD-10-CM | POA: Diagnosis not present

## 2017-08-08 DIAGNOSIS — H2513 Age-related nuclear cataract, bilateral: Secondary | ICD-10-CM | POA: Diagnosis not present

## 2017-08-08 DIAGNOSIS — Z794 Long term (current) use of insulin: Secondary | ICD-10-CM | POA: Diagnosis not present

## 2017-08-23 DIAGNOSIS — I10 Essential (primary) hypertension: Secondary | ICD-10-CM | POA: Diagnosis not present

## 2017-08-23 DIAGNOSIS — M48061 Spinal stenosis, lumbar region without neurogenic claudication: Secondary | ICD-10-CM | POA: Diagnosis not present

## 2017-08-23 DIAGNOSIS — Z6827 Body mass index (BMI) 27.0-27.9, adult: Secondary | ICD-10-CM | POA: Diagnosis not present

## 2017-10-15 DIAGNOSIS — Z125 Encounter for screening for malignant neoplasm of prostate: Secondary | ICD-10-CM | POA: Diagnosis not present

## 2017-10-15 DIAGNOSIS — R609 Edema, unspecified: Secondary | ICD-10-CM | POA: Diagnosis not present

## 2017-10-15 DIAGNOSIS — E1140 Type 2 diabetes mellitus with diabetic neuropathy, unspecified: Secondary | ICD-10-CM | POA: Diagnosis not present

## 2017-10-15 DIAGNOSIS — E039 Hypothyroidism, unspecified: Secondary | ICD-10-CM | POA: Diagnosis not present

## 2017-10-15 DIAGNOSIS — E7800 Pure hypercholesterolemia, unspecified: Secondary | ICD-10-CM | POA: Diagnosis not present

## 2017-10-15 DIAGNOSIS — Z79899 Other long term (current) drug therapy: Secondary | ICD-10-CM | POA: Diagnosis not present

## 2017-10-15 DIAGNOSIS — M545 Low back pain: Secondary | ICD-10-CM | POA: Diagnosis not present

## 2017-10-25 DIAGNOSIS — M48061 Spinal stenosis, lumbar region without neurogenic claudication: Secondary | ICD-10-CM | POA: Diagnosis not present

## 2018-01-18 HISTORY — PX: CATARACT EXTRACTION: SUR2

## 2018-01-24 DIAGNOSIS — H2513 Age-related nuclear cataract, bilateral: Secondary | ICD-10-CM | POA: Diagnosis not present

## 2018-01-24 DIAGNOSIS — E119 Type 2 diabetes mellitus without complications: Secondary | ICD-10-CM | POA: Diagnosis not present

## 2018-01-24 DIAGNOSIS — H47323 Drusen of optic disc, bilateral: Secondary | ICD-10-CM | POA: Diagnosis not present

## 2018-02-06 DIAGNOSIS — M545 Low back pain: Secondary | ICD-10-CM | POA: Diagnosis not present

## 2018-02-06 DIAGNOSIS — R609 Edema, unspecified: Secondary | ICD-10-CM | POA: Diagnosis not present

## 2018-02-06 DIAGNOSIS — Z79899 Other long term (current) drug therapy: Secondary | ICD-10-CM | POA: Diagnosis not present

## 2018-02-06 DIAGNOSIS — E1140 Type 2 diabetes mellitus with diabetic neuropathy, unspecified: Secondary | ICD-10-CM | POA: Diagnosis not present

## 2018-02-06 DIAGNOSIS — E039 Hypothyroidism, unspecified: Secondary | ICD-10-CM | POA: Diagnosis not present

## 2018-02-06 DIAGNOSIS — E7800 Pure hypercholesterolemia, unspecified: Secondary | ICD-10-CM | POA: Diagnosis not present

## 2018-02-06 DIAGNOSIS — H269 Unspecified cataract: Secondary | ICD-10-CM | POA: Diagnosis not present

## 2018-02-06 DIAGNOSIS — Z6828 Body mass index (BMI) 28.0-28.9, adult: Secondary | ICD-10-CM | POA: Diagnosis not present

## 2018-02-14 DIAGNOSIS — Z880 Allergy status to penicillin: Secondary | ICD-10-CM | POA: Diagnosis not present

## 2018-02-14 DIAGNOSIS — E1140 Type 2 diabetes mellitus with diabetic neuropathy, unspecified: Secondary | ICD-10-CM | POA: Diagnosis not present

## 2018-02-14 DIAGNOSIS — E039 Hypothyroidism, unspecified: Secondary | ICD-10-CM | POA: Diagnosis not present

## 2018-02-14 DIAGNOSIS — Z794 Long term (current) use of insulin: Secondary | ICD-10-CM | POA: Diagnosis not present

## 2018-02-14 DIAGNOSIS — H269 Unspecified cataract: Secondary | ICD-10-CM | POA: Diagnosis not present

## 2018-02-19 DIAGNOSIS — M48061 Spinal stenosis, lumbar region without neurogenic claudication: Secondary | ICD-10-CM | POA: Diagnosis not present

## 2018-02-21 DIAGNOSIS — E871 Hypo-osmolality and hyponatremia: Secondary | ICD-10-CM | POA: Diagnosis not present

## 2018-04-09 DIAGNOSIS — R609 Edema, unspecified: Secondary | ICD-10-CM | POA: Diagnosis not present

## 2018-04-09 DIAGNOSIS — E1140 Type 2 diabetes mellitus with diabetic neuropathy, unspecified: Secondary | ICD-10-CM | POA: Diagnosis not present

## 2018-04-09 DIAGNOSIS — Z01818 Encounter for other preprocedural examination: Secondary | ICD-10-CM | POA: Diagnosis not present

## 2018-04-09 DIAGNOSIS — H269 Unspecified cataract: Secondary | ICD-10-CM | POA: Diagnosis not present

## 2018-04-11 DIAGNOSIS — E1136 Type 2 diabetes mellitus with diabetic cataract: Secondary | ICD-10-CM | POA: Diagnosis not present

## 2018-04-11 DIAGNOSIS — Z880 Allergy status to penicillin: Secondary | ICD-10-CM | POA: Diagnosis not present

## 2018-04-11 DIAGNOSIS — E785 Hyperlipidemia, unspecified: Secondary | ICD-10-CM | POA: Diagnosis not present

## 2018-04-11 DIAGNOSIS — E1140 Type 2 diabetes mellitus with diabetic neuropathy, unspecified: Secondary | ICD-10-CM | POA: Diagnosis not present

## 2018-04-11 DIAGNOSIS — I10 Essential (primary) hypertension: Secondary | ICD-10-CM | POA: Diagnosis not present

## 2018-06-04 DIAGNOSIS — M48061 Spinal stenosis, lumbar region without neurogenic claudication: Secondary | ICD-10-CM | POA: Diagnosis not present

## 2018-06-04 DIAGNOSIS — Z6828 Body mass index (BMI) 28.0-28.9, adult: Secondary | ICD-10-CM | POA: Diagnosis not present

## 2018-06-10 ENCOUNTER — Other Ambulatory Visit: Payer: Self-pay | Admitting: Neurosurgery

## 2018-06-10 DIAGNOSIS — M48061 Spinal stenosis, lumbar region without neurogenic claudication: Secondary | ICD-10-CM

## 2018-06-14 DIAGNOSIS — Z79899 Other long term (current) drug therapy: Secondary | ICD-10-CM | POA: Diagnosis not present

## 2018-06-14 DIAGNOSIS — Z1339 Encounter for screening examination for other mental health and behavioral disorders: Secondary | ICD-10-CM | POA: Diagnosis not present

## 2018-06-14 DIAGNOSIS — E039 Hypothyroidism, unspecified: Secondary | ICD-10-CM | POA: Diagnosis not present

## 2018-06-14 DIAGNOSIS — E1140 Type 2 diabetes mellitus with diabetic neuropathy, unspecified: Secondary | ICD-10-CM | POA: Diagnosis not present

## 2018-06-14 DIAGNOSIS — I10 Essential (primary) hypertension: Secondary | ICD-10-CM | POA: Diagnosis not present

## 2018-06-14 DIAGNOSIS — E7800 Pure hypercholesterolemia, unspecified: Secondary | ICD-10-CM | POA: Diagnosis not present

## 2018-06-17 ENCOUNTER — Ambulatory Visit
Admission: RE | Admit: 2018-06-17 | Discharge: 2018-06-17 | Disposition: A | Discharge status: Home / Self Care | Payer: Medicare Other | Source: Ambulatory Visit | Attending: Neurosurgery | Admitting: Neurosurgery

## 2018-06-17 DIAGNOSIS — M48061 Spinal stenosis, lumbar region without neurogenic claudication: Secondary | ICD-10-CM

## 2018-06-17 DIAGNOSIS — M5126 Other intervertebral disc displacement, lumbar region: Secondary | ICD-10-CM | POA: Diagnosis not present

## 2018-06-17 MED ORDER — ONDANSETRON HCL 4 MG/2ML IJ SOLN: 4.0000 mg | Freq: Four times a day (QID) | INTRAMUSCULAR | Status: DC | PRN

## 2018-06-17 MED ORDER — IOPAMIDOL (ISOVUE-M 200) INJECTION 41%
18.0000 mL | Freq: Once | INTRAMUSCULAR | Status: AC
  Administered 2018-06-17: 18 mL via INTRATHECAL

## 2018-06-17 MED ORDER — DIAZEPAM 5 MG PO TABS: 5.0000 mg | ORAL_TABLET | Freq: Once | ORAL | Status: DC

## 2018-06-17 NOTE — Discharge Instructions (Signed)

## 2018-06-29 IMAGING — MR MR HEAD W/O CM
9 of 10 series · 38 of 48 positions shown · non-contrast
Comparison: None.

CLINICAL DATA: Confusion

EXAM:
MRI HEAD WITHOUT CONTRAST
TECHNIQUE: Multiplanar, multiecho pulse sequences of the brain and surrounding
structures were obtained without intravenous contrast.

[Series 3: DWI · axial · 3.0mm · 1.09mm/px · z∈[-69,+78]mm · 11 of 100 slices shown (1 of 4)]
[im 1/100]
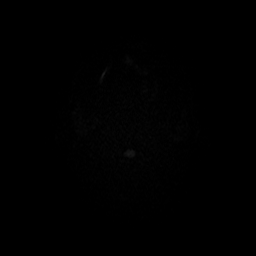
[im 10/100]
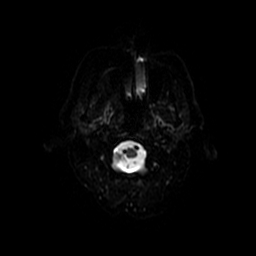
[im 20/100]
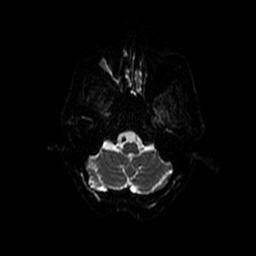
[im 30/100]
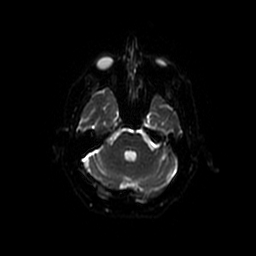
[im 40/100]
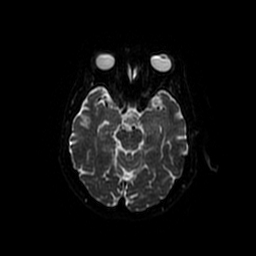
[im 50/100]
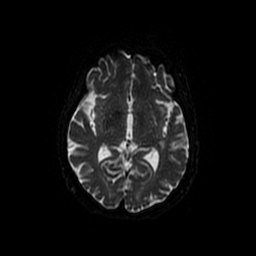
[im 60/100]
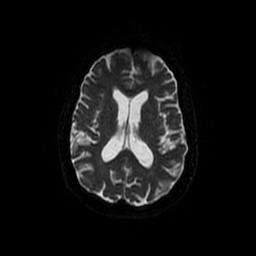
[im 70/100]
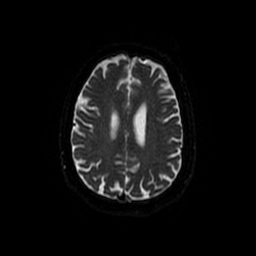
[im 80/100]
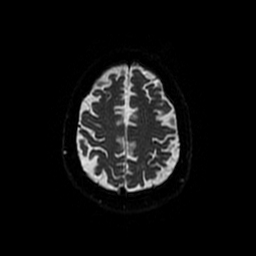
[im 90/100]
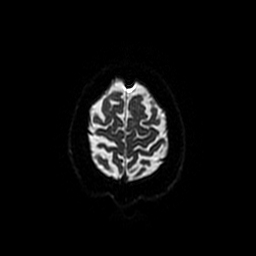
[im 100/100]
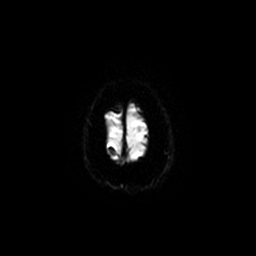

[Series 4: T1 · sagittal · 5.0mm · 0.47mm/px · 2 of 23 slices shown]
[im 1/23]
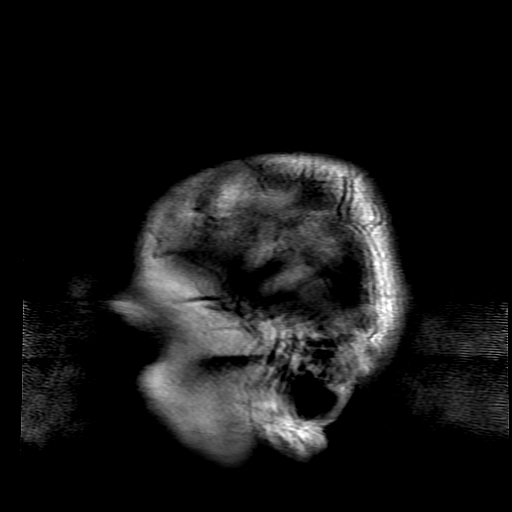
[im 23/23]
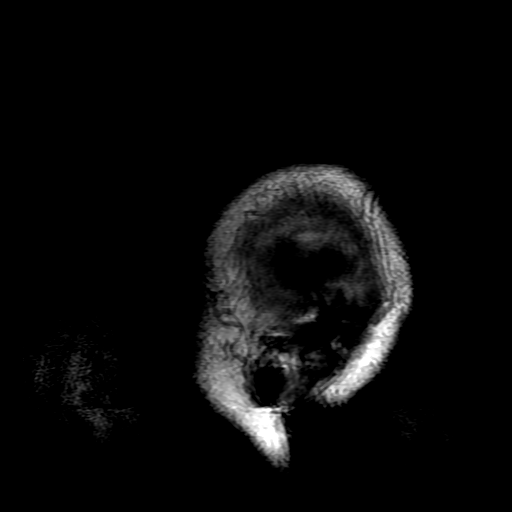

[Series 5: T2 · axial · 5.0mm · 0.45mm/px · z∈[-64,+80]mm · 2 of 25 slices shown (1 of 2)]
[im 1/25]
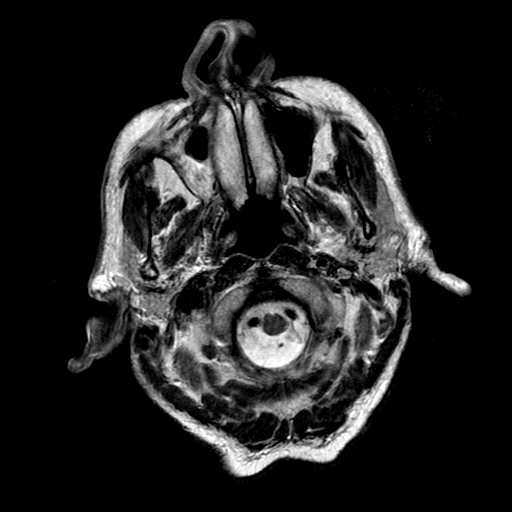
[im 25/25]
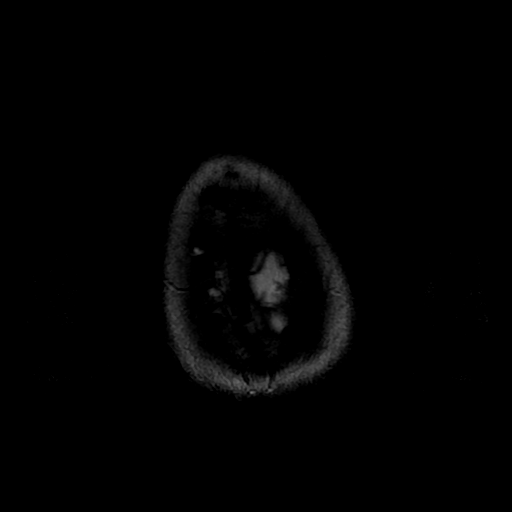

[Series 6: DWI · coronal · 5.0mm · 1.09mm/px · 7 of 72 slices shown (2 of 4)]
[im 1/72]
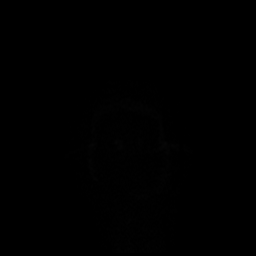
[im 12/72]
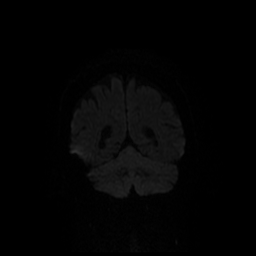
[im 24/72]
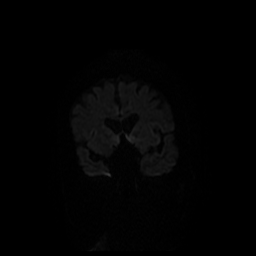
[im 36/72]
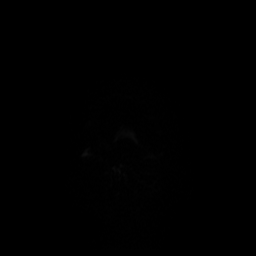
[im 48/72]
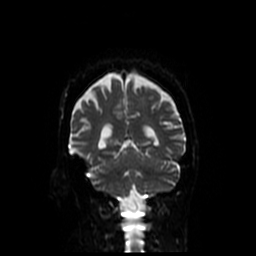
[im 60/72]
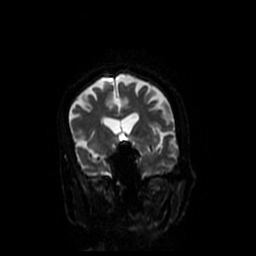
[im 72/72]
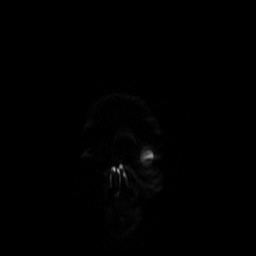

[Series 7: FLAIR · axial · 5.0mm · 0.45mm/px · z∈[-66,+78]mm · 2 of 25 slices shown]
[im 1/25]
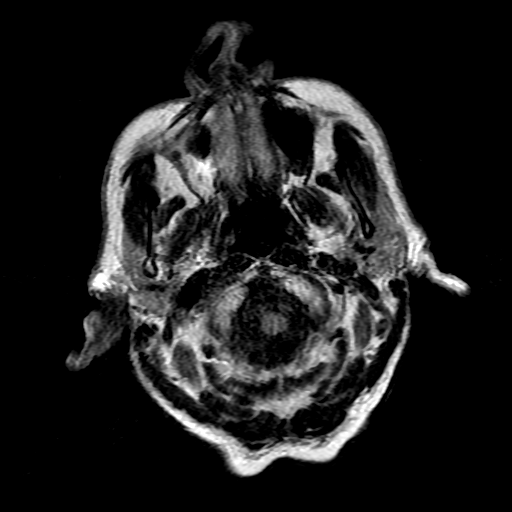
[im 25/25]
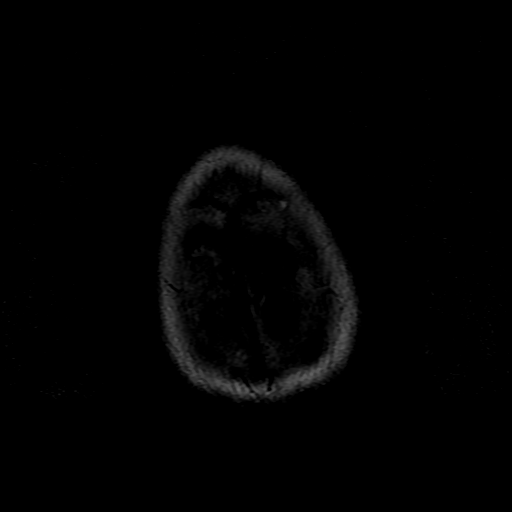

[Series 8: ax mpgr · axial · 5.0mm · 0.45mm/px · z∈[-66,+78]mm · 2 of 25 slices shown]
[im 1/25]
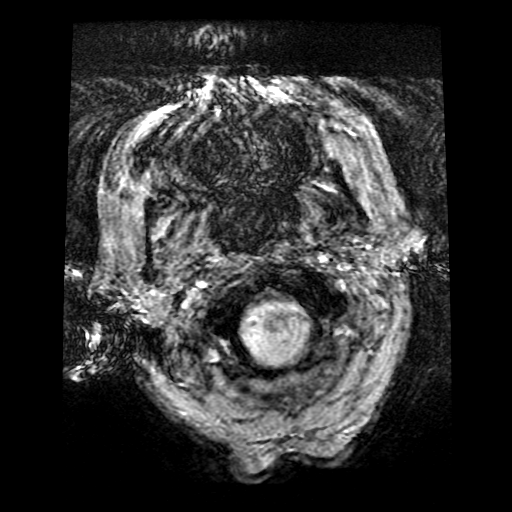
[im 25/25]
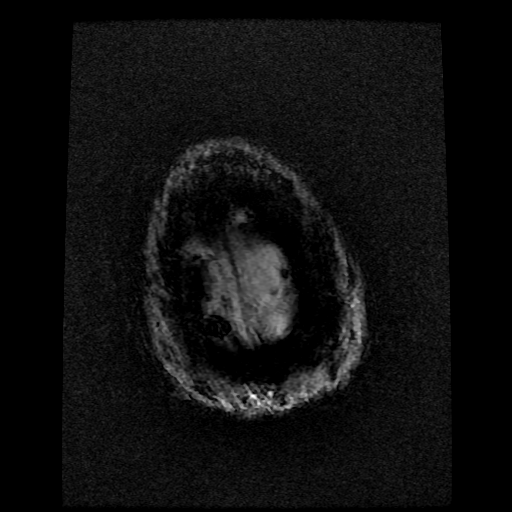

[Series 10: T2 · coronal · 5.0mm · 0.43mm/px · 3 of 30 slices shown (2 of 2)]
[im 1/30]
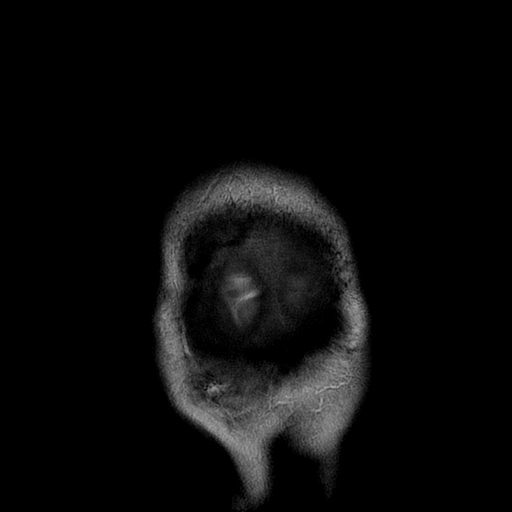
[im 15/30]
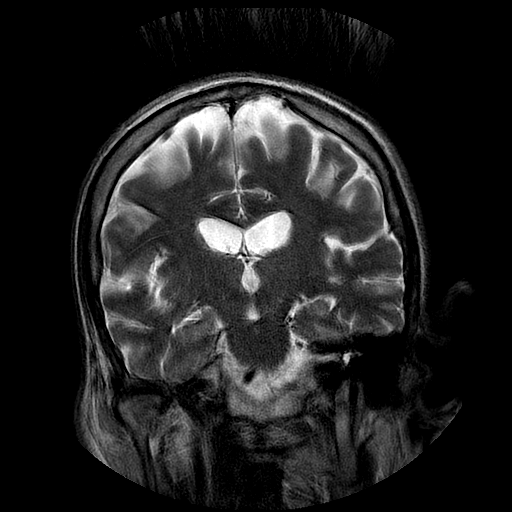
[im 30/30]
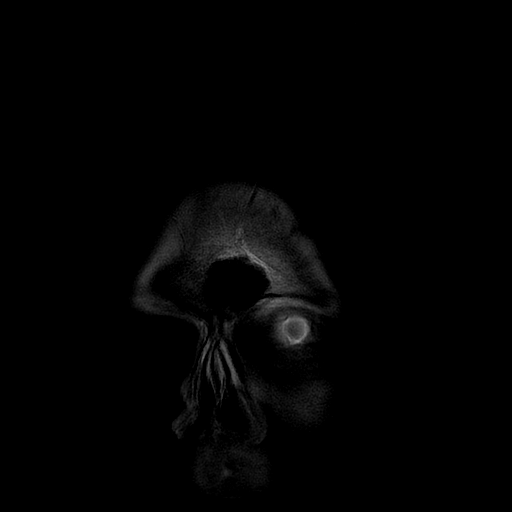

[Series 300: DWI · axial · 3.0mm · 1.09mm/px · z∈[-69,+78]mm · 5 of 50 slices shown (3 of 4)]
[im 1/50]
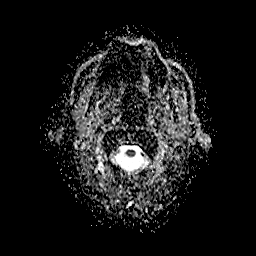
[im 13/50]
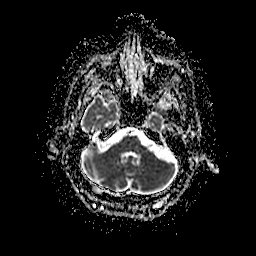
[im 25/50]
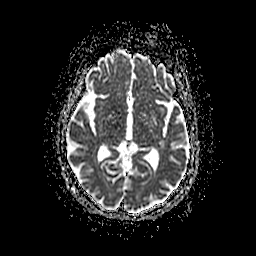
[im 37/50]
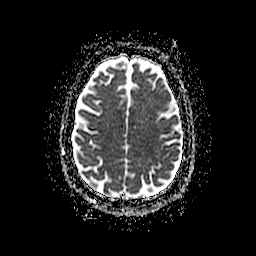
[im 50/50]
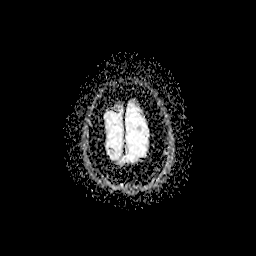

[Series 600: DWI · coronal · 5.0mm · 1.09mm/px · 4 of 36 slices shown (4 of 4)]
[im 1/36]
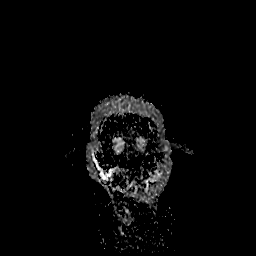
[im 12/36]
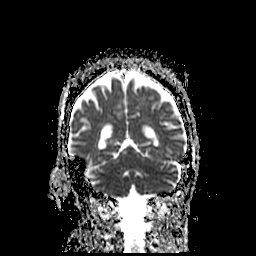
[im 24/36]
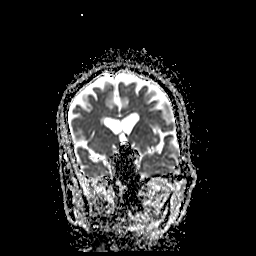
[im 36/36]
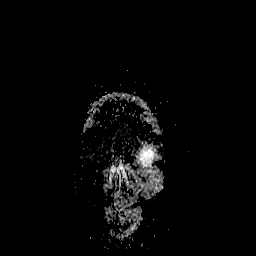

[38 of 48 positions shown; findings below may reference images not displayed]

FINDINGS: Brain: No acute infarction, hemorrhage, hydrocephalus, extra-axial
collection or mass lesion. Mild for age chronic microvascular
ischemic change in the deep cerebral white matter. Age normal brain
volume. There is a large arachnoid granulation along the right
transverse sinus, with herniation of normal signal cerebellum. There
is no historical indication of elevated intracranial pressure.

Vascular: Major flow voids are preserved

Skull and upper cervical spine: Negative for marrow lesion

Sinuses/Orbits: Sequela of chronic right maxillary sinusitis with
atelectasis. No acute finding.
IMPRESSION: 1. No acute finding.
2. Age congruent senescent changes.
3. Large right transverse arachnoid granulation containing herniated
cerebellum, of doubtful clinical significance in this case.

## 2018-07-04 DIAGNOSIS — S32009K Unspecified fracture of unspecified lumbar vertebra, subsequent encounter for fracture with nonunion: Secondary | ICD-10-CM | POA: Diagnosis not present

## 2018-07-04 DIAGNOSIS — I10 Essential (primary) hypertension: Secondary | ICD-10-CM | POA: Diagnosis not present

## 2018-07-05 ENCOUNTER — Other Ambulatory Visit: Payer: Medicare Other

## 2018-07-05 DIAGNOSIS — Z96651 Presence of right artificial knee joint: Secondary | ICD-10-CM | POA: Diagnosis not present

## 2018-07-08 ENCOUNTER — Other Ambulatory Visit (HOSPITAL_COMMUNITY): Payer: Self-pay | Admitting: Neurosurgery

## 2018-07-08 ENCOUNTER — Other Ambulatory Visit: Payer: Self-pay | Admitting: Neurosurgery

## 2018-07-08 DIAGNOSIS — S32009K Unspecified fracture of unspecified lumbar vertebra, subsequent encounter for fracture with nonunion: Secondary | ICD-10-CM

## 2018-07-08 DIAGNOSIS — R102 Pelvic and perineal pain: Secondary | ICD-10-CM

## 2018-07-11 ENCOUNTER — Encounter (HOSPITAL_COMMUNITY): Payer: Self-pay | Admitting: *Deleted

## 2018-07-11 ENCOUNTER — Ambulatory Visit (HOSPITAL_COMMUNITY): Payer: Medicare Other

## 2018-07-11 ENCOUNTER — Other Ambulatory Visit: Payer: Self-pay

## 2018-07-11 ENCOUNTER — Ambulatory Visit (HOSPITAL_COMMUNITY)
Admission: RE | Admit: 2018-07-11 | Discharge: 2018-07-11 | Disposition: A | Discharge status: Home / Self Care | Payer: Medicare Other | Source: Ambulatory Visit | Attending: Neurosurgery | Admitting: Neurosurgery

## 2018-07-11 DIAGNOSIS — I700 Atherosclerosis of aorta: Secondary | ICD-10-CM | POA: Insufficient documentation

## 2018-07-11 DIAGNOSIS — Z981 Arthrodesis status: Secondary | ICD-10-CM | POA: Diagnosis not present

## 2018-07-11 DIAGNOSIS — M4327 Fusion of spine, lumbosacral region: Secondary | ICD-10-CM | POA: Diagnosis not present

## 2018-07-11 DIAGNOSIS — N400 Enlarged prostate without lower urinary tract symptoms: Secondary | ICD-10-CM | POA: Diagnosis not present

## 2018-07-11 DIAGNOSIS — S32009K Unspecified fracture of unspecified lumbar vertebra, subsequent encounter for fracture with nonunion: Secondary | ICD-10-CM

## 2018-07-11 DIAGNOSIS — R102 Pelvic and perineal pain: Secondary | ICD-10-CM

## 2018-07-15 ENCOUNTER — Inpatient Hospital Stay (HOSPITAL_COMMUNITY): Payer: Medicare Other | Admitting: Anesthesiology

## 2018-07-15 ENCOUNTER — Inpatient Hospital Stay (HOSPITAL_COMMUNITY): Payer: Medicare Other

## 2018-07-15 ENCOUNTER — Inpatient Hospital Stay (HOSPITAL_COMMUNITY)
Admission: RE | Disposition: A | Discharge status: Home / Self Care | Payer: Self-pay | Source: Ambulatory Visit | Attending: Neurosurgery

## 2018-07-15 ENCOUNTER — Inpatient Hospital Stay (HOSPITAL_COMMUNITY)
Admission: RE | Admit: 2018-07-15 | Discharge: 2018-07-16 | DRG: 460 | Disposition: A | Discharge status: Home / Self Care | Payer: Medicare Other | Source: Ambulatory Visit | Attending: Neurosurgery | Admitting: Neurosurgery

## 2018-07-15 ENCOUNTER — Other Ambulatory Visit: Payer: Self-pay

## 2018-07-15 ENCOUNTER — Encounter (HOSPITAL_COMMUNITY): Payer: Self-pay | Admitting: General Practice

## 2018-07-15 DIAGNOSIS — Z87891 Personal history of nicotine dependence: Secondary | ICD-10-CM | POA: Diagnosis not present

## 2018-07-15 DIAGNOSIS — Z791 Long term (current) use of non-steroidal anti-inflammatories (NSAID): Secondary | ICD-10-CM | POA: Diagnosis not present

## 2018-07-15 DIAGNOSIS — Y792 Prosthetic and other implants, materials and accessory orthopedic devices associated with adverse incidents: Secondary | ICD-10-CM | POA: Diagnosis present

## 2018-07-15 DIAGNOSIS — Z79899 Other long term (current) drug therapy: Secondary | ICD-10-CM

## 2018-07-15 DIAGNOSIS — Z981 Arthrodesis status: Secondary | ICD-10-CM | POA: Diagnosis not present

## 2018-07-15 DIAGNOSIS — N400 Enlarged prostate without lower urinary tract symptoms: Secondary | ICD-10-CM | POA: Diagnosis present

## 2018-07-15 DIAGNOSIS — M960 Pseudarthrosis after fusion or arthrodesis: Secondary | ICD-10-CM | POA: Diagnosis present

## 2018-07-15 DIAGNOSIS — Z87820 Personal history of traumatic brain injury: Secondary | ICD-10-CM

## 2018-07-15 DIAGNOSIS — S32009K Unspecified fracture of unspecified lumbar vertebra, subsequent encounter for fracture with nonunion: Secondary | ICD-10-CM | POA: Diagnosis present

## 2018-07-15 DIAGNOSIS — I10 Essential (primary) hypertension: Secondary | ICD-10-CM | POA: Diagnosis present

## 2018-07-15 DIAGNOSIS — R402360 Coma scale, best motor response, obeys commands, unspecified time: Secondary | ICD-10-CM | POA: Diagnosis present

## 2018-07-15 DIAGNOSIS — Z96653 Presence of artificial knee joint, bilateral: Secondary | ICD-10-CM | POA: Diagnosis present

## 2018-07-15 DIAGNOSIS — R402140 Coma scale, eyes open, spontaneous, unspecified time: Secondary | ICD-10-CM | POA: Diagnosis present

## 2018-07-15 DIAGNOSIS — E039 Hypothyroidism, unspecified: Secondary | ICD-10-CM | POA: Diagnosis present

## 2018-07-15 DIAGNOSIS — M48061 Spinal stenosis, lumbar region without neurogenic claudication: Secondary | ICD-10-CM | POA: Diagnosis not present

## 2018-07-15 DIAGNOSIS — E119 Type 2 diabetes mellitus without complications: Secondary | ICD-10-CM | POA: Diagnosis present

## 2018-07-15 DIAGNOSIS — R402250 Coma scale, best verbal response, oriented, unspecified time: Secondary | ICD-10-CM | POA: Diagnosis present

## 2018-07-15 DIAGNOSIS — Z419 Encounter for procedure for purposes other than remedying health state, unspecified: Secondary | ICD-10-CM

## 2018-07-15 DIAGNOSIS — M5127 Other intervertebral disc displacement, lumbosacral region: Secondary | ICD-10-CM | POA: Diagnosis present

## 2018-07-15 DIAGNOSIS — Z8619 Personal history of other infectious and parasitic diseases: Secondary | ICD-10-CM

## 2018-07-15 DIAGNOSIS — Z7982 Long term (current) use of aspirin: Secondary | ICD-10-CM | POA: Diagnosis not present

## 2018-07-15 DIAGNOSIS — M5126 Other intervertebral disc displacement, lumbar region: Secondary | ICD-10-CM | POA: Diagnosis not present

## 2018-07-15 DIAGNOSIS — G709 Myoneural disorder, unspecified: Secondary | ICD-10-CM | POA: Diagnosis present

## 2018-07-15 DIAGNOSIS — Z880 Allergy status to penicillin: Secondary | ICD-10-CM

## 2018-07-15 DIAGNOSIS — Z794 Long term (current) use of insulin: Secondary | ICD-10-CM | POA: Diagnosis not present

## 2018-07-15 DIAGNOSIS — T84216A Breakdown (mechanical) of internal fixation device of vertebrae, initial encounter: Secondary | ICD-10-CM | POA: Diagnosis not present

## 2018-07-15 DIAGNOSIS — M1990 Unspecified osteoarthritis, unspecified site: Secondary | ICD-10-CM | POA: Diagnosis present

## 2018-07-15 DIAGNOSIS — M4327 Fusion of spine, lumbosacral region: Secondary | ICD-10-CM | POA: Diagnosis not present

## 2018-07-15 DIAGNOSIS — T85628A Displacement of other specified internal prosthetic devices, implants and grafts, initial encounter: Secondary | ICD-10-CM | POA: Diagnosis present

## 2018-07-15 HISTORY — DX: Unspecified injury of head, initial encounter: S09.90XA

## 2018-07-15 HISTORY — DX: Adverse effect of other opioids, initial encounter: K59.03

## 2018-07-15 HISTORY — DX: Gastro-esophageal reflux disease without esophagitis: K21.9

## 2018-07-15 HISTORY — PX: LAMINECTOMY WITH POSTERIOR LATERAL ARTHRODESIS LEVEL 2: SHX6336

## 2018-07-15 HISTORY — DX: Adverse effect of other opioids, initial encounter: T40.2X5A

## 2018-07-15 HISTORY — PX: APPLICATION OF ROBOTIC ASSISTANCE FOR SPINAL PROCEDURE: SHX6753

## 2018-07-15 LAB — BASIC METABOLIC PANEL
ANION GAP: 5 (ref 5–15)
BUN: 16 mg/dL (ref 8–23)
CHLORIDE: 101 mmol/L (ref 98–111)
CO2: 31 mmol/L (ref 22–32)
Calcium: 9.4 mg/dL (ref 8.9–10.3)
Creatinine, Ser: 0.83 mg/dL (ref 0.61–1.24)
GFR calc non Af Amer: >60 mL/min (ref >60)
Glucose, Bld: 99 mg/dL (ref 70–99)
POTASSIUM: 4.4 mmol/L (ref 3.5–5.1)
Sodium: 137 mmol/L (ref 135–145)

## 2018-07-15 LAB — GLUCOSE, CAPILLARY
GLUCOSE-CAPILLARY: 104 mg/dL — AB (ref 70–99)
GLUCOSE-CAPILLARY: 307 mg/dL — AB (ref 70–99)
Glucose-Capillary: 119 mg/dL — ABNORMAL HIGH (ref 70–99)
Glucose-Capillary: 381 mg/dL — ABNORMAL HIGH (ref 70–99)

## 2018-07-15 LAB — CBC WITH DIFFERENTIAL/PLATELET
ABS IMMATURE GRANULOCYTES: 0 10*3/uL (ref 0.0–0.1)
BASOS PCT: 1 %
Basophils Absolute: 0 10*3/uL (ref 0.0–0.1)
Eosinophils Absolute: 0.3 10*3/uL (ref 0.0–0.7)
Eosinophils Relative: 5 %
HCT: 37.3 % — ABNORMAL LOW (ref 39.0–52.0)
Hemoglobin: 12.3 g/dL — ABNORMAL LOW (ref 13.0–17.0)
IMMATURE GRANULOCYTES: 0 %
LYMPHS PCT: 21 %
Lymphs Abs: 1.2 10*3/uL (ref 0.7–4.0)
MCH: 31 pg (ref 26.0–34.0)
MCHC: 33 g/dL (ref 30.0–36.0)
MCV: 94 fL (ref 78.0–100.0)
MONOS PCT: 13 %
Monocytes Absolute: 0.7 10*3/uL (ref 0.1–1.0)
NEUTROS ABS: 3.3 10*3/uL (ref 1.7–7.7)
NEUTROS PCT: 60 %
PLATELETS: 189 10*3/uL (ref 150–400)
RBC: 3.97 MIL/uL — ABNORMAL LOW (ref 4.22–5.81)
RDW: 12.9 % (ref 11.5–15.5)
WBC: 5.5 10*3/uL (ref 4.0–10.5)

## 2018-07-15 LAB — TYPE AND SCREEN
ABO/RH(D): B POS
Antibody Screen: NEGATIVE

## 2018-07-15 LAB — HEMOGLOBIN A1C
HEMOGLOBIN A1C: 8.3 % — AB (ref 4.8–5.6)
MEAN PLASMA GLUCOSE: 191.51 mg/dL

## 2018-07-15 SURGERY — LAMINECTOMY WITH POSTERIOR LATERAL ARTHRODESIS LEVEL 2
Anesthesia: General | Site: Back

## 2018-07-15 MED ORDER — LIDOCAINE 2% (20 MG/ML) 5 ML SYRINGE
INTRAMUSCULAR | Status: DC | PRN
  Administered 2018-07-15: 60 mg via INTRAVENOUS

## 2018-07-15 MED ORDER — BUPIVACAINE HCL (PF) 0.25 % IJ SOLN
INTRAMUSCULAR | Status: DC | PRN
  Administered 2018-07-15: 20 mL

## 2018-07-15 MED ORDER — FENTANYL CITRATE (PF) 100 MCG/2ML IJ SOLN
INTRAMUSCULAR | Status: DC | PRN
  Administered 2018-07-15 (×3): 50 ug via INTRAVENOUS
  Administered 2018-07-15: 100 ug via INTRAVENOUS

## 2018-07-15 MED ORDER — ONDANSETRON HCL 4 MG/2ML IJ SOLN
INTRAMUSCULAR | Status: AC
  Filled 2018-07-15: qty 2

## 2018-07-15 MED ORDER — BUPIVACAINE HCL (PF) 0.25 % IJ SOLN
INTRAMUSCULAR | Status: AC
  Filled 2018-07-15: qty 30

## 2018-07-15 MED ORDER — ACETAMINOPHEN 325 MG PO TABS: 650.0000 mg | ORAL_TABLET | ORAL | Status: DC | PRN

## 2018-07-15 MED ORDER — AMITRIPTYLINE HCL 10 MG PO TABS
10.0000 mg | ORAL_TABLET | Freq: Every evening | ORAL | Status: DC | PRN
  Filled 2018-07-15: qty 2

## 2018-07-15 MED ORDER — HYDROCODONE-ACETAMINOPHEN 10-325 MG PO TABS: 1.0000 | ORAL_TABLET | ORAL | Status: DC | PRN

## 2018-07-15 MED ORDER — CHLORHEXIDINE GLUCONATE CLOTH 2 % EX PADS: 6.0000 | MEDICATED_PAD | Freq: Once | CUTANEOUS | Status: DC

## 2018-07-15 MED ORDER — POLYETHYLENE GLYCOL 3350 17 G PO PACK: 17.0000 g | PACK | Freq: Every day | ORAL | Status: DC | PRN

## 2018-07-15 MED ORDER — ROCURONIUM BROMIDE 50 MG/5ML IV SOSY
PREFILLED_SYRINGE | INTRAVENOUS | Status: DC | PRN
  Administered 2018-07-15 (×2): 50 mg via INTRAVENOUS

## 2018-07-15 MED ORDER — INSULIN GLARGINE 100 UNIT/ML ~~LOC~~ SOLN
25.0000 [IU] | Freq: Every day | SUBCUTANEOUS | Status: DC
  Administered 2018-07-16: 25 [IU] via SUBCUTANEOUS
  Filled 2018-07-15 (×2): qty 0.25

## 2018-07-15 MED ORDER — 0.9 % SODIUM CHLORIDE (POUR BTL) OPTIME
TOPICAL | Status: DC | PRN
  Administered 2018-07-15: 1000 mL

## 2018-07-15 MED ORDER — INSULIN ASPART 100 UNIT/ML FLEXPEN: 10.0000 [IU] | PEN_INJECTOR | Freq: Three times a day (TID) | SUBCUTANEOUS | Status: DC

## 2018-07-15 MED ORDER — PHENYLEPHRINE 40 MCG/ML (10ML) SYRINGE FOR IV PUSH (FOR BLOOD PRESSURE SUPPORT)
PREFILLED_SYRINGE | INTRAVENOUS | Status: DC | PRN
  Administered 2018-07-15: 120 ug via INTRAVENOUS

## 2018-07-15 MED ORDER — SUGAMMADEX SODIUM 500 MG/5ML IV SOLN
INTRAVENOUS | Status: AC
  Filled 2018-07-15: qty 5

## 2018-07-15 MED ORDER — INSULIN ASPART 100 UNIT/ML ~~LOC~~ SOLN
0.0000 [IU] | Freq: Three times a day (TID) | SUBCUTANEOUS | Status: DC
  Administered 2018-07-15: 11 [IU] via SUBCUTANEOUS
  Administered 2018-07-16: 8 [IU] via SUBCUTANEOUS
  Administered 2018-07-16: 11 [IU] via SUBCUTANEOUS

## 2018-07-15 MED ORDER — ONDANSETRON HCL 4 MG/2ML IJ SOLN: 4.0000 mg | Freq: Once | INTRAMUSCULAR | Status: DC | PRN

## 2018-07-15 MED ORDER — MENTHOL 3 MG MT LOZG
1.0000 | LOZENGE | OROMUCOSAL | Status: DC | PRN
  Filled 2018-07-15: qty 9

## 2018-07-15 MED ORDER — DEXAMETHASONE SODIUM PHOSPHATE 10 MG/ML IJ SOLN
10.0000 mg | INTRAMUSCULAR | Status: AC
  Administered 2018-07-15: 10 mg via INTRAVENOUS
  Filled 2018-07-15: qty 1

## 2018-07-15 MED ORDER — ONDANSETRON HCL 4 MG PO TABS: 4.0000 mg | ORAL_TABLET | Freq: Four times a day (QID) | ORAL | Status: DC | PRN

## 2018-07-15 MED ORDER — PROPOFOL 10 MG/ML IV BOLUS
INTRAVENOUS | Status: DC | PRN
  Administered 2018-07-15: 140 mg via INTRAVENOUS

## 2018-07-15 MED ORDER — THROMBIN (RECOMBINANT) 20000 UNITS EX SOLR
CUTANEOUS | Status: AC
  Filled 2018-07-15: qty 20000

## 2018-07-15 MED ORDER — ROCURONIUM BROMIDE 50 MG/5ML IV SOSY
PREFILLED_SYRINGE | INTRAVENOUS | Status: AC
  Filled 2018-07-15: qty 5

## 2018-07-15 MED ORDER — DIAZEPAM 5 MG PO TABS: 5.0000 mg | ORAL_TABLET | Freq: Four times a day (QID) | ORAL | Status: DC | PRN

## 2018-07-15 MED ORDER — FENTANYL CITRATE (PF) 100 MCG/2ML IJ SOLN: 25.0000 ug | INTRAMUSCULAR | Status: DC | PRN

## 2018-07-15 MED ORDER — CYANOCOBALAMIN 500 MCG PO TABS
2500.0000 ug | ORAL_TABLET | Freq: Every day | ORAL | Status: DC
  Administered 2018-07-15 – 2018-07-16 (×2): 2500 ug via ORAL
  Filled 2018-07-15 (×2): qty 5

## 2018-07-15 MED ORDER — HYDROCHLOROTHIAZIDE 25 MG PO TABS
25.0000 mg | ORAL_TABLET | Freq: Every evening | ORAL | Status: DC
  Administered 2018-07-15: 25 mg via ORAL
  Filled 2018-07-15: qty 1

## 2018-07-15 MED ORDER — THROMBIN 20000 UNITS EX SOLR
CUTANEOUS | Status: DC | PRN
  Administered 2018-07-15: 07:00:00 via TOPICAL

## 2018-07-15 MED ORDER — ALFUZOSIN HCL ER 10 MG PO TB24
10.0000 mg | ORAL_TABLET | Freq: Every day | ORAL | Status: DC
  Administered 2018-07-15 – 2018-07-16 (×2): 10 mg via ORAL
  Filled 2018-07-15 (×2): qty 1

## 2018-07-15 MED ORDER — VANCOMYCIN HCL IN DEXTROSE 1-5 GM/200ML-% IV SOLN
1000.0000 mg | INTRAVENOUS | Status: AC
  Administered 2018-07-15: 1000 mg via INTRAVENOUS
  Filled 2018-07-15: qty 200

## 2018-07-15 MED ORDER — ADULT MULTIVITAMIN LIQUID CH
Freq: Every day | ORAL | Status: DC
  Administered 2018-07-15: 15 mL via ORAL
  Filled 2018-07-15 (×2): qty 15

## 2018-07-15 MED ORDER — VANCOMYCIN HCL IN DEXTROSE 1-5 GM/200ML-% IV SOLN
1000.0000 mg | Freq: Once | INTRAVENOUS | Status: AC
  Administered 2018-07-15: 1000 mg via INTRAVENOUS
  Filled 2018-07-15: qty 200

## 2018-07-15 MED ORDER — SIMVASTATIN 20 MG PO TABS
20.0000 mg | ORAL_TABLET | Freq: Every evening | ORAL | Status: DC
  Administered 2018-07-15: 20 mg via ORAL
  Filled 2018-07-15: qty 1

## 2018-07-15 MED ORDER — ONDANSETRON HCL 4 MG/2ML IJ SOLN
INTRAMUSCULAR | Status: DC | PRN
  Administered 2018-07-15: 4 mg via INTRAVENOUS

## 2018-07-15 MED ORDER — SODIUM CHLORIDE 0.9 % IV SOLN
INTRAVENOUS | Status: DC | PRN
  Administered 2018-07-15: 07:00:00

## 2018-07-15 MED ORDER — PROPOFOL 10 MG/ML IV BOLUS
INTRAVENOUS | Status: AC
  Filled 2018-07-15: qty 20

## 2018-07-15 MED ORDER — SODIUM CHLORIDE 0.9 % IV SOLN
INTRAVENOUS | Status: DC | PRN
  Administered 2018-07-15: 25 ug/min via INTRAVENOUS

## 2018-07-15 MED ORDER — METFORMIN HCL 500 MG PO TABS
1000.0000 mg | ORAL_TABLET | Freq: Two times a day (BID) | ORAL | Status: DC
  Administered 2018-07-15 – 2018-07-16 (×2): 1000 mg via ORAL
  Filled 2018-07-15 (×2): qty 2

## 2018-07-15 MED ORDER — PHENYLEPHRINE 40 MCG/ML (10ML) SYRINGE FOR IV PUSH (FOR BLOOD PRESSURE SUPPORT)
PREFILLED_SYRINGE | INTRAVENOUS | Status: AC
  Filled 2018-07-15: qty 10

## 2018-07-15 MED ORDER — VANCOMYCIN HCL 1000 MG IV SOLR
INTRAVENOUS | Status: DC | PRN
  Administered 2018-07-15: 1000 mg

## 2018-07-15 MED ORDER — PHENOL 1.4 % MT LIQD: 1.0000 | OROMUCOSAL | Status: DC | PRN

## 2018-07-15 MED ORDER — ONDANSETRON HCL 4 MG/2ML IJ SOLN: 4.0000 mg | Freq: Four times a day (QID) | INTRAMUSCULAR | Status: DC | PRN

## 2018-07-15 MED ORDER — SODIUM CHLORIDE 0.9% FLUSH
3.0000 mL | Freq: Two times a day (BID) | INTRAVENOUS | Status: DC
  Administered 2018-07-15: 3 mL via INTRAVENOUS

## 2018-07-15 MED ORDER — DOCUSATE SODIUM 100 MG PO CAPS
100.0000 mg | ORAL_CAPSULE | Freq: Two times a day (BID) | ORAL | Status: DC
  Administered 2018-07-15 – 2018-07-16 (×3): 100 mg via ORAL
  Filled 2018-07-15 (×3): qty 1

## 2018-07-15 MED ORDER — FINASTERIDE 5 MG PO TABS
5.0000 mg | ORAL_TABLET | Freq: Every evening | ORAL | Status: DC
  Administered 2018-07-15: 5 mg via ORAL
  Filled 2018-07-15: qty 1

## 2018-07-15 MED ORDER — POTASSIUM 99 MG PO TABS: 198.0000 mg | ORAL_TABLET | Freq: Two times a day (BID) | ORAL | Status: DC

## 2018-07-15 MED ORDER — LACTATED RINGERS IV SOLN
INTRAVENOUS | Status: DC
  Administered 2018-07-15: 07:00:00 via INTRAVENOUS

## 2018-07-15 MED ORDER — ACETAMINOPHEN 650 MG RE SUPP: 650.0000 mg | RECTAL | Status: DC | PRN

## 2018-07-15 MED ORDER — LIDOCAINE 2% (20 MG/ML) 5 ML SYRINGE
INTRAMUSCULAR | Status: AC
  Filled 2018-07-15: qty 5

## 2018-07-15 MED ORDER — HYDROCODONE-ACETAMINOPHEN 10-325 MG PO TABS
1.0000 | ORAL_TABLET | ORAL | Status: DC | PRN
  Administered 2018-07-15 – 2018-07-16 (×5): 1 via ORAL
  Filled 2018-07-15 (×5): qty 1

## 2018-07-15 MED ORDER — HYDROMORPHONE HCL 1 MG/ML IJ SOLN: 1.0000 mg | INTRAMUSCULAR | Status: DC | PRN

## 2018-07-15 MED ORDER — SODIUM CHLORIDE 0.9% FLUSH: 3.0000 mL | INTRAVENOUS | Status: DC | PRN

## 2018-07-15 MED ORDER — BISACODYL 10 MG RE SUPP: 10.0000 mg | Freq: Every day | RECTAL | Status: DC | PRN

## 2018-07-15 MED ORDER — VITAMIN D3 25 MCG (1000 UNIT) PO TABS
1000.0000 [IU] | ORAL_TABLET | Freq: Every day | ORAL | Status: DC
  Administered 2018-07-15 – 2018-07-16 (×2): 1000 [IU] via ORAL
  Filled 2018-07-15 (×3): qty 1

## 2018-07-15 MED ORDER — OXYCODONE HCL 5 MG PO TABS: 5.0000 mg | ORAL_TABLET | Freq: Once | ORAL | Status: DC | PRN

## 2018-07-15 MED ORDER — FENTANYL CITRATE (PF) 250 MCG/5ML IJ SOLN
INTRAMUSCULAR | Status: AC
  Filled 2018-07-15: qty 5

## 2018-07-15 MED ORDER — VANCOMYCIN HCL 1000 MG IV SOLR
INTRAVENOUS | Status: AC
  Filled 2018-07-15: qty 1000

## 2018-07-15 MED ORDER — LISINOPRIL-HYDROCHLOROTHIAZIDE 20-25 MG PO TABS: 1.0000 | ORAL_TABLET | Freq: Every evening | ORAL | Status: DC

## 2018-07-15 MED ORDER — MAGNESIUM 250 MG PO TABS: 250.0000 mg | ORAL_TABLET | Freq: Every day | ORAL | Status: DC

## 2018-07-15 MED ORDER — INSULIN ASPART 100 UNIT/ML ~~LOC~~ SOLN
0.0000 [IU] | Freq: Every day | SUBCUTANEOUS | Status: DC
  Administered 2018-07-15: 5 [IU] via SUBCUTANEOUS

## 2018-07-15 MED ORDER — MAGNESIUM OXIDE 400 (241.3 MG) MG PO TABS
200.0000 mg | ORAL_TABLET | Freq: Every day | ORAL | Status: DC
  Administered 2018-07-15 – 2018-07-16 (×2): 200 mg via ORAL
  Filled 2018-07-15 (×2): qty 1

## 2018-07-15 MED ORDER — PREGABALIN 75 MG PO CAPS
225.0000 mg | ORAL_CAPSULE | Freq: Two times a day (BID) | ORAL | Status: DC
  Administered 2018-07-15 – 2018-07-16 (×2): 225 mg via ORAL
  Filled 2018-07-15 (×2): qty 3

## 2018-07-15 MED ORDER — CYCLOBENZAPRINE HCL 10 MG PO TABS
10.0000 mg | ORAL_TABLET | Freq: Two times a day (BID) | ORAL | Status: DC | PRN
  Administered 2018-07-15 – 2018-07-16 (×2): 10 mg via ORAL
  Filled 2018-07-15 (×2): qty 1

## 2018-07-15 MED ORDER — LEVOTHYROXINE SODIUM 88 MCG PO TABS
88.0000 ug | ORAL_TABLET | Freq: Every day | ORAL | Status: DC
  Administered 2018-07-16: 88 ug via ORAL
  Filled 2018-07-15: qty 1

## 2018-07-15 MED ORDER — LISINOPRIL 20 MG PO TABS
20.0000 mg | ORAL_TABLET | Freq: Every evening | ORAL | Status: DC
  Administered 2018-07-15: 20 mg via ORAL
  Filled 2018-07-15: qty 1

## 2018-07-15 MED ORDER — OXYCODONE HCL 5 MG PO TABS: 10.0000 mg | ORAL_TABLET | ORAL | Status: DC | PRN

## 2018-07-15 MED ORDER — OXYCODONE HCL 5 MG/5ML PO SOLN: 5.0000 mg | Freq: Once | ORAL | Status: DC | PRN

## 2018-07-15 MED ORDER — SUGAMMADEX SODIUM 200 MG/2ML IV SOLN
INTRAVENOUS | Status: DC | PRN
  Administered 2018-07-15: 350 mg via INTRAVENOUS

## 2018-07-15 MED ORDER — FLEET ENEMA 7-19 GM/118ML RE ENEM: 1.0000 | ENEMA | Freq: Once | RECTAL | Status: DC | PRN

## 2018-07-15 MED ORDER — SODIUM CHLORIDE 0.9 % IV SOLN: 250.0000 mL | INTRAVENOUS | Status: DC

## 2018-07-15 SURGICAL SUPPLY — 84 items
BAG DECANTER FOR FLEXI CONT (MISCELLANEOUS) ×4 IMPLANT
BENZOIN TINCTURE PRP APPL 2/3 (GAUZE/BANDAGES/DRESSINGS) ×4 IMPLANT
BIT DRILL LONG 3.0X30 (BIT) ×3 IMPLANT
BIT DRILL LONG 3.0X30MM (BIT) ×1
BIT DRILL LONG 3X80 (BIT) IMPLANT
BIT DRILL LONG 3X80MM (BIT)
BIT DRILL LONG 4X80 (BIT) ×3 IMPLANT
BIT DRILL LONG 4X80MM (BIT) ×1
BIT DRILL SHORT 3.0X30 (BIT) IMPLANT
BIT DRILL SHORT 3.0X30MM (BIT)
BIT DRILL SHORT 3X80 (BIT) IMPLANT
BIT DRILL SHORT 3X80MM (BIT)
BLADE CLIPPER SURG (BLADE) IMPLANT
BLADE SURG 11 STRL SS (BLADE) ×4 IMPLANT
BUR CUTTER 7.0 ROUND (BURR) ×4 IMPLANT
BUR MATCHSTICK NEURO 3.0 LAGG (BURR) ×4 IMPLANT
CANISTER SUCT 3000ML PPV (MISCELLANEOUS) ×4 IMPLANT
CARTRIDGE OIL MAESTRO DRILL (MISCELLANEOUS) ×2 IMPLANT
CLOSURE WOUND 1/2 X4 (GAUZE/BANDAGES/DRESSINGS) ×1
CONT SPEC 4OZ CLIKSEAL STRL BL (MISCELLANEOUS) ×4 IMPLANT
COVER BACK TABLE 60X90IN (DRAPES) ×4 IMPLANT
DECANTER SPIKE VIAL GLASS SM (MISCELLANEOUS) ×4 IMPLANT
DERMABOND ADVANCED (GAUZE/BANDAGES/DRESSINGS) ×4
DERMABOND ADVANCED .7 DNX12 (GAUZE/BANDAGES/DRESSINGS) ×4 IMPLANT
DIFFUSER DRILL AIR PNEUMATIC (MISCELLANEOUS) ×4 IMPLANT
DRAPE C-ARM 42X72 X-RAY (DRAPES) ×12 IMPLANT
DRAPE HALF SHEET 40X57 (DRAPES) ×4 IMPLANT
DRAPE LAPAROTOMY 100X72X124 (DRAPES) ×4 IMPLANT
DRAPE POUCH INSTRU U-SHP 10X18 (DRAPES) ×4 IMPLANT
DRAPE SHEET LG 3/4 BI-LAMINATE (DRAPES) ×4 IMPLANT
DRAPE SURG 17X23 STRL (DRAPES) ×16 IMPLANT
DRSG OPSITE POSTOP 4X8 (GAUZE/BANDAGES/DRESSINGS) ×4 IMPLANT
DURAPREP 26ML APPLICATOR (WOUND CARE) ×4 IMPLANT
ELECT BLADE 4.0 EZ CLEAN MEGAD (MISCELLANEOUS)
ELECT REM PT RETURN 9FT ADLT (ELECTROSURGICAL) ×4
ELECTRODE BLDE 4.0 EZ CLN MEGD (MISCELLANEOUS) IMPLANT
ELECTRODE REM PT RTRN 9FT ADLT (ELECTROSURGICAL) ×2 IMPLANT
EVACUATOR 1/8 PVC DRAIN (DRAIN) ×4 IMPLANT
GAUZE 4X4 16PLY RFD (DISPOSABLE) IMPLANT
GAUZE SPONGE 4X4 12PLY STRL (GAUZE/BANDAGES/DRESSINGS) ×4 IMPLANT
GLOVE BIOGEL PI IND STRL 6.5 (GLOVE) ×2 IMPLANT
GLOVE BIOGEL PI IND STRL 7.5 (GLOVE) ×4 IMPLANT
GLOVE BIOGEL PI INDICATOR 6.5 (GLOVE) ×2
GLOVE BIOGEL PI INDICATOR 7.5 (GLOVE) ×4
GLOVE ECLIPSE 9.0 STRL (GLOVE) ×8 IMPLANT
GLOVE EXAM NITRILE LRG STRL (GLOVE) IMPLANT
GLOVE EXAM NITRILE XL STR (GLOVE) IMPLANT
GLOVE EXAM NITRILE XS STR PU (GLOVE) IMPLANT
GLOVE SURG SS PI 6.0 STRL IVOR (GLOVE) ×4 IMPLANT
GLOVE SURG SS PI 7.0 STRL IVOR (GLOVE) ×12 IMPLANT
GOWN STRL REUS W/ TWL LRG LVL3 (GOWN DISPOSABLE) IMPLANT
GOWN STRL REUS W/ TWL XL LVL3 (GOWN DISPOSABLE) ×6 IMPLANT
GOWN STRL REUS W/TWL 2XL LVL3 (GOWN DISPOSABLE) IMPLANT
GOWN STRL REUS W/TWL LRG LVL3 (GOWN DISPOSABLE)
GOWN STRL REUS W/TWL XL LVL3 (GOWN DISPOSABLE) ×6
GRAFT BN 10X1XDBM MAGNIFUSE (Bone Implant) ×2 IMPLANT
GRAFT BONE MAGNIFUSE 1X10CM (Bone Implant) ×2 IMPLANT
GUIDEWIRE 18IN BLUNT CD HORIZ (WIRE) ×8 IMPLANT
KIT BASIN OR (CUSTOM PROCEDURE TRAY) ×4 IMPLANT
KIT INFUSE SMALL (Orthopedic Implant) ×4 IMPLANT
KIT SPINE MAZOR X ROBO DISP (MISCELLANEOUS) ×4 IMPLANT
KIT TURNOVER KIT B (KITS) ×4 IMPLANT
NEEDLE HYPO 22GX1.5 SAFETY (NEEDLE) ×4 IMPLANT
NS IRRIG 1000ML POUR BTL (IV SOLUTION) ×4 IMPLANT
OIL CARTRIDGE MAESTRO DRILL (MISCELLANEOUS) ×4
PACK LAMINECTOMY NEURO (CUSTOM PROCEDURE TRAY) ×4 IMPLANT
PIN HEAD 2.5X60MM (PIN) ×4 IMPLANT
RASP 3.0MM (RASP) ×4 IMPLANT
ROD SPINAL 5.5X90 COBALT (Rod) ×8 IMPLANT
SCREW BS CNCMA S 8.5X80 T/C (Screw) ×8 IMPLANT
SCREW SCHANZ SA 4.0MM (MISCELLANEOUS) ×4 IMPLANT
SCREW SET SOLERA (Screw) ×12 IMPLANT
SCREW SET SOLERA TI5.5 (Screw) ×12 IMPLANT
SPONGE SURGIFOAM ABS GEL 100 (HEMOSTASIS) ×4 IMPLANT
STRIP CLOSURE SKIN 1/2X4 (GAUZE/BANDAGES/DRESSINGS) ×3 IMPLANT
SUT VIC AB 0 CT1 18XCR BRD8 (SUTURE) ×4 IMPLANT
SUT VIC AB 0 CT1 8-18 (SUTURE) ×4
SUT VIC AB 2-0 CT1 18 (SUTURE) ×8 IMPLANT
SUT VIC AB 3-0 SH 8-18 (SUTURE) ×8 IMPLANT
TOWEL GREEN STERILE (TOWEL DISPOSABLE) ×4 IMPLANT
TOWEL GREEN STERILE FF (TOWEL DISPOSABLE) ×4 IMPLANT
TRAY FOLEY MTR SLVR 16FR STAT (SET/KITS/TRAYS/PACK) ×4 IMPLANT
TUBE MAZOR SA REDUCTION (TUBING) ×4 IMPLANT
WATER STERILE IRR 1000ML POUR (IV SOLUTION) ×4 IMPLANT

## 2018-07-15 NOTE — Anesthesia Postprocedure Evaluation (Signed)
Anesthesia Post Note  Patient: Elodia FlorenceRussell F Brim  Procedure(s) Performed: Posterior Lateral Fusion - Lumbar four-five - Lumbar five--Sacral one  revision, Sacral two alar iliac screws with MAZOR (N/A Back) APPLICATION OF ROBOTIC ASSISTANCE FOR SPINAL PROCEDURE (N/A )     Patient location during evaluation: PACU Anesthesia Type: General Level of consciousness: awake and alert Pain management: pain level controlled Vital Signs Assessment: post-procedure vital signs reviewed and stable Respiratory status: spontaneous breathing, nonlabored ventilation, respiratory function stable and patient connected to nasal cannula oxygen Cardiovascular status: blood pressure returned to baseline and stable Postop Assessment: no apparent nausea or vomiting Anesthetic complications: no    Last Vitals:  Vitals:   07/15/18 1221 07/15/18 1718  BP: 133/78 122/70  Pulse: 75 98  Resp:  18  Temp: 36.6 C 37.3 C  SpO2: 95% 96%    Last Pain:  Vitals:   07/15/18 1718  TempSrc: Oral  PainSc:                  , COKER

## 2018-07-15 NOTE — Evaluation (Signed)
Physical Therapy Evaluation Patient Details Name: David Mathews MRN: [id number] DOB: Jul 23, 1941 Today's Date: 07/15/2018   History of Present Illness  Patient is a 77 y/o male who presents s/p posterior lateral L4-5, L5-S1 revision with placement of of S2 alar iliac wing screws. PMH includes DM, HTN, multiple knee surgeries.   Clinical Impression  Patient presents with generalized weakness s/p above surgery. Tolerated transfers and gait training with Mod I-Min guard for safety. Uses SPC for ambulation to assist with balance and demonstrates wide BoS and some drifting but no overt LOB. Eager to maximize independence. Education re: back precautions, brace, mobility, positioning etc. Willf follow acutely to maximize independence and mobility prior to return home.     Follow Up Recommendations No PT follow up;Supervision - Intermittent    Equipment Recommendations  None recommended by PT    Recommendations for Other Services       Precautions / Restrictions Precautions Precautions: Back Precaution Booklet Issued: No Precaution Comments: Reviewed back precautions Required Braces or Orthoses: Spinal Brace Spinal Brace: Lumbar corset;Applied in sitting position Restrictions Weight Bearing Restrictions: No      Mobility  Bed Mobility Overal bed mobility: Needs Assistance Bed Mobility: Rolling;Sidelying to Sit Rolling: Modified independent (Device/Increase time) Sidelying to sit: Modified independent (Device/Increase time);HOB elevated       General bed mobility comments: Use of rail and good demo of log roll technique.   Transfers Overall transfer level: Needs assistance Equipment used: None Transfers: Sit to/from Stand Sit to Stand: Min guard         General transfer comment: Min guard for safety. Transferred to chair post ambulation.  Ambulation/Gait Ambulation/Gait assistance: Min guard Gait Distance (Feet): 350 Feet Assistive device: None;Straight cane Gait  Pattern/deviations: Step-through pattern;Wide base of support;Drifts right/left Gait velocity: decreased   General Gait Details: Slow, mildly unsteady gait- walked with and without SPC. Balance better with cane.   Stairs            Wheelchair Mobility    Modified Rankin (Stroke Patients Only)       Balance Overall balance assessment: Mild deficits observed, not formally tested                                           Pertinent Vitals/Pain Pain Assessment: No/denies pain    Home Living Family/patient expects to be discharged to:: Private residence Living Arrangements: Children Available Help at Discharge: Family;Available PRN/intermittently Type of Home: House Home Access: Stairs to enter Entrance Stairs-Rails: Right Entrance Stairs-Number of Steps: a few Home Layout: Two level;Able to live on main level with bedroom/bathroom Home Equipment: Dan Humphreys - 2 wheels;Walker - 4 wheels;Bedside commode;Cane - single point      Prior Function Level of Independence: Independent with assistive device(s)         Comments: Uses SPC for ambulation. Travels a lot for work.      Hand Dominance   Dominant Hand: Left    Extremity/Trunk Assessment   Upper Extremity Assessment Upper Extremity Assessment: Defer to OT evaluation    Lower Extremity Assessment Lower Extremity Assessment: Generalized weakness    Cervical / Trunk Assessment Cervical / Trunk Assessment: Other exceptions Cervical / Trunk Exceptions: s/p spine surgery  Communication   Communication: No difficulties  Cognition Arousal/Alertness: Awake/alert Behavior During Therapy: WFL for tasks assessed/performed Overall Cognitive Status: Within Functional Limits for tasks assessed  General Comments General comments (skin integrity, edema, etc.): Son present during session.    Exercises     Assessment/Plan    PT Assessment  Patient needs continued PT services  PT Problem List Decreased strength;Decreased mobility;Decreased balance;Decreased knowledge of precautions       PT Treatment Interventions Functional mobility training;Balance training;Patient/family education;Gait training;Therapeutic activities;Stair training;Therapeutic exercise    PT Goals (Current goals can be found in the Care Plan section)  Acute Rehab PT Goals Patient Stated Goal: to walk straighter, without the cane and get back to golf PT Goal Formulation: With patient Time For Goal Achievement: 07/29/18 Potential to Achieve Goals: Good    Frequency Min 5X/week   Barriers to discharge        Co-evaluation               AM-PAC PT "6 Clicks" Daily Activity  Outcome Measure Difficulty turning over in bed (including adjusting bedclothes, sheets and blankets)?: None Difficulty moving from lying on back to sitting on the side of the bed? : None Difficulty sitting down on and standing up from a chair with arms (e.g., wheelchair, bedside commode, etc,.)?: None Help needed moving to and from a bed to chair (including a wheelchair)?: A Little Help needed walking in hospital room?: A Little Help needed climbing 3-5 steps with a railing? : A Little 6 Click Score: 21    End of Session Equipment Utilized During Treatment: Back brace Activity Tolerance: Patient tolerated treatment well Patient left: in chair;with call bell/phone within reach;with family/visitor present Nurse Communication: Mobility status PT Visit Diagnosis: Unsteadiness on feet (R26.81);Muscle weakness (generalized) (M62.81);Difficulty in walking, not elsewhere classified (R26.2)    Time: [id number] PT Time Calculation (min) (ACUTE ONLY): 22 min   Charges:   PT Evaluation $PT Eval Low Complexity: 1 Low          [place]oneShauna , [place], DPT [phone]    Marcy PanningShauna A  07/15/2018, 3:42 PM

## 2018-07-15 NOTE — Brief Op Note (Signed)
07/15/2018  10:48 AM  PATIENT:  David Mathews  77 y.o. male  PRE-OPERATIVE DIAGNOSIS:  Pseudoarthrosis  POST-OPERATIVE DIAGNOSIS:  Pseudoarthrosis  PROCEDURE:  Procedure(s): Posterior Lateral Fusion - Lumbar four-five - Lumbar five--Sacral one  revision, Sacral two alar iliac screws with MAZOR (N/A) APPLICATION OF ROBOTIC ASSISTANCE FOR SPINAL PROCEDURE (N/A)  SURGEON:  Surgeon(s) and Role:    * Julio SicksPool, , MD - Primary    * Shirlean KellyNudelman, Robert, MD - Assisting  PHYSICIAN ASSISTANT:   ASSISTANTS:    ANESTHESIA:   general  EBL:  200 mL   BLOOD ADMINISTERED:none  DRAINS: none   LOCAL MEDICATIONS USED:  MARCAINE     SPECIMEN:  No Specimen  DISPOSITION OF SPECIMEN:  N/A  COUNTS:  YES  TOURNIQUET:  * No tourniquets in log *  DICTATION: .Dragon Dictation  PLAN OF CARE: Admit to inpatient   PATIENT DISPOSITION:  PACU - hemodynamically stable.   Delay start of Pharmacological VTE agent (>24hrs) due to surgical blood loss or risk of bleeding: yes

## 2018-07-15 NOTE — Anesthesia Procedure Notes (Addendum)
Procedure Name: Intubation Date/Time: 07/15/2018 8:21 AM Performed by: Kyung Rudd, CRNA Pre-anesthesia Checklist: Patient identified, Emergency Drugs available, Suction available and Patient being monitored Patient Re-evaluated:Patient Re-evaluated prior to induction Oxygen Delivery Method: Circle system utilized Preoxygenation: Pre-oxygenation with 100% oxygen Induction Type: IV induction Ventilation: Mask ventilation without difficulty and Oral airway inserted - appropriate to patient size Laryngoscope Size: Mac and 4 Grade View: Grade II Tube type: Oral Tube size: 7.5 mm Number of attempts: 1 Airway Equipment and Method: Stylet Placement Confirmation: ETT inserted through vocal cords under direct vision,  positive ETCO2 and breath sounds checked- equal and bilateral Secured at: 22 cm Dental Injury: Teeth and Oropharynx as per pre-operative assessment

## 2018-07-15 NOTE — Progress Notes (Signed)
Pharmacy consulted for vancomycin dosing for surgical prophylaxis in patient who is s/p Posterior Lateral Fusion - Lumbar four-five - Lumbar five--Sacral one  revision, Sacral two alar iliac screws. Patient with PCN allergy.   Will give Vancomycin 1gm at 1930 post op (12 hours from preop dose) x 1. No drains noted per op-note or from conversation with RN.   Thank you for the consult. Pharmacy will sign off.    A. Jeanella CrazePierce, PharmD, BCPS Clinical Pharmacist [facility] Pager: [phone] Please utilize Amion for appropriate phone number to reach the unit pharmacist [facility])

## 2018-07-15 NOTE — Anesthesia Preprocedure Evaluation (Addendum)
Anesthesia Evaluation  Patient identified by MRN, date of birth, ID band Patient awake    Reviewed: Allergy & Precautions, NPO status , Patient's Chart, lab work & pertinent test results  Airway Mallampati: II  TM Distance: >3 FB Neck ROM: Full    Dental  (+) Edentulous Upper   Pulmonary former smoker,    breath sounds clear to auscultation       Cardiovascular hypertension,  Rhythm:Regular Rate:Normal     Neuro/Psych    GI/Hepatic   Endo/Other  diabetes  Renal/GU      Musculoskeletal   Abdominal   Peds  Hematology   Anesthesia Other Findings   Reproductive/Obstetrics                             Anesthesia Physical Anesthesia Plan  ASA: III  Anesthesia Plan: General   Post-op Pain Management:    Induction: Intravenous  PONV Risk Score and Plan: Ondansetron  Airway Management Planned: Oral ETT  Additional Equipment:   Intra-op Plan:   Post-operative Plan: Extubation in OR  Informed Consent: I have reviewed the patients History and Physical, chart, labs and discussed the procedure including the risks, benefits and alternatives for the proposed anesthesia with the patient or authorized representative who has indicated his/her understanding and acceptance.   Dental advisory given  Plan Discussed with: CRNA and Anesthesiologist  Anesthesia Plan Comments:         Anesthesia Quick Evaluation
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
## 2018-07-15 NOTE — Plan of Care (Signed)
  Problem: Safety: Goal: Ability to remain free from injury will improve Outcome: Progressing   

## 2018-07-15 NOTE — Transfer of Care (Signed)
Immediate Anesthesia Transfer of Care Note  Patient: David Mathews  Procedure(s) Performed: Posterior Lateral Fusion - Lumbar four-five - Lumbar five--Sacral one  revision, Sacral two alar iliac screws with MAZOR (N/A Back) APPLICATION OF ROBOTIC ASSISTANCE FOR SPINAL PROCEDURE (N/A )  Patient Location: PACU  Anesthesia Type:General  Level of Consciousness: awake, alert  and oriented  Airway & Oxygen Therapy: Patient Spontanous Breathing and Patient connected to nasal cannula oxygen  Post-op Assessment: Report given to RN, Post -op Vital signs reviewed and stable and Patient moving all extremities X 4  Post vital signs: Reviewed and stable  Last Vitals:  Vitals Value Taken Time  BP 128/77 07/15/2018 11:04 AM  Temp    Pulse 73 07/15/2018 11:09 AM  Resp 9 07/15/2018 11:09 AM  SpO2 100 % 07/15/2018 11:09 AM  Vitals shown include unvalidated device data.  Last Pain:  Vitals:   07/15/18 0708  TempSrc:   PainSc: 4       Patients Stated Pain Goal: 3 (07/15/18 0708)  Complications: No apparent anesthesia complications

## 2018-07-15 NOTE — Op Note (Signed)
Date of procedure: 07/15/2018  Date of dictation: Same  Service: Neurosurgery  Preoperative diagnosis: L5-S1 pseudoarthrosis with hardware failure  Postoperative diagnosis: Same  Procedure Name: Reexploration of L5 and S1 posterior lateral fusion with removal of instrumentation  Revision posterior lateral lumbar fusion L4-5 S1-S2 pelvis with local autograft, morselized allograft and bone morphogenic protein.  And segmental instrumentation  Placement of S2 alar iliac wing screws with robot guidance  Surgeon: A., M.D.  Asst. Surgeon: Newell CoralNudelman  Anesthesia: General  Indication: 77 year old male status post prior thoracic lumbar fusion for treatment of severe thoracic lumbar kyphosis.  Patient with subsequent large L5-S1 disc herniation with subsequent bilateral discectomies and posterior lumbar interbody fusion L5-S1.  The patient has unfortunately developed a symptomatic pseudoarthrosis with S1 pedicle screw loosening.  Lumbar fusion otherwise appears to be solid.  Patient presents now for revision lumbar sacral fusion with additional fixation into his pelvis.  Operative note: After induction of anesthesia, patient position prone [facility] the [facility] and appropriately padded.  Lumbar region nickel region prepped and draped sterilely.  Incision made from L4-S2.  Dissection performed bilaterally.  Previously placed L4-L5 and S1 pedicle screws dissected free.  The Mazor robot was then registered.  Preoperative CT scanning had been performed.  A pin was placed into the left posterior superior iliac spine.  The robot system was attached and then fluoroscopic images were used to register the patient's position into the guidance computer.  Using the Mazor for guidance entry sites were determined for S2 alar iliac wing screws.  Preoperative planning had been performed for placement of the screws.  Drill was then passed to 80 mm through the guidance port.  K wire was then placed and found to be  solidly within bone.  The drill track was then tapped with a 7.5 millimeter screw tapped and then an 8.5 x 80 mm Medtronic screw was then placed into the S2 pedicle alar wing and ileum.  Good purchase of bone was assured.  The procedure was repeated on the contralateral side.  The guidance computer was then disassembled and removed.  The pedicle screws mentation at L5-S1 was disassembled and the S1 screws were removed as they were loosened.  The L5 screws were intact.  The L4 screws were going to be used in the construct.  The rod was cut proximal to the L4 screws.  The screw caps were removed and the segment of cut rod was removed.  Wound was then irrigated with fanlike solution.  Short segment of titanium rods and placed over the screw heads at L4-L5 and into the screw heads at S2.  Locking caps were then engaged under the screw heads.  Screws given final tightening.  Final images in the AP plane reveal good position of the iliac wing screws and good appearance of the construct from L4-S2.  Previous fusion mass posterior laterally at L4-5 was decorticated.  The sacral ala was decorticated once again.  Bone morphogenic protein soaked sponges were packed posterior laterally as well as locally harvested autograft and 10 cc magna fuse packets.  Vancomycin powder was placed in deep wound space.  Wounds and closed in layers of Vicryl sutures.  Steri-Strips and sterile dressing were applied.  No apparent complications.  Patient tolerated procedure well and he returns to the recovery room postop.

## 2018-07-16 LAB — GLUCOSE, CAPILLARY
GLUCOSE-CAPILLARY: 276 mg/dL — AB (ref 70–99)
Glucose-Capillary: 342 mg/dL — ABNORMAL HIGH (ref 70–99)

## 2018-07-16 MED ORDER — HYDROCODONE-ACETAMINOPHEN 10-325 MG PO TABS: 1.0000 | ORAL_TABLET | ORAL | 0 refills | Status: DC | PRN

## 2018-07-16 MED FILL — Thrombin (Recombinant) For Soln 20000 Unit: CUTANEOUS | Qty: 1 | Status: AC

## 2018-07-16 NOTE — Discharge Summary (Signed)
Physician Discharge Summary  Patient ID: David Mathews MRN: [id number] DOB/AGE: [birth year] 77 y.o.  Admit date: 07/15/2018 Discharge date: 07/16/2018  Admission Diagnoses:  Discharge Diagnoses:  Active Problems:   Lumbar pseudoarthrosis   Discharged Condition: good  Hospital Course: Patient admitted to the hospital where he underwent uncomplicated lumbosacral pelvic fusion.  Postoperatively doing very well.  Preoperative back pain much improved.  Standing straighter and walking better.  Patient feels comfortable with discharge home.  Consults:   Significant Diagnostic Studies:   Treatments:   Discharge Exam: Blood pressure 123/68, pulse 68, temperature 98 F (36.7 C), temperature source Oral, resp. rate 18, height 5' 8.5" (1.74 m), weight 81.6 kg, SpO2 94 %. Awake and alert.  Oriented and appropriate.  Cranial nerve function intact.  Speech fluent.  Judgment insight intact.  Motor examination with some continued moderate dorsiflexion weakness bilaterally improved from preop.  Otherwise neurologically intact.  Sensory examination nonfocal.  Wound clean and dry.  Chest and abdomen benign.  Disposition: Discharge disposition: 01-Home or Self Care        Allergies as of 07/16/2018      Reactions   Amoxicillin Rash, Other (See Comments)   Yeast infection Has patient had a PCN reaction causing immediate rash, facial/tongue/throat swelling, SOB or lightheadedness with hypotension: No Has patient had a PCN reaction causing severe rash involving mucus membranes or skin necrosis: No Has patient had a PCN reaction that required hospitalization No Has patient had a PCN reaction occurring within the last 10 years: Yes If all of the above answers are "NO", then may proceed with Cephalosporin use.      Medication List    TAKE these medications   amitriptyline 10 MG tablet Commonly known as:  ELAVIL Take 10-20 mg by mouth at bedtime as needed for sleep.   aspirin EC 81 MG  tablet Take 81 mg by mouth daily.   Cyanocobalamin 2500 MCG Chew Chew 2,500 mcg by mouth daily.   cyclobenzaprine 10 MG tablet Commonly known as:  FLEXERIL Take 10 mg by mouth 2 (two) times daily as needed for muscle spasms.   diazepam 5 MG tablet Commonly known as:  VALIUM Take 1-2 tablets (5-10 mg total) by mouth every 6 (six) hours as needed for muscle spasms.   diclofenac 75 MG EC tablet Commonly known as:  VOLTAREN Take 75 mg by mouth 2 (two) times daily.   docusate sodium 100 MG capsule Commonly known as:  COLACE Take 1 capsule (100 mg total) by mouth 2 (two) times daily.   finasteride 5 MG tablet Commonly known as:  PROSCAR Take 5 mg by mouth every evening.   HYDROcodone-acetaminophen 10-325 MG tablet Commonly known as:  NORCO Take 1-2 tablets by mouth every 4 (four) hours as needed (breakthrough pain).   Insulin Glargine 100 UNIT/ML Solostar Pen Commonly known as:  LANTUS Inject 55 Units into the skin daily. Per sliding scale What changed:    how much to take  when to take this  additional instructions   levothyroxine 88 MCG tablet Commonly known as:  SYNTHROID, LEVOTHROID Take 88 mcg by mouth daily before breakfast.   lisinopril-hydrochlorothiazide 20-25 MG tablet Commonly known as:  PRINZIDE,ZESTORETIC Take 1 tablet by mouth every evening.   Magnesium 250 MG Tabs Take 250 mg by mouth daily.   metFORMIN 1000 MG tablet Commonly known as:  GLUCOPHAGE Take 1,000 mg by mouth 2 (two) times daily with a meal.   MULTIVITAMIN PO Take 1 tablet by mouth daily.  NOVOLOG FLEXPEN 100 UNIT/ML FlexPen Generic drug:  insulin aspart Inject 10-14 Units into the skin 3 (three) times daily with meals. PER SLIDING SCALE   polyethylene glycol packet Commonly known as:  MIRALAX / GLYCOLAX Take 17 g by mouth 2 (two) times daily. What changed:    when to take this  reasons to take this   Potassium 99 MG Tabs Take 198 mg by mouth 2 (two) times daily.    pregabalin 225 MG capsule Commonly known as:  LYRICA Take 225 mg by mouth 2 (two) times daily.   simvastatin 20 MG tablet Commonly known as:  ZOCOR Take 20 mg by mouth every evening.   UROXATRAL 10 MG 24 hr tablet Generic drug:  alfuzosin Take 10 mg by mouth daily.   Vitamin D3 1000 units Caps Take 1,000 Units by mouth daily.            Durable Medical Equipment  (From admission, onward)         Start     Ordered   07/15/18 1214  DME Walker rolling  Once    Question:  Patient needs a walker to treat with the following condition  Answer:  Lumbar pseudoarthrosis   07/15/18 1213   07/15/18 1214  DME 3 n 1  Once     07/15/18 1213           Signed: Sherilyn CooterHenry A  07/16/2018, 10:08 AM

## 2018-07-16 NOTE — Progress Notes (Signed)
Physical Therapy Treatment Patient Details Name: David Mathews MRN: [id number] DOB: [birth year] Today's Date: 07/16/2018    History of Present Illness Patient is a 77 y/o male who presents s/p posterior lateral L4-5, L5-S1 revision with placement of of S2 alar iliac wing screws. PMH includes DM, HTN, multiple knee surgeries.     PT Comments    Pt progressing towards physical therapy goals. Was able to perform transfers and ambulation with up to modified independence and [facility] for safety. Education difficult at times as pt reporting he knows everything he has to do at home, however consistently breaking back precautions. Attempted education on brace application/wearing schedule, car transfer, activity progression, and maintenance of precautions. Will continue to follow.   Follow Up Recommendations  No PT follow up;Supervision - Intermittent     Equipment Recommendations  None recommended by PT    Recommendations for Other Services       Precautions / Restrictions Precautions Precautions: Back Precaution Booklet Issued: No Precaution Comments: Reviewed back precautions during functional mobility. Pt with poor adherence to precautions.  Required Braces or Orthoses: Spinal Brace Spinal Brace: Lumbar corset;Applied in sitting position Restrictions Weight Bearing Restrictions: No    Mobility  Bed Mobility Overal bed mobility: Modified Independent Bed Mobility: Rolling;Sidelying to Sit Rolling: Modified independent (Device/Increase time) Sidelying to sit: Modified independent (Device/Increase time);HOB elevated       General bed mobility comments: Use of rail and good demo of log roll technique.   Transfers Overall transfer level: Modified independent Equipment used: None Transfers: Sit to/from Stand Sit to Stand: Modified independent (Device/Increase time)         General transfer comment: SPC for support. No unsteadiness or LOB noted.    Ambulation/Gait Ambulation/Gait assistance: Supervision Gait Distance (Feet): 400 Feet Assistive device: Straight cane Gait Pattern/deviations: Step-through pattern;Wide base of support;Drifts right/left Gait velocity: decreased Gait velocity interpretation: 1.31 - 2.62 ft/sec, indicative of limited community ambulator General Gait Details: Antalgic but overall safe with SPC. No unsteadiness or LOB noted.    Stairs Stairs: Yes Stairs assistance: Supervision Stair Management: One rail Right;With cane;Step to pattern;Alternating pattern Number of Stairs: 10 General stair comments: Step-to pattern ascending and alternating step descending.    Wheelchair Mobility    Modified Rankin (Stroke Patients Only)       Balance Overall balance assessment: Mild deficits observed, not formally tested                                          Cognition Arousal/Alertness: Awake/alert Behavior During Therapy: WFL for tasks assessed/performed Overall Cognitive Status: Within Functional Limits for tasks assessed                                        Exercises      General Comments General comments (skin integrity, edema, etc.): Back handout provided      Pertinent Vitals/Pain Pain Assessment: No/denies pain    Home Living Family/patient expects to be discharged to:: Private residence Living Arrangements: Children Available Help at Discharge: Family;Available PRN/intermittently Type of Home: House Home Access: Stairs to enter Entrance Stairs-Rails: Right Home Layout: Two level;Able to live on main level with bedroom/bathroom Home Equipment: Dan Humphreys - 2 wheels;Walker - 4 wheels;Bedside commode;Cane - single point;Adaptive equipment      Prior Function  Level of Independence: Independent with assistive device(s)      Comments: Uses SPC for ambulation. Travels a lot for work.    PT Goals (current goals can now be found in the care plan section)  Acute Rehab PT Goals Patient Stated Goal: to walk straighter, without the cane and get back to golf PT Goal Formulation: With patient Time For Goal Achievement: 07/29/18 Potential to Achieve Goals: Good Progress towards PT goals: Progressing toward goals    Frequency    Min 5X/week      PT Plan Current plan remains appropriate    Co-evaluation              AM-PAC PT "6 Clicks" Daily Activity  Outcome Measure  Difficulty turning over in bed (including adjusting bedclothes, sheets and blankets)?: None Difficulty moving from lying on back to sitting on the side of the bed? : None Difficulty sitting down on and standing up from a chair with arms (e.g., wheelchair, bedside commode, etc,.)?: None Help needed moving to and from a bed to chair (including a wheelchair)?: A Little Help needed walking in hospital room?: A Little Help needed climbing 3-5 steps with a railing? : A Little 6 Click Score: 21    End of Session Equipment Utilized During Treatment: Back brace Activity Tolerance: Patient tolerated treatment well Patient left: in chair;with call bell/phone within reach;with family/visitor present Nurse Communication: Mobility status PT Visit Diagnosis: Unsteadiness on feet (R26.81);Muscle weakness (generalized) (M62.81);Difficulty in walking, not elsewhere classified (R26.2)     Time: [id number] PT Time Calculation (min) (ACUTE ONLY): 15 min  Charges:  $Gait Training: 8-22 mins                     Conni SlipperLaura , PT, DPT Acute Rehabilitation Services Pager: [phone]    Marylynn PearsonLaura D  07/16/2018, 8:59 AM

## 2018-07-16 NOTE — Progress Notes (Signed)
Inpatient Diabetes Program Recommendations  AACE/ADA: New Consensus Statement on Inpatient Glycemic Control (2015)  Target Ranges:  Prepandial:   less than 140 mg/dL      Peak postprandial:   less than 180 mg/dL (1-2 hours)      Critically ill patients:  140 - 180 mg/dL   Lab Results  Component Value Date   GLUCAP 342 (H) 07/16/2018   HGBA1C 8.3 (H) 07/15/2018    Review of Glycemic Control  Diabetes history: Type 2 Outpatient Diabetes medications: Lantus 55 unit daily, Novolog 10-14 units sliding scale TID, Metformin Current orders for Inpatient glycemic control: Lantus 25 units daily, Novolog MODERATE TID & HS, Metormin  Inpatient Diabetes Program Recommendations:   Noted that patient's blood sugars have been greater than 300 mg/dl since last night. Noted that Lantus had not been given yesterday.  If patient is not discharged today and if patient's blood sugars continue to be greater than 200 mg/dl, may need to increase Lantus to 35 units daily since patient takes Lantus 55 units at home. Titrate dosage as needed.   Smith MinceKendra  RN BSN CDE Diabetes Coordinator Pager: [phone]  8am-5pm

## 2018-07-16 NOTE — Progress Notes (Signed)
Patient is discharged from room 3C07 at this time. Alert and in stable condition. IV site d/c'd and instructions read to patient and daughter with understanding verbalized. Left unit via wheelchair with all belongings at side.  

## 2018-07-16 NOTE — Discharge Instructions (Signed)

## 2018-07-16 NOTE — Evaluation (Signed)
Occupational Therapy Evaluation Patient Details Name: David Mathews MRN: [id number] DOB: 12-Apr-1941 Today's Date: 07/16/2018    History of Present Illness Patient is a 77 y/o male who presents s/p posterior lateral L4-5, L5-S1 revision with placement of of S2 alar iliac wing screws. PMH includes DM, HTN, multiple knee surgeries.    Clinical Impression   PTA, pt was living with son and daughter-in-law and was independent. Currently, pt requires supervision for ADLs and functional mobility using RW. Provided education and handout on brace management, LB ADLs, toilet transfer, and shower transfer; pt demonstrated and verbalized understanding. Answered all pt questions. Recommend dc home once medically stable per physician. All acute OT needs met and will sign off. Thank you.     Follow Up Recommendations  Follow surgeon's recommendation for DC plan and follow-up therapies;Supervision/Assistance - 24 hour    Equipment Recommendations  None recommended by OT    Recommendations for Other Services PT consult     Precautions / Restrictions Precautions Precautions: Back Precaution Booklet Issued: No Precaution Comments: Reviewed back precautions Required Braces or Orthoses: Spinal Brace Spinal Brace: Lumbar corset;Applied in sitting position Restrictions Weight Bearing Restrictions: No      Mobility Bed Mobility Overal bed mobility: Needs Assistance Bed Mobility: Rolling;Sidelying to Sit Rolling: Modified independent (Device/Increase time) Sidelying to sit: Modified independent (Device/Increase time);HOB elevated       General bed mobility comments: Use of rail and good demo of log roll technique.   Transfers Overall transfer level: Needs assistance Equipment used: None Transfers: Sit to/from Stand Sit to Stand: Supervision         General transfer comment: supervision for safety    Balance Overall balance assessment: Mild deficits observed, not formally tested                                         ADL either performed or assessed with clinical judgement   ADL Overall ADL's : Needs assistance/impaired                                       General ADL Comments: Pt performing ADLs and fuctional mobility at supervision level. Pt able to verablize back precautions. Educating pt on compensatory techniques for LB ADLs, brace management, bed mobility, and functional transfers     Vision Baseline Vision/History: Wears glasses Wears Glasses: Reading only Patient Visual Report: No change from baseline       Perception     Praxis      Pertinent Vitals/Pain Pain Assessment: No/denies pain     Hand Dominance Left   Extremity/Trunk Assessment Upper Extremity Assessment Upper Extremity Assessment: Overall WFL for tasks assessed   Lower Extremity Assessment Lower Extremity Assessment: Defer to PT evaluation   Cervical / Trunk Assessment Cervical / Trunk Assessment: Other exceptions Cervical / Trunk Exceptions: s/p spine surgery   Communication Communication Communication: No difficulties   Cognition Arousal/Alertness: Awake/alert Behavior During Therapy: WFL for tasks assessed/performed Overall Cognitive Status: Within Functional Limits for tasks assessed                                     General Comments  Back handout provided    Exercises     Shoulder Instructions  Home Living Family/patient expects to be discharged to:: Private residence Living Arrangements: Children Available Help at Discharge: Family;Available PRN/intermittently Type of Home: House Home Access: Stairs to enter [employer] of Steps: a few Entrance Stairs-Rails: Right Home Layout: Two level;Able to live on main level with bedroom/bathroom     Bathroom Shower/Tub: Walk-in shower   Bathroom Toilet: Handicapped height     Home Equipment: Environmental consultant - 2 wheels;Walker - 4 wheels;Bedside  commode;Cane - single point;Adaptive equipment Adaptive Equipment: Reacher        Prior Functioning/Environment Level of Independence: Independent with assistive device(s)        Comments: Uses SPC for ambulation. Travels a lot for work.         OT Problem List: Decreased activity tolerance;Decreased range of motion;Impaired balance (sitting and/or standing);Decreased knowledge of use of DME or AE;Decreased knowledge of precautions      OT Treatment/Interventions:      OT Goals(Current goals can be found in the care plan section) Acute Rehab OT Goals Patient Stated Goal: to walk straighter, without the cane and get back to golf OT Goal Formulation: All assessment and education complete, DC therapy  OT Frequency:     Barriers to D/C:            Co-evaluation              AM-PAC PT "6 Clicks" Daily Activity     Outcome Measure Help from another person eating meals?: None Help from another person taking care of personal grooming?: None Help from another person toileting, which includes using toliet, bedpan, or urinal?: None Help from another person bathing (including washing, rinsing, drying)?: None Help from another person to put on and taking off regular upper body clothing?: None Help from another person to put on and taking off regular lower body clothing?: None 6 Click Score: 24   End of Session Equipment Utilized During Treatment: Other (comment)(SPC) Nurse Communication: Mobility status;Precautions  Activity Tolerance: Patient tolerated treatment well Patient left: in bed;with call bell/phone within reach(EOB)  OT Visit Diagnosis: Unsteadiness on feet (R26.81);Other abnormalities of gait and mobility (R26.89);Muscle weakness (generalized) (M62.81)                Time: 0962-8366 OT Time Calculation (min): 14 min Charges:  OT General Charges $OT Visit: 1 Visit OT Evaluation $OT Eval Low Complexity: [place], OTR/L Acute Rehab Pager:  [phone] Office: [place] 07/16/2018, 8:33 AM

## 2018-07-18 ENCOUNTER — Encounter (HOSPITAL_COMMUNITY): Payer: Self-pay | Admitting: Neurosurgery

## 2018-08-15 DIAGNOSIS — S32009K Unspecified fracture of unspecified lumbar vertebra, subsequent encounter for fracture with nonunion: Secondary | ICD-10-CM | POA: Diagnosis not present

## 2018-09-17 DIAGNOSIS — S32009K Unspecified fracture of unspecified lumbar vertebra, subsequent encounter for fracture with nonunion: Secondary | ICD-10-CM | POA: Diagnosis not present

## 2018-10-07 ENCOUNTER — Other Ambulatory Visit: Payer: Self-pay

## 2018-10-15 DIAGNOSIS — I10 Essential (primary) hypertension: Secondary | ICD-10-CM | POA: Diagnosis not present

## 2018-10-15 DIAGNOSIS — S32009K Unspecified fracture of unspecified lumbar vertebra, subsequent encounter for fracture with nonunion: Secondary | ICD-10-CM | POA: Diagnosis not present

## 2018-10-23 DIAGNOSIS — R42 Dizziness and giddiness: Secondary | ICD-10-CM | POA: Diagnosis not present

## 2018-10-23 DIAGNOSIS — S32009K Unspecified fracture of unspecified lumbar vertebra, subsequent encounter for fracture with nonunion: Secondary | ICD-10-CM | POA: Diagnosis not present

## 2018-10-23 DIAGNOSIS — M545 Low back pain: Secondary | ICD-10-CM | POA: Diagnosis not present

## 2018-10-23 DIAGNOSIS — R269 Unspecified abnormalities of gait and mobility: Secondary | ICD-10-CM | POA: Diagnosis not present

## 2018-10-24 DIAGNOSIS — R269 Unspecified abnormalities of gait and mobility: Secondary | ICD-10-CM | POA: Diagnosis not present

## 2018-10-24 DIAGNOSIS — S32009K Unspecified fracture of unspecified lumbar vertebra, subsequent encounter for fracture with nonunion: Secondary | ICD-10-CM | POA: Diagnosis not present

## 2018-10-24 DIAGNOSIS — R42 Dizziness and giddiness: Secondary | ICD-10-CM | POA: Diagnosis not present

## 2018-10-24 DIAGNOSIS — M545 Low back pain: Secondary | ICD-10-CM | POA: Diagnosis not present

## 2018-10-28 DIAGNOSIS — R42 Dizziness and giddiness: Secondary | ICD-10-CM | POA: Diagnosis not present

## 2018-10-28 DIAGNOSIS — Z9181 History of falling: Secondary | ICD-10-CM | POA: Diagnosis not present

## 2018-10-28 DIAGNOSIS — E039 Hypothyroidism, unspecified: Secondary | ICD-10-CM | POA: Diagnosis not present

## 2018-10-28 DIAGNOSIS — E7800 Pure hypercholesterolemia, unspecified: Secondary | ICD-10-CM | POA: Diagnosis not present

## 2018-10-28 DIAGNOSIS — I10 Essential (primary) hypertension: Secondary | ICD-10-CM | POA: Diagnosis not present

## 2018-10-28 DIAGNOSIS — E1140 Type 2 diabetes mellitus with diabetic neuropathy, unspecified: Secondary | ICD-10-CM | POA: Diagnosis not present

## 2018-10-28 DIAGNOSIS — M545 Low back pain: Secondary | ICD-10-CM | POA: Diagnosis not present

## 2018-10-28 DIAGNOSIS — S32009K Unspecified fracture of unspecified lumbar vertebra, subsequent encounter for fracture with nonunion: Secondary | ICD-10-CM | POA: Diagnosis not present

## 2018-10-28 DIAGNOSIS — Z1331 Encounter for screening for depression: Secondary | ICD-10-CM | POA: Diagnosis not present

## 2018-10-28 DIAGNOSIS — Z79899 Other long term (current) drug therapy: Secondary | ICD-10-CM | POA: Diagnosis not present

## 2018-10-28 DIAGNOSIS — Z23 Encounter for immunization: Secondary | ICD-10-CM | POA: Diagnosis not present

## 2018-10-28 DIAGNOSIS — R269 Unspecified abnormalities of gait and mobility: Secondary | ICD-10-CM | POA: Diagnosis not present

## 2018-10-30 DIAGNOSIS — S32009K Unspecified fracture of unspecified lumbar vertebra, subsequent encounter for fracture with nonunion: Secondary | ICD-10-CM | POA: Diagnosis not present

## 2018-10-30 DIAGNOSIS — R269 Unspecified abnormalities of gait and mobility: Secondary | ICD-10-CM | POA: Diagnosis not present

## 2018-10-30 DIAGNOSIS — R42 Dizziness and giddiness: Secondary | ICD-10-CM | POA: Diagnosis not present

## 2018-10-30 DIAGNOSIS — M545 Low back pain: Secondary | ICD-10-CM | POA: Diagnosis not present

## 2018-11-01 DIAGNOSIS — M545 Low back pain: Secondary | ICD-10-CM | POA: Diagnosis not present

## 2018-11-01 DIAGNOSIS — R42 Dizziness and giddiness: Secondary | ICD-10-CM | POA: Diagnosis not present

## 2018-11-01 DIAGNOSIS — R269 Unspecified abnormalities of gait and mobility: Secondary | ICD-10-CM | POA: Diagnosis not present

## 2018-11-01 DIAGNOSIS — S32009K Unspecified fracture of unspecified lumbar vertebra, subsequent encounter for fracture with nonunion: Secondary | ICD-10-CM | POA: Diagnosis not present

## 2018-11-04 DIAGNOSIS — R269 Unspecified abnormalities of gait and mobility: Secondary | ICD-10-CM | POA: Diagnosis not present

## 2018-11-04 DIAGNOSIS — M545 Low back pain: Secondary | ICD-10-CM | POA: Diagnosis not present

## 2018-11-04 DIAGNOSIS — S32009K Unspecified fracture of unspecified lumbar vertebra, subsequent encounter for fracture with nonunion: Secondary | ICD-10-CM | POA: Diagnosis not present

## 2018-11-04 DIAGNOSIS — R42 Dizziness and giddiness: Secondary | ICD-10-CM | POA: Diagnosis not present

## 2018-11-06 DIAGNOSIS — R269 Unspecified abnormalities of gait and mobility: Secondary | ICD-10-CM | POA: Diagnosis not present

## 2018-11-06 DIAGNOSIS — S32009K Unspecified fracture of unspecified lumbar vertebra, subsequent encounter for fracture with nonunion: Secondary | ICD-10-CM | POA: Diagnosis not present

## 2018-11-06 DIAGNOSIS — R42 Dizziness and giddiness: Secondary | ICD-10-CM | POA: Diagnosis not present

## 2018-11-06 DIAGNOSIS — M545 Low back pain: Secondary | ICD-10-CM | POA: Diagnosis not present

## 2018-11-12 DIAGNOSIS — R269 Unspecified abnormalities of gait and mobility: Secondary | ICD-10-CM | POA: Diagnosis not present

## 2018-11-12 DIAGNOSIS — S32009K Unspecified fracture of unspecified lumbar vertebra, subsequent encounter for fracture with nonunion: Secondary | ICD-10-CM | POA: Diagnosis not present

## 2018-11-12 DIAGNOSIS — R42 Dizziness and giddiness: Secondary | ICD-10-CM | POA: Diagnosis not present

## 2018-11-12 DIAGNOSIS — M545 Low back pain: Secondary | ICD-10-CM | POA: Diagnosis not present

## 2018-11-15 DIAGNOSIS — M545 Low back pain: Secondary | ICD-10-CM | POA: Diagnosis not present

## 2018-11-15 DIAGNOSIS — R269 Unspecified abnormalities of gait and mobility: Secondary | ICD-10-CM | POA: Diagnosis not present

## 2018-11-15 DIAGNOSIS — S32009K Unspecified fracture of unspecified lumbar vertebra, subsequent encounter for fracture with nonunion: Secondary | ICD-10-CM | POA: Diagnosis not present

## 2018-11-15 DIAGNOSIS — R42 Dizziness and giddiness: Secondary | ICD-10-CM | POA: Diagnosis not present

## 2018-11-19 DIAGNOSIS — R42 Dizziness and giddiness: Secondary | ICD-10-CM | POA: Diagnosis not present

## 2018-11-19 DIAGNOSIS — M545 Low back pain: Secondary | ICD-10-CM | POA: Diagnosis not present

## 2018-11-19 DIAGNOSIS — R269 Unspecified abnormalities of gait and mobility: Secondary | ICD-10-CM | POA: Diagnosis not present

## 2018-11-19 DIAGNOSIS — S32009K Unspecified fracture of unspecified lumbar vertebra, subsequent encounter for fracture with nonunion: Secondary | ICD-10-CM | POA: Diagnosis not present

## 2018-11-22 DIAGNOSIS — S32009K Unspecified fracture of unspecified lumbar vertebra, subsequent encounter for fracture with nonunion: Secondary | ICD-10-CM | POA: Diagnosis not present

## 2018-11-22 DIAGNOSIS — M545 Low back pain: Secondary | ICD-10-CM | POA: Diagnosis not present

## 2018-11-22 DIAGNOSIS — R42 Dizziness and giddiness: Secondary | ICD-10-CM | POA: Diagnosis not present

## 2018-11-22 DIAGNOSIS — R269 Unspecified abnormalities of gait and mobility: Secondary | ICD-10-CM | POA: Diagnosis not present

## 2018-11-25 DIAGNOSIS — R42 Dizziness and giddiness: Secondary | ICD-10-CM | POA: Diagnosis not present

## 2018-11-25 DIAGNOSIS — M545 Low back pain: Secondary | ICD-10-CM | POA: Diagnosis not present

## 2018-11-25 DIAGNOSIS — S32009K Unspecified fracture of unspecified lumbar vertebra, subsequent encounter for fracture with nonunion: Secondary | ICD-10-CM | POA: Diagnosis not present

## 2018-11-25 DIAGNOSIS — R269 Unspecified abnormalities of gait and mobility: Secondary | ICD-10-CM | POA: Diagnosis not present

## 2018-11-27 DIAGNOSIS — Z794 Long term (current) use of insulin: Secondary | ICD-10-CM | POA: Diagnosis not present

## 2018-11-27 DIAGNOSIS — R42 Dizziness and giddiness: Secondary | ICD-10-CM | POA: Diagnosis not present

## 2018-11-27 DIAGNOSIS — R269 Unspecified abnormalities of gait and mobility: Secondary | ICD-10-CM | POA: Diagnosis not present

## 2018-11-27 DIAGNOSIS — H353131 Nonexudative age-related macular degeneration, bilateral, early dry stage: Secondary | ICD-10-CM | POA: Diagnosis not present

## 2018-11-27 DIAGNOSIS — M545 Low back pain: Secondary | ICD-10-CM | POA: Diagnosis not present

## 2018-11-27 DIAGNOSIS — E103293 Type 1 diabetes mellitus with mild nonproliferative diabetic retinopathy without macular edema, bilateral: Secondary | ICD-10-CM | POA: Diagnosis not present

## 2018-11-27 DIAGNOSIS — S32009K Unspecified fracture of unspecified lumbar vertebra, subsequent encounter for fracture with nonunion: Secondary | ICD-10-CM | POA: Diagnosis not present

## 2018-11-27 DIAGNOSIS — H26493 Other secondary cataract, bilateral: Secondary | ICD-10-CM | POA: Diagnosis not present

## 2018-12-04 DIAGNOSIS — R269 Unspecified abnormalities of gait and mobility: Secondary | ICD-10-CM | POA: Diagnosis not present

## 2018-12-04 DIAGNOSIS — R42 Dizziness and giddiness: Secondary | ICD-10-CM | POA: Diagnosis not present

## 2018-12-04 DIAGNOSIS — S32009K Unspecified fracture of unspecified lumbar vertebra, subsequent encounter for fracture with nonunion: Secondary | ICD-10-CM | POA: Diagnosis not present

## 2018-12-04 DIAGNOSIS — M545 Low back pain: Secondary | ICD-10-CM | POA: Diagnosis not present

## 2018-12-06 DIAGNOSIS — M545 Low back pain: Secondary | ICD-10-CM | POA: Diagnosis not present

## 2018-12-06 DIAGNOSIS — R42 Dizziness and giddiness: Secondary | ICD-10-CM | POA: Diagnosis not present

## 2018-12-06 DIAGNOSIS — S32009K Unspecified fracture of unspecified lumbar vertebra, subsequent encounter for fracture with nonunion: Secondary | ICD-10-CM | POA: Diagnosis not present

## 2018-12-06 DIAGNOSIS — R269 Unspecified abnormalities of gait and mobility: Secondary | ICD-10-CM | POA: Diagnosis not present

## 2018-12-09 DIAGNOSIS — R269 Unspecified abnormalities of gait and mobility: Secondary | ICD-10-CM | POA: Diagnosis not present

## 2018-12-09 DIAGNOSIS — R42 Dizziness and giddiness: Secondary | ICD-10-CM | POA: Diagnosis not present

## 2018-12-09 DIAGNOSIS — S32009K Unspecified fracture of unspecified lumbar vertebra, subsequent encounter for fracture with nonunion: Secondary | ICD-10-CM | POA: Diagnosis not present

## 2018-12-09 DIAGNOSIS — M545 Low back pain: Secondary | ICD-10-CM | POA: Diagnosis not present

## 2018-12-11 DIAGNOSIS — M545 Low back pain: Secondary | ICD-10-CM | POA: Diagnosis not present

## 2018-12-11 DIAGNOSIS — R269 Unspecified abnormalities of gait and mobility: Secondary | ICD-10-CM | POA: Diagnosis not present

## 2018-12-11 DIAGNOSIS — S32009K Unspecified fracture of unspecified lumbar vertebra, subsequent encounter for fracture with nonunion: Secondary | ICD-10-CM | POA: Diagnosis not present

## 2018-12-11 DIAGNOSIS — R42 Dizziness and giddiness: Secondary | ICD-10-CM | POA: Diagnosis not present

## 2018-12-16 DIAGNOSIS — M545 Low back pain: Secondary | ICD-10-CM | POA: Diagnosis not present

## 2018-12-16 DIAGNOSIS — R42 Dizziness and giddiness: Secondary | ICD-10-CM | POA: Diagnosis not present

## 2018-12-16 DIAGNOSIS — R269 Unspecified abnormalities of gait and mobility: Secondary | ICD-10-CM | POA: Diagnosis not present

## 2018-12-16 DIAGNOSIS — S32009K Unspecified fracture of unspecified lumbar vertebra, subsequent encounter for fracture with nonunion: Secondary | ICD-10-CM | POA: Diagnosis not present

## 2018-12-18 DIAGNOSIS — S32009K Unspecified fracture of unspecified lumbar vertebra, subsequent encounter for fracture with nonunion: Secondary | ICD-10-CM | POA: Diagnosis not present

## 2018-12-18 DIAGNOSIS — M545 Low back pain: Secondary | ICD-10-CM | POA: Diagnosis not present

## 2018-12-18 DIAGNOSIS — R269 Unspecified abnormalities of gait and mobility: Secondary | ICD-10-CM | POA: Diagnosis not present

## 2018-12-18 DIAGNOSIS — R42 Dizziness and giddiness: Secondary | ICD-10-CM | POA: Diagnosis not present

## 2018-12-23 DIAGNOSIS — M545 Low back pain: Secondary | ICD-10-CM | POA: Diagnosis not present

## 2018-12-23 DIAGNOSIS — R42 Dizziness and giddiness: Secondary | ICD-10-CM | POA: Diagnosis not present

## 2018-12-23 DIAGNOSIS — S32009K Unspecified fracture of unspecified lumbar vertebra, subsequent encounter for fracture with nonunion: Secondary | ICD-10-CM | POA: Diagnosis not present

## 2018-12-23 DIAGNOSIS — R269 Unspecified abnormalities of gait and mobility: Secondary | ICD-10-CM | POA: Diagnosis not present

## 2018-12-27 DIAGNOSIS — S32009K Unspecified fracture of unspecified lumbar vertebra, subsequent encounter for fracture with nonunion: Secondary | ICD-10-CM | POA: Diagnosis not present

## 2018-12-27 DIAGNOSIS — R42 Dizziness and giddiness: Secondary | ICD-10-CM | POA: Diagnosis not present

## 2018-12-27 DIAGNOSIS — R269 Unspecified abnormalities of gait and mobility: Secondary | ICD-10-CM | POA: Diagnosis not present

## 2018-12-27 DIAGNOSIS — M545 Low back pain: Secondary | ICD-10-CM | POA: Diagnosis not present

## 2019-01-15 DIAGNOSIS — Z6829 Body mass index (BMI) 29.0-29.9, adult: Secondary | ICD-10-CM | POA: Diagnosis not present

## 2019-01-15 DIAGNOSIS — S32009K Unspecified fracture of unspecified lumbar vertebra, subsequent encounter for fracture with nonunion: Secondary | ICD-10-CM | POA: Diagnosis not present

## 2019-01-15 DIAGNOSIS — I10 Essential (primary) hypertension: Secondary | ICD-10-CM | POA: Diagnosis not present

## 2019-02-27 DIAGNOSIS — E039 Hypothyroidism, unspecified: Secondary | ICD-10-CM | POA: Diagnosis not present

## 2019-02-27 DIAGNOSIS — E7800 Pure hypercholesterolemia, unspecified: Secondary | ICD-10-CM | POA: Diagnosis not present

## 2019-02-27 DIAGNOSIS — E1140 Type 2 diabetes mellitus with diabetic neuropathy, unspecified: Secondary | ICD-10-CM | POA: Diagnosis not present

## 2019-02-27 DIAGNOSIS — I10 Essential (primary) hypertension: Secondary | ICD-10-CM | POA: Diagnosis not present

## 2019-04-16 DIAGNOSIS — S32009K Unspecified fracture of unspecified lumbar vertebra, subsequent encounter for fracture with nonunion: Secondary | ICD-10-CM | POA: Diagnosis not present

## 2019-05-08 DIAGNOSIS — E1140 Type 2 diabetes mellitus with diabetic neuropathy, unspecified: Secondary | ICD-10-CM | POA: Diagnosis not present

## 2019-05-08 DIAGNOSIS — I10 Essential (primary) hypertension: Secondary | ICD-10-CM | POA: Diagnosis not present

## 2019-05-08 DIAGNOSIS — E1165 Type 2 diabetes mellitus with hyperglycemia: Secondary | ICD-10-CM | POA: Diagnosis not present

## 2019-05-08 DIAGNOSIS — E7800 Pure hypercholesterolemia, unspecified: Secondary | ICD-10-CM | POA: Diagnosis not present

## 2019-05-29 DIAGNOSIS — G5603 Carpal tunnel syndrome, bilateral upper limbs: Secondary | ICD-10-CM | POA: Diagnosis not present

## 2019-05-29 DIAGNOSIS — M19049 Primary osteoarthritis, unspecified hand: Secondary | ICD-10-CM | POA: Diagnosis not present

## 2019-06-04 ENCOUNTER — Encounter: Payer: Self-pay | Admitting: Neurology

## 2019-06-04 ENCOUNTER — Other Ambulatory Visit: Payer: Self-pay

## 2019-06-04 DIAGNOSIS — G5603 Carpal tunnel syndrome, bilateral upper limbs: Secondary | ICD-10-CM

## 2019-06-04 DIAGNOSIS — M19049 Primary osteoarthritis, unspecified hand: Secondary | ICD-10-CM

## 2019-06-12 DIAGNOSIS — Z125 Encounter for screening for malignant neoplasm of prostate: Secondary | ICD-10-CM | POA: Diagnosis not present

## 2019-06-12 DIAGNOSIS — Z1331 Encounter for screening for depression: Secondary | ICD-10-CM | POA: Diagnosis not present

## 2019-06-12 DIAGNOSIS — Z0000 Encounter for general adult medical examination without abnormal findings: Secondary | ICD-10-CM | POA: Diagnosis not present

## 2019-06-12 DIAGNOSIS — E785 Hyperlipidemia, unspecified: Secondary | ICD-10-CM | POA: Diagnosis not present

## 2019-06-12 DIAGNOSIS — Z9181 History of falling: Secondary | ICD-10-CM | POA: Diagnosis not present

## 2019-07-03 ENCOUNTER — Other Ambulatory Visit: Payer: Self-pay

## 2019-07-03 ENCOUNTER — Ambulatory Visit (INDEPENDENT_AMBULATORY_CARE_PROVIDER_SITE_OTHER): Payer: Medicare Other | Admitting: Neurology

## 2019-07-03 DIAGNOSIS — M19049 Primary osteoarthritis, unspecified hand: Secondary | ICD-10-CM

## 2019-07-03 DIAGNOSIS — G5622 Lesion of ulnar nerve, left upper limb: Secondary | ICD-10-CM

## 2019-07-03 DIAGNOSIS — G5603 Carpal tunnel syndrome, bilateral upper limbs: Secondary | ICD-10-CM

## 2019-07-03 NOTE — Procedures (Signed)
[facility]  [place], [place]  [place], [place] [address] Tel: [phone] Fax:  [phone] Test Date:  07/03/2019  Patient: David Mathews DOB: 12-Oct-1941 Physician: Narda Amber, DO  Sex: Male Height: 5\' 9"  Ref Phys: Charlotte Crumb, MD  ID#: [id number] Temp: 34.0C Technician:    Patient Complaints: This is a 78 year old man with history of bilateral CTS release referred for evaluation of bilateral hand paresthesias, worse on the left.  NCV & EMG Findings: Extensive electrodiagnostic testing of the left upper extremity and additional studies of the right shows:  1. Left median and ulnar sensory responses were absent.  Right median sensory response shows prolonged latency (4.3 ms) and reduced amplitude (6.3V).  Bilateral radial and right ulnar sensory responses are within normal limits. 2. Left median motor response shows prolonged latency (4.7 ms) and reduced amplitude (4.3 mV).  Left ulnar motor response at the abductor digit he minimi and first dorsal interosseous muscle shows prolonged distal onset latency (L3.4, L5.7 ms), reduced amplitude (L2.9, L6.0 mV), and decreased conduction velocity (A Elbow-B Elbow, L34, L42 m/s).  Right median motor responses within normal limits. 3. Chronic motor axonal loss changes are seen affecting the left abductor pollicis brevis and ulnar innervated muscles, without accompanied active denervation.  Impression: 1. Left median neuropathy at or distal to the wrist (severe), consistent with a clinical diagnosis of carpal tunnel syndrome.   2. Left ulnar neuropathy with slowing across the elbow, with demyelinating and axonal features, severe. 3. Right median neuropathy at or distal to the wrist (moderate), consistent with a clinical diagnosis of carpal tunnel syndrome. 4. There is no evidence of a sensorimotor polyneuropathy or cervical radiculopathy affecting the upper extremities.   ___________________________ Narda Amber, DO     Nerve Conduction Studies Anti Sensory Summary Table   Site NR Peak (ms) Norm Peak (ms) P-T Amp (V) Norm P-T Amp  Left Median Anti Sensory (2nd Digit)  34C  Wrist NR  <3.8  >10  Right Median Anti Sensory (2nd Digit)  34C  Wrist    4.3 <3.8 6.3 >10  Left Radial Anti Sensory (Base 1st Digit)  34C  Wrist    2.7 <2.8 12.5 >10  Right Radial Anti Sensory (Base 1st Digit)  34C  Wrist    2.7 <2.8 12.9 >10  Left Ulnar Anti Sensory (5th Digit)  34C  Wrist NR  <3.2  >5  Right Ulnar Anti Sensory (5th Digit)  34C  Wrist    3.1 <3.2 9.2 >5   Motor Summary Table   Site NR Onset (ms) Norm Onset (ms) O-P Amp (mV) Norm O-P Amp Site1 Site2 Delta-0 (ms) Dist (cm) Vel (m/s) Norm Vel (m/s)  Left Median Motor (Abd Poll Brev)  34C  Wrist    4.7 <4.0 4.3 >5 Elbow Wrist 6.6 33.0 50 >50  Elbow    11.3  3.7         Right Median Motor (Abd Poll Brev)  34C  Wrist    4.0 <4.0 5.8 >5 Elbow Wrist 6.5 33.0 51 >50  Elbow    10.5  5.2         Left Ulnar Motor (Abd Dig Minimi)  34C  Wrist    3.4 <3.1 2.9 >7 B Elbow Wrist 5.0 25.0 50 >50  B Elbow    8.4  2.6  A Elbow B Elbow 2.9 10.0 34 >50  A Elbow    11.3  2.4  Right Ulnar Motor (Abd Dig Minimi)  34C  Wrist    2.6 <3.1 9.4 >7 B Elbow Wrist 4.5 25.0 56 >50  B Elbow    7.1  8.4  A Elbow B Elbow 2.0 10.0 50 >50  A Elbow    9.1  8.1         Left Ulnar (FDI) Motor (1st DI)  34C  Wrist    5.7 <4.5 6.0 >7 B Elbow Wrist 4.6 25.0 54 >50  B Elbow    10.3  5.7  A Elbow B Elbow 2.4 10.0 42 >50  A Elbow    12.7  5.3          EMG   Side Muscle Ins Act Fibs Psw Fasc Number Recrt Dur Dur. Amp Amp. Poly Poly. Comment  Right 1stDorInt Nml Nml Nml Nml Nml Nml Nml Nml Nml Nml Nml Nml N/A  Right Abd Poll Brev Nml Nml Nml Nml Nml Nml Nml Nml Nml Nml Nml Nml N/A  Right PronatorTeres Nml Nml Nml Nml Nml Nml Nml Nml Nml Nml Nml Nml N/A  Right Biceps Nml Nml Nml Nml Nml Nml Nml Nml Nml Nml Nml Nml N/A  Right Triceps Nml Nml Nml Nml Nml Nml Nml Nml Nml Nml  Nml Nml N/A  Right Deltoid Nml Nml Nml Nml Nml Nml Nml Nml Nml Nml Nml Nml N/A  Left 1stDorInt Nml Nml Nml Nml 2- Rapid Some 1+ Some 1+ Some 1+ N/A  Left Abd Poll Brev Nml Nml Nml Nml 1- Rapid Many 1+ Many 1+ Many 1+ N/A  Left Ext Indicis Nml Nml Nml Nml Nml Nml Nml Nml Nml Nml Nml Nml N/A  Left PronatorTeres Nml Nml Nml Nml Nml Nml Nml Nml Nml Nml Nml Nml N/A  Left Biceps Nml Nml Nml Nml Nml Nml Nml Nml Nml Nml Nml Nml N/A  Left Triceps Nml Nml Nml Nml Nml Nml Nml Nml Nml Nml Nml Nml N/A  Left Deltoid Nml Nml Nml Nml Nml Nml Nml Nml Nml Nml Nml Nml N/A  Left ABD Dig Min Nml Nml Nml Nml 2- Rapid Many 1+ Many 1+ Many 1+ N/A  Left FlexCarpiUln Nml Nml Nml Nml 2- Rapid Some 1+ Some 1+ Some 1+ N/A      Waveforms:

## 2019-07-08 DIAGNOSIS — G5622 Lesion of ulnar nerve, left upper limb: Secondary | ICD-10-CM | POA: Diagnosis not present

## 2019-07-08 DIAGNOSIS — G5603 Carpal tunnel syndrome, bilateral upper limbs: Secondary | ICD-10-CM | POA: Diagnosis not present

## 2019-07-17 DIAGNOSIS — S32009K Unspecified fracture of unspecified lumbar vertebra, subsequent encounter for fracture with nonunion: Secondary | ICD-10-CM | POA: Diagnosis not present

## 2019-07-17 DIAGNOSIS — I10 Essential (primary) hypertension: Secondary | ICD-10-CM | POA: Diagnosis not present

## 2019-07-17 DIAGNOSIS — Z6828 Body mass index (BMI) 28.0-28.9, adult: Secondary | ICD-10-CM | POA: Diagnosis not present

## 2019-07-29 DIAGNOSIS — Z96651 Presence of right artificial knee joint: Secondary | ICD-10-CM | POA: Diagnosis not present

## 2019-07-29 DIAGNOSIS — Z471 Aftercare following joint replacement surgery: Secondary | ICD-10-CM | POA: Diagnosis not present

## 2019-07-31 ENCOUNTER — Other Ambulatory Visit: Payer: Self-pay | Admitting: Physician Assistant

## 2019-07-31 DIAGNOSIS — Z96651 Presence of right artificial knee joint: Secondary | ICD-10-CM

## 2019-08-11 DIAGNOSIS — Z96651 Presence of right artificial knee joint: Secondary | ICD-10-CM | POA: Diagnosis not present

## 2019-08-11 DIAGNOSIS — M25561 Pain in right knee: Secondary | ICD-10-CM | POA: Diagnosis not present

## 2019-08-11 DIAGNOSIS — R262 Difficulty in walking, not elsewhere classified: Secondary | ICD-10-CM | POA: Diagnosis not present

## 2019-08-11 DIAGNOSIS — M6281 Muscle weakness (generalized): Secondary | ICD-10-CM | POA: Diagnosis not present

## 2019-08-12 ENCOUNTER — Telehealth: Payer: Self-pay

## 2019-08-13 ENCOUNTER — Other Ambulatory Visit: Payer: Self-pay | Admitting: *Deleted

## 2019-08-13 DIAGNOSIS — R262 Difficulty in walking, not elsewhere classified: Secondary | ICD-10-CM | POA: Diagnosis not present

## 2019-08-13 DIAGNOSIS — Z96651 Presence of right artificial knee joint: Secondary | ICD-10-CM | POA: Diagnosis not present

## 2019-08-13 DIAGNOSIS — M25561 Pain in right knee: Secondary | ICD-10-CM | POA: Diagnosis not present

## 2019-08-13 DIAGNOSIS — M6281 Muscle weakness (generalized): Secondary | ICD-10-CM | POA: Diagnosis not present

## 2019-08-13 NOTE — Patient Outreach (Signed)
[facility]) Care Management  08/13/2019  David Mathews [birth year] [id number]   Telephone screening call    Referral received : 08/12/19 Referral source: EMMI Join Referral reason : Score 9 on engagement tool Insurance : Medicare   Subjective: Unsuccessful outreach call to patient preferred home number able to leave a HIPAA compliant message for return call. Unsuccessful call attempt to mobile number, message states customer not available unable to leave a message.    Plan Will send unsuccessful outreach letter and plan return call in the next 3 to 4 business days.    Joylene Draft, RN, [place] Management Coordinator  [phone]- Mobile [phone]- Toll Free Main Office

## 2019-08-14 DIAGNOSIS — M25561 Pain in right knee: Secondary | ICD-10-CM | POA: Diagnosis not present

## 2019-08-14 DIAGNOSIS — M6281 Muscle weakness (generalized): Secondary | ICD-10-CM | POA: Diagnosis not present

## 2019-08-14 DIAGNOSIS — R262 Difficulty in walking, not elsewhere classified: Secondary | ICD-10-CM | POA: Diagnosis not present

## 2019-08-14 DIAGNOSIS — Z96651 Presence of right artificial knee joint: Secondary | ICD-10-CM | POA: Diagnosis not present

## 2019-08-15 ENCOUNTER — Other Ambulatory Visit: Payer: Self-pay | Admitting: *Deleted

## 2019-08-15 NOTE — Patient Outreach (Signed)
[facility]) Care Management  08/15/2019  David Mathews 1941/11/10 [id number]   Telephone screening   Referral received : 08/12/19 Referral source: EMMI Join Referral reason : Score 9 on engagement tool  Insurance : Medicare   Subjective Successful outreach call to patient, explained purpose of the call , patient reports awaiting return call.   Social Patient is independent with care, drives to all appointments, he lives at home with his son and daughter in [place]. Patient has a cane for use as needed, has brace for use on right knee. Report knee giving out a few times but denies fall.  He discussed prior [employer] retired, worked with [employer] patient very proud of his service and commended him.   Conditions Patient discussed history of back surgeries, right knee replacements, carpel tunnel surgery. Patient is currently going to outpatient physical therapy.He discussed planned follow CT scan to discuss next step if surgery needed.  Diabetes: Patient discussed 20 years history, he monitors blood sugar at least twice daily . He discussed recent A1c 7.5 and this was down from previous reading of 8.4, reports that he is checked about every 3 months.  He reports reading this am was 66, he ate a banana and plans to eat breakfast,discussed rule of 15 in monitoring blood sugar and stressed importance of rechecking and low blood sugar of 70. He denies having frequent episodes of low blood sugar episodes.  He discussed knowing symptoms of when he blood sugar is low and action to take as well as taking his insulin as directed.  Patient discussed next visit with PCP on 10/20 for labs.  Patient states that he has been managing his diabetes well over the last 20 years and has knowledge about diet and how foods affect his blood sugar.    Medications  Patient manages his daily medications, uses a pill organizer  denies cost concerns.   Advanced Directive  Patient discussed having a  living will in place.   Consent  Discussed [facility] care management program for continued education and support Discussed [facility] care management Health Coach program for telephonic management of chronic condition of Diabetes .  Plan Will send successful outreach letter  Will place order for Health coach for disease management of Diabetes      Joylene Draft, RN, [place] Management Coordinator  [phone]- Mobile [phone]- [place] Office

## 2019-08-18 ENCOUNTER — Encounter (HOSPITAL_COMMUNITY)
Admission: RE | Admit: 2019-08-18 | Discharge: 2019-08-18 | Disposition: A | Discharge status: Home / Self Care | Payer: Medicare Other | Source: Ambulatory Visit | Attending: Physician Assistant | Admitting: Physician Assistant

## 2019-08-18 ENCOUNTER — Other Ambulatory Visit: Payer: Self-pay | Admitting: *Deleted

## 2019-08-18 ENCOUNTER — Other Ambulatory Visit: Payer: Self-pay

## 2019-08-18 DIAGNOSIS — M25562 Pain in left knee: Secondary | ICD-10-CM | POA: Diagnosis not present

## 2019-08-18 DIAGNOSIS — Z96651 Presence of right artificial knee joint: Secondary | ICD-10-CM | POA: Diagnosis not present

## 2019-08-18 DIAGNOSIS — M25561 Pain in right knee: Secondary | ICD-10-CM | POA: Diagnosis not present

## 2019-08-18 MED ORDER — TECHNETIUM TC 99M MEDRONATE IV KIT
18.2000 | PACK | Freq: Once | INTRAVENOUS | Status: AC | PRN
  Administered 2019-08-18: 18.2 via INTRAVENOUS

## 2019-08-19 DIAGNOSIS — R262 Difficulty in walking, not elsewhere classified: Secondary | ICD-10-CM | POA: Diagnosis not present

## 2019-08-19 DIAGNOSIS — Z96651 Presence of right artificial knee joint: Secondary | ICD-10-CM | POA: Diagnosis not present

## 2019-08-19 DIAGNOSIS — M25561 Pain in right knee: Secondary | ICD-10-CM | POA: Diagnosis not present

## 2019-08-19 DIAGNOSIS — M6281 Muscle weakness (generalized): Secondary | ICD-10-CM | POA: Diagnosis not present

## 2019-08-20 DIAGNOSIS — M25561 Pain in right knee: Secondary | ICD-10-CM | POA: Diagnosis not present

## 2019-08-20 DIAGNOSIS — R262 Difficulty in walking, not elsewhere classified: Secondary | ICD-10-CM | POA: Diagnosis not present

## 2019-08-20 DIAGNOSIS — Z96651 Presence of right artificial knee joint: Secondary | ICD-10-CM | POA: Diagnosis not present

## 2019-08-20 DIAGNOSIS — M6281 Muscle weakness (generalized): Secondary | ICD-10-CM | POA: Diagnosis not present

## 2019-08-21 DIAGNOSIS — M6281 Muscle weakness (generalized): Secondary | ICD-10-CM | POA: Diagnosis not present

## 2019-08-21 DIAGNOSIS — M25561 Pain in right knee: Secondary | ICD-10-CM | POA: Diagnosis not present

## 2019-08-21 DIAGNOSIS — Z96651 Presence of right artificial knee joint: Secondary | ICD-10-CM | POA: Diagnosis not present

## 2019-08-21 DIAGNOSIS — R262 Difficulty in walking, not elsewhere classified: Secondary | ICD-10-CM | POA: Diagnosis not present

## 2019-08-25 DIAGNOSIS — R262 Difficulty in walking, not elsewhere classified: Secondary | ICD-10-CM | POA: Diagnosis not present

## 2019-08-25 DIAGNOSIS — Z96651 Presence of right artificial knee joint: Secondary | ICD-10-CM | POA: Diagnosis not present

## 2019-08-25 DIAGNOSIS — M25561 Pain in right knee: Secondary | ICD-10-CM | POA: Diagnosis not present

## 2019-08-25 DIAGNOSIS — M6281 Muscle weakness (generalized): Secondary | ICD-10-CM | POA: Diagnosis not present

## 2019-08-27 DIAGNOSIS — Z96651 Presence of right artificial knee joint: Secondary | ICD-10-CM | POA: Diagnosis not present

## 2019-08-27 DIAGNOSIS — R262 Difficulty in walking, not elsewhere classified: Secondary | ICD-10-CM | POA: Diagnosis not present

## 2019-08-27 DIAGNOSIS — M25561 Pain in right knee: Secondary | ICD-10-CM | POA: Diagnosis not present

## 2019-08-27 DIAGNOSIS — M6281 Muscle weakness (generalized): Secondary | ICD-10-CM | POA: Diagnosis not present

## 2019-08-29 DIAGNOSIS — R262 Difficulty in walking, not elsewhere classified: Secondary | ICD-10-CM | POA: Diagnosis not present

## 2019-08-29 DIAGNOSIS — M6281 Muscle weakness (generalized): Secondary | ICD-10-CM | POA: Diagnosis not present

## 2019-08-29 DIAGNOSIS — M25561 Pain in right knee: Secondary | ICD-10-CM | POA: Diagnosis not present

## 2019-08-29 DIAGNOSIS — Z96651 Presence of right artificial knee joint: Secondary | ICD-10-CM | POA: Diagnosis not present

## 2019-09-01 DIAGNOSIS — R262 Difficulty in walking, not elsewhere classified: Secondary | ICD-10-CM | POA: Diagnosis not present

## 2019-09-01 DIAGNOSIS — M6281 Muscle weakness (generalized): Secondary | ICD-10-CM | POA: Diagnosis not present

## 2019-09-01 DIAGNOSIS — Z96651 Presence of right artificial knee joint: Secondary | ICD-10-CM | POA: Diagnosis not present

## 2019-09-01 DIAGNOSIS — M25561 Pain in right knee: Secondary | ICD-10-CM | POA: Diagnosis not present

## 2019-09-03 ENCOUNTER — Encounter (HOSPITAL_COMMUNITY): Payer: Medicare Other

## 2019-09-03 DIAGNOSIS — R262 Difficulty in walking, not elsewhere classified: Secondary | ICD-10-CM | POA: Diagnosis not present

## 2019-09-03 DIAGNOSIS — Z96651 Presence of right artificial knee joint: Secondary | ICD-10-CM | POA: Diagnosis not present

## 2019-09-03 DIAGNOSIS — T8484XA Pain due to internal orthopedic prosthetic devices, implants and grafts, initial encounter: Secondary | ICD-10-CM | POA: Diagnosis not present

## 2019-09-03 DIAGNOSIS — M6281 Muscle weakness (generalized): Secondary | ICD-10-CM | POA: Diagnosis not present

## 2019-09-03 DIAGNOSIS — M25561 Pain in right knee: Secondary | ICD-10-CM | POA: Diagnosis not present

## 2019-09-09 DIAGNOSIS — M545 Low back pain: Secondary | ICD-10-CM | POA: Diagnosis not present

## 2019-09-09 DIAGNOSIS — Z79899 Other long term (current) drug therapy: Secondary | ICD-10-CM | POA: Diagnosis not present

## 2019-09-09 DIAGNOSIS — Z139 Encounter for screening, unspecified: Secondary | ICD-10-CM | POA: Diagnosis not present

## 2019-09-09 DIAGNOSIS — I10 Essential (primary) hypertension: Secondary | ICD-10-CM | POA: Diagnosis not present

## 2019-09-09 DIAGNOSIS — E7800 Pure hypercholesterolemia, unspecified: Secondary | ICD-10-CM | POA: Diagnosis not present

## 2019-09-09 DIAGNOSIS — E1140 Type 2 diabetes mellitus with diabetic neuropathy, unspecified: Secondary | ICD-10-CM | POA: Diagnosis not present

## 2019-09-09 DIAGNOSIS — Z23 Encounter for immunization: Secondary | ICD-10-CM | POA: Diagnosis not present

## 2019-09-09 DIAGNOSIS — E1165 Type 2 diabetes mellitus with hyperglycemia: Secondary | ICD-10-CM | POA: Diagnosis not present

## 2019-09-09 DIAGNOSIS — Z6829 Body mass index (BMI) 29.0-29.9, adult: Secondary | ICD-10-CM | POA: Diagnosis not present

## 2019-09-09 DIAGNOSIS — Z125 Encounter for screening for malignant neoplasm of prostate: Secondary | ICD-10-CM | POA: Diagnosis not present

## 2019-09-12 ENCOUNTER — Other Ambulatory Visit: Payer: Self-pay | Admitting: *Deleted

## 2019-09-12 NOTE — Patient Outreach (Signed)
[facility]) Care Management  [facility] Care Manager  09/12/2019   David Mathews 06/12/1941 [id number]   RN Health Coach telephone call to patient.  Hipaa compliance verified. Per patient he is doing good. Patient fasting blood sugar is 112. Patient A1C is 7.7. Patient is not on a special diet. Patient has not had any recent falls. Patient has frequent episodes of blood sugar dropping. Per patient he can tell because his vision gets a little blurry. Patient states he always has a little candy near by. Patient has been a diabetic over 20 yrs. Patient uses a cane. Per patient he is having surgery next month for carpal tunnel and trigger finger release. Patient has agreed to follow up outreach calls but does not want a lot of mail sent to him. Per patient he won't read it. Just put it in a file.   Encounter Medications:  Outpatient Encounter Medications as of 09/12/2019  Medication Sig Note  . alfuzosin (UROXATRAL) 10 MG 24 hr tablet Take 10 mg by mouth daily.   [name] amitriptyline (ELAVIL) 10 MG tablet Take 10-20 mg by mouth at bedtime as needed for sleep.   [name] aspirin EC 81 MG tablet Take 81 mg by mouth daily.   . Cholecalciferol (VITAMIN D3) 1000 units CAPS Take 1,000 Units by mouth daily.   . Cyanocobalamin 2500 MCG CHEW Chew 2,500 mcg by mouth daily.   . cyclobenzaprine (FLEXERIL) 10 MG tablet Take 10 mg by mouth 2 (two) times daily as needed for muscle spasms.   . diazepam (VALIUM) 5 MG tablet Take 1-2 tablets (5-10 mg total) by mouth every 6 (six) hours as needed for muscle spasms. (Patient not taking: Reported on 08/15/2019)   . diclofenac (VOLTAREN) 75 MG EC tablet Take 75 mg by mouth 2 (two) times daily.   [name] docusate sodium (COLACE) 100 MG capsule Take 1 capsule (100 mg total) by mouth 2 (two) times daily.   . finasteride (PROSCAR) 5 MG tablet Take 5 mg by mouth every evening.    [name] HYDROcodone-acetaminophen (NORCO) 10-325 MG tablet Take 1-2 tablets by mouth every 4 (four)  hours as needed (breakthrough pain). (Patient not taking: Reported on 08/15/2019)   . insulin aspart (NOVOLOG FLEXPEN) 100 UNIT/ML FlexPen Inject 10-14 Units into the skin 3 (three) times daily with meals. PER SLIDING SCALE 05/27/2017: Dosage verified by the patient and his son  . Insulin Glargine (LANTUS SOLOSTAR) 100 UNIT/ML Solostar Pen Inject 55 Units into the skin daily. Per sliding scale (Patient taking differently: Inject 25 Units into the skin daily before breakfast. ) 05/27/2017: Dosage verified by the patient and his son  . levothyroxine (SYNTHROID, LEVOTHROID) 88 MCG tablet Take 88 mcg by mouth daily before breakfast.   . lisinopril-hydrochlorothiazide (PRINZIDE,ZESTORETIC) 20-25 MG per tablet Take 1 tablet by mouth every evening.    . Magnesium 250 MG TABS Take 250 mg by mouth daily.   . metFORMIN (GLUCOPHAGE) 1000 MG tablet Take 1,000 mg by mouth 2 (two) times daily with a meal.   . Multiple Vitamins-Minerals (MULTIVITAMIN PO) Take 1 tablet by mouth daily.   . polyethylene glycol (MIRALAX / GLYCOLAX) packet Take 17 g by mouth 2 (two) times daily. (Patient taking differently: Take 17 g by mouth daily as needed for moderate constipation. )   . Potassium 99 MG TABS Take 198 mg by mouth 2 (two) times daily.   . pregabalin (LYRICA) 225 MG capsule Take 225 mg by mouth 2 (two) times daily.   [name]  simvastatin (ZOCOR) 20 MG tablet Take 20 mg by mouth every evening.     No facility-administered encounter medications on file as of 09/12/2019.     Functional Status:  In your present state of health, do you have any difficulty performing the following activities: 09/12/2019  Hearing? N  Vision? N  Difficulty concentrating or making decisions? N  Walking or climbing stairs? Y  Comment patient uses a cane/ need knee surgery  Doing errands, shopping? Y  Comment uses a cane or holds onto cart at times  Preparing Food and eating ? N  Using the Toilet? N  In the past six months, have you accidently  leaked urine? N  Do you have problems with loss of bowel control? N  Managing your Medications? N  Managing your Finances? N  Housekeeping or managing your Housekeeping? Y  Comment family assists  Some recent data might be hidden    Fall/Depression Screening: Fall Risk  09/12/2019 10/07/2018 08/02/2015  Falls in the past year? 0 0 No  Comment - Emmi Telephone Survey: data to providers prior to load -  Number falls in past yr: 0 - -  Injury with Fall? 0 - -  Risk for fall due to : Impaired balance/gait;Impaired mobility - -  Follow up Falls evaluation completed;Falls prevention discussed;Education provided - -   PHQ 2/9 Scores 09/12/2019 08/15/2019 08/02/2015  PHQ - 2 Score 0 0 0   THN CM Care Plan Problem One     Most Recent Value  Care Plan Problem One  Knowledge Deficit in Self Management of Diabetes  Role Documenting the Problem One  [place] for Problem One  Active  THN Long Term Goal   Patient will not have a decrease in blood sugar below 70 within the next 90 days  THN Long Term Goal Start Date  09/12/19  Interventions for Problem One Long Term Goal  RN discussed what the blood sugars are running and his episodes of low blood sugar and symptoms. RN sent Living well with diabetes booklet. RN willfollow up for further discussion  THN CM Short Term Goal #1   Patient will have a better understanding of healthy snacks to eat within the next 30 days  THN CM Short Term Goal #1 Start Date  09/12/19  Interventions for Short Term Goal #1  RN discussedeating healthy and patient states he is not on a special diet/ RN sent educational material on healthy snacks to choose from  [facility] CM Short Term Goal #2   Patient will follow up Health Maintenance within the next 30 days  THN CM Short Term Goal #2 Start Date  09/12/19  Interventions for Short Term Goal #2  N discussed Health Maintenance. Patient has gotten flu shot and will need to schedule next eye exam. Patient is following  pandemic precautions. RN will follow up fo compliance      Assessment:  Fasting blood sugar is 112 A1C 7.7 Per patient he usually has about 3 episodes of low blood sugar a week Patient is not on a special diet Patient is able to afford medications Patient is a veteran Patient will benefit from [employer] telephonic outreach for education and support for diabetes self management.  Plan:  RN discussed eating healthy RN discussed hypo and hyperglycemia RN sent Living well with diabetes booklet RN sent a list of healthy snacks patient can eat RN sent Dr barriers letter and assessment RN will follow up within the month of  January  Gean MaidensFrances  BSN RN [facility] Care Management [phone]

## 2019-09-26 DIAGNOSIS — G5622 Lesion of ulnar nerve, left upper limb: Secondary | ICD-10-CM | POA: Diagnosis not present

## 2019-09-26 DIAGNOSIS — G5602 Carpal tunnel syndrome, left upper limb: Secondary | ICD-10-CM | POA: Diagnosis not present

## 2019-09-30 DIAGNOSIS — K219 Gastro-esophageal reflux disease without esophagitis: Secondary | ICD-10-CM | POA: Diagnosis not present

## 2019-09-30 DIAGNOSIS — Z6829 Body mass index (BMI) 29.0-29.9, adult: Secondary | ICD-10-CM | POA: Diagnosis not present

## 2019-09-30 DIAGNOSIS — R221 Localized swelling, mass and lump, neck: Secondary | ICD-10-CM | POA: Diagnosis not present

## 2019-10-13 DIAGNOSIS — T8484XA Pain due to internal orthopedic prosthetic devices, implants and grafts, initial encounter: Secondary | ICD-10-CM | POA: Diagnosis not present

## 2019-10-13 DIAGNOSIS — Z96651 Presence of right artificial knee joint: Secondary | ICD-10-CM | POA: Diagnosis not present

## 2019-10-23 DIAGNOSIS — S32009K Unspecified fracture of unspecified lumbar vertebra, subsequent encounter for fracture with nonunion: Secondary | ICD-10-CM | POA: Diagnosis not present

## 2019-10-30 DIAGNOSIS — M179 Osteoarthritis of knee, unspecified: Secondary | ICD-10-CM | POA: Diagnosis not present

## 2019-10-30 DIAGNOSIS — E1165 Type 2 diabetes mellitus with hyperglycemia: Secondary | ICD-10-CM | POA: Diagnosis not present

## 2019-10-30 DIAGNOSIS — Z01818 Encounter for other preprocedural examination: Secondary | ICD-10-CM | POA: Diagnosis not present

## 2019-10-30 DIAGNOSIS — E1140 Type 2 diabetes mellitus with diabetic neuropathy, unspecified: Secondary | ICD-10-CM | POA: Diagnosis not present
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
## 2019-11-07 NOTE — Patient Instructions (Addendum)
DUE TO COVID-19 ONLY ONE VISITOR IS ALLOWED TO COME WITH YOU AND STAY IN THE WAITING ROOM ONLY DURING PRE OP AND PROCEDURE DAY OF SURGERY. THE 1 VISITOR MAY VISIT WITH YOU AFTER SURGERY IN YOUR PRIVATE ROOM DURING VISITING HOURS ONLY!  YOU NEED TO HAVE A COVID 19 TEST ON: 11/15/2019 @ 9:30 AMTHIS TEST MUST BE DONE BEFORE SURGERY, COME  [address], [place] , [address].  [facility]) ONCE YOUR COVID TEST IS COMPLETED, PLEASE BEGIN THE QUARANTINE INSTRUCTIONS AS OUTLINED IN YOUR HANDOUT.                BAUER AUSBORN    Your procedure is scheduled on: 11/18/2019   Report to [facility] Main  Entrance   Report to admitting at: 10:25 AM     Call this number if you have problems the morning of surgery [phone]    Remember:  BRUSH YOUR TEETH MORNING OF SURGERY AND RINSE YOUR MOUTH OUT, NO CHEWING GUM CANDY OR MINTS.     Take these medicines the morning of surgery with A SIP OF WATER: Urosatral,Famotidine,Finasteride,Synthroid,Tylenol as needed.  NO SOLID FOOD AFTER MIDNIGHT THE NIGHT PRIOR TO SURGERY. NOTHING BY MOUTH EXCEPT CLEAR LIQUIDS UNTIL: 9:50 AM . PLEASE FINISH ENSURE DRINK PER SURGEON ORDER  WHICH NEEDS TO BE COMPLETED AT : 9:50 AM.    CLEAR LIQUID DIET   Foods Allowed                                                                     Foods Excluded  Coffee and tea, regular and decaf                             liquids that you cannot  Plain Jell-O any favor except red or purple                                           see through such as: Fruit ices (not with fruit pulp)                                     milk, soups, orange juice  Iced Popsicles                                    All solid food Carbonated beverages, regular and diet                                    Cranberry, grape and apple juices Sports drinks like Gatorade Lightly seasoned clear broth or consume(fat free) Sugar, honey syrup  Sample Menu Breakfast                                 Lunch  Supper Cranberry juice                    Beef broth                            Chicken broth Jell-O                                     Grape juice                           Apple juice Coffee or tea                        Jell-O                                      Popsicle                                                Coffee or tea                        Coffee or tea  _____________________________________________________________________   How to Manage Your Diabetes Before and After Surgery  Why is it important to control my blood sugar before and after surgery? . Improving blood sugar levels before and after surgery helps healing and can limit problems. . A way of improving blood sugar control is eating a healthy diet by: o  Eating less sugar and carbohydrates o  Increasing activity/exercise o  Talking with your doctor about reaching your blood sugar goals . High blood sugars (greater than 180 mg/dL) can raise your risk of infections and slow your recovery, so you will need to focus on controlling your diabetes during the weeks before surgery. . Make sure that the doctor who takes care of your diabetes knows about your planned surgery including the date and location.  How do I manage my blood sugar before surgery? . Check your blood sugar at least 4 times a day, starting 2 days before surgery, to make sure that the level is not too high or low. o Check your blood sugar the morning of your surgery when you wake up and every 2 hours until you get to the Short Stay unit. . If your blood sugar is less than 70 mg/dL, you will need to treat for low blood sugar: o Do not take insulin. o Treat a low blood sugar (less than 70 mg/dL) with  cup of clear juice (cranberry or apple), 4 glucose tablets, OR glucose gel. o Recheck blood sugar in 15 minutes after treatment (to make sure it is greater than 70 mg/dL). If your blood sugar  is not greater than 70 mg/dL on recheck, call [phone] for further instructions. . Report your blood sugar to the short stay nurse when you get to Short Stay.  . If you are admitted to the hospital after surgery: o Your blood sugar will be checked by the staff and you will probably be given insulin after surgery (instead of oral diabetes medicines) to make sure you have  good blood sugar levels. o The goal for blood sugar control after surgery is 80-180 mg/dL.   WHAT DO I DO ABOUT MY DIABETES MEDICATION?   [name] Do not take oral diabetes medicines (pills) the morning of surgery.  . THE DAY BEFORE SURGERY, take  11   units of Lantus  Insulin. Take your Metformin as usual. Take your Novolog as usual for breakfast and lunch,do not take the dinner dose.       . THE MORNING OF SURGERY, take 11  units of  Lantus   Insulin. Do not take your Metformin.  If your blood sugar is greater than 220 mg/dL, you may take  of your sliding scale Novolog insulin       (correction) dose of insulin.      Patient Signature:  Date:   Nurse Signature:  Date:   Reviewed and Endorsed by [facility] Patient Education Committee, August 2015     [facility] - Preparing for Surgery Before surgery, you can play an important role.  Because skin is not sterile, your skin needs to be as free of germs as possible.  You can reduce the number of germs on your skin by washing with CHG (chlorahexidine gluconate) soap before surgery.  CHG is an antiseptic cleaner which kills germs and bonds with the skin to continue killing germs even after washing. Please DO NOT use if you have an allergy to CHG or antibacterial soaps.  If your skin becomes reddened/irritated stop using the CHG and inform your nurse when you arrive at Short Stay. Do not shave (including legs and underarms) for at least 48 hours prior to the first CHG shower.  You may shave your face/neck. Please follow these instructions carefully:  1.  Shower with CHG  Soap the night before surgery and the  morning of Surgery.  2.  If you choose to wash your hair, wash your hair first as usual with your  normal  shampoo.  3.  After you shampoo, rinse your hair and body thoroughly to remove the  shampoo.                           4.  Use CHG as you would any other liquid soap.  You can apply chg directly  to the skin and wash                       Gently with a scrungie or clean washcloth.  5.  Apply the CHG Soap to your body ONLY FROM THE NECK DOWN.   Do not use on face/ open                           Wound or open sores. Avoid contact with eyes, ears mouth and genitals (private parts).                       Wash face,  Genitals (private parts) with your normal soap.             6.  Wash thoroughly, paying special attention to the area where your surgery  will be performed.  7.  Thoroughly rinse your body with warm water from the neck down.  8.  DO NOT shower/wash with your normal soap after using and rinsing off  the CHG Soap.  9.  Pat yourself dry with a clean towel.            10.  Wear clean pajamas.            11.  Place clean sheets on your bed the night of your first shower and do not  sleep with pets. Day of Surgery : Do not apply any lotions/deodorants the morning of surgery.  Please wear clean clothes to the hospital/surgery center.  FAILURE TO FOLLOW THESE INSTRUCTIONS MAY RESULT IN THE CANCELLATION OF YOUR SURGERY PATIENT SIGNATURE_________________________________  NURSE SIGNATURE__________________________________  ________________________________________________________________________                                        Dennis Bast may not have any metal on your body including hair pins and              piercings  Do not wear jewelry, make-up, lotions, powders or perfumes, deodorant             Do not wear nail polish on your fingernails.  Do not shave  48 hours prior to surgery.              Men may shave face and  neck.   Do not bring valuables to the hospital. [place].  Contacts, dentures or bridgework may not be worn into surgery.  Leave suitcase in the car. After surgery it may be brought to your room.     Patients discharged the day of surgery will not be allowed to drive home. IF YOU ARE HAVING SURGERY AND GOING HOME THE SAME DAY, YOU MUST HAVE AN ADULT TO DRIVE YOU HOME AND BE WITH YOU FOR 24 HOURS. YOU MAY GO HOME BY TAXI OR UBER OR ORTHERWISE, BUT AN ADULT MUST ACCOMPANY YOU HOME AND STAY WITH YOU FOR 24 HOURS.  Name and phone number of your driver:  Special Instructions: N/A              Please read over the following fact sheets you were given: _____________________________________________________________________    Incentive Spirometer  An incentive spirometer is a tool that can help keep your lungs clear and active. This tool measures how well you are filling your lungs with each breath. Taking long deep breaths may help reverse or decrease the chance of developing breathing (pulmonary) problems (especially infection) following:  A long period of time when you are unable to move or be active. BEFORE THE PROCEDURE   If the spirometer includes an indicator to show your best effort, your nurse or respiratory therapist will set it to a desired goal.  If possible, sit up straight or lean slightly forward. Try not to slouch.  Hold the incentive spirometer in an upright position. INSTRUCTIONS FOR USE  1. Sit on the edge of your bed if possible, or sit up as far as you can in bed or on a chair. 2. Hold the incentive spirometer in an upright position. 3. Breathe out normally. 4. Place the mouthpiece in your mouth and seal your lips tightly around it. 5. Breathe in slowly and as deeply as possible, raising the piston or the ball toward the top of the column. 6. Hold your breath for 3-5 seconds or for as long as possible. Allow the piston  or  ball to fall to the bottom of the column. 7. Remove the mouthpiece from your mouth and breathe out normally. 8. Rest for a few seconds and repeat Steps 1 through 7 at least 10 times every 1-2 hours when you are awake. Take your time and take a few normal breaths between deep breaths. 9. The spirometer may include an indicator to show your best effort. Use the indicator as a goal to work toward during each repetition. 10. After each set of 10 deep breaths, practice coughing to be sure your lungs are clear. If you have an incision (the cut made at the time of surgery), support your incision when coughing by placing a pillow or rolled up towels firmly against it. Once you are able to get out of bed, walk around indoors and cough well. You may stop using the incentive spirometer when instructed by your caregiver.  RISKS AND COMPLICATIONS  Take your time so you do not get dizzy or light-headed.  If you are in pain, you may need to take or ask for pain medication before doing incentive spirometry. It is harder to take a deep breath if you are having pain. AFTER USE  Rest and breathe slowly and easily.  It can be helpful to keep track of a log of your progress. Your caregiver can provide you with a simple table to help with this. If you are using the spirometer at home, follow these instructions: SEEK MEDICAL CARE IF:   You are having difficultly using the spirometer.  You have trouble using the spirometer as often as instructed.  Your pain medication is not giving enough relief while using the spirometer.  You develop fever of 100.5 F (38.1 C) or higher. SEEK IMMEDIATE MEDICAL CARE IF:   You cough up bloody sputum that had not been present before.  You develop fever of 102 F (38.9 C) or greater.  You develop worsening pain at or near the incision site. MAKE SURE YOU:   Understand these instructions.  Will watch your condition.  Will get help right away if you are not doing  well or get worse. Document Released: 03/19/2007 Document Revised: 01/29/2012 Document Reviewed: 05/20/2007 ExitCare Patient Information 2014 [employer], [place].   ________________________________________________________________________  WHAT IS A BLOOD TRANSFUSION? Blood Transfusion Information  A transfusion is the replacement of blood or some of its parts. Blood is made up of multiple cells which provide different functions.  Red blood cells carry oxygen and are used for blood loss replacement.  White blood cells fight against infection.  Platelets control bleeding.  Plasma helps clot blood.  Other blood products are available for specialized needs, such as hemophilia or other clotting disorders. BEFORE THE TRANSFUSION  Who gives blood for transfusions?   Healthy volunteers who are fully evaluated to make sure their blood is safe. This is blood bank blood. Transfusion therapy is the safest it has ever been in the practice of medicine. Before blood is taken from a donor, a complete history is taken to make sure that person has no history of diseases nor engages in risky social behavior (examples are intravenous drug use or sexual activity with multiple partners). The donor's travel history is screened to minimize risk of transmitting infections, such as malaria. The donated blood is tested for signs of infectious diseases, such as HIV and hepatitis. The blood is then tested to be sure it is compatible with you in order to minimize the chance of a transfusion reaction. If you or a  relative donates blood, this is often done in anticipation of surgery and is not appropriate for emergency situations. It takes many days to process the donated blood. RISKS AND COMPLICATIONS Although transfusion therapy is very safe and saves many lives, the main dangers of transfusion include:   Getting an infectious disease.  Developing a transfusion reaction. This is an allergic reaction to something in the  blood you were given. Every precaution is taken to prevent this. The decision to have a blood transfusion has been considered carefully by your caregiver before blood is given. Blood is not given unless the benefits outweigh the risks. AFTER THE TRANSFUSION  Right after receiving a blood transfusion, you will usually feel much better and more energetic. This is especially true if your red blood cells have gotten low (anemic). The transfusion raises the level of the red blood cells which carry oxygen, and this usually causes an energy increase.  The nurse administering the transfusion will monitor you carefully for complications. HOME CARE INSTRUCTIONS  No special instructions are needed after a transfusion. You may find your energy is better. Speak with your caregiver about any limitations on activity for underlying diseases you may have. SEEK MEDICAL CARE IF:   Your condition is not improving after your transfusion.  You develop redness or irritation at the intravenous (IV) site. SEEK IMMEDIATE MEDICAL CARE IF:  Any of the following symptoms occur over the next 12 hours:  Shaking chills.  You have a temperature by mouth above 102 F (38.9 C), not controlled by medicine.  Chest, back, or muscle pain.  People around you feel you are not acting correctly or are confused.  Shortness of breath or difficulty breathing.  Dizziness and fainting.  You get a rash or develop hives.  You have a decrease in urine output.  Your urine turns a dark color or changes to pink, red, or brown. Any of the following symptoms occur over the next 10 days:  You have a temperature by mouth above 102 F (38.9 C), not controlled by medicine.  Shortness of breath.  Weakness after normal activity.  The white part of the eye turns yellow (jaundice).  You have a decrease in the amount of urine or are urinating less often.  Your urine turns a dark color or changes to pink, red, or brown. Document  Released: 11/03/2000 Document Revised: 01/29/2012 Document Reviewed: 06/22/2008 [facility] Patient Information 2014 [place], [place].  _______________________________________________________________________

## 2019-11-10 ENCOUNTER — Encounter (HOSPITAL_COMMUNITY)
Admission: RE | Admit: 2019-11-10 | Discharge: 2019-11-10 | Disposition: A | Discharge status: Home / Self Care | Payer: Medicare Other | Source: Ambulatory Visit | Attending: Orthopedic Surgery | Admitting: Orthopedic Surgery

## 2019-11-10 ENCOUNTER — Encounter (HOSPITAL_COMMUNITY): Payer: Self-pay

## 2019-11-10 ENCOUNTER — Other Ambulatory Visit: Payer: Self-pay

## 2019-11-10 DIAGNOSIS — I491 Atrial premature depolarization: Secondary | ICD-10-CM | POA: Diagnosis not present

## 2019-11-10 DIAGNOSIS — Z01818 Encounter for other preprocedural examination: Secondary | ICD-10-CM | POA: Diagnosis present

## 2019-11-10 LAB — BASIC METABOLIC PANEL
Anion gap: 10 (ref 5–15)
BUN: 11 mg/dL (ref 8–23)
CO2: 28 mmol/L (ref 22–32)
Calcium: 9.4 mg/dL (ref 8.9–10.3)
Chloride: 91 mmol/L — ABNORMAL LOW (ref 98–111)
Creatinine, Ser: 0.73 mg/dL (ref 0.61–1.24)
GFR calc Af Amer: >60 mL/min (ref >60)
GFR calc non Af Amer: >60 mL/min (ref >60)
Glucose, Bld: 129 mg/dL — ABNORMAL HIGH (ref 70–99)
Potassium: 4.6 mmol/L (ref 3.5–5.1)
Sodium: 129 mmol/L — ABNORMAL LOW (ref 135–145)

## 2019-11-10 LAB — SURGICAL PCR SCREEN
MRSA, PCR: NEGATIVE
Staphylococcus aureus: POSITIVE — AB

## 2019-11-10 LAB — CBC
HCT: 39.4 % (ref 39.0–52.0)
Hemoglobin: 13.1 g/dL (ref 13.0–17.0)
MCH: 30.3 pg (ref 26.0–34.0)
MCHC: 33.2 g/dL (ref 30.0–36.0)
MCV: 91 fL (ref 80.0–100.0)
Platelets: 259 10*3/uL (ref 150–400)
RBC: 4.33 MIL/uL (ref 4.22–5.81)
RDW: 12.8 % (ref 11.5–15.5)
WBC: 6.3 10*3/uL (ref 4.0–10.5)
nRBC: 0 % (ref 0.0–0.2)

## 2019-11-10 LAB — GLUCOSE, CAPILLARY: Glucose-Capillary: 128 mg/dL — ABNORMAL HIGH (ref 70–99)

## 2019-11-10 LAB — HEMOGLOBIN A1C
Hgb A1c MFr Bld: 8.6 % — ABNORMAL HIGH (ref 4.8–5.6)
Mean Plasma Glucose: 200.12 mg/dL

## 2019-11-10 NOTE — Progress Notes (Addendum)
PCP - Cyndi Bender Cardiologist -   Chest x-ray -  EKG -  Stress Test -  ECHO -  Cardiac Cath -   Sleep Study - Many years ago,was incompleate. CPAP - N/A  Fasting Blood Sugar -  Checks Blood Sugar _____ times a day  Blood Thinner Instructions:[name] he will stop on 11/11/2019 Aspirin Instructions: Last Dose:  Anesthesia review:   Patient denies shortness of breath, fever, cough and chest pain at PAT appointment   Patient verbalized understanding of instructions that were given to them at the PAT appointment. Patient was also instructed that they will need to review over the PAT instructions again at home before surgery.

## 2019-11-12 NOTE — Progress Notes (Signed)
Anesthesia Chart Review  A1C 8.6 at PA visit, Dr. Alvan Dame made aware.  Sodium 129 at PAT visit.  Forwarded to Dr. Alvan Dame and PCP.  Will order recheck DOS if Dr. Alvan Dame proceeds with case in light of elevated A1C.   Maia Plan [facility] Pre-Surgical Testing [phone] 11/12/19  11:49 AM

## 2019-11-15 ENCOUNTER — Other Ambulatory Visit (HOSPITAL_COMMUNITY)
Admission: RE | Admit: 2019-11-15 | Discharge: 2019-11-15 | Disposition: A | Discharge status: Home / Self Care | Payer: Medicare Other | Source: Ambulatory Visit | Attending: Orthopedic Surgery | Admitting: Orthopedic Surgery

## 2019-11-15 DIAGNOSIS — Z20828 Contact with and (suspected) exposure to other viral communicable diseases: Secondary | ICD-10-CM | POA: Insufficient documentation

## 2019-11-15 DIAGNOSIS — Z01812 Encounter for preprocedural laboratory examination: Secondary | ICD-10-CM | POA: Insufficient documentation

## 2019-11-15 LAB — SARS CORONAVIRUS 2 (TAT 6-24 HRS): SARS Coronavirus 2: NEGATIVE

## 2019-11-17 NOTE — Progress Notes (Signed)
Pt advised that surgery on 11-18-19 time has changed from 12:55 PM to 11:26 AM. Pt to arrived to Admitting at 9:00 AM, and to have his G2 consumed by 8:30 AM. Pt verbalized understanding.

## 2019-11-18 ENCOUNTER — Inpatient Hospital Stay (HOSPITAL_COMMUNITY): Payer: Medicare Other | Admitting: Physician Assistant

## 2019-11-18 ENCOUNTER — Encounter (HOSPITAL_COMMUNITY): Payer: Self-pay | Admitting: Orthopedic Surgery

## 2019-11-18 ENCOUNTER — Inpatient Hospital Stay (HOSPITAL_COMMUNITY): Payer: Medicare Other | Admitting: Anesthesiology

## 2019-11-18 ENCOUNTER — Encounter (HOSPITAL_COMMUNITY)
Admission: RE | Disposition: A | Discharge status: Home / Self Care | Payer: Self-pay | Source: Home / Self Care | Attending: Orthopedic Surgery

## 2019-11-18 ENCOUNTER — Inpatient Hospital Stay (HOSPITAL_COMMUNITY)
Admission: RE | Admit: 2019-11-18 | Discharge: 2019-11-18 | DRG: 468 | Disposition: A | Discharge status: Home / Self Care | Payer: Medicare Other | Attending: Orthopedic Surgery | Admitting: Orthopedic Surgery

## 2019-11-18 ENCOUNTER — Other Ambulatory Visit: Payer: Self-pay

## 2019-11-18 DIAGNOSIS — I10 Essential (primary) hypertension: Secondary | ICD-10-CM | POA: Diagnosis present

## 2019-11-18 DIAGNOSIS — K219 Gastro-esophageal reflux disease without esophagitis: Secondary | ICD-10-CM | POA: Diagnosis present

## 2019-11-18 DIAGNOSIS — T84032A Mechanical loosening of internal right knee prosthetic joint, initial encounter: Principal | ICD-10-CM | POA: Diagnosis present

## 2019-11-18 DIAGNOSIS — Z7982 Long term (current) use of aspirin: Secondary | ICD-10-CM

## 2019-11-18 DIAGNOSIS — Z20828 Contact with and (suspected) exposure to other viral communicable diseases: Secondary | ICD-10-CM | POA: Diagnosis present

## 2019-11-18 DIAGNOSIS — Z87891 Personal history of nicotine dependence: Secondary | ICD-10-CM

## 2019-11-18 DIAGNOSIS — Z96651 Presence of right artificial knee joint: Secondary | ICD-10-CM | POA: Diagnosis not present

## 2019-11-18 DIAGNOSIS — Z79899 Other long term (current) drug therapy: Secondary | ICD-10-CM | POA: Diagnosis not present

## 2019-11-18 DIAGNOSIS — Z791 Long term (current) use of non-steroidal anti-inflammatories (NSAID): Secondary | ICD-10-CM

## 2019-11-18 DIAGNOSIS — E039 Hypothyroidism, unspecified: Secondary | ICD-10-CM | POA: Diagnosis present

## 2019-11-18 DIAGNOSIS — Z833 Family history of diabetes mellitus: Secondary | ICD-10-CM

## 2019-11-18 DIAGNOSIS — Z79890 Hormone replacement therapy (postmenopausal): Secondary | ICD-10-CM

## 2019-11-18 DIAGNOSIS — E119 Type 2 diabetes mellitus without complications: Secondary | ICD-10-CM | POA: Diagnosis present

## 2019-11-18 DIAGNOSIS — N400 Enlarged prostate without lower urinary tract symptoms: Secondary | ICD-10-CM | POA: Diagnosis present

## 2019-11-18 DIAGNOSIS — Z794 Long term (current) use of insulin: Secondary | ICD-10-CM

## 2019-11-18 DIAGNOSIS — Y792 Prosthetic and other implants, materials and accessory orthopedic devices associated with adverse incidents: Secondary | ICD-10-CM | POA: Diagnosis present

## 2019-11-18 HISTORY — PX: TOTAL KNEE REVISION: SHX996

## 2019-11-18 LAB — BASIC METABOLIC PANEL
Anion gap: 11 (ref 5–15)
BUN: 14 mg/dL (ref 8–23)
CO2: 25 mmol/L (ref 22–32)
Calcium: 9.4 mg/dL (ref 8.9–10.3)
Chloride: 95 mmol/L — ABNORMAL LOW (ref 98–111)
Creatinine, Ser: 0.71 mg/dL (ref 0.61–1.24)
GFR calc Af Amer: >60 mL/min (ref >60)
GFR calc non Af Amer: >60 mL/min (ref >60)
Glucose, Bld: 204 mg/dL — ABNORMAL HIGH (ref 70–99)
Potassium: 4.3 mmol/L (ref 3.5–5.1)
Sodium: 131 mmol/L — ABNORMAL LOW (ref 135–145)

## 2019-11-18 LAB — GLUCOSE, CAPILLARY
Glucose-Capillary: 169 mg/dL — ABNORMAL HIGH (ref 70–99)
Glucose-Capillary: 181 mg/dL — ABNORMAL HIGH (ref 70–99)
Glucose-Capillary: 354 mg/dL — ABNORMAL HIGH (ref 70–99)

## 2019-11-18 LAB — TYPE AND SCREEN
ABO/RH(D): B POS
Antibody Screen: NEGATIVE

## 2019-11-18 LAB — POCT I-STAT, CHEM 8
BUN: 17 mg/dL (ref 8–23)
Calcium, Ion: 0.9 mmol/L — ABNORMAL LOW (ref 1.15–1.40)
Chloride: 96 mmol/L — ABNORMAL LOW (ref 98–111)
Creatinine, Ser: 0.6 mg/dL — ABNORMAL LOW (ref 0.61–1.24)
Glucose, Bld: 201 mg/dL — ABNORMAL HIGH (ref 70–99)
HCT: 44 % (ref 39.0–52.0)
Hemoglobin: 15 g/dL (ref 13.0–17.0)
Potassium: 5.7 mmol/L — ABNORMAL HIGH (ref 3.5–5.1)
Sodium: 126 mmol/L — ABNORMAL LOW (ref 135–145)
TCO2: 24 mmol/L (ref 22–32)

## 2019-11-18 SURGERY — TOTAL KNEE REVISION
Anesthesia: General | Site: Knee | Laterality: Right

## 2019-11-18 MED ORDER — PHENYLEPHRINE HCL-NACL 10-0.9 MG/250ML-% IV SOLN
INTRAVENOUS | Status: AC
  Filled 2019-11-18: qty 250

## 2019-11-18 MED ORDER — METOCLOPRAMIDE HCL 5 MG/ML IJ SOLN: 5.0000 mg | Freq: Three times a day (TID) | INTRAMUSCULAR | Status: DC | PRN

## 2019-11-18 MED ORDER — PROPOFOL 500 MG/50ML IV EMUL
INTRAVENOUS | Status: AC
  Filled 2019-11-18: qty 50

## 2019-11-18 MED ORDER — HYDROMORPHONE HCL 1 MG/ML IJ SOLN: 0.2500 mg | INTRAMUSCULAR | Status: DC | PRN

## 2019-11-18 MED ORDER — CELECOXIB 200 MG PO CAPS
200.0000 mg | ORAL_CAPSULE | Freq: Two times a day (BID) | ORAL | Status: DC
  Administered 2019-11-18: 15:00:00 200 mg via ORAL

## 2019-11-18 MED ORDER — ONDANSETRON HCL 4 MG/2ML IJ SOLN
INTRAMUSCULAR | Status: AC
  Filled 2019-11-18: qty 2

## 2019-11-18 MED ORDER — FENTANYL CITRATE (PF) 100 MCG/2ML IJ SOLN
INTRAMUSCULAR | Status: AC
  Filled 2019-11-18: qty 2

## 2019-11-18 MED ORDER — ROPIVACAINE HCL 7.5 MG/ML IJ SOLN
INTRAMUSCULAR | Status: DC | PRN
  Administered 2019-11-18: 20 mL via PERINEURAL

## 2019-11-18 MED ORDER — PREGABALIN 75 MG PO CAPS
225.0000 mg | ORAL_CAPSULE | Freq: Two times a day (BID) | ORAL | Status: DC
  Administered 2019-11-18: 15:00:00 225 mg via ORAL
  Filled 2019-11-18: qty 3

## 2019-11-18 MED ORDER — SODIUM CHLORIDE 0.9 % IV SOLN: INTRAVENOUS | Status: DC

## 2019-11-18 MED ORDER — PROPOFOL 10 MG/ML IV BOLUS
INTRAVENOUS | Status: DC | PRN
  Administered 2019-11-18: 110 mg via INTRAVENOUS

## 2019-11-18 MED ORDER — PROMETHAZINE HCL 25 MG/ML IJ SOLN: 6.2500 mg | INTRAMUSCULAR | Status: DC | PRN

## 2019-11-18 MED ORDER — DEXAMETHASONE SODIUM PHOSPHATE 10 MG/ML IJ SOLN
INTRAMUSCULAR | Status: AC
  Filled 2019-11-18: qty 1

## 2019-11-18 MED ORDER — FENTANYL CITRATE (PF) 100 MCG/2ML IJ SOLN
INTRAMUSCULAR | Status: DC | PRN
  Administered 2019-11-18: 100 ug via INTRAVENOUS
  Administered 2019-11-18 (×2): 25 ug via INTRAVENOUS
  Administered 2019-11-18: 50 ug via INTRAVENOUS

## 2019-11-18 MED ORDER — DEXAMETHASONE SODIUM PHOSPHATE 10 MG/ML IJ SOLN
10.0000 mg | Freq: Once | INTRAMUSCULAR | Status: AC
  Administered 2019-11-18: 8 mg via INTRAVENOUS

## 2019-11-18 MED ORDER — EPHEDRINE 5 MG/ML INJ
INTRAVENOUS | Status: AC
  Filled 2019-11-18: qty 10

## 2019-11-18 MED ORDER — POLYETHYLENE GLYCOL 3350 17 G PO PACK: 17.0000 g | PACK | Freq: Two times a day (BID) | ORAL | Status: DC

## 2019-11-18 MED ORDER — LIDOCAINE HCL (CARDIAC) PF 100 MG/5ML IV SOSY
PREFILLED_SYRINGE | INTRAVENOUS | Status: DC | PRN
  Administered 2019-11-18: 50 mg via INTRAVENOUS

## 2019-11-18 MED ORDER — CYCLOBENZAPRINE HCL 10 MG PO TABS: 10.0000 mg | ORAL_TABLET | Freq: Two times a day (BID) | ORAL | 0 refills | Status: AC | PRN

## 2019-11-18 MED ORDER — ONDANSETRON HCL 4 MG PO TABS: 4.0000 mg | ORAL_TABLET | Freq: Four times a day (QID) | ORAL | Status: DC | PRN

## 2019-11-18 MED ORDER — TRANEXAMIC ACID-NACL 1000-0.7 MG/100ML-% IV SOLN
1000.0000 mg | INTRAVENOUS | Status: AC
  Administered 2019-11-18: 1000 mg via INTRAVENOUS
  Filled 2019-11-18: qty 100

## 2019-11-18 MED ORDER — FENTANYL CITRATE (PF) 100 MCG/2ML IJ SOLN
50.0000 ug | INTRAMUSCULAR | Status: DC
  Administered 2019-11-18: 11:00:00 25 ug via INTRAVENOUS
  Filled 2019-11-18: qty 2

## 2019-11-18 MED ORDER — DIPHENHYDRAMINE HCL 12.5 MG/5ML PO ELIX: 12.5000 mg | ORAL_SOLUTION | ORAL | Status: DC | PRN

## 2019-11-18 MED ORDER — PROPOFOL 10 MG/ML IV BOLUS
INTRAVENOUS | Status: AC
  Filled 2019-11-18: qty 20

## 2019-11-18 MED ORDER — ACETAMINOPHEN 10 MG/ML IV SOLN: 1000.0000 mg | Freq: Once | INTRAVENOUS | Status: DC | PRN

## 2019-11-18 MED ORDER — CEFAZOLIN SODIUM-DEXTROSE 2-4 GM/100ML-% IV SOLN
INTRAVENOUS | Status: AC
  Administered 2019-11-18: 18:00:00 2 g via INTRAVENOUS
  Filled 2019-11-18: qty 100

## 2019-11-18 MED ORDER — CEFAZOLIN SODIUM-DEXTROSE 2-4 GM/100ML-% IV SOLN
2.0000 g | INTRAVENOUS | Status: AC
  Administered 2019-11-18: 11:00:00 2 g via INTRAVENOUS
  Filled 2019-11-18: qty 100

## 2019-11-18 MED ORDER — ASPIRIN 81 MG PO CHEW: 81.0000 mg | CHEWABLE_TABLET | Freq: Two times a day (BID) | ORAL | 0 refills | Status: AC

## 2019-11-18 MED ORDER — CHLORHEXIDINE GLUCONATE 4 % EX LIQD: 60.0000 mL | Freq: Once | CUTANEOUS | Status: DC

## 2019-11-18 MED ORDER — PROPOFOL 500 MG/50ML IV EMUL: INTRAVENOUS | Status: DC | PRN

## 2019-11-18 MED ORDER — ALUM & MAG HYDROXIDE-SIMETH 200-200-20 MG/5ML PO SUSP: 15.0000 mL | ORAL | Status: DC | PRN

## 2019-11-18 MED ORDER — FAMOTIDINE 20 MG PO TABS
40.0000 mg | ORAL_TABLET | Freq: Every day | ORAL | Status: DC
  Administered 2019-11-18: 40 mg via ORAL
  Filled 2019-11-18: qty 2

## 2019-11-18 MED ORDER — LACTATED RINGERS IV SOLN: INTRAVENOUS | Status: DC

## 2019-11-18 MED ORDER — MENTHOL 3 MG MT LOZG: 1.0000 | LOZENGE | OROMUCOSAL | Status: DC | PRN

## 2019-11-18 MED ORDER — HYDROMORPHONE HCL 1 MG/ML IJ SOLN: 0.5000 mg | INTRAMUSCULAR | Status: DC | PRN

## 2019-11-18 MED ORDER — FERROUS SULFATE 325 (65 FE) MG PO TABS: 325.0000 mg | ORAL_TABLET | Freq: Three times a day (TID) | ORAL | 0 refills | Status: DC

## 2019-11-18 MED ORDER — TOBRAMYCIN SULFATE 1.2 G IJ SOLR
INTRAMUSCULAR | Status: DC | PRN
  Administered 2019-11-18: 1.2 g

## 2019-11-18 MED ORDER — SIMVASTATIN 20 MG PO TABS
20.0000 mg | ORAL_TABLET | Freq: Every evening | ORAL | Status: DC
  Administered 2019-11-18: 17:00:00 20 mg via ORAL
  Filled 2019-11-18: qty 1

## 2019-11-18 MED ORDER — ACETAMINOPHEN 325 MG PO TABS: 325.0000 mg | ORAL_TABLET | Freq: Four times a day (QID) | ORAL | Status: DC | PRN

## 2019-11-18 MED ORDER — SODIUM CHLORIDE (PF) 0.9 % IJ SOLN
INTRAMUSCULAR | Status: DC | PRN
  Administered 2019-11-18: 30 mL

## 2019-11-18 MED ORDER — BUPIVACAINE HCL (PF) 0.25 % IJ SOLN
INTRAMUSCULAR | Status: DC | PRN
  Administered 2019-11-18: 30 mL

## 2019-11-18 MED ORDER — HYDROCODONE-ACETAMINOPHEN 7.5-325 MG PO TABS: 1.0000 | ORAL_TABLET | ORAL | Status: DC | PRN

## 2019-11-18 MED ORDER — PROPOFOL 500 MG/50ML IV EMUL
INTRAVENOUS | Status: DC | PRN
  Administered 2019-11-18: 125 ug/kg/min via INTRAVENOUS

## 2019-11-18 MED ORDER — BISACODYL 10 MG RE SUPP: 10.0000 mg | Freq: Every day | RECTAL | Status: DC | PRN

## 2019-11-18 MED ORDER — AMITRIPTYLINE HCL 10 MG PO TABS: 10.0000 mg | ORAL_TABLET | Freq: Every evening | ORAL | Status: DC | PRN

## 2019-11-18 MED ORDER — ALFUZOSIN HCL ER 10 MG PO TB24: 10.0000 mg | ORAL_TABLET | Freq: Every day | ORAL | Status: DC

## 2019-11-18 MED ORDER — ONDANSETRON HCL 4 MG/2ML IJ SOLN
INTRAMUSCULAR | Status: DC | PRN
  Administered 2019-11-18: 4 mg via INTRAVENOUS

## 2019-11-18 MED ORDER — SODIUM CHLORIDE 0.9 % IR SOLN
Status: DC | PRN
  Administered 2019-11-18: 1000 mL

## 2019-11-18 MED ORDER — METHOCARBAMOL 500 MG IVPB - SIMPLE MED
INTRAVENOUS | Status: AC
  Filled 2019-11-18: qty 50

## 2019-11-18 MED ORDER — ONDANSETRON HCL 4 MG/2ML IJ SOLN: 4.0000 mg | Freq: Four times a day (QID) | INTRAMUSCULAR | Status: DC | PRN

## 2019-11-18 MED ORDER — INSULIN GLARGINE 100 UNIT/ML SOLOSTAR PEN: 22.0000 [IU] | PEN_INJECTOR | Freq: Every day | SUBCUTANEOUS | Status: DC

## 2019-11-18 MED ORDER — VANCOMYCIN HCL 1000 MG IV SOLR
INTRAVENOUS | Status: DC | PRN
  Administered 2019-11-18: 1000 mg via TOPICAL
  Administered 2019-11-18: 1000 mg

## 2019-11-18 MED ORDER — POVIDONE-IODINE 10 % EX SWAB
2.0000 "application " | Freq: Once | CUTANEOUS | Status: AC
  Administered 2019-11-18: 2 via TOPICAL

## 2019-11-18 MED ORDER — PHENOL 1.4 % MT LIQD: 1.0000 | OROMUCOSAL | Status: DC | PRN

## 2019-11-18 MED ORDER — TRANEXAMIC ACID-NACL 1000-0.7 MG/100ML-% IV SOLN
INTRAVENOUS | Status: AC
  Filled 2019-11-18: qty 100

## 2019-11-18 MED ORDER — KETOROLAC TROMETHAMINE 30 MG/ML IJ SOLN
INTRAMUSCULAR | Status: DC | PRN
  Administered 2019-11-18: 30 mg via INTRAVENOUS

## 2019-11-18 MED ORDER — LEVOTHYROXINE SODIUM 88 MCG PO TABS: 88.0000 ug | ORAL_TABLET | Freq: Every day | ORAL | Status: DC

## 2019-11-18 MED ORDER — POLYETHYLENE GLYCOL 3350 17 G PO PACK: 17.0000 g | PACK | Freq: Two times a day (BID) | ORAL | 0 refills | Status: AC

## 2019-11-18 MED ORDER — METHOCARBAMOL 500 MG IVPB - SIMPLE MED
500.0000 mg | Freq: Four times a day (QID) | INTRAVENOUS | Status: DC | PRN
  Administered 2019-11-18: 500 mg via INTRAVENOUS

## 2019-11-18 MED ORDER — FERROUS SULFATE 325 (65 FE) MG PO TABS
325.0000 mg | ORAL_TABLET | Freq: Two times a day (BID) | ORAL | Status: DC
  Administered 2019-11-18: 17:00:00 325 mg via ORAL
  Filled 2019-11-18: qty 1

## 2019-11-18 MED ORDER — MEPERIDINE HCL 50 MG/ML IJ SOLN: 6.2500 mg | INTRAMUSCULAR | Status: DC | PRN

## 2019-11-18 MED ORDER — DEXAMETHASONE SODIUM PHOSPHATE 10 MG/ML IJ SOLN: 10.0000 mg | Freq: Once | INTRAMUSCULAR | Status: DC

## 2019-11-18 MED ORDER — METFORMIN HCL 500 MG PO TABS
1000.0000 mg | ORAL_TABLET | Freq: Two times a day (BID) | ORAL | Status: DC
  Administered 2019-11-18: 1000 mg via ORAL
  Filled 2019-11-18: qty 2

## 2019-11-18 MED ORDER — ACETAMINOPHEN 325 MG PO TABS: 325.0000 mg | ORAL_TABLET | Freq: Once | ORAL | Status: DC | PRN

## 2019-11-18 MED ORDER — FINASTERIDE 5 MG PO TABS
5.0000 mg | ORAL_TABLET | Freq: Every evening | ORAL | Status: DC
  Filled 2019-11-18: qty 1

## 2019-11-18 MED ORDER — CELECOXIB 200 MG PO CAPS
ORAL_CAPSULE | ORAL | Status: AC
  Filled 2019-11-18: qty 1

## 2019-11-18 MED ORDER — INSULIN ASPART 100 UNIT/ML ~~LOC~~ SOLN: 0.0000 [IU] | Freq: Every day | SUBCUTANEOUS | Status: DC

## 2019-11-18 MED ORDER — SODIUM CHLORIDE 0.9 % IR SOLN
Status: DC | PRN
  Administered 2019-11-18: 3000 mL

## 2019-11-18 MED ORDER — CEFAZOLIN SODIUM-DEXTROSE 2-4 GM/100ML-% IV SOLN: 2.0000 g | Freq: Four times a day (QID) | INTRAVENOUS | Status: DC

## 2019-11-18 MED ORDER — DOCUSATE SODIUM 100 MG PO CAPS
100.0000 mg | ORAL_CAPSULE | Freq: Two times a day (BID) | ORAL | Status: DC
  Administered 2019-11-18: 15:00:00 100 mg via ORAL
  Filled 2019-11-18: qty 1

## 2019-11-18 MED ORDER — MAGNESIUM CITRATE PO SOLN: 1.0000 | Freq: Once | ORAL | Status: DC | PRN

## 2019-11-18 MED ORDER — METHOCARBAMOL 500 MG PO TABS: 500.0000 mg | ORAL_TABLET | Freq: Four times a day (QID) | ORAL | Status: DC | PRN

## 2019-11-18 MED ORDER — ACETAMINOPHEN 160 MG/5ML PO SOLN: 325.0000 mg | Freq: Once | ORAL | Status: DC | PRN

## 2019-11-18 MED ORDER — METOCLOPRAMIDE HCL 5 MG PO TABS: 5.0000 mg | ORAL_TABLET | Freq: Three times a day (TID) | ORAL | Status: DC | PRN

## 2019-11-18 MED ORDER — INSULIN ASPART 100 UNIT/ML ~~LOC~~ SOLN
0.0000 [IU] | Freq: Three times a day (TID) | SUBCUTANEOUS | Status: DC
  Administered 2019-11-18: 11 [IU] via SUBCUTANEOUS

## 2019-11-18 MED ORDER — DOCUSATE SODIUM 100 MG PO CAPS: 100.0000 mg | ORAL_CAPSULE | Freq: Two times a day (BID) | ORAL | 0 refills | Status: AC

## 2019-11-18 MED ORDER — HYDROCODONE-ACETAMINOPHEN 5-325 MG PO TABS: 1.0000 | ORAL_TABLET | ORAL | Status: DC | PRN

## 2019-11-18 MED ORDER — ASPIRIN 81 MG PO CHEW: 81.0000 mg | CHEWABLE_TABLET | Freq: Two times a day (BID) | ORAL | Status: DC

## 2019-11-18 MED ORDER — HYDROCODONE-ACETAMINOPHEN 5-325 MG PO TABS: 1.0000 | ORAL_TABLET | Freq: Four times a day (QID) | ORAL | 0 refills | Status: DC | PRN

## 2019-11-18 MED ORDER — EPHEDRINE SULFATE 50 MG/ML IJ SOLN
INTRAMUSCULAR | Status: DC | PRN
  Administered 2019-11-18 (×2): 5 mg via INTRAVENOUS

## 2019-11-18 MED ORDER — INSULIN ASPART 100 UNIT/ML ~~LOC~~ SOLN
SUBCUTANEOUS | Status: AC
  Filled 2019-11-18: qty 1

## 2019-11-18 MED ORDER — DOXYCYCLINE HYCLATE 50 MG PO CAPS: 50.0000 mg | ORAL_CAPSULE | Freq: Two times a day (BID) | ORAL | 0 refills | Status: DC

## 2019-11-18 MED ORDER — STERILE WATER FOR IRRIGATION IR SOLN
Status: DC | PRN
  Administered 2019-11-18: 2000 mL

## 2019-11-18 MED ORDER — TRANEXAMIC ACID-NACL 1000-0.7 MG/100ML-% IV SOLN
1000.0000 mg | Freq: Once | INTRAVENOUS | Status: AC
  Administered 2019-11-18: 16:00:00 1000 mg via INTRAVENOUS

## 2019-11-18 SURGICAL SUPPLY — 64 items
ADAPTER BOLT FEMORAL +2/-2 (Knees) ×3 IMPLANT
AUGMENT FMRL PST PFC SIG SZ4 8 (Orthopedic Implant) ×2 IMPLANT
AUGMENT POSTEERIOR PFC SZ4 RT (Knees) ×2 IMPLANT
BAG DECANTER FOR FLEXI CONT (MISCELLANEOUS) IMPLANT
BAG ZIPLOCK 12X15 (MISCELLANEOUS) IMPLANT
BLADE SAW SGTL 11.0X1.19X90.0M (BLADE) IMPLANT
BLADE SAW SGTL 13.0X1.19X90.0M (BLADE) ×3 IMPLANT
BLADE SAW SGTL 81X20 HD (BLADE) ×3 IMPLANT
BLADE SURG SZ10 CARB STEEL (BLADE) ×6 IMPLANT
BNDG ELASTIC 6X5.8 VLCR STR LF (GAUZE/BANDAGES/DRESSINGS) ×3 IMPLANT
BOWL SMART MIX CTS (DISPOSABLE) ×3 IMPLANT
BRUSH FEMORAL CANAL (MISCELLANEOUS) ×3 IMPLANT
CEMENT HV SMART SET (Cement) ×6 IMPLANT
COMP FEM CEM RT SZ4 (Orthopedic Implant) ×3 IMPLANT
COMPONENT FEM CEM RT SZ4 (Orthopedic Implant) ×1 IMPLANT
COVER SURGICAL LIGHT HANDLE (MISCELLANEOUS) ×3 IMPLANT
COVER WAND RF STERILE (DRAPES) IMPLANT
CUFF TOURN SGL QUICK 34 (TOURNIQUET CUFF) ×2
CUFF TRNQT CYL 34X4.125X (TOURNIQUET CUFF) ×1 IMPLANT
DECANTER SPIKE VIAL GLASS SM (MISCELLANEOUS) ×3 IMPLANT
DERMABOND ADVANCED (GAUZE/BANDAGES/DRESSINGS) ×2
DERMABOND ADVANCED .7 DNX12 (GAUZE/BANDAGES/DRESSINGS) ×1 IMPLANT
DRAPE U-SHAPE 47X51 STRL (DRAPES) ×3 IMPLANT
DRESSING AQUACEL AG SP 3.5X10 (GAUZE/BANDAGES/DRESSINGS) ×1 IMPLANT
DRSG AQUACEL AG ADV 3.5X10 (GAUZE/BANDAGES/DRESSINGS) ×3 IMPLANT
DRSG AQUACEL AG SP 3.5X10 (GAUZE/BANDAGES/DRESSINGS) ×3
DURAPREP 26ML APPLICATOR (WOUND CARE) ×6 IMPLANT
ELECT REM PT RETURN 15FT ADLT (MISCELLANEOUS) ×3 IMPLANT
FEM POST AUG PFC SIGMA SZ4 8MM (Orthopedic Implant) ×6 IMPLANT
FEMORAL ADAPTER (Orthopedic Implant) ×3 IMPLANT
GLOVE BIOGEL PI IND STRL 7.5 (GLOVE) ×1 IMPLANT
GLOVE BIOGEL PI IND STRL 8.5 (GLOVE) ×1 IMPLANT
GLOVE BIOGEL PI INDICATOR 7.5 (GLOVE) ×2
GLOVE BIOGEL PI INDICATOR 8.5 (GLOVE) ×2
GLOVE ECLIPSE 8.0 STRL XLNG CF (GLOVE) ×6 IMPLANT
GLOVE ORTHO TXT STRL SZ7.5 (GLOVE) ×3 IMPLANT
GOWN STRL REUS W/TWL 2XL LVL3 (GOWN DISPOSABLE) ×3 IMPLANT
GOWN STRL REUS W/TWL LRG LVL3 (GOWN DISPOSABLE) ×3 IMPLANT
GOWN STRL REUS W/TWL XL LVL3 (GOWN DISPOSABLE) IMPLANT
HANDPIECE INTERPULSE COAX TIP (DISPOSABLE) ×2
HOLDER FOLEY CATH W/STRAP (MISCELLANEOUS) IMPLANT
KIT TURNOVER KIT A (KITS) IMPLANT
MANIFOLD NEPTUNE II (INSTRUMENTS) ×3 IMPLANT
NDL SAFETY ECLIPSE 18X1.5 (NEEDLE) IMPLANT
NEEDLE HYPO 18GX1.5 SHARP (NEEDLE)
NS IRRIG 1000ML POUR BTL (IV SOLUTION) ×3 IMPLANT
PACK TOTAL KNEE CUSTOM (KITS) ×3 IMPLANT
PENCIL SMOKE EVACUATOR (MISCELLANEOUS) IMPLANT
PLATE ROT INSERT 15MM SIZE 4 (Plate) ×3 IMPLANT
POSTERIOR AUGMENT PFC SZ4 RT (Knees) ×6 IMPLANT
PROTECTOR NERVE ULNAR (MISCELLANEOUS) ×3 IMPLANT
RESTRICTOR CEMENT SZ 5 C-STEM (Cement) ×3 IMPLANT
SET HNDPC FAN SPRY TIP SCT (DISPOSABLE) ×1 IMPLANT
SET PAD KNEE POSITIONER (MISCELLANEOUS) ×3 IMPLANT
STEM TIBIA PFC 13X60MM (Stem) ×3 IMPLANT
SUT MNCRL AB 3-0 PS2 18 (SUTURE) ×3 IMPLANT
SUT STRATAFIX PDS+ 0 24IN (SUTURE) ×3 IMPLANT
SUT VIC AB 1 CT1 36 (SUTURE) ×3 IMPLANT
SUT VIC AB 2-0 CT1 27 (SUTURE) ×6
SUT VIC AB 2-0 CT1 TAPERPNT 27 (SUTURE) ×3 IMPLANT
TRAY FOLEY MTR SLVR 16FR STAT (SET/KITS/TRAYS/PACK) ×3 IMPLANT
WATER STERILE IRR 1000ML POUR (IV SOLUTION) ×6 IMPLANT
WRAP KNEE MAXI GEL POST OP (GAUZE/BANDAGES/DRESSINGS) ×3 IMPLANT
YANKAUER SUCT BULB TIP 10FT TU (MISCELLANEOUS) IMPLANT

## 2019-11-18 NOTE — Brief Op Note (Signed)
g12/29/2020  11:23 AM  PATIENT:  David Mathews  78 y.o. male  PRE-OPERATIVE DIAGNOSIS:  Failed right total knee arthroplasty, aseptic loosening of femoral component  POST-OPERATIVE DIAGNOSIS:   Failed right total knee arthroplasty, aseptic loosening of femoral component  PROCEDURE:  Procedure(s) with comments: TOTAL KNEE REVISION (Right) - 2 hrs  SURGEON:  Surgeon(s) and Role:    Paralee Cancel, MD - Primary  PHYSICIAN ASSISTANT: Danae Orleans, PA-C  ANESTHESIA:   regional and general  EBL:  <100cc   BLOOD ADMINISTERED:none  DRAINS: none   LOCAL MEDICATIONS USED:  MARCAINE     SPECIMEN:  No Specimen  DISPOSITION OF SPECIMEN:  N/A  COUNTS:  YES  TOURNIQUET:  59 min at 250 mmHg  DICTATION: .Other Dictation: Dictation Number [id number]  PLAN OF CARE: Admit to inpatient   PATIENT DISPOSITION:  PACU - hemodynamically stable.   Delay start of Pharmacological VTE agent (>24hrs) due to surgical blood loss or risk of bleeding: no

## 2019-11-18 NOTE — Interval H&P Note (Signed)
History and Physical Interval Note:  11/18/2019 9:52 AM  David Mathews  has presented today for surgery, with the diagnosis of Failed right total knee arthroplasty.  The various methods of treatment have been discussed with the patient and family. After consideration of risks, benefits and other options for treatment, the patient has consented to  Procedure(s) with comments: TOTAL KNEE REVISION (Right) - 2 hrs as a surgical intervention.  The patient's history has been reviewed, patient examined, no change in status, stable for surgery.  I have reviewed the patient's chart and labs.  Questions were answered to the patient's satisfaction.     Mauri Pole

## 2019-11-18 NOTE — Anesthesia Procedure Notes (Signed)
Procedure Name: LMA Insertion Date/Time: 11/18/2019 11:11 AM Performed by: Glory Buff, CRNA Pre-anesthesia Checklist: Patient identified, Emergency Drugs available, Suction available and Patient being monitored Patient Re-evaluated:Patient Re-evaluated prior to induction Oxygen Delivery Method: Circle system utilized Preoxygenation: Pre-oxygenation with 100% oxygen Induction Type: IV induction LMA: LMA inserted LMA Size: 4.0 Number of attempts: 1 Placement Confirmation: positive ETCO2 Tube secured with: Tape Dental Injury: Teeth and Oropharynx as per pre-operative assessment

## 2019-11-18 NOTE — Discharge Instructions (Signed)

## 2019-11-18 NOTE — Progress Notes (Signed)
Assisted Dr. Hollis with right, ultrasound guided, pectoralis block. Side rails up, monitors on throughout procedure. See vital signs in flow sheet. Tolerated Procedure well. 

## 2019-11-18 NOTE — Anesthesia Preprocedure Evaluation (Signed)
Anesthesia Evaluation  Patient identified by MRN, date of birth, ID band Patient awake    Reviewed: Allergy & Precautions, NPO status , Patient's Chart, lab work & pertinent test results  Airway Mallampati: I  TM Distance: >3 FB Neck ROM: Full    Dental  (+) Edentulous Upper, Edentulous Lower   Pulmonary former smoker,    Pulmonary exam normal        Cardiovascular hypertension, Pt. on medications  Rhythm:Regular Rate:Normal     Neuro/Psych  Neuromuscular disease negative psych ROS   GI/Hepatic GERD  ,  Endo/Other  diabetes, Type 2, Insulin Dependent, Oral Hypoglycemic AgentsHypothyroidism   Renal/GU      Musculoskeletal   Abdominal Normal abdominal exam  (+)   Peds  Hematology   Anesthesia Other Findings   Reproductive/Obstetrics                           Anesthesia Physical Anesthesia Plan  ASA: III  Anesthesia Plan: General   Post-op Pain Management: GA combined w/ Regional for post-op pain   Induction: Intravenous  PONV Risk Score and Plan: 2 and Ondansetron and Propofol infusion  Airway Management Planned: LMA  Additional Equipment: None  Intra-op Plan:   Post-operative Plan: Extubation in OR  Informed Consent: I have reviewed the patients History and Physical, chart, labs and discussed the procedure including the risks, benefits and alternatives for the proposed anesthesia with the patient or authorized representative who has indicated his/her understanding and acceptance.     Dental advisory given  Plan Discussed with: CRNA  Anesthesia Plan Comments: (No spinal due to back surgery history. )       Anesthesia Quick Evaluation

## 2019-11-18 NOTE — Transfer of Care (Signed)
Immediate Anesthesia Transfer of Care Note  Patient: David Mathews  Procedure(s) Performed: TOTAL KNEE REVISION (Right Knee)  Patient Location: PACU  Anesthesia Type:General  Level of Consciousness: drowsy, patient cooperative and responds to stimulation  Airway & Oxygen Therapy: Patient Spontanous Breathing and Patient connected to face mask oxygen  Post-op Assessment: Report given to RN and Post -op Vital signs reviewed and stable  Post vital signs: Reviewed and stable  Last Vitals:  Vitals Value Taken Time  BP 139/80 11/18/19 1336  Temp    Pulse 78 11/18/19 1339  Resp 16 11/18/19 1339  SpO2 100 % 11/18/19 1339  Vitals shown include unvalidated device data.  Last Pain:  Vitals:   11/18/19 0920  TempSrc: Oral      Patients Stated Pain Goal: 4 (50/03/70 4888)  Complications: No apparent anesthesia complications

## 2019-11-18 NOTE — Anesthesia Postprocedure Evaluation (Signed)
Anesthesia Post Note  Patient: David Mathews  Procedure(s) Performed: TOTAL KNEE REVISION (Right Knee)     Patient location during evaluation: PACU Anesthesia Type: General Level of consciousness: awake and alert Pain management: pain level controlled Vital Signs Assessment: post-procedure vital signs reviewed and stable Respiratory status: spontaneous breathing, nonlabored ventilation, respiratory function stable and patient connected to nasal cannula oxygen Cardiovascular status: blood pressure returned to baseline and stable Postop Assessment: no apparent nausea or vomiting Anesthetic complications: no    Last Vitals:  Vitals:   11/18/19 1630 11/18/19 1706  BP: 118/74 137/82  Pulse: 88 94  Resp: 18 17  Temp: 36.7 C   SpO2: 95% 98%    Last Pain:  Vitals:   11/18/19 1706  TempSrc:   PainSc: 2                  Effie Berkshire

## 2019-11-18 NOTE — Anesthesia Procedure Notes (Addendum)
Anesthesia Regional Block: Adductor canal block   Pre-Anesthetic Checklist: ,, timeout performed, Correct Patient, Correct Site, Correct Laterality, Correct Procedure, Correct Position, site marked, Risks and benefits discussed,  Surgical consent,  Pre-op evaluation,  At surgeon's request and post-op pain management  Laterality: Right  Prep: chloraprep       Needles:  Injection technique: Single-shot  Needle Type: Echogenic Stimulator Needle     Needle Length: 9cm  Needle Gauge: 21     Additional Needles:   Procedures:,,,, ultrasound used (permanent image in chart),,,,  Narrative:  Start time: 11/18/2019 10:40 AM End time: 11/18/2019 10:48 AM Injection made incrementally with aspirations every 5 mL.  Performed by: Personally  Anesthesiologist: Effie Berkshire, MD  Additional Notes: Patient tolerated the procedure well. Local anesthetic introduced in an incremental fashion under minimal resistance after negative aspirations. No paresthesias were elicited. After completion of the procedure, no acute issues were identified and patient continued to be monitored by RN.

## 2019-11-18 NOTE — Op Note (Signed)
NAME: David Mathews, David Mathews MEDICAL RECORD VF:[id number] ACCOUNT [id number] DATE OF BIRTH:1940/12/23 FACILITY: [facility] LOCATION: [facility] PHYSICIAN: DAlvan Dame, MD  OPERATIVE REPORT  DATE OF PROCEDURE:  11/18/2019  PREOPERATIVE DIAGNOSIS:  Failed right total knee arthroplasty, aseptic loosening of the femoral component.  POSTOPERATIVE DIAGNOSIS:  Failed right total knee arthroplasty, aseptic loosening of the femoral component.  PROCEDURE:  Revision of right total knee replacement.  COMPONENTS USED:  DePuy Sigma revision knee system with a size 4 TC3 femur with 4 mm distal, medial and lateral augments, 8 mm posterior, medial and lateral augments, a 13 x 60 mm cemented stem, with a cement restrictor and a size 15 mm posterior  stabilized insert to match the size 4 femur.  SURGEON:  Paralee Cancel, MD  ASSISTANT:  Danae Orleans PA-C.  Note that [name] was present for the entirety of the case from preoperative positioning, perioperative management of the operative extremity, general facilitation of the case and primary wound closure.  ANESTHESIA:  Regional plus LMA.  SPECIMENS:  None.  COMPLICATIONS:  None.  ESTIMATED BLOOD LOSS:  Less  than 100 mL.  TOURNIQUET:  Up for 59 minutes at 250 mmHg.  INDICATIONS  FOR PROCEDURE:  The patient is a very pleasant 78 year old male with history on this right knee of a partial knee arthroplasty with failure leading to revision.  He had a revision tibial component, but the femoral component was used as a  primary.  Unfortunately, now several years out and multiple other surgeries including several lumbar spine surgeries, he began having right knee pain.  There were no significant findings for infection or concern for infection with clinical workup.  A  bone scan was ordered and confirmed suspicion for loosening.  Radiographs otherwise appeared to be relatively normal appearing.  Given these findings, this seem to be a very plausible source of  his pain given the fact he has failed to respond to  conservative measures.  We reviewed the risks of infection, DVT, component failure, need for future surgeries as he has been through this before.  Consent was obtained for benefit of pain relief.  PROCEDURE IN DETAIL:  The patient was brought to the operative theater.  Once adequate anesthesia, preoperative antibiotics, Ancef administered, the patient was positioned supine with a right thigh tourniquet in place.  The right lower extremity was then  prepped and draped in sterile fashion.  Timeout was performed, identifying the patient, the planned procedure and extremity.  The leg was exsanguinated, tourniquet elevated to 250 mmHg.  His old incision was utilized.  Soft tissue planes created.  He  was noted to have synovialization of his prepatellar bursa.  This was partially excised.  The arthrotomy was then made, encountering clear synovial fluid.  With the joint exposed, I performed a significant synovectomy in the medial and lateral aspects of  the joint as well as suprapatellar.  He was noted to have a significant hypertrophic scarring and synovitic response in the parapatellar region.  Once I had the knee fully debrided, the knee easily flexed and retractors were placed.  I was then able to  remove the old polyethylene insert.  When I then assessed the femoral component, it basically pulled out manually without need for any further debridement.  I removed all the soft tissues.  There did appear to be significant fibrous interposition between  the cement and bone.  This was removed.  I then prepared the canal up to 15 mm reamers and then a canal brush  irrigator to remove any further debris and soft tissue.  At this point, I felt that a size 4 femoral component would work best.  There were no  significant cuts that needed to be made.  I did evaluate in the routine fashion per protocol to assess for the use of augments, which I selected 8 mm augments  posteriorly at this point, medially and laterally as well as 4 mm distal augments.  We did a  trial reduction with a trial femoral component with the aforementioned components, including the trial stem and found that with a 15 mm insert, the knee came to extension and flexion with stable ligaments.  The patella tracked through the trochlea.   Given all these findings, the trial components were removed.  The final components were opened on the back table.  They were configured under my direct supervision and assistance.  A canal cement restrictor was placed into the distal femur.  We irrigated  the knee with normal saline solution.  The synovial capsular junction was injected with 0.25% Marcaine without epinephrine, 1 mL of Toradol and saline.   Cement was mixed.  I did mix up 2 batches of cement with 1 g of vancomycin and 1.2 of tobramycin.   The cement was placed into the canal.  The femoral component was then cemented on top of this, with a cement layer over all metal components.  I then brought the knee to extension with a 15 mm insert.  Extruded cement was removed.  Once the cement fully  cured, excess cement was removed throughout the knee.  At that point, the tourniquet was let down.  No significant hemostasis.  Given the range of motion, both in extension and flexion, the stability of the ligaments with this construct, I selected the  size 15 mm posterior stabilized insert to match the 4 femur.  This was placed in the tibia.  The knee was reduced.  The knee had been irrigated throughout the case and again at this point.  The knee was then flexed.  The extensor mechanism was  reapproximated using #1 Vicryl and a #1 Stratafix suture.  The remainder of the wound was closed with 2-0 Vicryl and a running Monocryl stitch.  The knee was then cleaned, dried and dressed sterilely using surgical glue and Aquacel dressing.  The patient  was brought to the recovery room in stable condition, tolerating the  procedure well.  Findings were reviewed with family.  He will be in the hospital overnight for IV antibiotics and initiate his therapy to return.  I will see him back in the office in  2 weeks, otherwise.  VN/NUANCE  D:11/18/2019 T:11/18/2019 JOB:[id number]

## 2019-11-18 NOTE — Evaluation (Signed)
Physical Therapy Evaluation Patient Details Name: David Mathews MRN: [id number] DOB: Sep 27, 1941 Today's Date: 11/18/2019   History of Present Illness  Patient is 78 y.o. male s/p Rt TKR on 11/18/19 with PMH significant for DM, HTN, lumbar surgery (posterior lateral L4-5, L5-S1 revision with placement of of S2 alar iliac wing screws).    Clinical Impression  David Mathews is a 78 y.o. male POD 0 s/p Rt TKR. Patient reports independence with mobility at baseline. Patient is now limited by functional impairments (see PT problem list below) and requires min guard/supervision for transfers and gait with RW. Patient was able to ambulate ~140 feet with RW and supervision and cues for safe walker management. Patient educated on safe sequencing for stair mobility and verbalized safe guarding position for people assisting with mobility. Patient instructed in exercises to facilitate ROM and circulation. Patient will benefit from continued skilled PT interventions to address impairments and progress towards PLOF. Patient has met mobility goals at adequate level for discharge home; will continue to follow if pt continues acute stay to progress towards Mod I goals.    Follow Up Recommendations Follow surgeon's recommendation for DC plan and follow-up therapies    Equipment Recommendations  None recommended by PT    Recommendations for Other Services       Precautions / Restrictions Precautions Precautions: Fall Restrictions Weight Bearing Restrictions: No      Mobility  Bed Mobility Overal bed mobility: Needs Assistance Bed Mobility: Supine to Sit;Sit to Supine     Supine to sit: Supervision;HOB elevated Sit to supine: Supervision;HOB elevated   General bed mobility comments: no cues or assist required  Transfers Overall transfer level: Needs assistance Equipment used: Rolling walker (2 wheeled) Transfers: Sit to/from Stand Sit to Stand: Supervision         General transfer  comment: verbal cues for safe hand placement and tehcnique with RW, no overt LOB, no assist required for power up  Ambulation/Gait Ambulation/Gait assistance: Supervision;Min guard Gait Distance (Feet): 140 Feet Assistive device: Rolling walker (2 wheeled) Gait Pattern/deviations: Step-through pattern;Decreased stance time - right;Decreased step length - left Gait velocity: decreased   General Gait Details: cues for safe hand placement and proximity to RW, no overt LOB noted and pt maintained safe position throughout.  Stairs Stairs: Yes Stairs assistance: Min guard Stair Management: Backwards;Forwards;Two rails;With walker Number of Stairs: 4(1x1, 1x3) General stair comments: verbal cues for safe sequencing of reverse step up to ascend singel stair with RW, and cues for forward to ascend with hand rails. pt verablized understanding of safe guarding position for those helping.  Wheelchair Mobility    Modified Rankin (Stroke Patients Only)       Balance Overall balance assessment: Needs assistance Sitting-balance support: Feet supported;No upper extremity supported Sitting balance-Leahy Scale: Good     Standing balance support: During functional activity;Bilateral upper extremity supported Standing balance-Leahy Scale: Fair            Pertinent Vitals/Pain Pain Assessment: 0-10 Pain Score: 2  Pain Location: Rt knee Pain Descriptors / Indicators: Aching;Sore Pain Intervention(s): Limited activity within patient's tolerance;Monitored during session    Home Living Family/patient expects to be discharged to:: Private residence Living Arrangements: Non-relatives/Friends;Children Available Help at Discharge: Family;Available PRN/intermittently;Available 24 hours/day Type of Home: House Home Access: Stairs to enter   [employer] of Steps: no the firs but second has rails and 12 'wal between Home Layout: Two level Home Equipment: Walker - 2 wheels;Walker - 4  wheels;Shower seat -  built in;Toilet riser      Prior Function Level of Independence: Independent               Hand Dominance   Dominant Hand: Left    Extremity/Trunk Assessment   Upper Extremity Assessment Upper Extremity Assessment: Overall WFL for tasks assessed    Lower Extremity Assessment Lower Extremity Assessment: RLE deficits/detail RLE Deficits / Details: good quad activation, no extensor lag with SLR, 4/5 for quad strength with MMT RLE Sensation: WNL RLE Coordination: WNL    Cervical / Trunk Assessment Cervical / Trunk Assessment: Normal  Communication   Communication: No difficulties  Cognition Arousal/Alertness: Awake/alert Behavior During Therapy: WFL for tasks assessed/performed Overall Cognitive Status: Within Functional Limits for tasks assessed         General Comments      Exercises Total Joint Exercises Ankle Circles/Pumps: AROM;10 reps;Supine;Both Quad Sets: AROM;5 reps;Supine;Right Short Arc Quad: AROM;5 reps;Supine;Right Heel Slides: 5 reps;Supine;Right;AAROM Hip ABduction/ADduction: AROM;5 reps;Supine;Right Straight Leg Raises: AROM;5 reps;Supine;Right Long [employer]: AROM;5 reps;Seated;Right Knee Flexion: AROM;AAROM;10 reps;Seated;Right   Assessment/Plan    PT Assessment Patient needs continued PT services  PT Problem List Decreased strength;Decreased range of motion;Decreased balance;Decreased mobility;Decreased activity tolerance;Decreased knowledge of use of DME       PT Treatment Interventions DME instruction;Functional mobility training;Balance training;Patient/family education;Therapeutic activities;Gait training;Stair training;Therapeutic exercise    PT Goals (Current goals can be found in the Care Plan section)  Acute Rehab PT Goals Patient Stated Goal: go home tonight PT Goal Formulation: With patient Time For Goal Achievement: 11/25/19 Potential to Achieve Goals: Good    Frequency 7X/week    AM-PAC PT "6  Clicks" Mobility  Outcome Measure Help needed turning from your back to your side while in a flat bed without using bedrails?: None Help needed moving from lying on your back to sitting on the side of a flat bed without using bedrails?: None Help needed moving to and from a bed to a chair (including a wheelchair)?: A Little Help needed standing up from a chair using your arms (e.g., wheelchair or bedside chair)?: A Little Help needed to walk in hospital room?: A Little Help needed climbing 3-5 steps with a railing? : A Little 6 Click Score: 20    End of Session Equipment Utilized During Treatment: Gait belt Activity Tolerance: Patient tolerated treatment well Patient left: in bed;with call bell/phone within reach Nurse Communication: Mobility status PT Visit Diagnosis: Muscle weakness (generalized) (M62.81);Difficulty in walking, not elsewhere classified (R26.2)    Time: 9733-1250 PT Time Calculation (min) (ACUTE ONLY): 46 min   Charges:   PT Evaluation $PT Eval Low Complexity: 1 Low PT Treatments $Gait Training: 8-22 mins $Therapeutic Exercise: 8-22 mins        Gwynneth Albright PT, DPT Physical Therapist with [facility]  11/18/2019 7:30 PM

## 2019-11-19 ENCOUNTER — Encounter: Payer: Self-pay | Admitting: *Deleted
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
## 2019-11-24 ENCOUNTER — Encounter: Payer: Self-pay | Admitting: *Deleted

## 2019-11-26 DIAGNOSIS — Z96651 Presence of right artificial knee joint: Secondary | ICD-10-CM | POA: Diagnosis not present

## 2019-11-26 DIAGNOSIS — T85698A Other mechanical complication of other specified internal prosthetic devices, implants and grafts, initial encounter: Secondary | ICD-10-CM | POA: Diagnosis not present

## 2019-11-28 DIAGNOSIS — T85698A Other mechanical complication of other specified internal prosthetic devices, implants and grafts, initial encounter: Secondary | ICD-10-CM | POA: Diagnosis not present

## 2019-11-28 DIAGNOSIS — Z96651 Presence of right artificial knee joint: Secondary | ICD-10-CM | POA: Diagnosis not present

## 2019-12-01 DIAGNOSIS — Z96651 Presence of right artificial knee joint: Secondary | ICD-10-CM | POA: Diagnosis not present

## 2019-12-01 DIAGNOSIS — T85698A Other mechanical complication of other specified internal prosthetic devices, implants and grafts, initial encounter: Secondary | ICD-10-CM | POA: Diagnosis not present

## 2019-12-03 DIAGNOSIS — T85698A Other mechanical complication of other specified internal prosthetic devices, implants and grafts, initial encounter: Secondary | ICD-10-CM | POA: Diagnosis not present

## 2019-12-03 DIAGNOSIS — Z96651 Presence of right artificial knee joint: Secondary | ICD-10-CM | POA: Diagnosis not present

## 2019-12-05 DIAGNOSIS — Z96651 Presence of right artificial knee joint: Secondary | ICD-10-CM | POA: Diagnosis not present

## 2019-12-05 DIAGNOSIS — T85698A Other mechanical complication of other specified internal prosthetic devices, implants and grafts, initial encounter: Secondary | ICD-10-CM | POA: Diagnosis not present

## 2019-12-10 ENCOUNTER — Other Ambulatory Visit: Payer: Self-pay | Admitting: *Deleted

## 2019-12-10 DIAGNOSIS — T85698A Other mechanical complication of other specified internal prosthetic devices, implants and grafts, initial encounter: Secondary | ICD-10-CM | POA: Diagnosis not present

## 2019-12-10 DIAGNOSIS — Z96651 Presence of right artificial knee joint: Secondary | ICD-10-CM | POA: Diagnosis not present

## 2019-12-10 NOTE — Patient Outreach (Signed)
[facility]) Care Management  12/10/2019  David Mathews Feb 24, 1941 [id number]   RN Health Coach attempted follow up outreach call to patient.  Patient was unavailable. HIPPA compliance voicemail message left with return callback number.  Plan: RN will call patient again within 30 days.  Gean Maidens BSN RN [facility] Care Management [phone]

## 2019-12-11 DIAGNOSIS — Z96651 Presence of right artificial knee joint: Secondary | ICD-10-CM | POA: Diagnosis not present

## 2019-12-11 DIAGNOSIS — T85698A Other mechanical complication of other specified internal prosthetic devices, implants and grafts, initial encounter: Secondary | ICD-10-CM | POA: Diagnosis not present

## 2019-12-16 DIAGNOSIS — Z96651 Presence of right artificial knee joint: Secondary | ICD-10-CM | POA: Diagnosis not present

## 2019-12-16 DIAGNOSIS — T85698A Other mechanical complication of other specified internal prosthetic devices, implants and grafts, initial encounter: Secondary | ICD-10-CM | POA: Diagnosis not present

## 2019-12-16 DIAGNOSIS — Z5321 Procedure and treatment not carried out due to patient leaving prior to being seen by health care provider: Secondary | ICD-10-CM | POA: Diagnosis not present

## 2019-12-16 DIAGNOSIS — E162 Hypoglycemia, unspecified: Secondary | ICD-10-CM | POA: Diagnosis not present

## 2019-12-18 DIAGNOSIS — T85698A Other mechanical complication of other specified internal prosthetic devices, implants and grafts, initial encounter: Secondary | ICD-10-CM | POA: Diagnosis not present

## 2019-12-18 DIAGNOSIS — Z96651 Presence of right artificial knee joint: Secondary | ICD-10-CM | POA: Diagnosis not present

## 2019-12-23 DIAGNOSIS — L602 Onychogryphosis: Secondary | ICD-10-CM | POA: Diagnosis not present

## 2019-12-23 DIAGNOSIS — Z96651 Presence of right artificial knee joint: Secondary | ICD-10-CM | POA: Diagnosis not present

## 2019-12-23 DIAGNOSIS — M205X2 Other deformities of toe(s) (acquired), left foot: Secondary | ICD-10-CM | POA: Diagnosis not present

## 2019-12-23 DIAGNOSIS — T85698A Other mechanical complication of other specified internal prosthetic devices, implants and grafts, initial encounter: Secondary | ICD-10-CM | POA: Diagnosis not present

## 2019-12-23 DIAGNOSIS — E1351 Other specified diabetes mellitus with diabetic peripheral angiopathy without gangrene: Secondary | ICD-10-CM | POA: Diagnosis not present

## 2019-12-24 DIAGNOSIS — H353131 Nonexudative age-related macular degeneration, bilateral, early dry stage: Secondary | ICD-10-CM | POA: Diagnosis not present

## 2019-12-24 DIAGNOSIS — H47323 Drusen of optic disc, bilateral: Secondary | ICD-10-CM | POA: Diagnosis not present

## 2019-12-24 DIAGNOSIS — E119 Type 2 diabetes mellitus without complications: Secondary | ICD-10-CM | POA: Diagnosis not present

## 2019-12-24 DIAGNOSIS — H26493 Other secondary cataract, bilateral: Secondary | ICD-10-CM | POA: Diagnosis not present

## 2019-12-25 DIAGNOSIS — T85698A Other mechanical complication of other specified internal prosthetic devices, implants and grafts, initial encounter: Secondary | ICD-10-CM | POA: Diagnosis not present

## 2019-12-25 DIAGNOSIS — Z96651 Presence of right artificial knee joint: Secondary | ICD-10-CM | POA: Diagnosis not present

## 2020-01-07 DIAGNOSIS — Z96651 Presence of right artificial knee joint: Secondary | ICD-10-CM | POA: Diagnosis not present

## 2020-01-07 DIAGNOSIS — Z471 Aftercare following joint replacement surgery: Secondary | ICD-10-CM | POA: Diagnosis not present

## 2020-01-08 ENCOUNTER — Ambulatory Visit: Payer: Self-pay | Admitting: *Deleted

## 2020-01-19 ENCOUNTER — Other Ambulatory Visit: Payer: Self-pay | Admitting: *Deleted

## 2020-01-19 DIAGNOSIS — E1351 Other specified diabetes mellitus with diabetic peripheral angiopathy without gangrene: Secondary | ICD-10-CM | POA: Diagnosis not present

## 2020-01-19 DIAGNOSIS — R234 Changes in skin texture: Secondary | ICD-10-CM | POA: Diagnosis not present

## 2020-01-19 DIAGNOSIS — I739 Peripheral vascular disease, unspecified: Secondary | ICD-10-CM | POA: Diagnosis not present

## 2020-01-19 NOTE — Patient Outreach (Signed)
[facility]) Care Management  01/19/2020  David Mathews November 13, 1941 [id number]   RN Health Coach attempted follow up outreach call to patient.  Patient was unavailable. HIPPA compliance voicemail message left with return callback number.  Plan: RN will call patient again within 30 days.  Gean Maidens BSN RN [facility] Care Management [phone]

## 2020-01-20 ENCOUNTER — Other Ambulatory Visit: Payer: Self-pay | Admitting: *Deleted

## 2020-01-21 NOTE — Patient Outreach (Signed)
[facility]) Care Management  [place]  01/21/2020   David Mathews [birth year] [id number]  RN Health Coach received return telephone  call  From patient.  Hipaa compliance verified. Per patient his blood sugar today is 96. Patient stated that he has an average of one episode a week of hypoglycemia.  Patient stated that his vision gets blurry when he is hypoglycemic Patient stated he is not actually on a special diet. He just tries to eat healthy with what he has available. Per patient he had recent knee surgery and he uses a cane as needed,. Patient has not decided on getting the COVID vaccine. Per patient he doesn't wear his mask all the time because he doesn't feel he needs to. Patient has strong feelings about whether to wear a mask or not. Patient has agreed to follow up outreach calls.    Encounter Medications:  Outpatient Encounter Medications as of 01/20/2020  Medication Sig Note  . alfuzosin (UROXATRAL) 10 MG 24 hr tablet Take 10 mg by mouth daily.   [name] amitriptyline (ELAVIL) 10 MG tablet Take 10 mg by mouth at bedtime as needed for sleep.    . Cholecalciferol (VITAMIN D3) 1000 units CAPS Take 1,000 Units by mouth daily.   . Cyanocobalamin 2500 MCG CHEW Chew 2,500 mcg by mouth daily.   . cyclobenzaprine (FLEXERIL) 10 MG tablet Take 1 tablet (10 mg total) by mouth 2 (two) times daily as needed for muscle spasms.   [name] docusate sodium (COLACE) 100 MG capsule Take 1 capsule (100 mg total) by mouth 2 (two) times daily.   [name] doxycycline (VIBRAMYCIN) 50 MG capsule Take 1 capsule (50 mg total) by mouth 2 (two) times daily.   . famotidine (PEPCID) 40 MG tablet Take 40 mg by mouth daily.   . finasteride (PROSCAR) 5 MG tablet Take 5 mg by mouth every evening.    [name] HYDROcodone-acetaminophen (NORCO) 5-325 MG tablet Take 1-2 tablets by mouth every 6 (six) hours as needed for moderate pain or severe pain.   [name] insulin aspart (NOVOLOG FLEXPEN) 100 UNIT/ML FlexPen Inject 12-14  Units into the skin 3 (three) times daily with meals. PER SLIDING SCALE   . Insulin Glargine (LANTUS SOLOSTAR) 100 UNIT/ML Solostar Pen Inject 55 Units into the skin daily. Per sliding scale (Patient taking differently: Inject 22 Units into the skin daily before breakfast. ) 11/18/2019: 11 units sq  . levothyroxine (SYNTHROID, LEVOTHROID) 88 MCG tablet Take 88 mcg by mouth daily before breakfast.   . lisinopril-hydrochlorothiazide (PRINZIDE,ZESTORETIC) 20-25 MG per tablet Take 1 tablet by mouth every evening.    . Magnesium 250 MG TABS Take 250 mg by mouth daily.   . Menthol, Topical Analgesic, (BIOFREEZE EX) Apply 1 application topically daily as needed (pain).   . metFORMIN (GLUCOPHAGE) 1000 MG tablet Take 1,000 mg by mouth 2 (two) times daily with a meal.   . Multiple Vitamins-Minerals (MULTIVITAMIN PO) Take 1 tablet by mouth daily.   . Naphazoline HCl (CLEAR EYES OP) Place 1 drop into both eyes daily as needed (irritation).   . polyethylene glycol (MIRALAX / GLYCOLAX) 17 g packet Take 17 g by mouth 2 (two) times daily.   . Potassium 99 MG TABS Take 198 mg by mouth 2 (two) times daily.   . pregabalin (LYRICA) 225 MG capsule Take 225 mg by mouth 2 (two) times daily.   . sildenafil (VIAGRA) 100 MG tablet Take 100 mg by mouth daily as needed for erectile dysfunction.   [name]  simvastatin (ZOCOR) 20 MG tablet Take 20 mg by mouth every evening.     No facility-administered encounter medications on file as of 01/20/2020.    Functional Status:  In your present state of health, do you have any difficulty performing the following activities: 11/10/2019 09/12/2019  Hearing? N N  Vision? N N  Difficulty concentrating or making decisions? N N  Walking or climbing stairs? Y Y  Comment - patient uses a cane/ need knee surgery  Dressing or bathing? N -  Doing errands, shopping? N Y  Comment - uses a cane or holds onto cart at times  Preparing Food and eating ? - N  Using the Toilet? - N  In the past six  months, have you accidently leaked urine? - N  Do you have problems with loss of bowel control? - N  Managing your Medications? - N  Managing your Finances? - N  Housekeeping or managing your Housekeeping? - Y  Comment - family assists  Some recent data might be hidden    Fall/Depression Screening: Fall Risk  01/20/2020 09/12/2019 10/07/2018  Falls in the past year? 0 0 0  Comment - - Emmi Telephone Survey: data to providers prior to load  Number falls in past yr: 0 0 -  Injury with Fall? 0 0 -  Risk for fall due to : Impaired balance/gait;Impaired mobility Impaired balance/gait;Impaired mobility -  Follow up Falls evaluation completed Falls evaluation completed;Falls prevention discussed;Education provided -   [facility] 2/9 Scores 01/20/2020 09/12/2019 08/15/2019 08/02/2015  PHQ - 2 Score 0 0 0 0   THN CM Care Plan Problem One     Most Recent Value  Care Plan Problem One  Knowledge Deficit in Self Management of Diabetes  Role Documenting the Problem One  Health Coach  Care Plan for Problem One  Active  THN Long Term Goal   Patient will not have a decrease in blood sugar below 70 within the next 90 days  THN Long Term Goal Start Date  01/20/20  Interventions for Problem One Long Term Goal  RN discussed the episodes of hypoglycemia. Patient stated that this occurs once a week. RN discussed action plan or hypoglycemia. RN will follow up with further discussion  THN CM Short Term Goal #1   Patient will have a better understanding of healthy snacks to eat within the next 30 days  THN CM Short Term Goal #1 Start Date  01/20/20  Interventions for Short Term Goal #1  RN reiterates healthy eating. RN will follow up with further discussion  THN CM Short Term Goal #2   Patient will follow up Health Maintenance within the next 30 days  THN CM Short Term Goal #2 Start Date  01/20/20  Interventions for Short Term Goal #2  Rn discussed health maintenance. RN discussed the COVID vaccine and offered to assist  patient in scheduling. RN disccused eye care. RN will follow up with further discussion.      Assessment:  A1C 8.6 Fasting blood sugars are 96 Patient is having at least one episode of hypoglycemia a week Patient vision gets blurry when hypoglycemic Patient has snacks available with him at all time if this occurs Patient has not received COVID vaccine Patient does not wear a mask regularly for pandemic precautions Plan:  RN discussed the increase in patient A1C from 7.7 to 8.6 RN discussed COVID vaccine Rn sent patient information on the COVID vaccine RN discussed pandemic precautions RN discussed hypoglycemia and action plan  RN discussed health maintenance RN discussed healthy eating RN will follow up within the month of June  Gean Maidens BSN RN [facility] Care Management [phone]

## 2020-01-22 DIAGNOSIS — I739 Peripheral vascular disease, unspecified: Secondary | ICD-10-CM | POA: Diagnosis not present

## 2020-01-29 DIAGNOSIS — E039 Hypothyroidism, unspecified: Secondary | ICD-10-CM | POA: Diagnosis not present

## 2020-01-29 DIAGNOSIS — R635 Abnormal weight gain: Secondary | ICD-10-CM | POA: Diagnosis not present

## 2020-01-29 DIAGNOSIS — R0602 Shortness of breath: Secondary | ICD-10-CM | POA: Diagnosis not present

## 2020-01-29 DIAGNOSIS — E1140 Type 2 diabetes mellitus with diabetic neuropathy, unspecified: Secondary | ICD-10-CM | POA: Diagnosis not present

## 2020-01-29 DIAGNOSIS — R609 Edema, unspecified: Secondary | ICD-10-CM | POA: Diagnosis not present

## 2020-01-29 DIAGNOSIS — E1165 Type 2 diabetes mellitus with hyperglycemia: Secondary | ICD-10-CM | POA: Diagnosis not present

## 2020-02-18 DIAGNOSIS — E039 Hypothyroidism, unspecified: Secondary | ICD-10-CM | POA: Diagnosis not present

## 2020-02-18 DIAGNOSIS — E1165 Type 2 diabetes mellitus with hyperglycemia: Secondary | ICD-10-CM | POA: Diagnosis not present

## 2020-02-18 DIAGNOSIS — Z79899 Other long term (current) drug therapy: Secondary | ICD-10-CM | POA: Diagnosis not present

## 2020-02-18 DIAGNOSIS — I10 Essential (primary) hypertension: Secondary | ICD-10-CM | POA: Diagnosis not present

## 2020-02-18 DIAGNOSIS — E1140 Type 2 diabetes mellitus with diabetic neuropathy, unspecified: Secondary | ICD-10-CM | POA: Diagnosis not present

## 2020-02-19 ENCOUNTER — Ambulatory Visit: Payer: Medicare Other | Admitting: *Deleted

## 2020-02-24 DIAGNOSIS — E119 Type 2 diabetes mellitus without complications: Secondary | ICD-10-CM | POA: Diagnosis not present

## 2020-02-26 DIAGNOSIS — S32009K Unspecified fracture of unspecified lumbar vertebra, subsequent encounter for fracture with nonunion: Secondary | ICD-10-CM | POA: Diagnosis not present

## 2020-02-26 DIAGNOSIS — I10 Essential (primary) hypertension: Secondary | ICD-10-CM | POA: Diagnosis not present

## 2020-02-26 DIAGNOSIS — Z6829 Body mass index (BMI) 29.0-29.9, adult: Secondary | ICD-10-CM | POA: Diagnosis not present

## 2020-03-24 DIAGNOSIS — Z961 Presence of intraocular lens: Secondary | ICD-10-CM | POA: Diagnosis not present

## 2020-03-24 DIAGNOSIS — H26493 Other secondary cataract, bilateral: Secondary | ICD-10-CM | POA: Diagnosis not present

## 2020-03-24 DIAGNOSIS — H35363 Drusen (degenerative) of macula, bilateral: Secondary | ICD-10-CM | POA: Diagnosis not present

## 2020-03-24 DIAGNOSIS — H4321 Crystalline deposits in vitreous body, right eye: Secondary | ICD-10-CM | POA: Diagnosis not present

## 2020-04-02 DIAGNOSIS — M205X2 Other deformities of toe(s) (acquired), left foot: Secondary | ICD-10-CM | POA: Diagnosis not present

## 2020-04-02 DIAGNOSIS — E1351 Other specified diabetes mellitus with diabetic peripheral angiopathy without gangrene: Secondary | ICD-10-CM | POA: Diagnosis not present

## 2020-04-02 DIAGNOSIS — I739 Peripheral vascular disease, unspecified: Secondary | ICD-10-CM | POA: Diagnosis not present

## 2020-04-02 DIAGNOSIS — M205X1 Other deformities of toe(s) (acquired), right foot: Secondary | ICD-10-CM | POA: Diagnosis not present

## 2020-04-20 ENCOUNTER — Other Ambulatory Visit: Payer: Self-pay | Admitting: *Deleted

## 2020-04-20 NOTE — Patient Outreach (Signed)
[facility]) Care Management  04/20/2020  HARRY SHUCK 02-26-1941 [id number]   RN Health Coach attempted follow up outreach call to patient.  Patient was unavailable. HIPPA compliance voicemail message left with return callback number.  Plan: RN will call patient again within 30 days.  Gean Maidens BSN RN [facility] Care Management [phone]

## 2020-04-27 ENCOUNTER — Other Ambulatory Visit: Payer: Self-pay | Admitting: *Deleted

## 2020-04-27 NOTE — Patient Outreach (Signed)
[facility]) Care Management  [place]  04/27/2020   David Mathews [birth year] [id number]  RN Health Coach telephone call to patient.  Hipaa compliance verified. Per patient his fasting blood sugar is 123. His A1C is 7.3. He is not currently exercising. Patient stated that he is having problems with balance.Per patient he has not had any recent  falls Patient is wanting assistance with balance therapy. RN discussed physical therapy and aquatic aerobics. Patient  stated he is having a lot of back pain. Patient has received educational material.  He understands to limit the carbohydrates in his diet. Patient has agreed to follow up outreach calls.   Encounter Medications:  Outpatient Encounter Medications as of 04/27/2020  Medication Sig Note  . alfuzosin (UROXATRAL) 10 MG 24 hr tablet Take 10 mg by mouth daily.   [name] amitriptyline (ELAVIL) 10 MG tablet Take 10 mg by mouth at bedtime as needed for sleep.    . Cholecalciferol (VITAMIN D3) 1000 units CAPS Take 1,000 Units by mouth daily.   . Cyanocobalamin 2500 MCG CHEW Chew 2,500 mcg by mouth daily.   . cyclobenzaprine (FLEXERIL) 10 MG tablet Take 1 tablet (10 mg total) by mouth 2 (two) times daily as needed for muscle spasms.   [name] docusate sodium (COLACE) 100 MG capsule Take 1 capsule (100 mg total) by mouth 2 (two) times daily.   [name] doxycycline (VIBRAMYCIN) 50 MG capsule Take 1 capsule (50 mg total) by mouth 2 (two) times daily.   . famotidine (PEPCID) 40 MG tablet Take 40 mg by mouth daily.   . ferrous sulfate (FERROUSUL) 325 (65 FE) MG tablet Take 1 tablet (325 mg total) by mouth 3 (three) times daily with meals for 14 days.   . finasteride (PROSCAR) 5 MG tablet Take 5 mg by mouth every evening.    [name] HYDROcodone-acetaminophen (NORCO) 5-325 MG tablet Take 1-2 tablets by mouth every 6 (six) hours as needed for moderate pain or severe pain.   [name] insulin aspart (NOVOLOG FLEXPEN) 100 UNIT/ML FlexPen Inject 12-14 Units into  the skin 3 (three) times daily with meals. PER SLIDING SCALE   . Insulin Glargine (LANTUS SOLOSTAR) 100 UNIT/ML Solostar Pen Inject 55 Units into the skin daily. Per sliding scale (Patient taking differently: Inject 22 Units into the skin daily before breakfast. ) 11/18/2019: 11 units sq  . levothyroxine (SYNTHROID, LEVOTHROID) 88 MCG tablet Take 88 mcg by mouth daily before breakfast.   . lisinopril-hydrochlorothiazide (PRINZIDE,ZESTORETIC) 20-25 MG per tablet Take 1 tablet by mouth every evening.    . Magnesium 250 MG TABS Take 250 mg by mouth daily.   . Menthol, Topical Analgesic, (BIOFREEZE EX) Apply 1 application topically daily as needed (pain).   . metFORMIN (GLUCOPHAGE) 1000 MG tablet Take 1,000 mg by mouth 2 (two) times daily with a meal.   . Multiple Vitamins-Minerals (MULTIVITAMIN PO) Take 1 tablet by mouth daily.   . Naphazoline HCl (CLEAR EYES OP) Place 1 drop into both eyes daily as needed (irritation).   . polyethylene glycol (MIRALAX / GLYCOLAX) 17 g packet Take 17 g by mouth 2 (two) times daily.   . Potassium 99 MG TABS Take 198 mg by mouth 2 (two) times daily.   . pregabalin (LYRICA) 225 MG capsule Take 225 mg by mouth 2 (two) times daily.   . sildenafil (VIAGRA) 100 MG tablet Take 100 mg by mouth daily as needed for erectile dysfunction.   . simvastatin (ZOCOR) 20 MG tablet Take 20  mg by mouth every evening.     No facility-administered encounter medications on file as of 04/27/2020.    Functional Status:  In your present state of health, do you have any difficulty performing the following activities: 11/10/2019 09/12/2019  Hearing? N N  Vision? N N  Difficulty concentrating or making decisions? N N  Walking or climbing stairs? Y Y  Comment - patient uses a cane/ need knee surgery  Dressing or bathing? N -  Doing errands, shopping? N Y  Comment - uses a cane or holds onto cart at times  Preparing Food and eating ? - N  Using the Toilet? - N  In the past six months, have  you accidently leaked urine? - N  Do you have problems with loss of bowel control? - N  Managing your Medications? - N  Managing your Finances? - N  Housekeeping or managing your Housekeeping? - Y  Comment - family assists  Some recent data might be hidden    Fall/Depression Screening: Fall Risk  01/20/2020 09/12/2019 10/07/2018  Falls in the past year? 0 0 0  Comment - - Emmi Telephone Survey: data to providers prior to load  Number falls in past yr: 0 0 -  Injury with Fall? 0 0 -  Risk for fall due to : Impaired balance/gait;Impaired mobility Impaired balance/gait;Impaired mobility -  Follow up Falls evaluation completed Falls evaluation completed;Falls prevention discussed;Education provided -   [facility] 2/9 Scores 01/20/2020 09/12/2019 08/15/2019 08/02/2015  PHQ - 2 Score 0 0 0 0   THN CM Care Plan Problem One     Most Recent Value  Care Plan Problem One  Knowledge Deficit in Self Management of Diabetes  Role Documenting the Problem One  Health Coach  Care Plan for Problem One  Active  THN Long Term Goal   Patient will not have any falls within the next 90 days  THN Long Term Goal Start Date  04/27/20  Interventions for Problem One Long Term Goal  RN discussed fall pe=revention and patient stating he is having balance issues. Patient will contact [employer] and aquatic center for water aerobics and balance therapy. RN will follow up with patient for following through.  THN CM Short Term Goal #1   Patient will have a better understanding of healthy snacks to eat within the next 30 days  Interventions for Short Term Goal #1  RN reiterates with patiient about healthy eating. RN sent patient educational information on healthy meal planning. RN will follow up with further discussion.  THN CM Short Term Goal #2   Patient will follow up Health Maintenance within the next 30 days  Interventions for Short Term Goal #2  RN reiterates health maintenance. RN discussed the importance of eye care and receiving  vaccines. RN will follow up for further questions and assistance.      Assessment:  A1C 7.3 BS 123 Gait and mobility imbalance Patient is wanting balance therapy  Plan:  RN discussed healthy eating RN sent educational material on Planning Healthy Meals RN sent exercise program activity booklet Patient will look into [facility] [facility] aerobic classes Patient will contact Dr for Physical therapy on balance  RN will follow up outreach calls in the month of September RN routed update to physician  Gean Maidens BSN RN [facility] Care Management [phone]

## 2020-05-20 ENCOUNTER — Ambulatory Visit: Payer: Self-pay | Admitting: *Deleted

## 2020-06-03 DIAGNOSIS — E113291 Type 2 diabetes mellitus with mild nonproliferative diabetic retinopathy without macular edema, right eye: Secondary | ICD-10-CM | POA: Diagnosis not present

## 2020-06-03 DIAGNOSIS — H26493 Other secondary cataract, bilateral: Secondary | ICD-10-CM | POA: Diagnosis not present

## 2020-06-14 DIAGNOSIS — Z9181 History of falling: Secondary | ICD-10-CM | POA: Diagnosis not present

## 2020-06-14 DIAGNOSIS — Z1331 Encounter for screening for depression: Secondary | ICD-10-CM | POA: Diagnosis not present

## 2020-06-14 DIAGNOSIS — Z6831 Body mass index (BMI) 31.0-31.9, adult: Secondary | ICD-10-CM | POA: Diagnosis not present

## 2020-06-14 DIAGNOSIS — Z0000 Encounter for general adult medical examination without abnormal findings: Secondary | ICD-10-CM | POA: Diagnosis not present

## 2020-06-14 DIAGNOSIS — E785 Hyperlipidemia, unspecified: Secondary | ICD-10-CM | POA: Diagnosis not present

## 2020-06-14 DIAGNOSIS — Z23 Encounter for immunization: Secondary | ICD-10-CM | POA: Diagnosis not present

## 2020-06-21 DIAGNOSIS — H4321 Crystalline deposits in vitreous body, right eye: Secondary | ICD-10-CM | POA: Diagnosis not present

## 2020-06-21 DIAGNOSIS — H26492 Other secondary cataract, left eye: Secondary | ICD-10-CM | POA: Diagnosis not present

## 2020-06-21 DIAGNOSIS — H35363 Drusen (degenerative) of macula, bilateral: Secondary | ICD-10-CM | POA: Diagnosis not present

## 2020-06-21 DIAGNOSIS — Z961 Presence of intraocular lens: Secondary | ICD-10-CM | POA: Diagnosis not present

## 2020-06-30 ENCOUNTER — Other Ambulatory Visit: Payer: Self-pay | Admitting: Neurosurgery

## 2020-06-30 DIAGNOSIS — S32009A Unspecified fracture of unspecified lumbar vertebra, initial encounter for closed fracture: Secondary | ICD-10-CM

## 2020-06-30 DIAGNOSIS — M5412 Radiculopathy, cervical region: Secondary | ICD-10-CM

## 2020-06-30 DIAGNOSIS — S32009K Unspecified fracture of unspecified lumbar vertebra, subsequent encounter for fracture with nonunion: Secondary | ICD-10-CM | POA: Diagnosis not present

## 2020-07-01 DIAGNOSIS — E1140 Type 2 diabetes mellitus with diabetic neuropathy, unspecified: Secondary | ICD-10-CM | POA: Diagnosis not present

## 2020-07-01 DIAGNOSIS — E039 Hypothyroidism, unspecified: Secondary | ICD-10-CM | POA: Diagnosis not present

## 2020-07-01 DIAGNOSIS — M179 Osteoarthritis of knee, unspecified: Secondary | ICD-10-CM | POA: Diagnosis not present

## 2020-07-01 DIAGNOSIS — R609 Edema, unspecified: Secondary | ICD-10-CM | POA: Diagnosis not present

## 2020-07-01 DIAGNOSIS — M159 Polyosteoarthritis, unspecified: Secondary | ICD-10-CM | POA: Diagnosis not present

## 2020-07-01 DIAGNOSIS — Z79899 Other long term (current) drug therapy: Secondary | ICD-10-CM | POA: Diagnosis not present

## 2020-07-01 DIAGNOSIS — N529 Male erectile dysfunction, unspecified: Secondary | ICD-10-CM | POA: Diagnosis not present

## 2020-07-01 DIAGNOSIS — E1165 Type 2 diabetes mellitus with hyperglycemia: Secondary | ICD-10-CM | POA: Diagnosis not present

## 2020-07-01 DIAGNOSIS — M545 Low back pain: Secondary | ICD-10-CM | POA: Diagnosis not present

## 2020-07-01 DIAGNOSIS — E7800 Pure hypercholesterolemia, unspecified: Secondary | ICD-10-CM | POA: Diagnosis not present

## 2020-07-01 DIAGNOSIS — N401 Enlarged prostate with lower urinary tract symptoms: Secondary | ICD-10-CM | POA: Diagnosis not present

## 2020-07-01 DIAGNOSIS — I10 Essential (primary) hypertension: Secondary | ICD-10-CM | POA: Diagnosis not present

## 2020-07-14 DIAGNOSIS — Z96651 Presence of right artificial knee joint: Secondary | ICD-10-CM | POA: Diagnosis not present

## 2020-07-14 DIAGNOSIS — M25561 Pain in right knee: Secondary | ICD-10-CM | POA: Diagnosis not present

## 2020-07-21 ENCOUNTER — Other Ambulatory Visit: Payer: Self-pay | Admitting: *Deleted

## 2020-07-21 NOTE — Patient Outreach (Addendum)
[facility]) Care Management  [place]  07/21/2020   David Mathews June 18, 1941 [id number]  RN Health Coach telephone call to patient.  Hipaa compliance verified. Per patient he has fallen x 2 with not injury. Patient A1C is 8.1 elevated from 7.3. Per patient he has been under a lot of stress . [place] daughter died last month. Per patient he has been having back pain for a while. He is having a CT of back [id number]/ CT of neck and xray of neck 07/22/2020. Patient has not been vaccinated and usually does not wear a mask. Per patient he has not been able to exercise.  Encounter Medications:  Outpatient Encounter Medications as of 07/21/2020  Medication Sig Note  . alfuzosin (UROXATRAL) 10 MG 24 hr tablet Take 10 mg by mouth daily.   [name] amitriptyline (ELAVIL) 10 MG tablet Take 10 mg by mouth at bedtime as needed for sleep.    . Cholecalciferol (VITAMIN D3) 1000 units CAPS Take 1,000 Units by mouth daily.   . Cyanocobalamin 2500 MCG CHEW Chew 2,500 mcg by mouth daily.   . cyclobenzaprine (FLEXERIL) 10 MG tablet Take 1 tablet (10 mg total) by mouth 2 (two) times daily as needed for muscle spasms.   [name] docusate sodium (COLACE) 100 MG capsule Take 1 capsule (100 mg total) by mouth 2 (two) times daily.   [name] doxycycline (VIBRAMYCIN) 50 MG capsule Take 1 capsule (50 mg total) by mouth 2 (two) times daily.   . famotidine (PEPCID) 40 MG tablet Take 40 mg by mouth daily.   . ferrous sulfate (FERROUSUL) 325 (65 FE) MG tablet Take 1 tablet (325 mg total) by mouth 3 (three) times daily with meals for 14 days.   . finasteride (PROSCAR) 5 MG tablet Take 5 mg by mouth every evening.    [name] HYDROcodone-acetaminophen (NORCO) 5-325 MG tablet Take 1-2 tablets by mouth every 6 (six) hours as needed for moderate pain or severe pain.   [name] insulin aspart (NOVOLOG FLEXPEN) 100 UNIT/ML FlexPen Inject 12-14 Units into the skin 3 (three) times daily with meals. PER SLIDING SCALE   . Insulin Glargine  (LANTUS SOLOSTAR) 100 UNIT/ML Solostar Pen Inject 55 Units into the skin daily. Per sliding scale (Patient taking differently: Inject 22 Units into the skin daily before breakfast. ) 11/18/2019: 11 units sq  . levothyroxine (SYNTHROID, LEVOTHROID) 88 MCG tablet Take 88 mcg by mouth daily before breakfast.   . lisinopril-hydrochlorothiazide (PRINZIDE,ZESTORETIC) 20-25 MG per tablet Take 1 tablet by mouth every evening.    . Magnesium 250 MG TABS Take 250 mg by mouth daily.   . Menthol, Topical Analgesic, (BIOFREEZE EX) Apply 1 application topically daily as needed (pain).   . metFORMIN (GLUCOPHAGE) 1000 MG tablet Take 1,000 mg by mouth 2 (two) times daily with a meal.   . Multiple Vitamins-Minerals (MULTIVITAMIN PO) Take 1 tablet by mouth daily.   . Naphazoline HCl (CLEAR EYES OP) Place 1 drop into both eyes daily as needed (irritation).   . polyethylene glycol (MIRALAX / GLYCOLAX) 17 g packet Take 17 g by mouth 2 (two) times daily.   . Potassium 99 MG TABS Take 198 mg by mouth 2 (two) times daily.   . pregabalin (LYRICA) 225 MG capsule Take 225 mg by mouth 2 (two) times daily.   . sildenafil (VIAGRA) 100 MG tablet Take 100 mg by mouth daily as needed for erectile dysfunction.   . simvastatin (ZOCOR) 20 MG tablet Take 20 mg by mouth  every evening.     No facility-administered encounter medications on file as of 07/21/2020.    Functional Status:  In your present state of health, do you have any difficulty performing the following activities: 11/10/2019 09/12/2019  Hearing? N N  Vision? N N  Difficulty concentrating or making decisions? N N  Walking or climbing stairs? Y Y  Comment - patient uses a cane/ need knee surgery  Dressing or bathing? N -  Doing errands, shopping? N Y  Comment - uses a cane or holds onto cart at times  Preparing Food and eating ? - N  Using the Toilet? - N  In the past six months, have you accidently leaked urine? - N  Do you have problems with loss of bowel  control? - N  Managing your Medications? - N  Managing your Finances? - N  Housekeeping or managing your Housekeeping? - Y  Comment - family assists  Some recent data might be hidden    Fall/Depression Screening: Fall Risk  07/21/2020 01/20/2020 09/12/2019  Falls in the past year? 1 0 0  Comment - - -  Number falls in past yr: 1 0 0  Injury with Fall? 0 0 0  Risk for fall due to : History of fall(s);Impaired balance/gait;Impaired mobility Impaired balance/gait;Impaired mobility Impaired balance/gait;Impaired mobility  Follow up Falls evaluation completed Falls evaluation completed Falls evaluation completed;Falls prevention discussed;Education provided   [facility] 2/9 Scores 01/20/2020 09/12/2019 08/15/2019 08/02/2015  PHQ - 2 Score 0 0 0 0   Goals Addressed              This Visit's Progress   .  A1C will be less than 8.1 (pt-stated)        CARE PLAN ENTRY (see longtitudinal plan of care for additional care plan information)  Objective:  Lab Results  Component Value Date   HGBA1C 8.6 (H) 11/10/2019 .   Lab Results  Component Value Date   CREATININE 0.71 11/18/2019   CREATININE 0.60 (L) 11/18/2019   CREATININE 0.73 11/10/2019 .   [name] No results found for: EGFR  Current Barriers:  [name] Knowledge Deficits related to basic Diabetes pathophysiology and self care/management . Behavior modification  Case Manager Clinical Goal(s):  Over the next 90 days, patient will demonstrate improved adherence to prescribed treatment plan for diabetes self care/management as evidenced by:  [name] Verbalize daily monitoring and recording of CBG within 90 days . Verbalize adherence to ADA/ carb modified diet within the next 90 days . Verbalize adherence to prescribed medication regimen within the next 90 days   Interventions:  . Provided education to patient about basic DM disease process . Reviewed scheduled/upcoming provider appointments including: Xrays, and MRI . Advised patient, providing education  and rationale, to check cbg as per ordered and record, calling PCP for findings outside established parameters.    Patient Self Care Activities:  . Self administers oral medications as prescribed . Attends all scheduled provider appointments . Checks blood sugars as prescribed and utilize hyper and hypoglycemia protocol as needed . Adheres to prescribed ADA/carb modified  Initial goal documentation        Assessment:  A1C 8.1 increased from 7.3 Patient is having CT and Mri on [id number] Patient has been unable to exercise Patient has not been adhering to diet  Plan:  RN discussed healthy eating MRI of neck scheduled [id number] Ct back scheduled [id number] Xray of neck scheduled [id number] RN discussed A1C increase and goals RN will follow up within the  month of November   [place] [facility] Management [phone]

## 2020-07-22 ENCOUNTER — Ambulatory Visit
Admission: RE | Admit: 2020-07-22 | Discharge: 2020-07-22 | Disposition: A | Discharge status: Home / Self Care | Payer: Medicare Other | Source: Ambulatory Visit | Attending: Neurosurgery | Admitting: Neurosurgery

## 2020-07-22 ENCOUNTER — Other Ambulatory Visit: Payer: Self-pay | Admitting: Neurosurgery

## 2020-07-22 ENCOUNTER — Other Ambulatory Visit: Payer: Self-pay

## 2020-07-22 DIAGNOSIS — M5412 Radiculopathy, cervical region: Secondary | ICD-10-CM | POA: Diagnosis not present

## 2020-07-22 DIAGNOSIS — S32009K Unspecified fracture of unspecified lumbar vertebra, subsequent encounter for fracture with nonunion: Secondary | ICD-10-CM | POA: Diagnosis not present

## 2020-07-22 DIAGNOSIS — M4802 Spinal stenosis, cervical region: Secondary | ICD-10-CM | POA: Diagnosis not present

## 2020-07-22 DIAGNOSIS — S32009A Unspecified fracture of unspecified lumbar vertebra, initial encounter for closed fracture: Secondary | ICD-10-CM

## 2020-07-22 DIAGNOSIS — M545 Low back pain: Secondary | ICD-10-CM | POA: Diagnosis not present

## 2020-07-28 ENCOUNTER — Encounter (HOSPITAL_COMMUNITY): Payer: Self-pay

## 2020-07-28 ENCOUNTER — Other Ambulatory Visit: Payer: Self-pay

## 2020-07-28 ENCOUNTER — Other Ambulatory Visit (HOSPITAL_COMMUNITY)
Admission: RE | Admit: 2020-07-28 | Discharge: 2020-07-28 | Disposition: A | Discharge status: Home / Self Care | Payer: Medicare Other | Source: Ambulatory Visit | Attending: Neurosurgery | Admitting: Neurosurgery

## 2020-07-28 ENCOUNTER — Encounter (HOSPITAL_COMMUNITY)
Admission: RE | Admit: 2020-07-28 | Discharge: 2020-07-28 | Disposition: A | Discharge status: Home / Self Care | Payer: Medicare Other | Source: Ambulatory Visit | Attending: Neurosurgery | Admitting: Neurosurgery

## 2020-07-28 DIAGNOSIS — Z01812 Encounter for preprocedural laboratory examination: Secondary | ICD-10-CM | POA: Insufficient documentation

## 2020-07-28 DIAGNOSIS — I10 Essential (primary) hypertension: Secondary | ICD-10-CM | POA: Diagnosis not present

## 2020-07-28 DIAGNOSIS — R609 Edema, unspecified: Secondary | ICD-10-CM | POA: Diagnosis not present

## 2020-07-28 DIAGNOSIS — E1165 Type 2 diabetes mellitus with hyperglycemia: Secondary | ICD-10-CM | POA: Insufficient documentation

## 2020-07-28 DIAGNOSIS — Z20822 Contact with and (suspected) exposure to covid-19: Secondary | ICD-10-CM | POA: Insufficient documentation

## 2020-07-28 DIAGNOSIS — Z794 Long term (current) use of insulin: Secondary | ICD-10-CM | POA: Insufficient documentation

## 2020-07-28 LAB — CBC WITH DIFFERENTIAL/PLATELET
Abs Immature Granulocytes: 0.02 10*3/uL (ref 0.00–0.07)
Basophils Absolute: 0 10*3/uL (ref 0.0–0.1)
Basophils Relative: 0 %
Eosinophils Absolute: 0.1 10*3/uL (ref 0.0–0.5)
Eosinophils Relative: 2 %
HCT: 41.5 % (ref 39.0–52.0)
Hemoglobin: 13.5 g/dL (ref 13.0–17.0)
Immature Granulocytes: 0 %
Lymphocytes Relative: 15 %
Lymphs Abs: 1.1 10*3/uL (ref 0.7–4.0)
MCH: 29.9 pg (ref 26.0–34.0)
MCHC: 32.5 g/dL (ref 30.0–36.0)
MCV: 92 fL (ref 80.0–100.0)
Monocytes Absolute: 0.4 10*3/uL (ref 0.1–1.0)
Monocytes Relative: 6 %
Neutro Abs: 5.6 10*3/uL (ref 1.7–7.7)
Neutrophils Relative %: 77 %
Platelets: 228 10*3/uL (ref 150–400)
RBC: 4.51 MIL/uL (ref 4.22–5.81)
RDW: 12.6 % (ref 11.5–15.5)
WBC: 7.3 10*3/uL (ref 4.0–10.5)
nRBC: 0 % (ref 0.0–0.2)

## 2020-07-28 LAB — BASIC METABOLIC PANEL
Anion gap: 10 (ref 5–15)
BUN: 13 mg/dL (ref 8–23)
CO2: 30 mmol/L (ref 22–32)
Calcium: 9.6 mg/dL (ref 8.9–10.3)
Chloride: 93 mmol/L — ABNORMAL LOW (ref 98–111)
Creatinine, Ser: 0.9 mg/dL (ref 0.61–1.24)
GFR calc Af Amer: >60 mL/min (ref >60)
GFR calc non Af Amer: >60 mL/min (ref >60)
Glucose, Bld: 227 mg/dL — ABNORMAL HIGH (ref 70–99)
Potassium: 3.5 mmol/L (ref 3.5–5.1)
Sodium: 133 mmol/L — ABNORMAL LOW (ref 135–145)

## 2020-07-28 LAB — SURGICAL PCR SCREEN
MRSA, PCR: NEGATIVE
Staphylococcus aureus: POSITIVE — AB

## 2020-07-28 LAB — SARS CORONAVIRUS 2 (TAT 6-24 HRS): SARS Coronavirus 2: NEGATIVE

## 2020-07-28 LAB — TYPE AND SCREEN
ABO/RH(D): B POS
Antibody Screen: NEGATIVE

## 2020-07-28 LAB — GLUCOSE, CAPILLARY: Glucose-Capillary: 196 mg/dL — ABNORMAL HIGH (ref 70–99)

## 2020-07-28 NOTE — Progress Notes (Signed)
Your procedure is scheduled on Friday, September 10th.  Report to [facility] Main Entrance "A" at 7:45 A.M., and check in at the Admitting office.  Call this number if you have problems the morning of surgery:  [phone]  Call [phone] if you have any questions prior to your surgery date Monday-Friday 8am-4pm   Remember:  Do not eat or drink after midnight the night before your surgery    Take these medicines the morning of surgery with A SIP OF WATER  alfuzosin (UROXATRAL)  levothyroxine (SYNTHROID, LEVOTHROID)  pregabalin (LYRICA)   IF NEEDED: cyclobenzaprine (FLEXERIL),  HYDROcodone-acetaminophen (NORCO), Naphazoline HCl (CLEAR EYES OP)  Follow your surgeon's instructions on when to stop Aspirin.  If no instructions were given by your surgeon then you will need to call the office to get those instructions.    As of today, STOP taking celecoxib (CELEBREX), diclofenac Sodium (VOLTAREN),   Aleve, Naproxen, Ibuprofen, Motrin, Advil, Goody's, BC's, all herbal medications, fish oil, and all vitamins.          WHAT DO I DO ABOUT MY DIABETES MEDICATION?  ---The morning of surgery--- Take 11 units of Insulin Glargine (LANTUS SOLOSTAR)  . ONLY IF your CBG is greater than 220 mg/dL, you may take  of your sliding scale (correction) dose of insulin insulin aspart (NOVOLOG FLEXPEN).  HOW TO MANAGE YOUR DIABETES BEFORE AND AFTER SURGERY  Why is it important to control my blood sugar before and after surgery? . Improving blood sugar levels before and after surgery helps healing and can limit problems. . A way of improving blood sugar control is eating a healthy diet by: o  Eating less sugar and carbohydrates o  Increasing activity/exercise o  Talking with your doctor about reaching your blood sugar goals . High blood sugars (greater than 180 mg/dL) can raise your risk of infections and slow your recovery, so you will need to focus on controlling your diabetes during the weeks before  surgery. . Make sure that the doctor who takes care of your diabetes knows about your planned surgery including the date and location.  How do I manage my blood sugar before surgery? . Check your blood sugar at least 4 times a day, starting 2 days before surgery, to make sure that the level is not too high or low. . Check your blood sugar the morning of your surgery when you wake up and every 2 hours until you get to the Short Stay unit. o If your blood sugar is less than 70 mg/dL, you will need to treat for low blood sugar: - Do not take insulin. - Treat a low blood sugar (less than 70 mg/dL) with  cup of clear juice (cranberry or apple), 4 glucose tablets, OR glucose gel. - Recheck blood sugar in 15 minutes after treatment (to make sure it is greater than 70 mg/dL). If your blood sugar is not greater than 70 mg/dL on recheck, call [phone] for further instructions. . Report your blood sugar to the short stay nurse when you get to Short Stay.  . If you are admitted to the hospital after surgery: o Your blood sugar will be checked by the staff and you will probably be given insulin after surgery (instead of oral diabetes medicines) to make sure you have good blood sugar levels. o The goal for blood sugar control after surgery is 80-180 mg/dL.             Do not wear jewelry.  Do not wear lotions, powders, colognes, or deodorant.            Men may shave face and neck.            Do not bring valuables to the hospital.            [facility] is not responsible for any belongings or valuables.  Do NOT Smoke (Tobacco/Vaping) or drink Alcohol 24 hours prior to your procedure If you use a CPAP at night, you may bring all equipment for your overnight stay.   Contacts, glasses, dentures or bridgework may not be worn into surgery.      For patients admitted to the hospital, discharge time will be determined by your treatment team.   Patients discharged the day of surgery will not  be allowed to drive home, and someone needs to stay with them for 24 hours.  Special instructions:   [place]- Preparing For Surgery  Before surgery, you can play an important role. Because skin is not sterile, your skin needs to be as free of germs as possible. You can reduce the number of germs on your skin by washing with CHG (chlorahexidine gluconate) Soap before surgery.  CHG is an antiseptic cleaner which kills germs and bonds with the skin to continue killing germs even after washing.    Oral Hygiene is also important to reduce your risk of infection.  Remember - BRUSH YOUR TEETH THE MORNING OF SURGERY WITH YOUR REGULAR TOOTHPASTE  Please do not use if you have an allergy to CHG or antibacterial soaps. If your skin becomes reddened/irritated stop using the CHG.  Do not shave (including legs and underarms) for at least 48 hours prior to first CHG shower. It is OK to shave your face.  Please follow these instructions carefully.   1. Shower the NIGHT BEFORE SURGERY and the MORNING OF SURGERY with CHG Soap.   2. If you chose to wash your hair, wash your hair first as usual with your normal shampoo.  3. After you shampoo, rinse your hair and body thoroughly to remove the shampoo.  4. Use CHG as you would any other liquid soap. You can apply CHG directly to the skin and wash gently with a scrungie or a clean washcloth.   5. Apply the CHG Soap to your body ONLY FROM THE NECK DOWN.  Do not use on open wounds or open sores. Avoid contact with your eyes, ears, mouth and genitals (private parts). Wash Face and genitals (private parts)  with your normal soap.   6. Wash thoroughly, paying special attention to the area where your surgery will be performed.  7. Thoroughly rinse your body with warm water from the neck down.  8. DO NOT shower/wash with your normal soap after using and rinsing off the CHG Soap.  9. Pat yourself dry with a CLEAN TOWEL.  10. Wear CLEAN PAJAMAS to bed the night  before surgery  11. Place CLEAN SHEETS on your bed the night of your first shower and DO NOT SLEEP WITH PETS.  Day of Surgery: Wear Clean/Comfortable clothing the morning of surgery Do not apply any deodorants/lotions.   Remember to brush your teeth WITH YOUR REGULAR TOOTHPASTE.   Please read over the following fact sheets that you were given.

## 2020-07-28 NOTE — Progress Notes (Signed)
PCP - Lonie Peak, PA-C [facility]) Cardiologist - denies  PPM/ICD - denies  Chest x-ray - N/A EKG - 11/10/2019 Stress Test - deneis ECHO - 2016 Cardiac Cath - denies   Sleep Study - denies CPAP - N/A  Fasting Blood Sugar - 60 199 Checks Blood Sugar 1-3 times/day  Blood Thinner Instructions: N/A Aspirin Instructions: per patient last dose was on 07/23/2020  ERAS Protcol - No  COVID TEST- Scheduled for today 07/28/2020. Patient verbalized understanding of self-quarantine instructions, appointment time and place.  Anesthesia review: YES, records requested,  A1c requested, hx of cardiac study  Patient denies shortness of breath, fever, cough and chest pain at PAT appointment  All instructions explained to the patient, with a verbal understanding of the material. Patient agrees to go over the instructions while at home for a better understanding. Patient also instructed to self quarantine after being tested for COVID-19. The opportunity to ask questions was provided.

## 2020-07-29 NOTE — Anesthesia Preprocedure Evaluation (Addendum)
Anesthesia Evaluation  Patient identified by MRN, date of birth, ID band Patient awake    Reviewed: Allergy & Precautions, NPO status , Patient's Chart, lab work & pertinent test results  History of Anesthesia Complications Negative for: history of anesthetic complications  Airway Mallampati: II  TM Distance: >3 FB Neck ROM: Full    Dental  (+) Edentulous Upper, Edentulous Lower, Dental Advisory Given   Pulmonary neg pulmonary ROS, neg shortness of breath, neg recent URI, former smoker,  Covid-19 Nucleic Acid Test Results Lab Results      Component                Value               Date                      SARSCOV2NAA              NEGATIVE            07/28/2020                SARSCOV2NAA              NEGATIVE            11/15/2019              breath sounds clear to auscultation       Cardiovascular hypertension, Pt. on medications (-) angina(-) Past MI and (-) CHF  Rhythm:Regular  2016:  Left ventricle: The cavity size was normal. Wall thickness was  normal. Systolic function was normal. The estimated ejection  fraction was in the range of 55% to 60%. Wall motion was normal;  there were no regional wall motion abnormalities. Doppler  parameters are consistent with abnormal left ventricular  relaxation (grade 1 diastolic dysfunction).  - Aortic valve: Valve area (VTI): 1.82 cm^2. Valve area (Vmax):  1.79 cm^2. Valve area (Vmean): 1.64 cm^2.  - Mitral valve: Calcified annulus.  - Pericardium, extracardiac: A trivial pericardial effusion was  identified.    Neuro/Psych  Neuromuscular disease negative psych ROS   GI/Hepatic Neg liver ROS, GERD  Medicated and Controlled,  Endo/Other  diabetes, Type 2, Insulin DependentHypothyroidism   Renal/GU Lab Results      Component                Value               Date                      CREATININE               0.90                07/28/2020                 Musculoskeletal  (+) Arthritis ,   Abdominal   Peds  Hematology Lab Results      Component                Value               Date                      WBC                      7.3  07/28/2020                HGB                      13.5                07/28/2020                HCT                      41.5                07/28/2020                MCV                      92.0                07/28/2020                PLT                      228                 07/28/2020              Anesthesia Other Findings   Reproductive/Obstetrics                            Anesthesia Physical Anesthesia Plan  ASA: II  Anesthesia Plan: General   Post-op Pain Management:    Induction: Intravenous  PONV Risk Score and Plan: 2 and Ondansetron and Dexamethasone  Airway Management Planned: Oral ETT  Additional Equipment: None  Intra-op Plan:   Post-operative Plan: Extubation in OR  Informed Consent: I have reviewed the patients History and Physical, chart, labs and discussed the procedure including the risks, benefits and alternatives for the proposed anesthesia with the patient or authorized representative who has indicated his/her understanding and acceptance.     Dental advisory given  Plan Discussed with: CRNA and Surgeon  Anesthesia Plan Comments: (PAT note by Antionette Poles, PA-C: History of poorly controlled IDDM 2.  Last A1c from PCP was 8.1 on 07/01/2020.  Hypertension treated with lisinopril HCTZ.  He also has chronic peripheral edema h treated with HCTZ and as needed furosemide.  No history of heart disease.  No chest pain or shortness of breath.  History of echocardiogram done in 2016 during admission in setting of septic arthritis.  Echo showed EF 55 to 60%, normal wall motion, grade 1 diastolic function, mildly calcified aortic valve with mildly elevated velocity of 2.2 m/s suggestive of mild AS but visually the valve opened  well.  Preop labs reviewed, glucose elevated 227 consistent with poorly controlled insulin-dependent diabetes 2.  Labs otherwise unremarkable.  EKG 11/10/2019:Sinus rhythm with Premature supraventricular complexes.  Rate 68.  TTE 06/18/2015: - Left ventricle: The cavity size was normal. Wall thickness was  normal. Systolic function was normal. The estimated ejection  fraction was in the range of 55% to 60%. Wall motion was normal;  there were no regional wall motion abnormalities. Doppler  parameters are consistent with abnormal left ventricular  relaxation (grade 1 diastolic dysfunction).  - Aortic valve: Valve area (VTI): 1.82 cm^2. Valve area (Vmax):  1.79 cm^2. Valve area (Vmean): 1.64 cm^2.  - Mitral valve: Calcified annulus.  - Pericardium, extracardiac: A  trivial pericardial effusion was  identified.   Impressions:   - Normal LV function; grade 1 diastolic dysfunction; mildly  calcified aortic valve; mildly elevated velocity of 2.2 m/s  suggests mild AS but visually valve opens well.  )       Anesthesia Quick Evaluation

## 2020-07-29 NOTE — Progress Notes (Signed)
Anesthesia Chart Review:  History of poorly controlled IDDM 2.  Last A1c from PCP was 8.1 on 07/01/2020.  Hypertension treated with lisinopril HCTZ.  He also has chronic peripheral edema h treated with HCTZ and as needed furosemide.  No history of heart disease.  No chest pain or shortness of breath.  History of echocardiogram done in 2016 during admission in setting of septic arthritis.  Echo showed EF 55 to 60%, normal wall motion, grade 1 diastolic function, mildly calcified aortic valve with mildly elevated velocity of 2.2 m/s suggestive of mild AS but visually the valve opened well.  Preop labs reviewed, glucose elevated 227 consistent with poorly controlled insulin-dependent diabetes 2.  Labs otherwise unremarkable.  EKG 11/10/2019:Sinus rhythm with Premature supraventricular complexes.  Rate 68.  TTE 06/18/2015: - Left ventricle: The cavity size was normal. Wall thickness was  normal. Systolic function was normal. The estimated ejection  fraction was in the range of 55% to 60%. Wall motion was normal;  there were no regional wall motion abnormalities. Doppler  parameters are consistent with abnormal left ventricular  relaxation (grade 1 diastolic dysfunction).  - Aortic valve: Valve area (VTI): 1.82 cm^2. Valve area (Vmax):  1.79 cm^2. Valve area (Vmean): 1.64 cm^2.  - Mitral valve: Calcified annulus.  - Pericardium, extracardiac: A trivial pericardial effusion was  identified.   Impressions:   - Normal LV function; grade 1 diastolic dysfunction; mildly  calcified aortic valve; mildly elevated velocity of 2.2 m/s  suggests mild AS but visually valve opens well.    David Mathews [facility] Short Stay Center/Anesthesiology Phone [phone] 07/29/2020 11:20 AM

## 2020-07-30 ENCOUNTER — Encounter (HOSPITAL_COMMUNITY)
Admission: RE | Disposition: A | Discharge status: Home / Self Care | Payer: Self-pay | Source: Home / Self Care | Attending: Neurosurgery

## 2020-07-30 ENCOUNTER — Other Ambulatory Visit: Payer: Self-pay

## 2020-07-30 ENCOUNTER — Ambulatory Visit (HOSPITAL_COMMUNITY): Payer: Medicare Other

## 2020-07-30 ENCOUNTER — Ambulatory Visit (HOSPITAL_COMMUNITY): Payer: Medicare Other | Admitting: Physician Assistant

## 2020-07-30 ENCOUNTER — Encounter (HOSPITAL_COMMUNITY): Payer: Self-pay | Admitting: Neurosurgery

## 2020-07-30 ENCOUNTER — Observation Stay (HOSPITAL_COMMUNITY)
Admission: RE | Admit: 2020-07-30 | Discharge: 2020-07-30 | Disposition: A | Discharge status: Home / Self Care | Payer: Medicare Other | Attending: Neurosurgery | Admitting: Neurosurgery

## 2020-07-30 DIAGNOSIS — M4326 Fusion of spine, lumbar region: Secondary | ICD-10-CM | POA: Diagnosis not present

## 2020-07-30 DIAGNOSIS — I10 Essential (primary) hypertension: Secondary | ICD-10-CM | POA: Insufficient documentation

## 2020-07-30 DIAGNOSIS — S32038A Other fracture of third lumbar vertebra, initial encounter for closed fracture: Secondary | ICD-10-CM | POA: Diagnosis not present

## 2020-07-30 DIAGNOSIS — Z981 Arthrodesis status: Secondary | ICD-10-CM | POA: Diagnosis not present

## 2020-07-30 DIAGNOSIS — Z79899 Other long term (current) drug therapy: Secondary | ICD-10-CM | POA: Insufficient documentation

## 2020-07-30 DIAGNOSIS — E119 Type 2 diabetes mellitus without complications: Secondary | ICD-10-CM | POA: Insufficient documentation

## 2020-07-30 DIAGNOSIS — M960 Pseudarthrosis after fusion or arthrodesis: Principal | ICD-10-CM | POA: Insufficient documentation

## 2020-07-30 DIAGNOSIS — M545 Low back pain: Secondary | ICD-10-CM | POA: Diagnosis present

## 2020-07-30 DIAGNOSIS — E039 Hypothyroidism, unspecified: Secondary | ICD-10-CM | POA: Insufficient documentation

## 2020-07-30 DIAGNOSIS — Z794 Long term (current) use of insulin: Secondary | ICD-10-CM | POA: Insufficient documentation

## 2020-07-30 DIAGNOSIS — Z7982 Long term (current) use of aspirin: Secondary | ICD-10-CM | POA: Insufficient documentation

## 2020-07-30 DIAGNOSIS — E1110 Type 2 diabetes mellitus with ketoacidosis without coma: Secondary | ICD-10-CM | POA: Diagnosis not present

## 2020-07-30 DIAGNOSIS — M532X6 Spinal instabilities, lumbar region: Secondary | ICD-10-CM | POA: Diagnosis not present

## 2020-07-30 DIAGNOSIS — Z96651 Presence of right artificial knee joint: Secondary | ICD-10-CM | POA: Insufficient documentation

## 2020-07-30 DIAGNOSIS — S32009K Unspecified fracture of unspecified lumbar vertebra, subsequent encounter for fracture with nonunion: Secondary | ICD-10-CM | POA: Diagnosis present

## 2020-07-30 DIAGNOSIS — Z419 Encounter for procedure for purposes other than remedying health state, unspecified: Secondary | ICD-10-CM

## 2020-07-30 HISTORY — PX: LAMINECTOMY WITH POSTERIOR LATERAL ARTHRODESIS LEVEL 3: SHX6337

## 2020-07-30 LAB — GLUCOSE, CAPILLARY
Glucose-Capillary: 131 mg/dL — ABNORMAL HIGH (ref 70–99)
Glucose-Capillary: 169 mg/dL — ABNORMAL HIGH (ref 70–99)
Glucose-Capillary: 82 mg/dL (ref 70–99)

## 2020-07-30 LAB — HEMOGLOBIN A1C
Hgb A1c MFr Bld: 8.7 % — ABNORMAL HIGH (ref 4.8–5.6)
Mean Plasma Glucose: 202.99 mg/dL

## 2020-07-30 SURGERY — LAMINECTOMY WITH POSTERIOR LATERAL ARTHRODESIS LEVEL 3
Anesthesia: General | Site: Spine Lumbar

## 2020-07-30 MED ORDER — METFORMIN HCL 500 MG PO TABS: 1000.0000 mg | ORAL_TABLET | Freq: Two times a day (BID) | ORAL | Status: DC

## 2020-07-30 MED ORDER — INSULIN ASPART 100 UNIT/ML ~~LOC~~ SOLN: 0.0000 [IU] | Freq: Three times a day (TID) | SUBCUTANEOUS | Status: DC

## 2020-07-30 MED ORDER — PROPOFOL 10 MG/ML IV BOLUS
INTRAVENOUS | Status: DC | PRN
  Administered 2020-07-30: 10 mg via INTRAVENOUS
  Administered 2020-07-30: 80 mg via INTRAVENOUS
  Administered 2020-07-30: 30 mg via INTRAVENOUS
  Administered 2020-07-30 (×4): 20 mg via INTRAVENOUS

## 2020-07-30 MED ORDER — CHLORHEXIDINE GLUCONATE CLOTH 2 % EX PADS: 6.0000 | MEDICATED_PAD | Freq: Once | CUTANEOUS | Status: DC

## 2020-07-30 MED ORDER — LEVOTHYROXINE SODIUM 88 MCG PO TABS
88.0000 ug | ORAL_TABLET | Freq: Every day | ORAL | Status: DC
  Administered 2020-07-30: 88 ug via ORAL
  Filled 2020-07-30 (×2): qty 1

## 2020-07-30 MED ORDER — ACETAMINOPHEN 160 MG/5ML PO SOLN: 1000.0000 mg | Freq: Once | ORAL | Status: DC | PRN

## 2020-07-30 MED ORDER — ORAL CARE MOUTH RINSE: 15.0000 mL | Freq: Once | OROMUCOSAL | Status: AC

## 2020-07-30 MED ORDER — DIAZEPAM 5 MG PO TABS: 5.0000 mg | ORAL_TABLET | Freq: Four times a day (QID) | ORAL | Status: DC | PRN

## 2020-07-30 MED ORDER — FENTANYL CITRATE (PF) 250 MCG/5ML IJ SOLN
INTRAMUSCULAR | Status: DC | PRN
Start: 2020-07-30 — End: 2020-07-30
  Administered 2020-07-30 (×5): 50 ug via INTRAVENOUS

## 2020-07-30 MED ORDER — GLYCOPYRROLATE PF 0.2 MG/ML IJ SOSY
PREFILLED_SYRINGE | INTRAMUSCULAR | Status: AC
  Filled 2020-07-30: qty 1

## 2020-07-30 MED ORDER — FENTANYL CITRATE (PF) 250 MCG/5ML IJ SOLN
INTRAMUSCULAR | Status: AC
  Filled 2020-07-30: qty 5

## 2020-07-30 MED ORDER — VANCOMYCIN HCL 1000 MG IV SOLR
INTRAVENOUS | Status: DC | PRN
  Administered 2020-07-30: 1000 mg via TOPICAL

## 2020-07-30 MED ORDER — DEXAMETHASONE SODIUM PHOSPHATE 10 MG/ML IJ SOLN
INTRAMUSCULAR | Status: AC
  Filled 2020-07-30: qty 1

## 2020-07-30 MED ORDER — PREGABALIN 75 MG PO CAPS: 225.0000 mg | ORAL_CAPSULE | Freq: Every day | ORAL | Status: DC

## 2020-07-30 MED ORDER — FAMOTIDINE 20 MG PO TABS: 40.0000 mg | ORAL_TABLET | Freq: Every day | ORAL | Status: DC

## 2020-07-30 MED ORDER — ONDANSETRON HCL 4 MG/2ML IJ SOLN
INTRAMUSCULAR | Status: DC | PRN
  Administered 2020-07-30: 4 mg via INTRAVENOUS

## 2020-07-30 MED ORDER — ASPIRIN EC 81 MG PO TBEC: 81.0000 mg | DELAYED_RELEASE_TABLET | Freq: Every day | ORAL | Status: DC

## 2020-07-30 MED ORDER — LACTATED RINGERS IV SOLN: INTRAVENOUS | Status: DC

## 2020-07-30 MED ORDER — ROCURONIUM BROMIDE 10 MG/ML (PF) SYRINGE
PREFILLED_SYRINGE | INTRAVENOUS | Status: AC
  Filled 2020-07-30: qty 10

## 2020-07-30 MED ORDER — SODIUM CHLORIDE 0.9% FLUSH
3.0000 mL | Freq: Two times a day (BID) | INTRAVENOUS | Status: DC
  Administered 2020-07-30: 3 mL via INTRAVENOUS

## 2020-07-30 MED ORDER — THROMBIN 20000 UNITS EX SOLR
CUTANEOUS | Status: AC
  Filled 2020-07-30: qty 20000

## 2020-07-30 MED ORDER — ADULT MULTIVITAMIN W/MINERALS CH: 1.0000 | ORAL_TABLET | Freq: Every day | ORAL | Status: DC

## 2020-07-30 MED ORDER — SIMVASTATIN 20 MG PO TABS: 20.0000 mg | ORAL_TABLET | Freq: Every evening | ORAL | Status: DC

## 2020-07-30 MED ORDER — THROMBIN 20000 UNITS EX SOLR
CUTANEOUS | Status: DC | PRN
  Administered 2020-07-30: 20 mL via TOPICAL

## 2020-07-30 MED ORDER — CELECOXIB 200 MG PO CAPS
200.0000 mg | ORAL_CAPSULE | Freq: Two times a day (BID) | ORAL | Status: DC
  Administered 2020-07-30: 200 mg via ORAL
  Filled 2020-07-30: qty 1

## 2020-07-30 MED ORDER — OXYCODONE HCL 5 MG/5ML PO SOLN: 5.0000 mg | Freq: Once | ORAL | Status: DC | PRN

## 2020-07-30 MED ORDER — CYCLOBENZAPRINE HCL 10 MG PO TABS: 10.0000 mg | ORAL_TABLET | Freq: Two times a day (BID) | ORAL | Status: DC | PRN

## 2020-07-30 MED ORDER — SODIUM CHLORIDE 0.9% FLUSH: 3.0000 mL | INTRAVENOUS | Status: DC | PRN

## 2020-07-30 MED ORDER — INSULIN ASPART 100 UNIT/ML FLEXPEN: 10.0000 [IU] | PEN_INJECTOR | Freq: Three times a day (TID) | SUBCUTANEOUS | Status: DC

## 2020-07-30 MED ORDER — CEFAZOLIN SODIUM-DEXTROSE 1-4 GM/50ML-% IV SOLN: 1.0000 g | Freq: Three times a day (TID) | INTRAVENOUS | Status: DC

## 2020-07-30 MED ORDER — ACETAMINOPHEN 325 MG PO TABS: 650.0000 mg | ORAL_TABLET | ORAL | Status: DC | PRN

## 2020-07-30 MED ORDER — SODIUM CHLORIDE 0.9 % IV SOLN
250.0000 mL | INTRAVENOUS | Status: DC
  Administered 2020-07-30: 250 mL via INTRAVENOUS

## 2020-07-30 MED ORDER — VANCOMYCIN HCL IN DEXTROSE 1-5 GM/200ML-% IV SOLN
1000.0000 mg | INTRAVENOUS | Status: AC
  Administered 2020-07-30: 1000 mg via INTRAVENOUS
  Filled 2020-07-30: qty 200

## 2020-07-30 MED ORDER — HYDROCHLOROTHIAZIDE 25 MG PO TABS
25.0000 mg | ORAL_TABLET | Freq: Every day | ORAL | Status: DC
  Administered 2020-07-30: 25 mg via ORAL
  Filled 2020-07-30: qty 1

## 2020-07-30 MED ORDER — POLYETHYLENE GLYCOL 3350 17 G PO PACK: 17.0000 g | PACK | Freq: Every day | ORAL | Status: DC | PRN

## 2020-07-30 MED ORDER — OXYCODONE HCL 5 MG PO TABS: 10.0000 mg | ORAL_TABLET | ORAL | Status: DC | PRN

## 2020-07-30 MED ORDER — ONDANSETRON HCL 4 MG PO TABS: 4.0000 mg | ORAL_TABLET | Freq: Four times a day (QID) | ORAL | Status: DC | PRN

## 2020-07-30 MED ORDER — ROCURONIUM BROMIDE 10 MG/ML (PF) SYRINGE
PREFILLED_SYRINGE | INTRAVENOUS | Status: DC | PRN
  Administered 2020-07-30: 20 mg via INTRAVENOUS
  Administered 2020-07-30: 60 mg via INTRAVENOUS

## 2020-07-30 MED ORDER — SUGAMMADEX SODIUM 200 MG/2ML IV SOLN
INTRAVENOUS | Status: DC | PRN
  Administered 2020-07-30: 200 mg via INTRAVENOUS

## 2020-07-30 MED ORDER — PROPOFOL 10 MG/ML IV BOLUS
INTRAVENOUS | Status: AC
  Filled 2020-07-30: qty 20

## 2020-07-30 MED ORDER — FENTANYL CITRATE (PF) 100 MCG/2ML IJ SOLN: 25.0000 ug | INTRAMUSCULAR | Status: DC | PRN

## 2020-07-30 MED ORDER — BUPIVACAINE HCL (PF) 0.25 % IJ SOLN
INTRAMUSCULAR | Status: DC | PRN
  Administered 2020-07-30: 25 mL

## 2020-07-30 MED ORDER — ONDANSETRON HCL 4 MG/2ML IJ SOLN: 4.0000 mg | Freq: Four times a day (QID) | INTRAMUSCULAR | Status: DC | PRN

## 2020-07-30 MED ORDER — HYDROCODONE-ACETAMINOPHEN 10-325 MG PO TABS: 1.0000 | ORAL_TABLET | ORAL | Status: DC | PRN

## 2020-07-30 MED ORDER — ACETAMINOPHEN 10 MG/ML IV SOLN: 1000.0000 mg | Freq: Once | INTRAVENOUS | Status: DC | PRN

## 2020-07-30 MED ORDER — VANCOMYCIN HCL 1000 MG IV SOLR
INTRAVENOUS | Status: AC
  Filled 2020-07-30: qty 1000

## 2020-07-30 MED ORDER — SODIUM CHLORIDE 0.9 % IV SOLN
INTRAVENOUS | Status: DC | PRN
  Administered 2020-07-30: 500 mL

## 2020-07-30 MED ORDER — PHENYLEPHRINE HCL-NACL 10-0.9 MG/250ML-% IV SOLN
INTRAVENOUS | Status: DC | PRN
  Administered 2020-07-30: 10 ug/min via INTRAVENOUS

## 2020-07-30 MED ORDER — BUPIVACAINE HCL (PF) 0.25 % IJ SOLN
INTRAMUSCULAR | Status: AC
  Filled 2020-07-30: qty 30

## 2020-07-30 MED ORDER — 0.9 % SODIUM CHLORIDE (POUR BTL) OPTIME
TOPICAL | Status: DC | PRN
  Administered 2020-07-30: 1000 mL

## 2020-07-30 MED ORDER — ONDANSETRON HCL 4 MG/2ML IJ SOLN
INTRAMUSCULAR | Status: AC
  Filled 2020-07-30: qty 2

## 2020-07-30 MED ORDER — ACETAMINOPHEN 500 MG PO TABS: 1000.0000 mg | ORAL_TABLET | Freq: Once | ORAL | Status: DC | PRN

## 2020-07-30 MED ORDER — ACETAMINOPHEN 650 MG RE SUPP: 650.0000 mg | RECTAL | Status: DC | PRN

## 2020-07-30 MED ORDER — GLYCOPYRROLATE PF 0.2 MG/ML IJ SOSY
PREFILLED_SYRINGE | INTRAMUSCULAR | Status: DC | PRN
  Administered 2020-07-30 (×2): .1 mg via INTRAVENOUS

## 2020-07-30 MED ORDER — BISACODYL 10 MG RE SUPP: 10.0000 mg | Freq: Every day | RECTAL | Status: DC | PRN

## 2020-07-30 MED ORDER — HYDROMORPHONE HCL 1 MG/ML IJ SOLN: 1.0000 mg | INTRAMUSCULAR | Status: DC | PRN

## 2020-07-30 MED ORDER — LIDOCAINE 2% (20 MG/ML) 5 ML SYRINGE
INTRAMUSCULAR | Status: AC
  Filled 2020-07-30: qty 5

## 2020-07-30 MED ORDER — INSULIN ASPART 100 UNIT/ML ~~LOC~~ SOLN: 0.0000 [IU] | Freq: Every day | SUBCUTANEOUS | Status: DC

## 2020-07-30 MED ORDER — POTASSIUM 99 MG PO TABS: 198.0000 mg | ORAL_TABLET | Freq: Two times a day (BID) | ORAL | Status: DC

## 2020-07-30 MED ORDER — LISINOPRIL 20 MG PO TABS
20.0000 mg | ORAL_TABLET | Freq: Every day | ORAL | Status: DC
  Administered 2020-07-30: 20 mg via ORAL
  Filled 2020-07-30: qty 1

## 2020-07-30 MED ORDER — MENTHOL 3 MG MT LOZG
1.0000 | LOZENGE | OROMUCOSAL | Status: DC | PRN
  Filled 2020-07-30: qty 9

## 2020-07-30 MED ORDER — FLEET ENEMA 7-19 GM/118ML RE ENEM: 1.0000 | ENEMA | Freq: Once | RECTAL | Status: DC | PRN

## 2020-07-30 MED ORDER — FERROUS SULFATE 325 (65 FE) MG PO TABS: 325.0000 mg | ORAL_TABLET | Freq: Three times a day (TID) | ORAL | Status: DC

## 2020-07-30 MED ORDER — LISINOPRIL-HYDROCHLOROTHIAZIDE 20-25 MG PO TABS: 1.0000 | ORAL_TABLET | Freq: Every day | ORAL | Status: DC

## 2020-07-30 MED ORDER — FINASTERIDE 5 MG PO TABS: 5.0000 mg | ORAL_TABLET | Freq: Every evening | ORAL | Status: DC

## 2020-07-30 MED ORDER — ALFUZOSIN HCL ER 10 MG PO TB24: 10.0000 mg | ORAL_TABLET | Freq: Every day | ORAL | Status: DC

## 2020-07-30 MED ORDER — INSULIN GLARGINE 100 UNIT/ML ~~LOC~~ SOLN
22.0000 [IU] | Freq: Every day | SUBCUTANEOUS | Status: DC
  Filled 2020-07-30: qty 0.22

## 2020-07-30 MED ORDER — FUROSEMIDE 20 MG PO TABS
20.0000 mg | ORAL_TABLET | Freq: Every day | ORAL | Status: DC
  Administered 2020-07-30: 20 mg via ORAL
  Filled 2020-07-30: qty 1

## 2020-07-30 MED ORDER — DEXAMETHASONE SODIUM PHOSPHATE 10 MG/ML IJ SOLN
10.0000 mg | Freq: Once | INTRAMUSCULAR | Status: AC
  Administered 2020-07-30: 10 mg via INTRAVENOUS

## 2020-07-30 MED ORDER — OXYCODONE HCL 5 MG PO TABS: 5.0000 mg | ORAL_TABLET | Freq: Once | ORAL | Status: DC | PRN

## 2020-07-30 MED ORDER — PHENOL 1.4 % MT LIQD: 1.0000 | OROMUCOSAL | Status: DC | PRN

## 2020-07-30 MED ORDER — CHLORHEXIDINE GLUCONATE 0.12 % MT SOLN
15.0000 mL | Freq: Once | OROMUCOSAL | Status: AC
  Administered 2020-07-30: 15 mL via OROMUCOSAL
  Filled 2020-07-30: qty 15

## 2020-07-30 MED ORDER — PREGABALIN 75 MG PO CAPS
150.0000 mg | ORAL_CAPSULE | Freq: Every day | ORAL | Status: DC
  Administered 2020-07-30: 150 mg via ORAL
  Filled 2020-07-30: qty 2

## 2020-07-30 SURGICAL SUPPLY — 64 items
BAG DECANTER FOR FLEXI CONT (MISCELLANEOUS) ×3 IMPLANT
BENZOIN TINCTURE PRP APPL 2/3 (GAUZE/BANDAGES/DRESSINGS) ×3 IMPLANT
BLADE CLIPPER SURG (BLADE) ×3 IMPLANT
BUR CUTTER 7.0 ROUND (BURR) IMPLANT
BUR MATCHSTICK NEURO 3.0 LAGG (BURR) ×3 IMPLANT
CANISTER SUCT 3000ML PPV (MISCELLANEOUS) ×3 IMPLANT
CARTRIDGE OIL MAESTRO DRILL (MISCELLANEOUS) ×1 IMPLANT
CLOSURE WOUND 1/2 X4 (GAUZE/BANDAGES/DRESSINGS) ×2
CNTNR URN SCR LID CUP LEK RST (MISCELLANEOUS) ×1 IMPLANT
CONNECTOR LAT CLSD 5.5X90 (Rod) ×12 IMPLANT
CONT SPEC 4OZ STRL OR WHT (MISCELLANEOUS) ×2
COVER BACK TABLE 60X90IN (DRAPES) ×3 IMPLANT
COVER WAND RF STERILE (DRAPES) IMPLANT
DECANTER SPIKE VIAL GLASS SM (MISCELLANEOUS) IMPLANT
DERMABOND ADVANCED (GAUZE/BANDAGES/DRESSINGS) ×2
DERMABOND ADVANCED .7 DNX12 (GAUZE/BANDAGES/DRESSINGS) ×1 IMPLANT
DIFFUSER DRILL AIR PNEUMATIC (MISCELLANEOUS) ×3 IMPLANT
DRAPE C-ARM 42X72 X-RAY (DRAPES) ×6 IMPLANT
DRAPE HALF SHEET 40X57 (DRAPES) IMPLANT
DRAPE LAPAROTOMY 100X72X124 (DRAPES) ×3 IMPLANT
DRAPE SURG 17X23 STRL (DRAPES) ×12 IMPLANT
DRSG OPSITE POSTOP 4X6 (GAUZE/BANDAGES/DRESSINGS) ×6 IMPLANT
DURAPREP 26ML APPLICATOR (WOUND CARE) ×3 IMPLANT
ELECT REM PT RETURN 9FT ADLT (ELECTROSURGICAL) ×3
ELECTRODE REM PT RTRN 9FT ADLT (ELECTROSURGICAL) ×1 IMPLANT
EVACUATOR 1/8 PVC DRAIN (DRAIN) IMPLANT
GAUZE 4X4 16PLY RFD (DISPOSABLE) IMPLANT
GAUZE SPONGE 4X4 12PLY STRL (GAUZE/BANDAGES/DRESSINGS) IMPLANT
GLOVE BIO SURGEON STRL SZ 6.5 (GLOVE) ×10 IMPLANT
GLOVE BIO SURGEONS STRL SZ 6.5 (GLOVE) ×5
GLOVE BIOGEL PI IND STRL 6.5 (GLOVE) ×4 IMPLANT
GLOVE BIOGEL PI IND STRL 8 (GLOVE) ×2 IMPLANT
GLOVE BIOGEL PI INDICATOR 6.5 (GLOVE) ×8
GLOVE BIOGEL PI INDICATOR 8 (GLOVE) ×4
GLOVE ECLIPSE 8.0 STRL XLNG CF (GLOVE) ×3 IMPLANT
GLOVE ECLIPSE 9.0 STRL (GLOVE) ×6 IMPLANT
GOWN STRL REUS W/ TWL LRG LVL3 (GOWN DISPOSABLE) ×3 IMPLANT
GOWN STRL REUS W/ TWL XL LVL3 (GOWN DISPOSABLE) ×3 IMPLANT
GOWN STRL REUS W/TWL 2XL LVL3 (GOWN DISPOSABLE) IMPLANT
GOWN STRL REUS W/TWL LRG LVL3 (GOWN DISPOSABLE) ×6
GOWN STRL REUS W/TWL XL LVL3 (GOWN DISPOSABLE) ×6
GRAFT BN 5X1XSPNE CVD POST DBM (Bone Implant) ×1 IMPLANT
GRAFT BONE MAGNIFUSE 1X5CM (Bone Implant) ×2 IMPLANT
KIT BASIN OR (CUSTOM PROCEDURE TRAY) ×3 IMPLANT
KIT INFUSE SMALL (Orthopedic Implant) ×3 IMPLANT
KIT TURNOVER KIT B (KITS) ×3 IMPLANT
MILL MEDIUM DISP (BLADE) IMPLANT
NEEDLE HYPO 22GX1.5 SAFETY (NEEDLE) ×3 IMPLANT
NS IRRIG 1000ML POUR BTL (IV SOLUTION) ×3 IMPLANT
OIL CARTRIDGE MAESTRO DRILL (MISCELLANEOUS) ×3
PACK LAMINECTOMY NEURO (CUSTOM PROCEDURE TRAY) ×3 IMPLANT
ROD SPINAL 5.5X70MM CURVE COBA (Rod) ×3 IMPLANT
ROD SPINAL 5.5X90 COBALT (Rod) ×3 IMPLANT
SCREW CONNECTOR SET (Screw) ×24 IMPLANT
SPONGE SURGIFOAM ABS GEL 100 (HEMOSTASIS) ×3 IMPLANT
STRIP CLOSURE SKIN 1/2X4 (GAUZE/BANDAGES/DRESSINGS) ×4 IMPLANT
SUT VIC AB 0 CT1 18XCR BRD8 (SUTURE) ×2 IMPLANT
SUT VIC AB 0 CT1 8-18 (SUTURE) ×4
SUT VIC AB 2-0 CT1 18 (SUTURE) ×6 IMPLANT
SUT VIC AB 3-0 SH 8-18 (SUTURE) ×9 IMPLANT
TOWEL GREEN STERILE (TOWEL DISPOSABLE) ×3 IMPLANT
TOWEL GREEN STERILE FF (TOWEL DISPOSABLE) ×3 IMPLANT
TRAY FOLEY MTR SLVR 16FR STAT (SET/KITS/TRAYS/PACK) ×3 IMPLANT
WATER STERILE IRR 1000ML POUR (IV SOLUTION) ×3 IMPLANT

## 2020-07-30 NOTE — Evaluation (Signed)
Physical Therapy Evaluation and Discharge Patient Details Name: David Mathews MRN: [id number] DOB: [birth year] Today's Date: 07/30/2020   History of Present Illness  Pt is a 79 y/o male s/p revension of L3-4 posterolateral arthrodesis. PMH includes DM and HTN.   Clinical Impression  Patient evaluated by Physical Therapy with no further acute PT needs identified. All education has been completed and the patient has no further questions. Pt requiring min guard for safety only with mobility tasks. Guarded during gait and reaching for rail. Educated about using walker at home to increase safety and pt agreeable. Reviewed back precautions and generalized walking program. Pt reports son can assist at d/c. See below for any follow-up Physical Therapy or equipment needs. PT is signing off. Thank you for this referral. If needs change, please re-consult.     Follow Up Recommendations No PT follow up;Supervision for mobility/OOB    Equipment Recommendations  None recommended by PT    Recommendations for Other Services       Precautions / Restrictions Precautions Precautions: Back Precaution Booklet Issued: Yes (comment) Precaution Comments: Reviewed back precautions with pt.  Required Braces or Orthoses: Spinal Brace Spinal Brace: Lumbar corset Restrictions Weight Bearing Restrictions: No      Mobility  Bed Mobility Overal bed mobility: Needs Assistance Bed Mobility: Rolling;Sidelying to Sit Rolling: Supervision Sidelying to sit: Supervision       General bed mobility comments: Supervision for safety. Cues for technique to use log roll technique.   Transfers Overall transfer level: Needs assistance Equipment used: None Transfers: Sit to/from Stand Sit to Stand: Min guard         General transfer comment: Min guard for safety.   Ambulation/Gait Ambulation/Gait assistance: Min guard Gait Distance (Feet): 200 Feet Assistive device: None Gait Pattern/deviations:  Step-through pattern;Decreased stride length Gait velocity: Decreased   General Gait Details: Guarded, waddle type gait. Pt reaching for rails for support in hallway. Min guard for safety. Educated about using RW at home to increase safety. Also educated about generalized walking program to perform at home.   Stairs Stairs: Yes Stairs assistance: Min guard Stair Management: One rail Left;Step to pattern Number of Stairs: 2 General stair comments: Practiced stair navigation using L rail. Cues for sequencing. Min guard for safety.   Wheelchair Mobility    Modified Rankin (Stroke Patients Only)       Balance Overall balance assessment: Needs assistance Sitting-balance support: No upper extremity supported;Feet supported Sitting balance-Leahy Scale: Good     Standing balance support: No upper extremity supported;During functional activity Standing balance-Leahy Scale: Fair Standing balance comment: Able to maintain static standing without UE support                              Pertinent Vitals/Pain Pain Assessment: Faces Faces Pain Scale: Hurts a little bit Pain Location: back Pain Descriptors / Indicators: Aching;Operative site guarding Pain Intervention(s): Monitored during session;Limited activity within patient's tolerance;Repositioned    Home Living Family/patient expects to be discharged to:: Private residence Living Arrangements: Alone Available Help at Discharge: Family;Available 24 hours/day Type of Home: House Home Access: Stairs to enter Entrance Stairs-Rails: Doctor, general practice of Steps: 2 Home Layout: One level Home Equipment: Walker - 2 wheels;Walker - 4 wheels;Shower seat - built in;Toilet riser Additional Comments: Reports son will be staying with him initially    Prior Function Level of Independence: Independent with assistive device(s)  Comments: Reports sometimes uses a cane     Hand Dominance   Dominant  Hand: Left    Extremity/Trunk Assessment   Upper Extremity Assessment Upper Extremity Assessment: Defer to OT evaluation    Lower Extremity Assessment Lower Extremity Assessment: Generalized weakness    Cervical / Trunk Assessment Cervical / Trunk Assessment: Other exceptions Cervical / Trunk Exceptions: s/p lumbar surgery   Communication   Communication: No difficulties  Cognition Arousal/Alertness: Awake/alert Behavior During Therapy: WFL for tasks assessed/performed Overall Cognitive Status: No family/caregiver present to determine baseline cognitive functioning                                        General Comments      Exercises     Assessment/Plan    PT Assessment Patent does not need any further PT services  PT Problem List         PT Treatment Interventions      PT Goals (Current goals can be found in the Care Plan section)  Acute Rehab PT Goals Patient Stated Goal: to go home today PT Goal Formulation: With patient Time For Goal Achievement: 07/30/20 Potential to Achieve Goals: Good    Frequency     Barriers to discharge        Co-evaluation               AM-PAC PT "6 Clicks" Mobility  Outcome Measure Help needed turning from your back to your side while in a flat bed without using bedrails?: None Help needed moving from lying on your back to sitting on the side of a flat bed without using bedrails?: None Help needed moving to and from a bed to a chair (including a wheelchair)?: A Little Help needed standing up from a chair using your arms (e.g., wheelchair or bedside chair)?: A Little Help needed to walk in hospital room?: A Little Help needed climbing 3-5 steps with a railing? : A Little 6 Click Score: 20    End of Session Equipment Utilized During Treatment: Gait belt Activity Tolerance: Patient tolerated treatment well Patient left: in bed;with call bell/phone within reach (sitting EOB ) Nurse Communication:  Mobility status PT Visit Diagnosis: Muscle weakness (generalized) (M62.81);Other abnormalities of gait and mobility (R26.89)    Time: 8185-6314 PT Time Calculation (min) (ACUTE ONLY): 12 min   Charges:   PT Evaluation $PT Eval Low Complexity: 1 Low          Cindee Salt, DPT  Acute Rehabilitation Services  Pager: [phone] Office: [phone]   Lehman Prom 07/30/2020, 2:54 PM

## 2020-07-30 NOTE — Discharge Summary (Addendum)
Physician Discharge Summary     Providing Compassionate, Quality Care - Together   Patient ID: David Mathews MRN: [id number] DOB/AGE: 78-Mar-1942 78 y.o.  Admit date: 07/30/2020 Discharge date: 07/30/2020  Admission Diagnoses: Lumbar pseudoarthrosis  Discharge Diagnoses:  Active Problems:   Lumbar pseudoarthrosis   Discharged Condition: good  Hospital Course: Patient underwent reexploration of L3-4 posterior lateral arthrodesis with revision by Dr. Jordan Likes on 07/30/2020. He was admitted to 3C06 for observation. His postoperative course has been uncomplicated. He has worked with physical therapy who feel the patient is ready for discharge home. He is ambulating independently and without difficulty. He is tolerating a normal diet. He is not having any bowel or bladder dysfunction. His pain is well-controlled with oral pain medication. He is ready for discharge home.   Consults: rehabilitation medicine  Significant Diagnostic Studies: DG Lumbar Spine 2-3 Views  Result Date: 07/30/2020 CLINICAL DATA:  Intraoperative lumbar fusion EXAM: LUMBAR SPINE - 2-3 VIEW; DG C-ARM 1-60 MIN COMPARISON:  06/30/2020 FINDINGS: 3 C-arm fluoroscopic images were obtained intraoperatively and submitted for post operative interpretation. Intraoperative images demonstrate interval placement of connectors traversing the L3-4 levels with patient's existing fusion hardware. 8 seconds of fluoroscopy time was utilized. Please see the performing provider's procedural report for further detail. IMPRESSION: As above. Electronically Signed   By: Duanne Guess D.O.   On: 07/30/2020 12:44   DG C-Arm 1-60 Min  Result Date: 07/30/2020 CLINICAL DATA:  Intraoperative lumbar fusion EXAM: LUMBAR SPINE - 2-3 VIEW; DG C-ARM 1-60 MIN COMPARISON:  06/30/2020 FINDINGS: 3 C-arm fluoroscopic images were obtained intraoperatively and submitted for post operative interpretation. Intraoperative images demonstrate interval placement of  connectors traversing the L3-4 levels with patient's existing fusion hardware. 8 seconds of fluoroscopy time was utilized. Please see the performing provider's procedural report for further detail. IMPRESSION: As above. Electronically Signed   By: Duanne Guess D.O.   On: 07/30/2020 12:44     Treatments: Surgery: Reexploration of L3-4 posterior lateral arthrodesis with revision posterior lateral arthrodesis utilizing nonsegmental instrumentation, local autograft, morselized allograft, and bone morphogenic protein  Discharge Exam: Blood pressure (!) 150/76, pulse (!) 57, temperature 97.9 F (36.6 C), temperature source Oral, resp. rate 16, height 5\' 9"  (1.753 m), weight 87.3 kg, SpO2 99 %.  Per nursing report: Alert and oriented x 4 PERRLA CN II-XII grossly intact MAE, Strength and sensation intact aside from chronic dorsiflexion weakness bilaterally Incision is covered with Honeycomb dressing and Steri Strips; Dressing is clean, dry, and intact   Disposition: Discharge disposition: 01-Home or Self Care        Allergies as of 07/30/2020      Reactions   Amoxicillin Rash, Other (See Comments)   Yeast infection Did it involve swelling of the face/tongue/throat, SOB, or low BP? No Did it involve sudden or severe rash/hives, skin peeling, or any reaction on the inside of your mouth or nose? No Did you need to seek medical attention at a hospital or doctor's office? No When did it last happen?4 years If all above answers are "NO", may proceed with cephalosporin use.      Medication List    STOP taking these medications   celecoxib 200 MG capsule Commonly known as: CELEBREX     TAKE these medications   aspirin EC 81 MG tablet Take 81 mg by mouth daily. Swallow whole.   BIOFREEZE EX Apply 1 application topically daily as needed (pain shoulder).   CLEAR EYES OP Place 1 drop into both  eyes daily as needed (irritation).   cyclobenzaprine 10 MG tablet Commonly known  as: FLEXERIL Take 1 tablet (10 mg total) by mouth 2 (two) times daily as needed for muscle spasms.   docusate sodium 100 MG capsule Commonly known as: Colace Take 1 capsule (100 mg total) by mouth 2 (two) times daily.   famotidine 40 MG tablet Commonly known as: PEPCID Take 40 mg by mouth at bedtime.   ferrous sulfate 325 (65 FE) MG tablet Commonly known as: FerrouSul Take 1 tablet (325 mg total) by mouth 3 (three) times daily with meals for 14 days.   finasteride 5 MG tablet Commonly known as: PROSCAR Take 5 mg by mouth every evening.   furosemide 20 MG tablet Commonly known as: LASIX Take 20 mg by mouth daily.   HYDROcodone-acetaminophen 5-325 MG tablet Commonly known as: Norco Take 1-2 tablets by mouth every 6 (six) hours as needed for moderate pain or severe pain.   insulin glargine 100 UNIT/ML Solostar Pen Commonly known as: Lantus SoloStar Inject 55 Units into the skin daily. Per sliding scale What changed:   how much to take  when to take this  additional instructions   levothyroxine 88 MCG tablet Commonly known as: SYNTHROID Take 88 mcg by mouth daily before breakfast.   lisinopril-hydrochlorothiazide 20-25 MG tablet Commonly known as: ZESTORETIC Take 1 tablet by mouth daily.   metFORMIN 1000 MG tablet Commonly known as: GLUCOPHAGE Take 1,000 mg by mouth 2 (two) times daily with a meal.   MULTIVITAMIN PO Take 1 tablet by mouth daily. 50 +   NovoLOG FlexPen 100 UNIT/ML FlexPen Generic drug: insulin aspart Inject 10-14 Units into the skin 3 (three) times daily with meals. PER SLIDING SCALE   polyethylene glycol 17 g packet Commonly known as: MIRALAX / GLYCOLAX Take 17 g by mouth 2 (two) times daily. What changed:   when to take this  reasons to take this   Potassium 99 MG Tabs Take 198 mg by mouth 2 (two) times daily.   pregabalin 225 MG capsule Commonly known as: LYRICA Take 225 mg by mouth at bedtime.   pregabalin 150 MG  capsule Commonly known as: LYRICA Take 150 mg by mouth daily.   sildenafil 100 MG tablet Commonly known as: VIAGRA Take 100 mg by mouth daily as needed for erectile dysfunction.   simvastatin 20 MG tablet Commonly known as: ZOCOR Take 20 mg by mouth every evening.   Uroxatral 10 MG 24 hr tablet Generic drug: alfuzosin Take 10 mg by mouth daily.   Voltaren 1 % Gel Generic drug: diclofenac Sodium Apply 2 g topically daily as needed (Knee pain).            Durable Medical Equipment  (From admission, onward)         Start     Ordered   07/30/20 1251  DME Walker rolling  Once       Question:  Patient needs a walker to treat with the following condition  Answer:  Lumbar pseudoarthrosis   07/30/20 1250   07/30/20 1251  DME 3 n 1  Once        07/30/20 1250          Follow-up Information    Julio Sicks, MD. Go on 08/10/2020.   Specialty: Neurosurgery Why: First post op appointment is on 08/10/2020 at 4:30 pm. Contact information: [address] [place] [address] [phone]  Signed: Floreen Comber 07/30/2020, 5:11 PM

## 2020-07-30 NOTE — Anesthesia Procedure Notes (Signed)
Procedure Name: Intubation Date/Time: 07/30/2020 9:14 AM Performed by: Wilburn Cornelia, CRNA Pre-anesthesia Checklist: Patient identified, Emergency Drugs available, Suction available, Patient being monitored and Timeout performed Patient Re-evaluated:Patient Re-evaluated prior to induction Oxygen Delivery Method: Circle system utilized Preoxygenation: Pre-oxygenation with 100% oxygen Induction Type: IV induction Ventilation: Mask ventilation without difficulty Laryngoscope Size: Mac and 4 Grade View: Grade I Tube type: Oral Tube size: 7.5 mm Number of attempts: 1 Airway Equipment and Method: Stylet Placement Confirmation: ETT inserted through vocal cords under direct vision,  positive ETCO2,  CO2 detector and breath sounds checked- equal and bilateral Secured at: 23 cm Tube secured with: Tape Dental Injury: Teeth and Oropharynx as per pre-operative assessment

## 2020-07-30 NOTE — Op Note (Signed)
Date of procedure: 07/30/2020  Date of dictation: Same  Service: Neurosurgery  Preoperative diagnosis: L3-4 fusion fracture with pain and instability  Postoperative diagnosis: Same  Procedure Name: Reexploration of L3-4 posterior lateral arthrodesis with revision posterior lateral arthrodesis utilizing nonsegmental instrumentation, local autograft, morselized allograft, and bone morphogenic protein  Surgeon: A., M.D.  Asst. Surgeon: Dawley  Anesthesia: General  Indication: Patient is 79 year old male status post prior thoracic lumbar pelvic fusion who presents with worsening back pain.  Work-up demonstrates evidence of fracture through his L3-4 fusion mass with evidence of instability with flexion and extension.  Patient presents now for fusion revision in hopes of improving his symptoms  Operative note: After induction anesthesia, patient position prone and appropriately padded.  Lumbar region prepped and draped sterilely.  Paramedian incisions were made bilaterally.  The L2-3 rod segment on the left and right were dissected free the gap between the screws at L3 and L4 was dissected free and the screws at L4-5 and the L5-S1 segment on the left were dissected free.  Retractor placed.  The rod was cleaned of soft tissue and surrounding bone was removed between L2 and L3 on the left side and between L5 and S1 on the left side.  Med[employer] Solera connectors were then attached to the rod with a spanning chromium cobalt rod between the connectors.  This was tightened down in place and given a final tightening.  The procedure was repeated on the right side this time placing the connector between L3 and L2 on the right and between L4 and L5 on the right.  Once again a spanning rod was placed between the connectors to bridge the noninstrumented segment.  Final images reveal good position of the connectors at the proper operative levels with improved alignment of the spine.  Posterior lateral  fusion mass was further decorticated.  Morselized autograft was collected and reapplied.  Bone morphogenic protein soaked sponges were placed along the L3-4 fusion mass as well as morselized allograft.  Vancomycin powder placed in deep wound space.  Wounds and closed in layers with Vicryl sutures.  Steri-Strips and sterile dressing were applied.  No apparent complications.  Patient tolerated the procedure well and he returned to the recovery room postop.

## 2020-07-30 NOTE — Transfer of Care (Signed)
Immediate Anesthesia Transfer of Care Note  Patient: David Mathews  Procedure(s) Performed: Posterior lateral fusion - Lumbar Three-Four Revision Instrumentation with Lumbar Two-Lumbar five connectors (N/A Spine Lumbar)  Patient Location: PACU  Anesthesia Type:General  Level of Consciousness: awake, alert  and oriented  Airway & Oxygen Therapy: Patient Spontanous Breathing and Patient connected to nasal cannula oxygen  Post-op Assessment: Report given to RN and Post -op Vital signs reviewed and stable  Post vital signs: Reviewed and stable  Last Vitals:  Vitals Value Taken Time  BP 130/81 07/30/20 1121  Temp    Pulse 77 07/30/20 1121  Resp 11 07/30/20 1121  SpO2 98 % 07/30/20 1121  Vitals shown include unvalidated device data.  Last Pain:  Vitals:   07/30/20 0826  PainSc: 3       Patients Stated Pain Goal: 4 (07/30/20 0826)  Complications: No complications documented.

## 2020-07-30 NOTE — Plan of Care (Signed)
Patient alert and oriented, mae's well, voiding adequate amount of urine, swallowing without difficulty, no c/o pain at time of discharge. Patient discharged home with family. Script and discharged instructions given to patient. Patient and family stated understanding of instructions given. Patient has an appointment with Dr. Pool  

## 2020-07-30 NOTE — Brief Op Note (Signed)
07/30/2020  11:09 AM  PATIENT:  David Mathews  79 y.o. male  PRE-OPERATIVE DIAGNOSIS:  fracture with nonunion  POST-OPERATIVE DIAGNOSIS:  Fracture with Nonunion  PROCEDURE:  Procedure(s) with comments: Posterior lateral fusion - Lumbar Three-Four Revision Instrumentation with Lumbar Two-Lumbar five connectors (N/A) - posterior  SURGEON:  Surgeon(s) and Role:    * Julio Sicks, MD - Primary    * Dawley, Alan Mulder, DO - Assisting  PHYSICIAN ASSISTANT:   ASSISTANTSMarland Mcalpine   ANESTHESIA:   general  EBL:  75 mL   BLOOD ADMINISTERED:none  DRAINS: none   LOCAL MEDICATIONS USED:  MARCAINE     SPECIMEN:  No Specimen  DISPOSITION OF SPECIMEN:  N/A  COUNTS:  YES  TOURNIQUET:  * No tourniquets in log *  DICTATION: .Dragon Dictation  PLAN OF CARE: Admit for overnight observation  PATIENT DISPOSITION:  PACU - hemodynamically stable.   Delay start of Pharmacological VTE agent (>24hrs) due to surgical blood loss or risk of bleeding: yes
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
## 2020-07-30 NOTE — Discharge Instructions (Addendum)
Wound Care Keep incision covered and dry for  days.    Do not put any creams, lotions, or ointments on incision. Leave steri-strips on back.  They will fall off by themselves. Activity Walk each and every day, increasing distance each day. No lifting greater than 5 lbs. No driving for 2 weeks; may ride as a passenger locally. Diet Resume your normal diet.  Return to Work Will be discussed at your follow up appointment. Call Your Doctor If Any of These Occur Redness, drainage, or swelling at the wound.  Temperature greater than 101 degrees. Severe pain not relieved by pain medication. Incision starts to come apart. Follow Up Appt Call today for appointment in 1-2 weeks [phone]) or for problems.

## 2020-08-02 ENCOUNTER — Encounter (HOSPITAL_COMMUNITY): Payer: Self-pay | Admitting: Neurosurgery

## 2020-08-02 NOTE — Anesthesia Postprocedure Evaluation (Signed)
Anesthesia Post Note  Patient: David Mathews  Procedure(s) Performed: Posterior lateral fusion - Lumbar Three-Four Revision Instrumentation with Lumbar Two-Lumbar five connectors (N/A Spine Lumbar)     Patient location during evaluation: PACU Anesthesia Type: General Level of consciousness: awake and alert Pain management: pain level controlled Vital Signs Assessment: post-procedure vital signs reviewed and stable Respiratory status: spontaneous breathing, nonlabored ventilation, respiratory function stable and patient connected to nasal cannula oxygen Cardiovascular status: blood pressure returned to baseline and stable Postop Assessment: no apparent nausea or vomiting Anesthetic complications: no   No complications documented.  Last Vitals:  Vitals:   07/30/20 1256 07/30/20 1533  BP: 132/77 (!) 150/76  Pulse: 71 (!) 57  Resp: 18 16  Temp: 36.4 C 36.6 C  SpO2: 97% 99%    Last Pain:  Vitals:   07/30/20 1533  TempSrc: Oral  PainSc:                   

## 2020-08-17 DIAGNOSIS — H353131 Nonexudative age-related macular degeneration, bilateral, early dry stage: Secondary | ICD-10-CM | POA: Diagnosis not present

## 2020-08-17 DIAGNOSIS — E103293 Type 1 diabetes mellitus with mild nonproliferative diabetic retinopathy without macular edema, bilateral: Secondary | ICD-10-CM | POA: Diagnosis not present

## 2020-09-07 DIAGNOSIS — S32009K Unspecified fracture of unspecified lumbar vertebra, subsequent encounter for fracture with nonunion: Secondary | ICD-10-CM | POA: Diagnosis not present

## 2020-09-07 DIAGNOSIS — Z6829 Body mass index (BMI) 29.0-29.9, adult: Secondary | ICD-10-CM | POA: Diagnosis not present

## 2020-09-07 DIAGNOSIS — I10 Essential (primary) hypertension: Secondary | ICD-10-CM | POA: Diagnosis not present

## 2020-09-21 ENCOUNTER — Other Ambulatory Visit: Payer: Self-pay | Admitting: *Deleted

## 2020-09-22 NOTE — Patient Instructions (Signed)
Goals Addressed              This Visit's Progress   .  (THN)A1C will be less than 8.1 (pt-stated)   Not on track     [place] (see longtitudinal plan of care for additional care plan information)  Objective:  Lab Results  Component Value Date   HGBA1C 8.6 (H) 11/10/2019 .   Lab Results  Component Value Date   CREATININE 0.71 11/18/2019   CREATININE 0.60 (L) 11/18/2019   CREATININE 0.73 11/10/2019 .   [name] No results found for: EGFR  Current Barriers:  [name] Knowledge Deficits related to basic Diabetes pathophysiology and self care/management . Behavior modification  Case Manager Clinical Goal(s):  Over the next 90 days, patient will demonstrate improved adherence to prescribed treatment plan for diabetes self care/management as evidenced by:  [name] Verbalize daily monitoring and recording of CBG within 90 days . Verbalize adherence to ADA/ carb modified diet within the next 90 days . Verbalize adherence to prescribed medication regimen within the next 90 days   Interventions:  . Provided education to patient about basic DM disease process . Reviewed scheduled/upcoming provider appointments including: Xrays, and MRI . Advised patient, providing education and rationale, to check cbg as per ordered and record, calling PCP for findings outside established parameters.    Patient Self Care Activities:  . Self administers oral medications as prescribed . Attends all scheduled provider appointments . Checks blood sugars as prescribed and utilize hyper and hypoglycemia protocol as needed . Adheres to prescribed ADA/carb modified  Patient A1C has increased from 8.6 to 8.7    .  (THN)Learn More About My Health        [id number]   - ask questions - bring a list of my medicines to the visit - speak up when I don't understand    Why is this important?   The best way to learn about your health and care is by talking to the doctor and nurse.  They will answer your questions and  give you information in the way that you like best.    Notes:     .  ([place] Exam        Follow Up Date [id number]   - schedule appointment with eye doctor    Why is this important?   Eye check-ups are important when you have diabetes.  Vision loss can be prevented.    Notes:     .  (THN)Perform Foot Care        Follow Up Date 12/21/2019   - check feet daily for cuts, sores or redness - trim toenails straight across - wash and dry feet carefully every day - wear comfortable, cotton socks - wear comfortable, well-fitting shoes    Why is this important?   Good foot care is very important when you have diabetes.  There are many things you can do to keep your feet healthy and catch a problem early.    Notes:     .  (THN)Set My Target A1C        Follow Up Date [id number]   - set target A1C    Why is this important?   Your target A1C is decided together by you and your doctor.  It is based on several things like your age and other health issues.    Notes:

## 2020-09-22 NOTE — Patient Outreach (Signed)
[facility]) Care Management  [facility] Care Manager  09/22/2020   David Mathews Jan 18, 1941 [id number]  RN Health Coach telephone call to patient.  Hipaa compliance verified. Per patient fasting blood sugar is 79 today. Patient stated that he does have some episodes of his blood sugar dropping below 79. Per patient he knows the symptoms of hypoglycemia and has candy in vehicle and glucose gel.  Patient has been trying to adjust his insulin and stated that can be what caused that.  Patient A1C has increased from 8.6 to 8.7.  Patient had outpatient spinal surgery and is doing better. Per patient he has just got a membership for silver sneakers. Patient stated that it is a drive of 45 minutes to go in to the [facility] and the [employer]. Patient stated that he was going to try to go back and exercise. Patient has agreed to follow up outreach calls.   Encounter Medications:  Outpatient Encounter Medications as of 09/21/2020  Medication Sig Note  . alfuzosin (UROXATRAL) 10 MG 24 hr tablet Take 10 mg by mouth daily.   [name] aspirin EC 81 MG tablet Take 81 mg by mouth daily. Swallow whole.   . cyclobenzaprine (FLEXERIL) 10 MG tablet Take 1 tablet (10 mg total) by mouth 2 (two) times daily as needed for muscle spasms.   . diclofenac Sodium (VOLTAREN) 1 % GEL Apply 2 g topically daily as needed (Knee pain).   [name] docusate sodium (COLACE) 100 MG capsule Take 1 capsule (100 mg total) by mouth 2 (two) times daily. (Patient not taking: Reported on 07/23/2020)   . famotidine (PEPCID) 40 MG tablet Take 40 mg by mouth at bedtime.    . ferrous sulfate (FERROUSUL) 325 (65 FE) MG tablet Take 1 tablet (325 mg total) by mouth 3 (three) times daily with meals for 14 days.   . finasteride (PROSCAR) 5 MG tablet Take 5 mg by mouth every evening.    . furosemide (LASIX) 20 MG tablet Take 20 mg by mouth daily.   [name] HYDROcodone-acetaminophen (NORCO) 5-325 MG tablet Take 1-2 tablets by mouth every 6 (six) hours as  needed for moderate pain or severe pain.   [name] insulin aspart (NOVOLOG FLEXPEN) 100 UNIT/ML FlexPen Inject 10-14 Units into the skin 3 (three) times daily with meals. PER SLIDING SCALE 07/30/2020: Took 10 units  . Insulin Glargine (LANTUS SOLOSTAR) 100 UNIT/ML Solostar Pen Inject 55 Units into the skin daily. Per sliding scale (Patient taking differently: Inject 22 Units into the skin daily before breakfast. ) 07/30/2020: Took 11 units  . levothyroxine (SYNTHROID, LEVOTHROID) 88 MCG tablet Take 88 mcg by mouth daily before breakfast.   . lisinopril-hydrochlorothiazide (PRINZIDE,ZESTORETIC) 20-25 MG per tablet Take 1 tablet by mouth daily.    . Menthol, Topical Analgesic, (BIOFREEZE EX) Apply 1 application topically daily as needed (pain shoulder).    . metFORMIN (GLUCOPHAGE) 1000 MG tablet Take 1,000 mg by mouth 2 (two) times daily with a meal.   . Multiple Vitamins-Minerals (MULTIVITAMIN PO) Take 1 tablet by mouth daily. 50 +   . Naphazoline HCl (CLEAR EYES OP) Place 1 drop into both eyes daily as needed (irritation).   . polyethylene glycol (MIRALAX / GLYCOLAX) 17 g packet Take 17 g by mouth 2 (two) times daily. (Patient taking differently: Take 17 g by mouth daily as needed for mild constipation or moderate constipation. )   . Potassium 99 MG TABS Take 198 mg by mouth 2 (two) times daily.   [name]  pregabalin (LYRICA) 150 MG capsule Take 150 mg by mouth daily.   . pregabalin (LYRICA) 225 MG capsule Take 225 mg by mouth at bedtime.    . sildenafil (VIAGRA) 100 MG tablet Take 100 mg by mouth daily as needed for erectile dysfunction.   . simvastatin (ZOCOR) 20 MG tablet Take 20 mg by mouth every evening.     No facility-administered encounter medications on file as of 09/21/2020.    Functional Status:  In your present state of health, do you have any difficulty performing the following activities: 07/28/2020 11/10/2019  Hearing? N N  Vision? N N  Difficulty concentrating or making decisions? N N  Walking  or climbing stairs? N Y  Dressing or bathing? N N  Doing errands, shopping? N N  Some recent data might be hidden    Fall/Depression Screening: Fall Risk  09/21/2020 07/21/2020 01/20/2020  Falls in the past year? 1 1 0  Comment - - -  Number falls in past yr: 1 1 0  Injury with Fall? 0 0 0  Risk for fall due to : History of fall(s);Impaired balance/gait;Impaired mobility History of fall(s);Impaired balance/gait;Impaired mobility Impaired balance/gait;Impaired mobility  Follow up Falls evaluation completed Falls evaluation completed Falls evaluation completed   PHQ 2/9 Scores 01/20/2020 09/12/2019 08/15/2019 08/02/2015  PHQ - 2 Score 0 0 0 0    Assessment:  Goals Addressed              This Visit's Progress   .  (THN)A1C will be less than 8.1 (pt-stated)   Not on track     [place] (see longtitudinal plan of care for additional care plan information)  Objective:  Lab Results  Component Value Date   HGBA1C 8.6 (H) 11/10/2019 .   Lab Results  Component Value Date   CREATININE 0.71 11/18/2019   CREATININE 0.60 (L) 11/18/2019   CREATININE 0.73 11/10/2019 .   [name] No results found for: EGFR  Current Barriers:  [name] Knowledge Deficits related to basic Diabetes pathophysiology and self care/management . Behavior modification  Case Manager Clinical Goal(s):  Over the next 90 days, patient will demonstrate improved adherence to prescribed treatment plan for diabetes self care/management as evidenced by:  [name] Verbalize daily monitoring and recording of CBG within 90 days . Verbalize adherence to ADA/ carb modified diet within the next 90 days . Verbalize adherence to prescribed medication regimen within the next 90 days   Interventions:  . Provided education to patient about basic DM disease process . Reviewed scheduled/upcoming provider appointments including: Xrays, and MRI . Advised patient, providing education and rationale, to check cbg as per ordered and record, calling PCP  for findings outside established parameters.    Patient Self Care Activities:  . Self administers oral medications as prescribed . Attends all scheduled provider appointments . Checks blood sugars as prescribed and utilize hyper and hypoglycemia protocol as needed . Adheres to prescribed ADA/carb modified  Patient A1C has increased from 8.6 to 8.7    .  (THN)Learn More About My Health        [id number]   - ask questions - bring a list of my medicines to the visit - speak up when I don't understand    Why is this important?   The best way to learn about your health and care is by talking to the doctor and nurse.  They will answer your questions and give you information in the way that you like best.  Notes:     [facility]        Follow Up Date [id number]   - schedule appointment with eye doctor    Why is this important?   Eye check-ups are important when you have diabetes.  Vision loss can be prevented.    Notes:     .  (THN)Perform Foot Care        Follow Up Date 12/21/2019   - check feet daily for cuts, sores or redness - trim toenails straight across - wash and dry feet carefully every day - wear comfortable, cotton socks - wear comfortable, well-fitting shoes    Why is this important?   Good foot care is very important when you have diabetes.  There are many things you can do to keep your feet healthy and catch a problem early.    Notes:     .  (THN)Set My Target A1C        Follow Up Date [id number]   - set target A1C    Why is this important?   Your target A1C is decided together by you and your doctor.  It is based on several things like your age and other health issues.    Notes:        Plan:  Patient will restart an exercise routine at [employer] or [employer] Provided additional education on hypoglycemic reactions and action plan Discussed healthy eating A1C goal 7.0 or less RN will follow up within the month of January RN  sent update assessment to PCP  [place] Management [phone]

## 2020-10-07 DIAGNOSIS — Z6829 Body mass index (BMI) 29.0-29.9, adult: Secondary | ICD-10-CM | POA: Diagnosis not present

## 2020-10-07 DIAGNOSIS — S32009K Unspecified fracture of unspecified lumbar vertebra, subsequent encounter for fracture with nonunion: Secondary | ICD-10-CM | POA: Diagnosis not present

## 2020-10-07 DIAGNOSIS — I10 Essential (primary) hypertension: Secondary | ICD-10-CM | POA: Diagnosis not present

## 2020-10-22 ENCOUNTER — Telehealth: Payer: Self-pay

## 2020-10-22 NOTE — Telephone Encounter (Signed)
Patient stated he had a missed call, looked in chart for call and could not find one

## 2020-11-03 DIAGNOSIS — E1165 Type 2 diabetes mellitus with hyperglycemia: Secondary | ICD-10-CM | POA: Diagnosis not present

## 2020-11-03 DIAGNOSIS — E1140 Type 2 diabetes mellitus with diabetic neuropathy, unspecified: Secondary | ICD-10-CM | POA: Diagnosis not present

## 2020-11-22 DIAGNOSIS — I10 Essential (primary) hypertension: Secondary | ICD-10-CM | POA: Diagnosis not present

## 2020-11-22 DIAGNOSIS — Z6831 Body mass index (BMI) 31.0-31.9, adult: Secondary | ICD-10-CM | POA: Diagnosis not present

## 2020-11-22 DIAGNOSIS — E1140 Type 2 diabetes mellitus with diabetic neuropathy, unspecified: Secondary | ICD-10-CM | POA: Diagnosis not present

## 2020-11-22 DIAGNOSIS — E1165 Type 2 diabetes mellitus with hyperglycemia: Secondary | ICD-10-CM | POA: Diagnosis not present

## 2020-11-22 DIAGNOSIS — R609 Edema, unspecified: Secondary | ICD-10-CM | POA: Diagnosis not present

## 2020-11-22 DIAGNOSIS — E039 Hypothyroidism, unspecified: Secondary | ICD-10-CM | POA: Diagnosis not present

## 2020-11-22 DIAGNOSIS — M159 Polyosteoarthritis, unspecified: Secondary | ICD-10-CM | POA: Diagnosis not present

## 2020-11-22 DIAGNOSIS — M545 Low back pain, unspecified: Secondary | ICD-10-CM | POA: Diagnosis not present

## 2020-11-22 DIAGNOSIS — E7800 Pure hypercholesterolemia, unspecified: Secondary | ICD-10-CM | POA: Diagnosis not present

## 2020-11-22 DIAGNOSIS — N401 Enlarged prostate with lower urinary tract symptoms: Secondary | ICD-10-CM | POA: Diagnosis not present

## 2020-11-22 DIAGNOSIS — N529 Male erectile dysfunction, unspecified: Secondary | ICD-10-CM | POA: Diagnosis not present

## 2020-11-25 DIAGNOSIS — Z6829 Body mass index (BMI) 29.0-29.9, adult: Secondary | ICD-10-CM | POA: Diagnosis not present

## 2020-11-25 DIAGNOSIS — M48061 Spinal stenosis, lumbar region without neurogenic claudication: Secondary | ICD-10-CM | POA: Diagnosis not present

## 2020-11-25 DIAGNOSIS — S32009K Unspecified fracture of unspecified lumbar vertebra, subsequent encounter for fracture with nonunion: Secondary | ICD-10-CM | POA: Diagnosis not present

## 2020-11-25 DIAGNOSIS — R030 Elevated blood-pressure reading, without diagnosis of hypertension: Secondary | ICD-10-CM | POA: Diagnosis not present

## 2020-11-29 DIAGNOSIS — S335XXD Sprain of ligaments of lumbar spine, subsequent encounter: Secondary | ICD-10-CM | POA: Diagnosis not present

## 2020-12-03 DIAGNOSIS — S335XXD Sprain of ligaments of lumbar spine, subsequent encounter: Secondary | ICD-10-CM | POA: Diagnosis not present

## 2020-12-07 DIAGNOSIS — S335XXD Sprain of ligaments of lumbar spine, subsequent encounter: Secondary | ICD-10-CM | POA: Diagnosis not present

## 2020-12-15 ENCOUNTER — Other Ambulatory Visit: Payer: Self-pay | Admitting: *Deleted

## 2020-12-15 NOTE — Patient Outreach (Signed)
[facility]) Care Management  12/15/2020  David Mathews [birth year] [id number]  RN Health Coach attempted follow up outreach call to patient.  Patient was unavailable. HIPPA compliance voicemail message left with return callback number.  Plan: RN will call patient again within 30 days.  Gean Maidens BSN RN [facility] Care Management [phone]

## 2021-01-14 ENCOUNTER — Other Ambulatory Visit: Payer: Self-pay | Admitting: *Deleted

## 2021-01-14 NOTE — Patient Outreach (Signed)
[facility]) Care Management  01/14/2021  OLLIVANDER SEE 1941-04-12 [id number]  Referral for medication assistance from Gean Maidens, RN sent to [facility].  Baruch Gouty [facility] Management Assistant [phone]

## 2021-01-14 NOTE — Patient Instructions (Signed)
Goals Addressed            This Visit's Progress   . (THN)Learn More About My Health   On track    Timeframe:  Long-Range Goal Priority:  Medium Start Date:      [id number]                       Expected End Date:   [id number]                   Follow up [id number]   - ask questions - bring a list of my medicines to the visit - speak up when I don't understand    Why is this important?   The best way to learn about your health and care is by talking to the doctor and nurse.  They will answer your questions and give you information in the way that you like best.    Notes:  Patient is wanting to get a free style libre 2    . [facility] Eye Exam   On track    .cpf Follow Up Date [id number]   - schedule appointment with eye doctor    Why is this important?   Eye check-ups are important when you have diabetes.  Vision loss can be prevented.    Notes:  Last eye exam [id number]    . (THN)Perform Foot Care   On track    Timeframe:  Long-Range Goal Priority:  Medium Start Date:  [id number]                           Expected End Date:        [id number]              Follow Up Date [id number]   - check feet daily for cuts, sores or redness - trim toenails straight across - wash and dry feet carefully every day - wear comfortable, cotton socks - wear comfortable, well-fitting shoes    Why is this important?   Good foot care is very important when you have diabetes.  There are many things you can do to keep your feet healthy and catch a problem early.    Notes:  01/14/2021 last podiatry visit 04/02/2020    . (THN)Set My Target A1C   On track    Timeframe:  Long-Range Goal Priority:  High Start Date: 9470962                            Expected End Date:       [id number]               Follow Up Date [id number]   - set target A1C    Why is this important?   Your target A1C is decided together by you and your doctor.  It is based on several things like your age and other health  issues.    Notes:  7.0 7.9 A1C now

## 2021-01-14 NOTE — Patient Outreach (Signed)
[facility]) Care Management  [facility] Care Manager  01/14/2021   David Mathews 15-Mar-1941 [id number]  RN Health Coach telephone call to patient.  Hipaa compliance verified. Per patient his fasting blood sugar is 112. His A1C is 7.9. Patient has had some episodes of blood sugars in the 30's last week. Patient stated he woke up and felt a little funny. Patient stated he is taking 12-13 units 3-4 times a day of Novolog and 20 units of Lantus. This is not what the Dr ordered. RN explained that the Dr didn't order night time insulin and this is not the dose of lantus ordered. Insulin was ordered only at meals. He stated that he knows. It is depending on what he eats at night he may take a dose of insulin. RN discussed  patient to look at free style libre 2 since he is having frequent low blood sugars. Per patient when his blood sugar drops he eats M&M's or roll ups. RN discussed eating something with protein like peanut butter cracker that would help the blood sugar to stay up. Patient is not adhering to his diet. He is choosing to eat what he likes.  Per patient his balance is a little off.  He has not had any recent falls. Per patient he does not take the flu vaccine. He stated he does not wear a mask and does not plan to.  Patient has agreed to referral to [facility].Patient has agreed to further outreach calls.  Encounter Medications:  Outpatient Encounter Medications as of 01/14/2021  Medication Sig Note  . alfuzosin (UROXATRAL) 10 MG 24 hr tablet Take 10 mg by mouth daily.   [name] aspirin EC 81 MG tablet Take 81 mg by mouth daily. Swallow whole.   . cyclobenzaprine (FLEXERIL) 10 MG tablet Take 1 tablet (10 mg total) by mouth 2 (two) times daily as needed for muscle spasms.   . diclofenac Sodium (VOLTAREN) 1 % GEL Apply 2 g topically daily as needed (Knee pain).   [name] docusate sodium (COLACE) 100 MG capsule Take 1 capsule (100 mg total) by mouth 2 (two) times daily. (Patient not  taking: Reported on 07/23/2020)   . famotidine (PEPCID) 40 MG tablet Take 40 mg by mouth at bedtime.    . ferrous sulfate (FERROUSUL) 325 (65 FE) MG tablet Take 1 tablet (325 mg total) by mouth 3 (three) times daily with meals for 14 days.   . finasteride (PROSCAR) 5 MG tablet Take 5 mg by mouth every evening.    . furosemide (LASIX) 20 MG tablet Take 20 mg by mouth daily.   [name] HYDROcodone-acetaminophen (NORCO) 5-325 MG tablet Take 1-2 tablets by mouth every 6 (six) hours as needed for moderate pain or severe pain.   [name] insulin aspart (NOVOLOG FLEXPEN) 100 UNIT/ML FlexPen Inject 10-14 Units into the skin 3 (three) times daily with meals. PER SLIDING SCALE 01/14/2021: Per patient he takes 12-13 units  . Insulin Glargine (LANTUS SOLOSTAR) 100 UNIT/ML Solostar Pen Inject 55 Units into the skin daily. Per sliding scale (Patient taking differently: Inject 22 Units into the skin daily before breakfast. ) 01/14/2021: Per patient 20 units in am  . levothyroxine (SYNTHROID, LEVOTHROID) 88 MCG tablet Take 88 mcg by mouth daily before breakfast.   . lisinopril-hydrochlorothiazide (PRINZIDE,ZESTORETIC) 20-25 MG per tablet Take 1 tablet by mouth daily.    . Menthol, Topical Analgesic, (BIOFREEZE EX) Apply 1 application topically daily as needed (pain shoulder).    . metFORMIN (GLUCOPHAGE) 1000  MG tablet Take 1,000 mg by mouth 2 (two) times daily with a meal.   . Multiple Vitamins-Minerals (MULTIVITAMIN PO) Take 1 tablet by mouth daily. 50 +   . Naphazoline HCl (CLEAR EYES OP) Place 1 drop into both eyes daily as needed (irritation).   . polyethylene glycol (MIRALAX / GLYCOLAX) 17 g packet Take 17 g by mouth 2 (two) times daily. (Patient taking differently: Take 17 g by mouth daily as needed for mild constipation or moderate constipation. )   . Potassium 99 MG TABS Take 198 mg by mouth 2 (two) times daily.   . pregabalin (LYRICA) 150 MG capsule Take 150 mg by mouth daily.   . pregabalin (LYRICA) 225 MG capsule Take 225  mg by mouth at bedtime.    . sildenafil (VIAGRA) 100 MG tablet Take 100 mg by mouth daily as needed for erectile dysfunction.   . simvastatin (ZOCOR) 20 MG tablet Take 20 mg by mouth every evening.     No facility-administered encounter medications on file as of 01/14/2021.    Functional Status:  In your present state of health, do you have any difficulty performing the following activities: 07/28/2020  Hearing? N  Vision? N  Difficulty concentrating or making decisions? N  Walking or climbing stairs? N  Dressing or bathing? N  Doing errands, shopping? N  Some recent data might be hidden    Fall/Depression Screening: Fall Risk  01/14/2021 09/21/2020 07/21/2020  Falls in the past year? 1 1 1   Comment - - -  Number falls in past yr: 1 1 1   Injury with Fall? 0 0 0  Risk for fall due to : History of fall(s);Impaired balance/gait;Impaired mobility History of fall(s);Impaired balance/gait;Impaired mobility History of fall(s);Impaired balance/gait;Impaired mobility  Follow up Falls evaluation completed Falls evaluation completed Falls evaluation completed   PHQ 2/9 Scores 01/20/2020 09/12/2019 08/15/2019 08/02/2015  PHQ - 2 Score 0 0 0 0    Assessment:  Goals Addressed            This Visit's Progress   . (THN)Learn More About My Health   On track    Timeframe:  Long-Range Goal Priority:  Medium Start Date:      08/17/2019                       Expected End Date:   10/02/2015                   Follow up [id number]   - ask questions - bring a list of my medicines to the visit - speak up when I don't understand    Why is this important?   The best way to learn about your health and care is by talking to the doctor and nurse.  They will answer your questions and give you information in the way that you like best.    Notes:  Patient is wanting to get a free style libre 2    . [facility] Eye Exam   On track    .cpf Follow Up Date [id number]   - schedule appointment with eye doctor     Why is this important?   Eye check-ups are important when you have diabetes.  Vision loss can be prevented.    Notes:  Last eye exam [facility]    . (THN)Perform Foot Care   On track    Timeframe:  Long-Range Goal Priority:  Medium Start Date:  [id number]  Expected End Date:        [id number]              Follow Up Date [id number]   - check feet daily for cuts, sores or redness - trim toenails straight across - wash and dry feet carefully every day - wear comfortable, cotton socks - wear comfortable, well-fitting shoes    Why is this important?   Good foot care is very important when you have diabetes.  There are many things you can do to keep your feet healthy and catch a problem early.    Notes:  01/14/2021 last podiatry visit 04/02/2020    . (THN)Set My Target A1C   On track    Timeframe:  Long-Range Goal Priority:  High Start Date: 3295188                            Expected End Date:       [id number]               Follow Up Date 60109323   - set target A1C    Why is this important?   Your target A1C is decided together by you and your doctor.  It is based on several things like your age and other health issues.    Notes:  7.0 7.9 A1C now        Plan:  Follow-up:  Patient agrees to Care Plan and Follow-up. Referred to pharmacy Patient will check on benefits for free style libre 2 Patient will follow up with finding a gym to join by next outreach RN discussed medication adherence RN discussed hypoglycemia RN sent educational material on hypoglycemia Provided EMMI education on monitoring blood sugars RN sent up date assessment to PCP RN will follow up within the month of May.  Gean Maidens BSN RN [facility] Care Management [phone]

## 2021-01-26 DIAGNOSIS — M48061 Spinal stenosis, lumbar region without neurogenic claudication: Secondary | ICD-10-CM | POA: Diagnosis not present

## 2021-02-04 DIAGNOSIS — Z96651 Presence of right artificial knee joint: Secondary | ICD-10-CM | POA: Diagnosis not present

## 2021-04-12 DIAGNOSIS — N401 Enlarged prostate with lower urinary tract symptoms: Secondary | ICD-10-CM | POA: Diagnosis not present

## 2021-04-12 DIAGNOSIS — M545 Low back pain, unspecified: Secondary | ICD-10-CM | POA: Diagnosis not present

## 2021-04-12 DIAGNOSIS — E7800 Pure hypercholesterolemia, unspecified: Secondary | ICD-10-CM | POA: Diagnosis not present

## 2021-04-12 DIAGNOSIS — Z6831 Body mass index (BMI) 31.0-31.9, adult: Secondary | ICD-10-CM | POA: Diagnosis not present

## 2021-04-12 DIAGNOSIS — Z79899 Other long term (current) drug therapy: Secondary | ICD-10-CM | POA: Diagnosis not present

## 2021-04-12 DIAGNOSIS — E1140 Type 2 diabetes mellitus with diabetic neuropathy, unspecified: Secondary | ICD-10-CM | POA: Diagnosis not present

## 2021-04-12 DIAGNOSIS — Z125 Encounter for screening for malignant neoplasm of prostate: Secondary | ICD-10-CM | POA: Diagnosis not present

## 2021-04-12 DIAGNOSIS — I10 Essential (primary) hypertension: Secondary | ICD-10-CM | POA: Diagnosis not present

## 2021-04-12 DIAGNOSIS — N529 Male erectile dysfunction, unspecified: Secondary | ICD-10-CM | POA: Diagnosis not present

## 2021-04-12 DIAGNOSIS — E1165 Type 2 diabetes mellitus with hyperglycemia: Secondary | ICD-10-CM | POA: Diagnosis not present

## 2021-04-12 DIAGNOSIS — E039 Hypothyroidism, unspecified: Secondary | ICD-10-CM | POA: Diagnosis not present

## 2021-04-12 DIAGNOSIS — M159 Polyosteoarthritis, unspecified: Secondary | ICD-10-CM | POA: Diagnosis not present

## 2021-04-12 DIAGNOSIS — R609 Edema, unspecified: Secondary | ICD-10-CM | POA: Diagnosis not present

## 2021-04-13 ENCOUNTER — Other Ambulatory Visit: Payer: Self-pay | Admitting: *Deleted

## 2021-04-13 NOTE — Patient Outreach (Signed)
[facility]) Care Management  [facility] Care Manager  04/13/2021   David Mathews 01/07/1941 [id number] RN Health Coach telephone call to patient.  Hipaa compliance verified. Per patient he is doing good. He has received his free style libre 2 and is checking his blood sugars 7-8 times a day. Patient stated he does have some problem with knocking his sensor off his arm, but he is working on that. Per patient his fasting blood sugar is 103. Patient is doing his own insulin adjusting. Patient is aware of the the Dr orders orders for his insulin. We have discussed what the Dr orders and what he is taking.  RN has discussed with patient about his frequent episodes of hypoglycemia. Patient stated that his pain medication is going to be decreased because he wants to have better control over his balance. He has not had any recent falls. Patient has agreed to follow up utreach calls.    Encounter Medications:  Outpatient Encounter Medications as of 04/13/2021  Medication Sig Note  . alfuzosin (UROXATRAL) 10 MG 24 hr tablet Take 10 mg by mouth daily.   [name] aspirin EC 81 MG tablet Take 81 mg by mouth daily. Swallow whole.   . cyclobenzaprine (FLEXERIL) 10 MG tablet Take 1 tablet (10 mg total) by mouth 2 (two) times daily as needed for muscle spasms.   . diclofenac Sodium (VOLTAREN) 1 % GEL Apply 2 g topically daily as needed (Knee pain).   [name] docusate sodium (COLACE) 100 MG capsule Take 1 capsule (100 mg total) by mouth 2 (two) times daily. (Patient not taking: Reported on 07/23/2020)   . famotidine (PEPCID) 40 MG tablet Take 40 mg by mouth at bedtime.    . ferrous sulfate (FERROUSUL) 325 (65 FE) MG tablet Take 1 tablet (325 mg total) by mouth 3 (three) times daily with meals for 14 days.   . finasteride (PROSCAR) 5 MG tablet Take 5 mg by mouth every evening.    . furosemide (LASIX) 20 MG tablet Take 20 mg by mouth daily.   [name] HYDROcodone-acetaminophen (NORCO) 5-325 MG tablet Take 1-2 tablets by  mouth every 6 (six) hours as needed for moderate pain or severe pain.   [name] insulin aspart (NOVOLOG FLEXPEN) 100 UNIT/ML FlexPen Inject 10-14 Units into the skin 3 (three) times daily with meals. PER SLIDING SCALE 01/14/2021: Per patient he takes 12-13 units  . Insulin Glargine (LANTUS SOLOSTAR) 100 UNIT/ML Solostar Pen Inject 55 Units into the skin daily. Per sliding scale (Patient taking differently: Inject 22 Units into the skin daily before breakfast. ) 01/14/2021: Per patient 20 units in am  . levothyroxine (SYNTHROID, LEVOTHROID) 88 MCG tablet Take 88 mcg by mouth daily before breakfast.   . lisinopril-hydrochlorothiazide (PRINZIDE,ZESTORETIC) 20-25 MG per tablet Take 1 tablet by mouth daily.    . Menthol, Topical Analgesic, (BIOFREEZE EX) Apply 1 application topically daily as needed (pain shoulder).    . metFORMIN (GLUCOPHAGE) 1000 MG tablet Take 1,000 mg by mouth 2 (two) times daily with a meal.   . Multiple Vitamins-Minerals (MULTIVITAMIN PO) Take 1 tablet by mouth daily. 50 +   . Naphazoline HCl (CLEAR EYES OP) Place 1 drop into both eyes daily as needed (irritation).   . polyethylene glycol (MIRALAX / GLYCOLAX) 17 g packet Take 17 g by mouth 2 (two) times daily. (Patient taking differently: Take 17 g by mouth daily as needed for mild constipation or moderate constipation. )   . Potassium 99 MG TABS Take 198  mg by mouth 2 (two) times daily.   . pregabalin (LYRICA) 150 MG capsule Take 150 mg by mouth daily.   . pregabalin (LYRICA) 225 MG capsule Take 225 mg by mouth at bedtime.    . sildenafil (VIAGRA) 100 MG tablet Take 100 mg by mouth daily as needed for erectile dysfunction.   . simvastatin (ZOCOR) 20 MG tablet Take 20 mg by mouth every evening.     No facility-administered encounter medications on file as of 04/13/2021.    Functional Status:  In your present state of health, do you have any difficulty performing the following activities: 07/28/2020  Hearing? N  Vision? N  Difficulty  concentrating or making decisions? N  Walking or climbing stairs? N  Dressing or bathing? N  Doing errands, shopping? N  Some recent data might be hidden    Fall/Depression Screening: Fall Risk  04/13/2021 01/14/2021 09/21/2020  Falls in the past year? 1 1 1   Comment - - -  Number falls in past yr: 1 1 1   Injury with Fall? 0 0 0  Risk for fall due to : History of fall(s);Impaired balance/gait;Impaired mobility History of fall(s);Impaired balance/gait;Impaired mobility History of fall(s);Impaired balance/gait;Impaired mobility  Follow up Falls evaluation completed Falls evaluation completed Falls evaluation completed   PHQ 2/9 Scores 04/13/2021 01/20/2020 09/12/2019 08/15/2019 08/02/2015  PHQ - 2 Score 0 0 0 0 0    Assessment:  Goals Addressed            This Visit's Progress   . (THN)Learn More About My Health   On track    Timeframe:  Long-Range Goal Priority:  Medium Start Date:      08/17/2019                       Expected End Date:   10/02/2015                   Follow up [id number] - ask questions - bring a list of my medicines to the visit - speak up when I don't understand    Why is this important?   The best way to learn about your health and care is by talking to the doctor and nurse.  They will answer your questions and give you information in the way that you like best.    Notes:  Patient is wanting to get a free style libre 2 [id number] Patient has received his free style and learning how to use it.    [id number] [facility] Eye Exam   On track    Timeframe:  Long-Range Goal Priority:  Medium Start Date: [name]                            Expected End Date:      [facility]                 Follow Up Date 54270623   - schedule appointment with eye doctor    Why is this important?   Eye check-ups are important when you have diabetes.  Vision loss can be prevented.    Notes:  Last eye exam [id number]    . (THN)Perform Foot Care   On track    Timeframe:  Long-Range  Goal Priority:  Medium Start Date:  76160737  Expected End Date:        99242683              Follow Up Date 41962229   - check feet daily for cuts, sores or redness - trim toenails straight across - wash and dry feet carefully every day - wear comfortable, cotton socks - wear comfortable, well-fitting shoes    Why is this important?   Good foot care is very important when you have diabetes.  There are many things you can do to keep your feet healthy and catch a problem early.    Notes:  01/14/2021 last podiatry visit 04/02/2020 [id number] patient is checking feet for cuts    . (THN)Set My Target A1C   On track    Timeframe:  Long-Range Goal Priority:  High Start Date: 4174081                            Expected End Date:       [id number]               Follow Up Date 49702637   - set target A1C    Why is this important?   Your target A1C is decided together by you and your doctor.  It is based on several things like your age and other health issues.    Notes:  7.0 7.9 A1C now  [id number] A1C is 7.1       Plan:  Follow-up:  Patient agrees to Care Plan and Follow-up. Patient will continue to work with free style libre 2 RN sent update assessment to PCP RN will follow up within the month of August Gean Maidens BSN RN [facility] Care Management [phone]

## 2021-04-13 NOTE — Patient Instructions (Signed)
Goals Addressed            This Visit's Progress   . (THN)Learn More About My Health   On track    Timeframe:  Long-Range Goal Priority:  Medium Start Date:      36644034                       Expected End Date:   74259563                   Follow up [id number] - ask questions - bring a list of my medicines to the visit - speak up when I don't understand    Why is this important?   The best way to learn about your health and care is by talking to the doctor and nurse.  They will answer your questions and give you information in the way that you like best.    Notes:  Patient is wanting to get a free style libre 2 [id number] Patient has received his free style and learning how to use it.    [name] [facility] Eye Exam   On track    Timeframe:  Long-Range Goal Priority:  Medium Start Date: 60630160                            Expected End Date:      10932355                 Follow Up Date 73220254   - schedule appointment with eye doctor    Why is this important?   Eye check-ups are important when you have diabetes.  Vision loss can be prevented.    Notes:  Last eye exam [id number]    . (THN)Perform Foot Care   On track    Timeframe:  Long-Range Goal Priority:  Medium Start Date:  28315176                           Expected End Date:        16073710              Follow Up Date 62694854   - check feet daily for cuts, sores or redness - trim toenails straight across - wash and dry feet carefully every day - wear comfortable, cotton socks - wear comfortable, well-fitting shoes    Why is this important?   Good foot care is very important when you have diabetes.  There are many things you can do to keep your feet healthy and catch a problem early.    Notes:  01/14/2021 last podiatry visit 04/02/2020 [id number] patient is checking feet for cuts    . (THN)Set My Target A1C   On track    Timeframe:  Long-Range Goal Priority:  High Start Date: 9381829                             Expected End Date:       [id number]               Follow Up Date 93810175   - set target A1C    Why is this important?   Your target A1C is decided together by you and your doctor.  It is based on several things like your age and other health issues.    Notes:  7.0 7.9 A1C now  [id number] A1C is 7.1

## 2021-04-27 DIAGNOSIS — M48061 Spinal stenosis, lumbar region without neurogenic claudication: Secondary | ICD-10-CM | POA: Diagnosis not present

## 2021-06-23 DIAGNOSIS — E785 Hyperlipidemia, unspecified: Secondary | ICD-10-CM | POA: Diagnosis not present

## 2021-06-23 DIAGNOSIS — Z1331 Encounter for screening for depression: Secondary | ICD-10-CM | POA: Diagnosis not present

## 2021-06-23 DIAGNOSIS — Z0000 Encounter for general adult medical examination without abnormal findings: Secondary | ICD-10-CM | POA: Diagnosis not present

## 2021-06-23 DIAGNOSIS — Z9181 History of falling: Secondary | ICD-10-CM | POA: Diagnosis not present

## 2021-07-05 ENCOUNTER — Other Ambulatory Visit: Payer: Self-pay | Admitting: *Deleted

## 2021-07-05 NOTE — Patient Outreach (Signed)
[facility]) Care Management  [facility] Care Manager  07/05/2021   David Mathews 02-06-1941 [id number]  RN Health Coach telephone call to patient.  Hipaa compliance verified. Per patient fasting blood sugar is 150. Patient is having 12-15 episodes of blood sugars below 50. He stated that he drinks protein drinks, peanut butter crackers and  peanut butter M & M's for his hypoglycemia. RN discussed glucose tablets and glucose gel. Per patient he had some tablets but don't have any more.   Patient has been self regulating his insulin. He is taking 8-10 units of Novolog. He has been taking 16 units of Lantus at night He stated he is decreasing it to 14 units. Patient  went to the [facility] hospital and will be returning on Monday 07/11/2021. He stated he will be having tests and talking with their pharmacist regulating and changing medications. Per patient he is not exercising. He is mowing multiple yards and wood projects that keep him very busy. He feels this is plenty of exercise. He is using a tens unit for the back pain. Patient has agreed to further outreach calls.    Encounter Medications:  Outpatient Encounter Medications as of 07/05/2021  Medication Sig Note   alfuzosin (UROXATRAL) 10 MG 24 hr tablet Take 10 mg by mouth daily.    aspirin EC 81 MG tablet Take 81 mg by mouth daily. Swallow whole.    cyclobenzaprine (FLEXERIL) 10 MG tablet Take 1 tablet (10 mg total) by mouth 2 (two) times daily as needed for muscle spasms.    diclofenac Sodium (VOLTAREN) 1 % GEL Apply 2 g topically daily as needed (Knee pain).    docusate sodium (COLACE) 100 MG capsule Take 1 capsule (100 mg total) by mouth 2 (two) times daily. (Patient not taking: Reported on 07/23/2020)    famotidine (PEPCID) 40 MG tablet Take 40 mg by mouth at bedtime.     ferrous sulfate (FERROUSUL) 325 (65 FE) MG tablet Take 1 tablet (325 mg total) by mouth 3 (three) times daily with meals for 14 days.    finasteride (PROSCAR) 5  MG tablet Take 5 mg by mouth every evening.     furosemide (LASIX) 20 MG tablet Take 20 mg by mouth daily.    HYDROcodone-acetaminophen (NORCO) 5-325 MG tablet Take 1-2 tablets by mouth every 6 (six) hours as needed for moderate pain or severe pain.    insulin aspart (NOVOLOG FLEXPEN) 100 UNIT/ML FlexPen Inject 10-14 Units into the skin 3 (three) times daily with meals. PER SLIDING SCALE 07/05/2021: Per patient he is taking 8-10 units   Insulin Glargine (LANTUS SOLOSTAR) 100 UNIT/ML Solostar Pen Inject 55 Units into the skin daily. Per sliding scale (Patient taking differently: Inject 22 Units into the skin daily before breakfast. ) 07/05/2021: Per patient he is taking 16 units at HS but will drop today to 14 units   levothyroxine (SYNTHROID, LEVOTHROID) 88 MCG tablet Take 88 mcg by mouth daily before breakfast.    lisinopril-hydrochlorothiazide (PRINZIDE,ZESTORETIC) 20-25 MG per tablet Take 1 tablet by mouth daily.  07/05/2021: Per patient he is taking 1/2  tablet per [facility] recommendations   Menthol, Topical Analgesic, (BIOFREEZE EX) Apply 1 application topically daily as needed (pain shoulder).     metFORMIN (GLUCOPHAGE) 1000 MG tablet Take 1,000 mg by mouth 2 (two) times daily with a meal.    Multiple Vitamins-Minerals (MULTIVITAMIN PO) Take 1 tablet by mouth daily. 50 +    Naphazoline HCl (CLEAR EYES OP) Place 1  drop into both eyes daily as needed (irritation).    polyethylene glycol (MIRALAX / GLYCOLAX) 17 g packet Take 17 g by mouth 2 (two) times daily. (Patient taking differently: Take 17 g by mouth daily as needed for mild constipation or moderate constipation. )    Potassium 99 MG TABS Take 198 mg by mouth 2 (two) times daily.    pregabalin (LYRICA) 150 MG capsule Take 150 mg by mouth daily.    pregabalin (LYRICA) 225 MG capsule Take 225 mg by mouth at bedtime.     sildenafil (VIAGRA) 100 MG tablet Take 100 mg by mouth daily as needed for erectile dysfunction.    simvastatin (ZOCOR) 20 MG tablet  Take 20 mg by mouth every evening.     No facility-administered encounter medications on file as of 07/05/2021.    Functional Status:  In your present state of health, do you have any difficulty performing the following activities: 07/28/2020  Hearing? N  Vision? N  Difficulty concentrating or making decisions? N  Walking or climbing stairs? N  Dressing or bathing? N  Doing errands, shopping? N  Some recent data might be hidden    Fall/Depression Screening: Fall Risk  04/13/2021 01/14/2021 09/21/2020  Falls in the past year? 1 1 1   Comment - - -  Number falls in past yr: 1 1 1   Injury with Fall? 0 0 0  Risk for fall due to : History of fall(s);Impaired balance/gait;Impaired mobility History of fall(s);Impaired balance/gait;Impaired mobility History of fall(s);Impaired balance/gait;Impaired mobility  Follow up Falls evaluation completed Falls evaluation completed Falls evaluation completed   PHQ 2/9 Scores 04/13/2021 01/20/2020 09/12/2019 08/15/2019 08/02/2015  PHQ - 2 Score 0 0 0 0 0    Assessment:   Care Plan Care Plan : Diabetes Type 2 (Adult)  Updates made by 08/17/2019, RN since 07/05/2021 12:00 AM     Problem: Glycemic Management (Diabetes, Type 2)   Priority: High  Onset Date: 09/21/2020     Long-Range Goal: Glycemic Management Optimized   Start Date: 09/21/2020  Expected End Date: 11/18/2021  This Visit's Progress: On track  Recent Progress: On track  Priority: High  Note:   Evidence-based guidance:  Anticipate A1C testing (point-of-care) every 3 to 6 months based on goal attainment.  Review mutually-set A1C goal or target range.  Anticipate use of antihyperglycemic with or without insulin and periodic adjustments; consider active involvement of pharmacist.  Provide medical nutrition therapy and development of individualized eating.  Compare self-reported symptoms of hypo or hyperglycemia to blood glucose levels, diet and fluid intake, current medications,  psychosocial and physiologic stressors, change in activity and barriers to care adherence.  Promote self-monitoring of blood glucose levels.  Assess and address barriers to management plan, such as food insecurity, age, developmental ability, depression, anxiety, fear of hypoglycemia or weight gain, as well as medication cost, side effects and complicated regimen.  Consider referral to community-based diabetes education program, visiting nurse, community health worker or health coach.  Encourage regular dental care for treatment of periodontal disease; refer to dental provider when needed.   Notes:  13/12/2019 Referred to pharmacy for a free style libre2. Patient A1C has decreased to 7.9     Task: Alleviate Barriers to Glycemic Management   Due Date: 11/18/2021  Note:   Care Management Activities:    - blood glucose monitoring encouraged - blood glucose readings reviewed - mutual A1C goal set or reviewed - self-awareness of signs/symptoms of hypo or hyperglycemia encouraged -  use of blood glucose monitoring log promoted    Notes:  Patient is not actually keeping a record he is giving the range of blood sugars. He is having episodes of hypoglycemia of 30's [id number] Patient is having episodes of hypoglycemia 12-15 times a week    Problem: Disease Progression (Diabetes, Type 2)   Priority: Medium  Onset Date: 09/21/2020     Long-Range Goal: Disease Progression Prevented or Minimized   Start Date: 09/21/2020  Expected End Date: 11/18/2021  This Visit's Progress: On track  Recent Progress: On track  Priority: Medium  Note:   Evidence-based guidance:  Prepare patient for laboratory and diagnostic exams based on risk and presentation.  Encourage lifestyle changes, such as increased intake of plant-based foods, stress reduction, consistent physical activity and smoking cessation to prevent long-term complications and chronic disease.   Individualize activity and exercise recommendations  while considering potential limitations, such as neuropathy, retinopathy or the ability to prevent hyperglycemia or hypoglycemia.   Prepare patient for use of pharmacologic therapy that may include antihypertensive, analgesic, prostaglandin E1 with periodic adjustments, based on presenting chronic condition and laboratory results.  Assess signs/symptoms and risk factors for hypertension, sleep-disordered breathing, neuropathy (including changes in gait and balance), retinopathy, nephropathy and sexual dysfunction.  Address pregnancy planning and contraceptive choice, especially when prescribing antihypertensive or statin.  Ensure completion of annual comprehensive foot exam and dilated eye exam.   Implement additional individualized goals and interventions based on identified risk factors.  Prepare patient for consultation or referral for specialist care, such as ophthalmology, neurology, cardiology, podiatry, nephrology or perinatology.   Notes:  Patient will follow up with looking for a gyn      Goals Addressed             This Visit's Progress    (THN)Learn More About My Health   On track    Timeframe:  Long-Range Goal Priority:  Medium Start Date:      [id number]                       Expected End Date:   [id number]                   Follow up [id number] - ask questions - bring a list of my medicines to the visit - speak up when I don't understand    Why is this important?   The best way to learn about your health and care is by talking to the doctor and nurse.  They will answer your questions and give you information in the way that you like best.    Notes:  Patient is wanting to get a free style libre 2 [id number] Patient has received his free style and learning how to use it. [id number] Patient is learning how to handle his low blood sugars better     [facility](THN)Obtain Eye Exam   On track    Timeframe:  Long-Range Goal Priority:  Medium Start Date: [id number]                             Expected End Date:      [id number]                 Follow Up Date [id number]   - schedule appointment with eye doctor    Why is this important?   Eye check-ups are important when you have diabetes.  Vision loss  can be prevented.    Notes:  Last eye exam [id number] Patient stated he had went in March     [facility] Foot Care   On track    Timeframe:  Long-Range Goal Priority:  Medium Start Date:  53664403                           Expected End Date:        47425956              Follow Up Date 38756433   - check feet daily for cuts, sores or redness - trim toenails straight across - wash and dry feet carefully every day - wear comfortable, cotton socks - wear comfortable, well-fitting shoes    Why is this important?   Good foot care is very important when you have diabetes.  There are many things you can do to keep your feet healthy and catch a problem early.    Notes:  01/14/2021 last podiatry visit 04/02/2020 [id number] patient is checking feet for cuts 0011001100 Patient is monitoring blood sugars     (THN)Set My Target A1C   On track    Timeframe:  Long-Range Goal Priority:  High Start Date: 6606301                            Expected End Date:       601093235               Follow Up Date 57322025   - set target A1C    Why is this important?   Your target A1C is decided together by you and your doctor.  It is based on several things like your age and other health issues.    Notes:  7.0 7.9 A1C now  [id number] A1C is 7.1 62831517 Patient A1C is 7.1        Plan:  Follow-up: Follow-up in 3 month(s) RN sent Guide for Journey with diabetes RN discussed patient insulin dosages Patient is going to the [place] Patient will continue to self regulate insulin despite Dr orders Patient will send update assessment to PCP  Gean Maidens BSN RN [facility] Care Management [phone]

## 2021-07-05 NOTE — Patient Instructions (Signed)
Goals Addressed             This Visit's Progress    (THN)Learn More About My Health   On track    Timeframe:  Long-Range Goal Priority:  Medium Start Date:      30865784                       Expected End Date:   69629528                   Follow up [id number] - ask questions - bring a list of my medicines to the visit - speak up when I don't understand    Why is this important?   The best way to learn about your health and care is by talking to the doctor and nurse.  They will answer your questions and give you information in the way that you like best.    Notes:  Patient is wanting to get a free style libre 2 [id number] Patient has received his free style and learning how to use it. [id number] Patient is learning how to handle his low blood sugars better     [facility] Eye Exam   On track    Timeframe:  Long-Range Goal Priority:  Medium Start Date: 59563875                            Expected End Date:      64332951                 Follow Up Date 88416606   - schedule appointment with eye doctor    Why is this important?   Eye check-ups are important when you have diabetes.  Vision loss can be prevented.    Notes:  Last eye exam [id number] Patient stated he had went in March     [facility] Foot Care   On track    Timeframe:  Long-Range Goal Priority:  Medium Start Date:  20254270                           Expected End Date:        62376283              Follow Up Date 15176160   - check feet daily for cuts, sores or redness - trim toenails straight across - wash and dry feet carefully every day - wear comfortable, cotton socks - wear comfortable, well-fitting shoes    Why is this important?   Good foot care is very important when you have diabetes.  There are many things you can do to keep your feet healthy and catch a problem early.    Notes:  01/14/2021 last podiatry visit 04/02/2020 73710626 patient is checking feet for cuts 0011001100 Patient  is monitoring blood sugars     (THN)Set My Target A1C   On track    Timeframe:  Long-Range Goal Priority:  High Start Date: 9485462                            Expected End Date:       703500938               Follow Up Date 18299371   - set target A1C    Why is this important?   Your target A1C  is decided together by you and your doctor.  It is based on several things like your age and other health issues.    Notes:  7.0 7.9 A1C now  [id number] A1C is 7.1 27517001 Patient A1C is 7.1

## 2021-07-21 ENCOUNTER — Other Ambulatory Visit: Payer: Self-pay | Admitting: Neurosurgery

## 2021-07-21 DIAGNOSIS — M48061 Spinal stenosis, lumbar region without neurogenic claudication: Secondary | ICD-10-CM

## 2021-07-21 DIAGNOSIS — Z6827 Body mass index (BMI) 27.0-27.9, adult: Secondary | ICD-10-CM | POA: Diagnosis not present

## 2021-08-16 ENCOUNTER — Other Ambulatory Visit: Payer: Self-pay

## 2021-08-16 ENCOUNTER — Ambulatory Visit
Admission: RE | Admit: 2021-08-16 | Discharge: 2021-08-16 | Disposition: A | Discharge status: Home / Self Care | Payer: Medicare Other | Source: Ambulatory Visit | Attending: Neurosurgery | Admitting: Neurosurgery

## 2021-08-16 DIAGNOSIS — M48061 Spinal stenosis, lumbar region without neurogenic claudication: Secondary | ICD-10-CM

## 2021-08-16 DIAGNOSIS — M4804 Spinal stenosis, thoracic region: Secondary | ICD-10-CM | POA: Diagnosis not present

## 2021-08-16 DIAGNOSIS — Q7649 Other congenital malformations of spine, not associated with scoliosis: Secondary | ICD-10-CM | POA: Diagnosis not present

## 2021-08-16 DIAGNOSIS — K449 Diaphragmatic hernia without obstruction or gangrene: Secondary | ICD-10-CM | POA: Diagnosis not present

## 2021-08-16 DIAGNOSIS — M5134 Other intervertebral disc degeneration, thoracic region: Secondary | ICD-10-CM | POA: Diagnosis not present

## 2021-08-16 DIAGNOSIS — M545 Low back pain, unspecified: Secondary | ICD-10-CM | POA: Diagnosis not present

## 2021-08-30 DIAGNOSIS — M159 Polyosteoarthritis, unspecified: Secondary | ICD-10-CM | POA: Diagnosis not present

## 2021-08-30 DIAGNOSIS — N529 Male erectile dysfunction, unspecified: Secondary | ICD-10-CM | POA: Diagnosis not present

## 2021-08-30 DIAGNOSIS — Z23 Encounter for immunization: Secondary | ICD-10-CM | POA: Diagnosis not present

## 2021-08-30 DIAGNOSIS — E1165 Type 2 diabetes mellitus with hyperglycemia: Secondary | ICD-10-CM | POA: Diagnosis not present

## 2021-08-30 DIAGNOSIS — Z139 Encounter for screening, unspecified: Secondary | ICD-10-CM | POA: Diagnosis not present

## 2021-08-30 DIAGNOSIS — Z79899 Other long term (current) drug therapy: Secondary | ICD-10-CM | POA: Diagnosis not present

## 2021-08-30 DIAGNOSIS — I10 Essential (primary) hypertension: Secondary | ICD-10-CM | POA: Diagnosis not present

## 2021-08-30 DIAGNOSIS — M545 Low back pain, unspecified: Secondary | ICD-10-CM | POA: Diagnosis not present

## 2021-08-30 DIAGNOSIS — E7800 Pure hypercholesterolemia, unspecified: Secondary | ICD-10-CM | POA: Diagnosis not present

## 2021-08-30 DIAGNOSIS — N401 Enlarged prostate with lower urinary tract symptoms: Secondary | ICD-10-CM | POA: Diagnosis not present

## 2021-08-30 DIAGNOSIS — E039 Hypothyroidism, unspecified: Secondary | ICD-10-CM | POA: Diagnosis not present

## 2021-08-30 DIAGNOSIS — R609 Edema, unspecified: Secondary | ICD-10-CM | POA: Diagnosis not present

## 2021-09-01 DIAGNOSIS — E039 Hypothyroidism, unspecified: Secondary | ICD-10-CM | POA: Diagnosis not present

## 2021-09-01 DIAGNOSIS — E7800 Pure hypercholesterolemia, unspecified: Secondary | ICD-10-CM | POA: Diagnosis not present

## 2021-09-01 DIAGNOSIS — M48061 Spinal stenosis, lumbar region without neurogenic claudication: Secondary | ICD-10-CM | POA: Diagnosis not present

## 2021-09-01 DIAGNOSIS — Z79899 Other long term (current) drug therapy: Secondary | ICD-10-CM | POA: Diagnosis not present

## 2021-09-07 DIAGNOSIS — H353131 Nonexudative age-related macular degeneration, bilateral, early dry stage: Secondary | ICD-10-CM | POA: Diagnosis not present

## 2021-09-07 DIAGNOSIS — E119 Type 2 diabetes mellitus without complications: Secondary | ICD-10-CM | POA: Diagnosis not present

## 2021-09-07 DIAGNOSIS — H47323 Drusen of optic disc, bilateral: Secondary | ICD-10-CM | POA: Diagnosis not present

## 2021-09-28 DIAGNOSIS — M159 Polyosteoarthritis, unspecified: Secondary | ICD-10-CM | POA: Diagnosis not present

## 2021-09-28 DIAGNOSIS — Z7960 Long term (current) use of unspecified immunomodulators and immunosuppressants: Secondary | ICD-10-CM | POA: Diagnosis not present

## 2021-09-28 DIAGNOSIS — M154 Erosive (osteo)arthritis: Secondary | ICD-10-CM | POA: Diagnosis not present

## 2021-09-28 DIAGNOSIS — R768 Other specified abnormal immunological findings in serum: Secondary | ICD-10-CM | POA: Diagnosis not present

## 2021-09-28 DIAGNOSIS — M5136 Other intervertebral disc degeneration, lumbar region: Secondary | ICD-10-CM | POA: Diagnosis not present

## 2021-10-04 ENCOUNTER — Other Ambulatory Visit: Payer: Self-pay | Admitting: *Deleted

## 2021-10-04 NOTE — Patient Outreach (Signed)
[facility]) Care Management RN Health Coach Note   10/04/2021 Name:  David Mathews MRN:  [id number] DOB:  Apr 26, 1941  Summary: Patient Blood sugar is 145. It was not fasting.  Patient A1C 6.8.  Per patient he is not exercising.  Patient has discussed medication changes. Patient has received his flu shot.   Recommendations/Changes made from today's visit: RN recommends patient takes medication as per ordered.  RN discussed healthy eating    Subjective: David Mathews is an 80 y.o. year old male who is a primary patient of Lonie Peak, [place]. The care management team was consulted for assistance with care management and/or care coordination needs.    RN Health Coach completed Telephone Visit today.   Objective:  Medications Reviewed Today     Reviewed by Luella Cook, RN (Case Manager) on 10/04/21 at 1112  Med List Status: <None>   Medication Order Taking? Sig Documenting Provider Last Dose Status Informant  alfuzosin (UROXATRAL) 10 MG 24 hr tablet [id number]  Take 10 mg by mouth daily. [provider]  Active Self  aspirin EC 81 MG tablet [id number]  Take 81 mg by mouth daily. Swallow whole. [provider]  Active Self  cyclobenzaprine (FLEXERIL) 10 MG tablet [id number]  Take 1 tablet (10 mg total) by mouth 2 (two) times daily as needed for muscle spasms. Lanney Gins, PA-C  Active Self  diclofenac Sodium (VOLTAREN) 1 % GEL [id number]  Apply 2 g topically daily as needed (Knee pain). [provider]  Active Self  docusate sodium (COLACE) 100 MG capsule [id number]  Take 1 capsule (100 mg total) by mouth 2 (two) times daily.  Patient not taking: Reported on 07/23/2020   Lanney Gins, PA-C  Active Self  famotidine (PEPCID) 40 MG tablet [id number]  Take 40 mg by mouth at bedtime.  [provider]  Active Self  ferrous sulfate (FERROUSUL) 325 (65 FE) MG tablet [id number]  Take 1 tablet (325 mg total) by mouth 3 (three)  times daily with meals for 14 days. Lanney Gins, PA-C  Expired 07/30/20 2359   finasteride (PROSCAR) 5 MG tablet [id number]  Take 5 mg by mouth every evening.  [provider]  Active Self  furosemide (LASIX) 20 MG tablet [id number] Yes Take 20 mg by mouth daily. [provider] Taking Active Self           Med Note Luella Cook   Tue Oct 04, 2021 10:53 AM) Per patient was D/CD  HYDROcodone-acetaminophen (NORCO) 5-325 MG tablet [id number]  Take 1-2 tablets by mouth every 6 (six) hours as needed for moderate pain or severe pain. Lanney Gins, PA-C  Active Self  insulin aspart (NOVOLOG FLEXPEN) 100 UNIT/ML FlexPen [id number]  Inject 10-14 Units into the skin 3 (three) times daily with meals. PER SLIDING SCALE [provider]  Active Self           Med Note Ian Malkin, Dennard Schaumann   Tue Jul 05, 2021 11:10 AM) Per patient he is taking 8-10 units  Insulin Glargine (LANTUS SOLOSTAR) 100 UNIT/ML Solostar Pen [id number]  Inject 55 Units into the skin daily. Per sliding scale  Patient taking differently: Inject 22 Units into the skin daily before breakfast.    Leroy Sea, MD  Active Self           Med Note Ian Malkin, Dennard Schaumann   Tue Jul 05, 2021 11:11 AM) Per patient he is taking 16 units at [facility] but will drop  today to 14 units  levothyroxine (SYNTHROID, LEVOTHROID) 88 MCG tablet [id number]  Take 88 mcg by mouth daily before breakfast. [provider]  Active Self  lisinopril (ZESTRIL) 20 MG tablet [id number] Yes Take 20 mg by mouth daily. [provider] Taking Active Self  lisinopril-hydrochlorothiazide (PRINZIDE,ZESTORETIC) 20-25 MG per tablet [id number] No Take 1 tablet by mouth daily.   Patient not taking: Reported on 10/04/2021   [provider] Not Taking Active Self           Med Note Luella Cook   Tue Jul 05, 2021 11:14 AM) Per patient he is taking 1/2  [facility]  Menthol, Topical Analgesic, (BIOFREEZE  EX) [id number]  Apply 1 application topically daily as needed (pain shoulder).  [provider]  Active Self  metFORMIN (GLUCOPHAGE) 1000 MG tablet [id number]  Take 1,000 mg by mouth 2 (two) times daily with a meal. [provider]  Active Self           Med Note Luella Cook   Tue Oct 04, 2021 11:07 AM) Metformin and Jardiance 1000/5 2 tablets qd  Multiple Vitamins-Minerals (MULTIVITAMIN PO) [id number]  Take 1 tablet by mouth daily. 50 + [provider]  Active Self  Naphazoline HCl (CLEAR EYES OP) [id number]  Place 1 drop into both eyes daily as needed (irritation). [provider]  Active Self  polyethylene glycol (MIRALAX / GLYCOLAX) 17 g packet [id number]  Take 17 g by mouth 2 (two) times daily.  Patient taking differently: Take 17 g by mouth daily as needed for mild constipation or moderate constipation.    Lanney Gins, PA-C  Active Self  Potassium 99 MG TABS [id number]  Take 198 mg by mouth 2 (two) times daily. [provider]  Active Self  pregabalin (LYRICA) 150 MG capsule [id number]  Take 150 mg by mouth daily. [provider]  Active Self  pregabalin (LYRICA) 225 MG capsule [id number]  Take 225 mg by mouth at bedtime.  [provider]  Active Self           Med Note Helane Rima Apr 10, 2016  9:18 AM)        Semaglutide (OZEMPIC, 0.25 OR 0.5 MG/DOSE, Conrad) [id number] Yes Inject 0.5 mg into the skin once a week. Friday [provider] Taking Active   sildenafil (VIAGRA) 100 MG tablet [id number]  Take 100 mg by mouth daily as needed for erectile dysfunction. [provider]  Active Self  simvastatin (ZOCOR) 20 MG tablet [id number]  Take 20 mg by mouth every evening.  [provider]  Active Self             SDOH:  (Social Determinants of Health) assessments and interventions performed:  SDOH Interventions    Flowsheet Row Most Recent Value  SDOH Interventions   Food Insecurity  Interventions Intervention Not Indicated  Housing Interventions Intervention Not Indicated       Care Plan  Review of patient past medical history, allergies, medications, health status, including review of consultants reports, laboratory and other test data, was performed as part of comprehensive evaluation for care management services.   Care Plan : Diabetes Type 2 (Adult)  Updates made by Luella Cook, RN since 10/04/2021 12:00 AM     Problem: Glycemic Management (Diabetes, Type 2) Resolved 10/04/2021  Priority: High  Onset Date: 09/21/2020  Note:   Resolving due to duplicate goal    Long-Range Goal: Glycemic Management  Optimized Completed 10/04/2021  Start Date: 09/21/2020  Expected End Date: 11/18/2021  Recent Progress: On track  Priority: High  Note:   Evidence-based guidance:  Anticipate A1C testing (point-of-care) every 3 to 6 months based on goal attainment.  Review mutually-set A1C goal or target range.  Anticipate use of antihyperglycemic with or without insulin and periodic adjustments; consider active involvement of pharmacist.  Provide medical nutrition therapy and development of individualized eating.  Compare self-reported symptoms of hypo or hyperglycemia to blood glucose levels, diet and fluid intake, current medications, psychosocial and physiologic stressors, change in activity and barriers to care adherence.  Promote self-monitoring of blood glucose levels.  Assess and address barriers to management plan, such as food insecurity, age, developmental ability, depression, anxiety, fear of hypoglycemia or weight gain, as well as medication cost, side effects and complicated regimen.  Consider referral to community-based diabetes education program, visiting nurse, community health worker or health coach.  Encourage regular dental care for treatment of periodontal disease; refer to dental provider when needed.   Notes:  [id number] Referred to pharmacy for a  free style libre2. Patient A1C has decreased to 7.9     Task: Alleviate Barriers to Glycemic Management Completed 10/04/2021  Due Date: 11/18/2021  Note:   Care Management Activities:    - blood glucose monitoring encouraged - blood glucose readings reviewed - mutual A1C goal set or reviewed - self-awareness of signs/symptoms of hypo or hyperglycemia encouraged - use of blood glucose monitoring log promoted    Notes:  Patient is not actually keeping a record he is giving the range of blood sugars. He is having episodes of hypoglycemia of 30's [id number] Patient is having episodes of hypoglycemia 12-15 times a week    Problem: Disease Progression (Diabetes, Type 2)   Priority: Medium  Onset Date: 09/21/2020     Long-Range Goal: Disease Progression Prevented or Minimized Completed 10/04/2021  Start Date: 09/21/2020  Expected End Date: 11/18/2021  Recent Progress: On track  Priority: Medium  Note:   Resolving due to duplicate goal Evidence-based guidance:  Prepare patient for laboratory and diagnostic exams based on risk and presentation.  Encourage lifestyle changes, such as increased intake of plant-based foods, stress reduction, consistent physical activity and smoking cessation to prevent long-term complications and chronic disease.   Individualize activity and exercise recommendations while considering potential limitations, such as neuropathy, retinopathy or the ability to prevent hyperglycemia or hypoglycemia.   Prepare patient for use of pharmacologic therapy that may include antihypertensive, analgesic, prostaglandin E1 with periodic adjustments, based on presenting chronic condition and laboratory results.  Assess signs/symptoms and risk factors for hypertension, sleep-disordered breathing, neuropathy (including changes in gait and balance), retinopathy, nephropathy and sexual dysfunction.  Address pregnancy planning and contraceptive choice, especially when prescribing  antihypertensive or statin.  Ensure completion of annual comprehensive foot exam and dilated eye exam.   Implement additional individualized goals and interventions based on identified risk factors.  Prepare patient for consultation or referral for specialist care, such as ophthalmology, neurology, cardiology, podiatry, nephrology or perinatology.   Notes:  Patient will follow up with looking for a gyn    Task: Monitor and Manage Follow-up for Comorbidities Completed 10/04/2021  Due Date: 11/18/2021  Note:   Care Management Activities:    - healthy lifestyle promoted - reduction of sedentary activity encouraged - signs/symptoms of comorbidities identified    Notes:  [id number] encouraged patient to find a gym    Care Plan : RN Care Manager Plan Of  Care  Updates made by , Dennard Schaumann, RN since 10/04/2021 12:00 AM     Problem: Knowledge Deficit Related to Diabetes and Care Coordination needs   Priority: High     Long-Range Goal: Developement Plan of Care for Management of Diabetes   Start Date: 10/04/2021  Expected End Date: 11/18/2022  Priority: High  Note:   Current Barriers:  Knowledge Deficits related to plan of care for management of DMII   RNCM Clinical Goal(s):  Patient will verbalize understanding of plan for management of DMII as evidenced by A1C 6.5 or <. take all medications exactly as prescribed and will call provider for medication related questions as evidenced by continuation of monitoring blood sugars and adhering to diabetic diet    through collaboration with RN Care manager, provider, and care team.   Interventions: Inter-disciplinary care team collaboration (see longitudinal plan of care) Evaluation of current treatment plan related to  self management and patient's adherence to plan as established by provider    Patient Goals/Self-Care Activities: Patient will self administer medications as prescribed as evidenced by self report/primary caregiver  report  Patient will attend all scheduled provider appointments as evidenced by clinician review of documented attendance to scheduled appointments and patient/caregiver report Patient will call pharmacy for medication refills as evidenced by patient report and review of pharmacy fill history as appropriate Patient will continue to perform ADL's independently as evidenced by patient/caregiver report Patient will continue to perform IADL's independently as evidenced by patient/caregiver report Patient will call provider office for new concerns or questions as evidenced by review of documented incoming telephone call notes and patient report schedule appointment with eye doctor check blood sugar at prescribed times: 4 times daily and when you have symptoms of low or high blood sugar take the blood sugar log to all doctor visits take the blood sugar meter to all doctor visits trim toenails straight across set a realistic goal        Plan: Telephone follow up appointment with care management team member scheduled for:  February The patient has been provided with contact information for the care management team and has been advised to call with any health related questions or concerns.   Gean Maidens BSN RN [facility] Care Management [phone]

## 2021-10-04 NOTE — Patient Instructions (Signed)
Visit Information  Current Barriers:  Knowledge Deficits related to plan of care for management of DMII   RNCM Clinical Goal(s):  Patient will verbalize understanding of plan for management of DMII as evidenced by A1C 6.5 or <. take all medications exactly as prescribed and will call provider for medication related questions as evidenced by continuation of monitoring blood sugars and adhering to diabetic diet    through collaboration with RN Care manager, provider, and care team.   Interventions: Inter-disciplinary care team collaboration (see longitudinal plan of care) Evaluation of current treatment plan related to  self management and patient's adherence to plan as established by provider    Patient Goals/Self-Care Activities: Patient will self administer medications as prescribed as evidenced by self report/primary caregiver report  Patient will attend all scheduled provider appointments as evidenced by clinician review of documented attendance to scheduled appointments and patient/caregiver report Patient will call pharmacy for medication refills as evidenced by patient report and review of pharmacy fill history as appropriate Patient will continue to perform ADL's independently as evidenced by patient/caregiver report Patient will continue to perform IADL's independently as evidenced by patient/caregiver report Patient will call provider office for new concerns or questions as evidenced by review of documented incoming telephone call notes and patient report schedule appointment with eye doctor check blood sugar at prescribed times: 4 times daily and when you have symptoms of low or high blood sugar take the blood sugar log to all doctor visits take the blood sugar meter to all doctor visits trim toenails straight across set a realistic goal    The patient verbalized understanding of instructions, educational materials, and care plan provided today and agreed to receive a mailed  copy of patient instructions, educational materials, and care plan.   Telephone follow up appointment with care management team member scheduled for: The patient has been provided with contact information for the care management team and has been advised to call with any health related questions or concerns.   SIGNATURE  Gean Maidens BSN RN [facility] Care Management [phone]

## 2021-11-30 DIAGNOSIS — N401 Enlarged prostate with lower urinary tract symptoms: Secondary | ICD-10-CM | POA: Diagnosis not present

## 2021-11-30 DIAGNOSIS — Z6826 Body mass index (BMI) 26.0-26.9, adult: Secondary | ICD-10-CM | POA: Diagnosis not present

## 2021-11-30 DIAGNOSIS — M545 Low back pain, unspecified: Secondary | ICD-10-CM | POA: Diagnosis not present

## 2021-11-30 DIAGNOSIS — E7800 Pure hypercholesterolemia, unspecified: Secondary | ICD-10-CM | POA: Diagnosis not present

## 2021-11-30 DIAGNOSIS — M159 Polyosteoarthritis, unspecified: Secondary | ICD-10-CM | POA: Diagnosis not present

## 2021-11-30 DIAGNOSIS — E039 Hypothyroidism, unspecified: Secondary | ICD-10-CM | POA: Diagnosis not present

## 2021-11-30 DIAGNOSIS — I10 Essential (primary) hypertension: Secondary | ICD-10-CM | POA: Diagnosis not present

## 2021-11-30 DIAGNOSIS — E1165 Type 2 diabetes mellitus with hyperglycemia: Secondary | ICD-10-CM | POA: Diagnosis not present

## 2021-12-01 DIAGNOSIS — M48061 Spinal stenosis, lumbar region without neurogenic claudication: Secondary | ICD-10-CM | POA: Diagnosis not present

## 2022-01-09 ENCOUNTER — Other Ambulatory Visit: Payer: Self-pay | Admitting: *Deleted

## 2022-01-09 NOTE — Patient Outreach (Signed)
[facility]) Care Management  01/09/2022  David Mathews 1941-01-24 [id number]  RN Health Coach telephone call to patient.  [employer] compliance verified. [place] Patient requested that he would like for a call back.   [facility] Management [phone]

## 2022-01-16 ENCOUNTER — Other Ambulatory Visit: Payer: Self-pay | Admitting: *Deleted

## 2022-01-16 NOTE — Patient Instructions (Signed)
Visit Information  Thank you for taking time to visit with me today. Please don't hesitate to contact me if I can be of assistance to you before our next scheduled telephone appointment.  Following are the goals we discussed today:  Current Barriers:  Knowledge Deficits related to plan of care for management of DMII   RNCM Clinical Goal(s):  Patient will verbalize understanding of plan for management of DMII as evidenced by A1C 6.5 or <. take all medications exactly as prescribed and will call provider for medication related questions as evidenced by continuation of monitoring blood sugars and adhering to diabetic diet    through collaboration with RN Care manager, provider, and care team.   Interventions: Inter-disciplinary care team collaboration (see longitudinal plan of care) Evaluation of current treatment plan related to  self management and patient's adherence to plan as established by provider Medication adherence    Patient Goals/Self-Care Activities: Take medications as prescribed   Attend all scheduled provider appointments Call pharmacy for medication refills 3-7 days in advance of running out of medications Perform all self care activities independently  Perform IADL's (shopping, preparing meals, housekeeping, managing finances) independently Call provider office for new concerns or questions  call the Suicide and Crisis Lifeline: 988 if experiencing a Mental Health or [place]  check blood sugar at prescribed times: 4 times daily and when you have symptoms of low or high blood sugar check feet daily for cuts, sores or redness take the blood sugar log to all doctor visits take the blood sugar meter to all doctor visits trim toenails straight across set a realistic goal    01/16/2022 Pt is taking glargine 18 units instead of 16 units. His A1C has increased from 6.8 to 7.9. He has a free style libre and monitors 3-4 times or more a day. No episodes of  hypoglycemia since last outreach. Per patient he is still currently having back pain. Patient will be out of town until May. He will see [facility] in June. A1C to be drawn in May.  Our next appointment is by telephone on June 20213  Please call Johny Shock RN at [phone] if you need to cancel or reschedule your appointment.   Please call the Suicide and Crisis Lifeline: 988 if you are experiencing a Mental Health or Mathews or need someone to talk to.  The patient verbalized understanding of instructions, educational materials, and care plan provided today and agreed to receive a mailed copy of patient instructions, educational materials, and care plan.   Telephone follow up appointment with care management team member scheduled for: The patient has been provided with contact information for the care management team and has been advised to call with any health related questions or concerns.   [facility] Care Management [phone]

## 2022-01-16 NOTE — Patient Outreach (Signed)
[facility]) Care Management RN Health Coach Note   01/16/2022 Name:  David Mathews MRN:  [id number] DOB:  1941/03/30  Summary: Patient fasting blood sugar this Am was 113. A1C is 7.9. Patient is taking Glargine 18 units instead of 16 units. No recent falls. Patient is still having back pain. He has been keeping all appointments including the [place].   Recommendations/Changes made from today's visit: Medication adherence Monitor for hypoglycemia symptoms Monitor blood sugars as per ordered   Subjective: David Mathews is an 81 y.o. year old male who is a primary patient of Lonie Peak, [place]. The care management team was consulted for assistance with care management and/or care coordination needs.    RN Health Coach completed Telephone Visit today.   Objective:  Medications Reviewed Today     Reviewed by Luella Cook, RN (Case Manager) on 10/04/21 at 1112  Med List Status: <None>   Medication Order Taking? Sig Documenting Provider Last Dose Status Informant  alfuzosin (UROXATRAL) 10 MG 24 hr tablet [id number]  Take 10 mg by mouth daily. [provider]  Active Self  aspirin EC 81 MG tablet [id number]  Take 81 mg by mouth daily. Swallow whole. [provider]  Active Self  cyclobenzaprine (FLEXERIL) 10 MG tablet [id number]  Take 1 tablet (10 mg total) by mouth 2 (two) times daily as needed for muscle spasms. Lanney Gins, PA-C  Active Self  diclofenac Sodium (VOLTAREN) 1 % GEL [id number]  Apply 2 g topically daily as needed (Knee pain). [provider]  Active Self  docusate sodium (COLACE) 100 MG capsule [id number]  Take 1 capsule (100 mg total) by mouth 2 (two) times daily.  Patient not taking: Reported on 07/23/2020   Lanney Gins, PA-C  Active Self  famotidine (PEPCID) 40 MG tablet [id number]  Take 40 mg by mouth at bedtime.  [provider]  Active Self  ferrous sulfate (FERROUSUL) 325 (65 FE) MG tablet [id number]   Take 1 tablet (325 mg total) by mouth 3 (three) times daily with meals for 14 days. Lanney Gins, PA-C  Expired 07/30/20 2359   finasteride (PROSCAR) 5 MG tablet [id number]  Take 5 mg by mouth every evening.  [provider]  Active Self  furosemide (LASIX) 20 MG tablet [id number] Yes Take 20 mg by mouth daily. [provider] Taking Active Self           Med Note Luella Cook   Tue Oct 04, 2021 10:53 AM) Per patient was D/CD  HYDROcodone-acetaminophen (NORCO) 5-325 MG tablet [id number]  Take 1-2 tablets by mouth every 6 (six) hours as needed for moderate pain or severe pain. Lanney Gins, PA-C  Active Self  insulin aspart (NOVOLOG FLEXPEN) 100 UNIT/ML FlexPen [id number]  Inject 10-14 Units into the skin 3 (three) times daily with meals. PER SLIDING SCALE [provider]  Active Self           Med Note Ian Malkin, Dennard Schaumann   Tue Jul 05, 2021 11:10 AM) Per patient he is taking 8-10 units  Insulin Glargine (LANTUS SOLOSTAR) 100 UNIT/ML Solostar Pen [id number]  Inject 55 Units into the skin daily. Per sliding scale  Patient taking differently: Inject 22 Units into the skin daily before breakfast.    Leroy Sea, MD  Active Self           Med Note Luella Cook   Tue Jul 05, 2021 11:11 AM) Per patient he is taking 16 units  at [facility] but will drop today to 14 units  levothyroxine (SYNTHROID, LEVOTHROID) 88 MCG tablet [id number]  Take 88 mcg by mouth daily before breakfast. [provider]  Active Self  lisinopril (ZESTRIL) 20 MG tablet [id number] Yes Take 20 mg by mouth daily. [provider] Taking Active Self  lisinopril-hydrochlorothiazide (PRINZIDE,ZESTORETIC) 20-25 MG per tablet [id number] No Take 1 tablet by mouth daily.   Patient not taking: Reported on 10/04/2021   [provider] Not Taking Active Self           Med Note Luella Cook   Tue Jul 05, 2021 11:14 AM) Per patient he is taking 1/2  [facility]  Menthol, Topical Analgesic, (BIOFREEZE EX) [id number]  Apply 1 application topically daily as needed (pain shoulder).  [provider]  Active Self  metFORMIN (GLUCOPHAGE) 1000 MG tablet [id number]  Take 1,000 mg by mouth 2 (two) times daily with a meal. [provider]  Active Self           Med Note Luella Cook   Tue Oct 04, 2021 11:07 AM) Metformin and Jardiance 1000/5 2 tablets qd  Multiple Vitamins-Minerals (MULTIVITAMIN PO) [id number]  Take 1 tablet by mouth daily. 50 + [provider]  Active Self  Naphazoline HCl (CLEAR EYES OP) [id number]  Place 1 drop into both eyes daily as needed (irritation). [provider]  Active Self  polyethylene glycol (MIRALAX / GLYCOLAX) 17 g packet [id number]  Take 17 g by mouth 2 (two) times daily.  Patient taking differently: Take 17 g by mouth daily as needed for mild constipation or moderate constipation.    Lanney Gins, PA-C  Active Self  Potassium 99 MG TABS [id number]  Take 198 mg by mouth 2 (two) times daily. [provider]  Active Self  pregabalin (LYRICA) 150 MG capsule [id number]  Take 150 mg by mouth daily. [provider]  Active Self  pregabalin (LYRICA) 225 MG capsule [id number]  Take 225 mg by mouth at bedtime.  [provider]  Active Self           Med Note Helane Rima Apr 10, 2016  9:18 AM)        Semaglutide (OZEMPIC, 0.25 OR 0.5 MG/DOSE, [place]) [id number] Yes Inject 0.5 mg into the skin once a week. Friday [provider] Taking Active   sildenafil (VIAGRA) 100 MG tablet [id number]  Take 100 mg by mouth daily as needed for erectile dysfunction. [provider]  Active Self  simvastatin (ZOCOR) 20 MG tablet [id number]  Take 20 mg by mouth every evening.  [provider]  Active Self             SDOH:  (Social Determinants of Health) assessments and interventions performed:    Care Plan  Review of patient past  medical history, allergies, medications, health status, including review of consultants reports, laboratory and other test data, was performed as part of comprehensive evaluation for care management services.   Care Plan : RN Care Manager Plan Of Care  Updates made by , Dennard Schaumann, RN since 01/16/2022 12:00 AM     Problem: Knowledge Deficit Related to Diabetes and Care Coordination needs   Priority: High     Long-Range Goal: Developement Plan of Care for Management of Diabetes   Start Date: 10/04/2021  Expected End Date: 11/18/2022  Priority: High  Note:   Current Barriers:  Knowledge Deficits related to plan of care  for management of DMII   RNCM Clinical Goal(s):  Patient will verbalize understanding of plan for management of DMII as evidenced by A1C 6.5 or <. take all medications exactly as prescribed and will call provider for medication related questions as evidenced by continuation of monitoring blood sugars and adhering to diabetic diet    through collaboration with RN Care manager, provider, and care team.   Interventions: Inter-disciplinary care team collaboration (see longitudinal plan of care) Evaluation of current treatment plan related to  self management and patient's adherence to plan as established by provider Medication adherence    Patient Goals/Self-Care Activities: Take medications as prescribed   Attend all scheduled provider appointments Call pharmacy for medication refills 3-7 days in advance of running out of medications Perform all self care activities independently  Perform IADL's (shopping, preparing meals, housekeeping, managing finances) independently Call provider office for new concerns or questions  call the Suicide and Crisis Lifeline: 988 if experiencing a Mental Health or Behavioral Health Crisis  check blood sugar at prescribed times: 4 times daily and when you have symptoms of low or high blood sugar check feet daily for cuts, sores or  redness take the blood sugar log to all doctor visits take the blood sugar meter to all doctor visits trim toenails straight across set a realistic goal    01/16/2022 Pt is taking glargine 18 units instead of 16 units. His A1C has increased from 6.8 to 7.9. He has a free style libre and monitors 3-4 times or more a day. No episodes of hypoglycemia since last outreach. Per patient he is still currently having back pain. Patient will be out of town until May. He will see [facility] in June. A1C to be drawn in May.      Plan: Telephone follow up appointment with care management team member scheduled for:  April 29, 2022 The patient has been provided with contact information for the care management team and has been advised to call with any health related questions or concerns.   Gean Maidens BSN RN [facility] Care Management [phone]

## 2022-01-27 DIAGNOSIS — M159 Polyosteoarthritis, unspecified: Secondary | ICD-10-CM | POA: Diagnosis not present

## 2022-01-27 DIAGNOSIS — M5136 Other intervertebral disc degeneration, lumbar region: Secondary | ICD-10-CM | POA: Diagnosis not present

## 2022-01-27 DIAGNOSIS — Z7960 Long term (current) use of unspecified immunomodulators and immunosuppressants: Secondary | ICD-10-CM | POA: Diagnosis not present

## 2022-01-27 DIAGNOSIS — R768 Other specified abnormal immunological findings in serum: Secondary | ICD-10-CM | POA: Diagnosis not present

## 2022-01-27 DIAGNOSIS — M154 Erosive (osteo)arthritis: Secondary | ICD-10-CM | POA: Diagnosis not present

## 2022-04-11 DIAGNOSIS — N401 Enlarged prostate with lower urinary tract symptoms: Secondary | ICD-10-CM | POA: Diagnosis not present

## 2022-04-11 DIAGNOSIS — L03119 Cellulitis of unspecified part of limb: Secondary | ICD-10-CM | POA: Diagnosis not present

## 2022-04-11 DIAGNOSIS — Z6826 Body mass index (BMI) 26.0-26.9, adult: Secondary | ICD-10-CM | POA: Diagnosis not present

## 2022-04-11 DIAGNOSIS — M0600 Rheumatoid arthritis without rheumatoid factor, unspecified site: Secondary | ICD-10-CM | POA: Diagnosis not present

## 2022-04-11 DIAGNOSIS — E039 Hypothyroidism, unspecified: Secondary | ICD-10-CM | POA: Diagnosis not present

## 2022-04-11 DIAGNOSIS — I10 Essential (primary) hypertension: Secondary | ICD-10-CM | POA: Diagnosis not present

## 2022-04-11 DIAGNOSIS — E1165 Type 2 diabetes mellitus with hyperglycemia: Secondary | ICD-10-CM | POA: Diagnosis not present

## 2022-04-11 DIAGNOSIS — Z79899 Other long term (current) drug therapy: Secondary | ICD-10-CM | POA: Diagnosis not present

## 2022-04-11 DIAGNOSIS — M545 Low back pain, unspecified: Secondary | ICD-10-CM | POA: Diagnosis not present

## 2022-04-11 DIAGNOSIS — E7800 Pure hypercholesterolemia, unspecified: Secondary | ICD-10-CM | POA: Diagnosis not present

## 2022-04-11 DIAGNOSIS — M159 Polyosteoarthritis, unspecified: Secondary | ICD-10-CM | POA: Diagnosis not present

## 2022-04-13 DIAGNOSIS — M48061 Spinal stenosis, lumbar region without neurogenic claudication: Secondary | ICD-10-CM | POA: Diagnosis not present

## 2022-04-19 DIAGNOSIS — Z7960 Long term (current) use of unspecified immunomodulators and immunosuppressants: Secondary | ICD-10-CM | POA: Diagnosis not present

## 2022-04-19 DIAGNOSIS — M25542 Pain in joints of left hand: Secondary | ICD-10-CM | POA: Diagnosis not present

## 2022-04-19 DIAGNOSIS — R768 Other specified abnormal immunological findings in serum: Secondary | ICD-10-CM | POA: Diagnosis not present

## 2022-04-19 DIAGNOSIS — M159 Polyosteoarthritis, unspecified: Secondary | ICD-10-CM | POA: Diagnosis not present

## 2022-05-01 ENCOUNTER — Other Ambulatory Visit: Payer: Self-pay | Admitting: *Deleted

## 2022-05-01 NOTE — Patient Outreach (Signed)
[facility] Care Management RN Health Coach Note   05/01/2022 Name:  David FlorenceRussell F Potteiger MRN:  [id number] DOB:  11/27/1940  Summary: A1C has increased from 7.9 to 8.1.  Fasting blood sugar is 140. Per patient he is under a lot of stress with brother and family issues. Patient not adhering to his diet. Frequent episodes of hypoglycemia. Per patient he has had 3 episodes in a week and 22 episodes over 90 days. He eats a candy bar and juice when this occur. Patient 30 day average is 160. His 90 days average is 167. He stated his balance is a little off. He is having a lot of pain in his hands.   Recommendations/Changes made from today's visit: Medication adherence Monitor blood sugar Dietary adherence   Subjective: David Mathews is an 81 y.o. year old male who is a primary patient of Lonie PeakConroy, [place], [place]. The care management team was consulted for assistance with care management and/or care coordination needs.    RN Health Coach completed Telephone Visit today.   Objective:  Medications Reviewed Today     Reviewed by Luella CookPleasant,  H, RN (Case Manager) on 10/04/21 at 1112  Med List Status: <None>   Medication Order Taking? Sig Documenting Provider Last Dose Status Informant  alfuzosin (UROXATRAL) 10 MG 24 hr tablet [id number]  Take 10 mg by mouth daily. [provider]  Active Self  aspirin EC 81 MG tablet [id number]  Take 81 mg by mouth daily. Swallow whole. [provider]  Active Self  cyclobenzaprine (FLEXERIL) 10 MG tablet [id number]  Take 1 tablet (10 mg total) by mouth 2 (two) times daily as needed for muscle spasms. Lanney GinsBabish, Matthew, PA-C  Active Self  diclofenac Sodium (VOLTAREN) 1 % GEL [id number]  Apply 2 g topically daily as needed (Knee pain). [provider]  Active Self  docusate sodium (COLACE) 100 MG capsule [id number]  Take 1 capsule (100 mg total) by mouth 2 (two) times daily.  Patient not taking: Reported on 07/23/2020    Lanney GinsBabish, Matthew, PA-C  Active Self  famotidine (PEPCID) 40 MG tablet [id number]  Take 40 mg by mouth at bedtime.  [provider]  Active Self  ferrous sulfate (FERROUSUL) 325 (65 FE) MG tablet [id number]  Take 1 tablet (325 mg total) by mouth 3 (three) times daily with meals for 14 days. Lanney GinsBabish, Matthew, PA-C  Expired 07/30/20 2359   finasteride (PROSCAR) 5 MG tablet [id number]  Take 5 mg by mouth every evening.  [provider]  Active Self  furosemide (LASIX) 20 MG tablet [id number] Yes Take 20 mg by mouth daily. [provider] Taking Active Self           Med Note Luella Cook(,  H   Tue Oct 04, 2021 10:53 AM) Per patient was D/CD  HYDROcodone-acetaminophen (NORCO) 5-325 MG tablet [id number]  Take 1-2 tablets by mouth every 6 (six) hours as needed for moderate pain or severe pain. Lanney GinsBabish, Matthew, PA-C  Active Self  insulin aspart (NOVOLOG FLEXPEN) 100 UNIT/ML FlexPen [id number]  Inject 10-14 Units into the skin 3 (three) times daily with meals. PER SLIDING SCALE [provider]  Active Self           Med Note Ian Malkin(, Dennard Schaumann H   Tue Jul 05, 2021 11:10 AM) Per patient he is taking 8-10 units  Insulin Glargine (LANTUS SOLOSTAR) 100 UNIT/ML Solostar Pen [id number]  Inject 55 Units into the skin daily. Per sliding scale  Patient taking differently:  Inject 22 Units into the skin daily before breakfast.    Leroy Sea, MD  Active Self           Med Note Ian Malkin, Dennard Schaumann   Tue Jul 05, 2021 11:11 AM) Per patient he is taking 16 units at [facility] but will drop today to 14 units  levothyroxine (SYNTHROID, LEVOTHROID) 88 MCG tablet [id number]  Take 88 mcg by mouth daily before breakfast. [provider]  Active Self  lisinopril (ZESTRIL) 20 MG tablet [id number] Yes Take 20 mg by mouth daily. [provider] Taking Active Self  lisinopril-hydrochlorothiazide (PRINZIDE,ZESTORETIC) 20-25 MG per tablet [id number] No Take 1 tablet by mouth daily.    Patient not taking: Reported on 10/04/2021   [provider] Not Taking Active Self           Med Note Luella Cook   Tue Jul 05, 2021 11:14 AM) Per patient he is taking 1/2  tablet per [facility] recommendations  Menthol, Topical Analgesic, (BIOFREEZE EX) [id number]  Apply 1 application topically daily as needed (pain shoulder).  [provider]  Active Self  metFORMIN (GLUCOPHAGE) 1000 MG tablet [id number]  Take 1,000 mg by mouth 2 (two) times daily with a meal. [provider]  Active Self           Med Note Luella Cook   Tue Oct 04, 2021 11:07 AM) Metformin and Jardiance 1000/5 2 tablets qd  Multiple Vitamins-Minerals (MULTIVITAMIN PO) [id number]  Take 1 tablet by mouth daily. 50 + [provider]  Active Self  Naphazoline HCl (CLEAR EYES OP) [id number]  Place 1 drop into both eyes daily as needed (irritation). [provider]  Active Self  polyethylene glycol (MIRALAX / GLYCOLAX) 17 g packet [id number]  Take 17 g by mouth 2 (two) times daily.  Patient taking differently: Take 17 g by mouth daily as needed for mild constipation or moderate constipation.    Lanney Gins, PA-C  Active Self  Potassium 99 MG TABS [id number]  Take 198 mg by mouth 2 (two) times daily. [provider]  Active Self  pregabalin (LYRICA) 150 MG capsule [id number]  Take 150 mg by mouth daily. [provider]  Active Self  pregabalin (LYRICA) 225 MG capsule [id number]  Take 225 mg by mouth at bedtime.  [provider]  Active Self           Med Note Helane Rima Apr 10, 2016  9:18 AM)        Semaglutide (OZEMPIC, 0.25 OR 0.5 MG/DOSE, [place]) [id number] Yes Inject 0.5 mg into the skin once a week. Friday [provider] Taking Active   sildenafil (VIAGRA) 100 MG tablet [id number]  Take 100 mg by mouth daily as needed for erectile dysfunction. [provider]  Active Self  simvastatin (ZOCOR) 20 MG tablet [id number]  Take 20  mg by mouth every evening.  [provider]  Active Self             SDOH:  (Social Determinants of Health) assessments and interventions performed:    Care Plan  Review of patient past medical history, allergies, medications, health status, including review of consultants reports, laboratory and other test data, was performed as part of comprehensive evaluation for care management services.   Care Plan : RN Care Manager Plan Of Care  Updates made by , Dennard Schaumann, RN since 05/01/2022 12:00 AM     Problem: Knowledge Deficit Related to Diabetes  and Care Coordination needs   Priority: High     Long-Range Goal: Developement Plan of Care for Management of Diabetes   Start Date: 10/04/2021  Expected End Date: 11/18/2022  Priority: High  Note:   Current Barriers:  Knowledge Deficits related to plan of care for management of DMII   RNCM Clinical Goal(s):  Patient will verbalize understanding of plan for management of DMII as evidenced by A1C 6.5 or <. take all medications exactly as prescribed and will call provider for medication related questions as evidenced by continuation of monitoring blood sugars and adhering to diabetic diet    through collaboration with RN Care manager, provider, and care team.   Interventions: Inter-disciplinary care team collaboration (see longitudinal plan of care) Evaluation of current treatment plan related to  self management and patient's adherence to plan as established by provider Medication adherence    Patient Goals/Self-Care Activities: Take medications as prescribed   Attend all scheduled provider appointments Call pharmacy for medication refills 3-7 days in advance of running out of medications Perform all self care activities independently  Perform IADL's (shopping, preparing meals, housekeeping, managing finances) independently Call provider office for new concerns or questions  call the Suicide and Crisis Lifeline:  988 if experiencing a Mental Health or Behavioral Health Crisis  check blood sugar at prescribed times: 4 times daily and when you have symptoms of low or high blood sugar check feet daily for cuts, sores or redness take the blood sugar log to all doctor visits take the blood sugar meter to all doctor visits trim toenails straight across set a realistic goal    01/16/2022 Pt is taking glargine 18 units instead of 16 units. His A1C has increased from 6.8 to 7.9. He has a free style libre and monitors 3-4 times or more a day. No episodes of hypoglycemia since last outreach. Per patient he is still currently having back pain. Patient will be out of town until May. He will see [facility] in June. A1C to be drawn in May.  [id number] Patient A1C has increased from 7.9 to 8.1.  His fasting blood sugar is 140. Per patient he is under a lot of stress with brother and family issues. Patient stated he had not been adhering to his diet. Patient is having frequent episodes of hypoglycemia. Per patient he has had 3 episodes in a week and 22 episodes over 90 days. He eats a candy bar and juice when this occur. Patient 30 day average is 160. His 90 days average is 167. He stated his balance is a little off. He is having a lot of pain in his hands.       Plan: Telephone follow up appointment with care management team member scheduled for:  August 07, 2022 The patient has been provided with contact information for the care management team and has been advised to call with any health related questions or concerns.   Gean Maidens BSN RN [facility] Care Management [phone]

## 2022-05-01 NOTE — Patient Instructions (Signed)
Visit Information  Thank you for taking time to visit with me today. Please don't hesitate to contact me if I can be of assistance to you before our next scheduled telephone appointment.  Following are the goals we discussed today:    Our next appointment is by telephone on August 07, 2022  Please call Gean Maidens RN [phone] if you need to cancel or reschedule your appointment.   Please call the Suicide and Crisis Lifeline: 988 if you are experiencing a Mental Health or Behavioral Health Crisis or need someone to talk to.  Following is a copy of your care plan:  Care Plan : RN Care Manager Plan Of Care  Updates made by , Dennard Schaumann, RN since 05/01/2022 12:00 AM     Problem: Knowledge Deficit Related to Diabetes and Care Coordination needs   Priority: High     Long-Range Goal: Developement Plan of Care for Management of Diabetes   Start Date: 10/04/2021  Expected End Date: 11/18/2022  Priority: High  Note:   Current Barriers:  Knowledge Deficits related to plan of care for management of DMII   RNCM Clinical Goal(s):  Patient will verbalize understanding of plan for management of DMII as evidenced by A1C 6.5 or <. take all medications exactly as prescribed and will call provider for medication related questions as evidenced by continuation of monitoring blood sugars and adhering to diabetic diet    through collaboration with RN Care manager, provider, and care team.   Interventions: Inter-disciplinary care team collaboration (see longitudinal plan of care) Evaluation of current treatment plan related to  self management and patient's adherence to plan as established by provider Medication adherence    Patient Goals/Self-Care Activities: Take medications as prescribed   Attend all scheduled provider appointments Call pharmacy for medication refills 3-7 days in advance of running out of medications Perform all self care activities independently  Perform  IADL's (shopping, preparing meals, housekeeping, managing finances) independently Call provider office for new concerns or questions  call the Suicide and Crisis Lifeline: 988 if experiencing a Mental Health or Behavioral Health Crisis  check blood sugar at prescribed times: 4 times daily and when you have symptoms of low or high blood sugar check feet daily for cuts, sores or redness take the blood sugar log to all doctor visits take the blood sugar meter to all doctor visits trim toenails straight across set a realistic goal    01/16/2022 Pt is taking glargine 18 units instead of 16 units. His A1C has increased from 6.8 to 7.9. He has a free style libre and monitors 3-4 times or more a day. No episodes of hypoglycemia since last outreach. Per patient he is still currently having back pain. Patient will be out of town until May. He will see [facility] in June. A1C to be drawn in May.  [id number] Patient A1C has increased from 7.9 to 8.1.  His fasting blood sugar is 140. Per patient he is under a lot of stress with brother and family issues. Patient stated he had not been adhering to his diet. Patient is having frequent episodes of hypoglycemia. Per patient he has had 3 episodes in a week and 22 episodes over 90 days. He eats a candy bar and juice when this occur. Patient 30 day average is 160. His 90 days average is 167. He stated his balance is a little off. He is having a lot of pain in his hands.      The patient verbalized  understanding of instructions, educational materials, and care plan provided today and agreed to receive a mailed copy of patient instructions, educational materials, and care plan.   Telephone follow up appointment with care management team member scheduled for: The patient has been provided with contact information for the care management team and has been advised to call with any health related questions or concerns.   SIGNATURE  Gean Maidens BSN RN [facility]  Care Management [phone]

## 2022-06-09 ENCOUNTER — Ambulatory Visit: Payer: Self-pay | Admitting: *Deleted

## 2022-06-09 NOTE — Patient Outreach (Signed)
[facility]) Care Management  06/09/2022  RYETT HAMMAN 01/28/1941 [id number]   RN Health Coach telephone call to patient.  [employer] compliance verified. Patient A1C is 8.1. RN case closure referred to Care Coordinator for continue care   Plan:  Refer to Care Coordinator for continue care  Gean Maidens BSN RN [facility] Care Management [phone]

## 2022-06-26 DIAGNOSIS — Z1331 Encounter for screening for depression: Secondary | ICD-10-CM | POA: Diagnosis not present

## 2022-06-26 DIAGNOSIS — Z9181 History of falling: Secondary | ICD-10-CM | POA: Diagnosis not present

## 2022-06-26 DIAGNOSIS — E785 Hyperlipidemia, unspecified: Secondary | ICD-10-CM | POA: Diagnosis not present

## 2022-06-26 DIAGNOSIS — Z0000 Encounter for general adult medical examination without abnormal findings: Secondary | ICD-10-CM | POA: Diagnosis not present

## 2022-07-11 DIAGNOSIS — B351 Tinea unguium: Secondary | ICD-10-CM | POA: Diagnosis not present

## 2022-07-11 DIAGNOSIS — I739 Peripheral vascular disease, unspecified: Secondary | ICD-10-CM | POA: Diagnosis not present

## 2022-07-11 DIAGNOSIS — M792 Neuralgia and neuritis, unspecified: Secondary | ICD-10-CM | POA: Diagnosis not present

## 2022-07-11 DIAGNOSIS — R600 Localized edema: Secondary | ICD-10-CM | POA: Diagnosis not present

## 2022-07-11 DIAGNOSIS — E1142 Type 2 diabetes mellitus with diabetic polyneuropathy: Secondary | ICD-10-CM | POA: Diagnosis not present

## 2022-07-14 DIAGNOSIS — I10 Essential (primary) hypertension: Secondary | ICD-10-CM | POA: Diagnosis not present

## 2022-07-14 DIAGNOSIS — E039 Hypothyroidism, unspecified: Secondary | ICD-10-CM | POA: Diagnosis not present

## 2022-07-14 DIAGNOSIS — M0600 Rheumatoid arthritis without rheumatoid factor, unspecified site: Secondary | ICD-10-CM | POA: Diagnosis not present

## 2022-07-14 DIAGNOSIS — E7800 Pure hypercholesterolemia, unspecified: Secondary | ICD-10-CM | POA: Diagnosis not present

## 2022-07-14 DIAGNOSIS — E1165 Type 2 diabetes mellitus with hyperglycemia: Secondary | ICD-10-CM | POA: Diagnosis not present

## 2022-07-14 DIAGNOSIS — N401 Enlarged prostate with lower urinary tract symptoms: Secondary | ICD-10-CM | POA: Diagnosis not present

## 2022-07-14 DIAGNOSIS — M545 Low back pain, unspecified: Secondary | ICD-10-CM | POA: Diagnosis not present

## 2022-07-14 DIAGNOSIS — M159 Polyosteoarthritis, unspecified: Secondary | ICD-10-CM | POA: Diagnosis not present

## 2022-07-14 DIAGNOSIS — R011 Cardiac murmur, unspecified: Secondary | ICD-10-CM | POA: Diagnosis not present

## 2022-07-19 DIAGNOSIS — E1165 Type 2 diabetes mellitus with hyperglycemia: Secondary | ICD-10-CM | POA: Diagnosis not present

## 2022-07-19 DIAGNOSIS — Z6824 Body mass index (BMI) 24.0-24.9, adult: Secondary | ICD-10-CM | POA: Diagnosis not present

## 2022-07-19 DIAGNOSIS — M2041 Other hammer toe(s) (acquired), right foot: Secondary | ICD-10-CM | POA: Diagnosis not present

## 2022-07-26 ENCOUNTER — Ambulatory Visit: Payer: Self-pay

## 2022-07-26 NOTE — Patient Instructions (Signed)
Visit Information  Thank you for taking time to visit with me today. Please don't hesitate to contact me if I can be of assistance to you before our next scheduled telephone appointment.  Following are the goals we discussed today:   Goals Addressed               This Visit's Progress     I worry about my balance and posture (pt-stated)        Care Coordination Interventions: Reviewed provider established plan for pain management Discussed importance of adherence to all scheduled medical appointments Reviewed with patient prescribed pharmacological and nonpharmacological pain relief strategies Screening for signs and symptoms of depression related to chronic disease state  Assessed social determinant of health barriers Will mail [facility] exercise packet. Encouraged patient to attempt exercises.  Reviewed patients concern for balance and his posture. Reviewed with patient no recommendation for further interventions for posture due to multiple surgeries already. Reviewed falls and fall prevention. Assessed pain       I would like to update my advanced directives (pt-stated)        Care Coordination Interventions: Provided education to patient and/or caregiver about advanced directives Mailed advanced directive packet          Our next appointment is by telephone on 08/24/2022 at 1530  Please call the care guide team at [phone] if you need to cancel or reschedule your appointment.   If you are experiencing a Mental Health or Behavioral Health Crisis or need someone to talk to, please call the Suicide and Crisis Lifeline: 988 call the [place] National Suicide Prevention Lifeline: (626)584-3262 or TTY: 815-672-8912 TTY 214-518-2967) to talk to a trained counselor call 1-800-273-TALK (toll free, 24 hour hotline) call 911   The patient verbalized understanding of instructions, educational materials, and care plan provided today and agreed to receive a mailed copy of patient  instructions, educational materials, and care plan.   Rowe Pavy, RN, BSN, CEN [facility] [phone]

## 2022-07-26 NOTE — Patient Outreach (Signed)
  Care Coordination   Initial Visit Note   07/26/2022 Name: David Mathews MRN: [id number] DOB: Oct 19, 1941  David Mathews is a 81 y.o. year old male who sees David Mathews, [place] for primary care. I spoke with  David Mathews by phone today.  What matters to the patients health and wellness today?  Reviewed [facility] care coordination program with patient again.  Patient reports to me that he is not worried about his DM.  Reports recent A1c of 8.0  Reports that he is knowledgable how to self manage his DM.  [facility]  Reports he knows what foods elevate his CBG.  Patients main concern today is his back pain and his posture.   Goals Addressed               This Visit's Progress     I worry about my balance and posture (pt-stated)        Care Coordination Interventions: Reviewed provider established plan for pain management Discussed importance of adherence to all scheduled medical appointments Reviewed with patient prescribed pharmacological and nonpharmacological pain relief strategies Screening for signs and symptoms of depression related to chronic disease state  Assessed social determinant of health barriers Will mail [facility] exercise packet. Encouraged patient to attempt exercises.  Reviewed patients concern for balance and his posture. Reviewed with patient no recommendation for further interventions for posture due to multiple surgeries already. Reviewed falls and fall prevention. Assessed pain       I would like to update my advanced directives (pt-stated)        Care Coordination Interventions: Provided education to patient and/or caregiver about advanced directives Mailed advanced directive packet         SDOH assessments and interventions completed:  Yes  SDOH Interventions Today    Flowsheet Row Most Recent Value  SDOH Interventions   Food Insecurity Interventions Intervention Not Indicated  Housing Interventions  Intervention Not Indicated  Transportation Interventions Intervention Not Indicated  Utilities Interventions Intervention Not Indicated  Alcohol Usage Interventions Intervention Not Indicated (Score <7)        Care Coordination Interventions Activated:  Yes  Care Coordination Interventions:  Yes, provided   Follow up plan: Follow up call scheduled for 08/24/2022    Encounter Outcome:  Pt. Visit Completed   David Pavy, RN, BSN, CEN [facility] Coordinator [phone]

## 2022-08-07 ENCOUNTER — Ambulatory Visit: Payer: Medicare Other | Admitting: *Deleted

## 2022-08-10 DIAGNOSIS — G5622 Lesion of ulnar nerve, left upper limb: Secondary | ICD-10-CM | POA: Diagnosis not present

## 2022-08-23 DIAGNOSIS — M25542 Pain in joints of left hand: Secondary | ICD-10-CM | POA: Diagnosis not present

## 2022-08-23 DIAGNOSIS — R768 Other specified abnormal immunological findings in serum: Secondary | ICD-10-CM | POA: Diagnosis not present

## 2022-08-23 DIAGNOSIS — M158 Other polyosteoarthritis: Secondary | ICD-10-CM | POA: Diagnosis not present

## 2022-08-23 DIAGNOSIS — Z7969 Long term (current) use of other immunomodulators and immunosuppressants: Secondary | ICD-10-CM | POA: Diagnosis not present

## 2022-08-24 ENCOUNTER — Telehealth: Payer: Self-pay

## 2022-08-24 NOTE — Patient Outreach (Signed)
  Care Coordination   08/24/2022 Name: David Mathews MRN: [id number] DOB: 06-09-1941   Care Coordination Outreach Attempts:  An unsuccessful telephone outreach was attempted for a scheduled appointment today.  Follow Up Plan:  Additional outreach attempts will be made to offer the patient care coordination information and services.   Encounter Outcome:  No Answer  Care Coordination Interventions Activated:  No   Care Coordination Interventions:  No, not indicated    Tomasa Rand, RN, BSN, CEN [facility] [employer] [phone]

## 2022-08-28 ENCOUNTER — Telehealth: Payer: Self-pay | Admitting: *Deleted

## 2022-08-28 NOTE — Chronic Care Management (AMB) (Unsigned)
  Care Coordination  Outreach Note  08/28/2022 Name: CYREE CHUONG MRN: [id number] DOB: 1941/05/06   Care Coordination Outreach Attempts: An unsuccessful telephone outreach was attempted today to offer the patient information about available care coordination services as a benefit of their health plan.   Rescheduling   Follow Up Plan:  Additional outreach attempts will be made to offer the patient care coordination information and services.   Encounter Outcome:  No Answer  Julian Hy, [place] Direct Dial: [phone]

## 2022-08-29 NOTE — Chronic Care Management (AMB) (Signed)
  Care Coordination   Note   08/29/2022 Name: RUFFUS KAMAKA MRN: [id number] DOB: 10-12-1941  AURELIANO OSHIELDS is a 81 y.o. year old male who sees Cyndi Bender, [place] for primary care. I reached out to Darlina Guys by phone today to offer care coordination services.  Rescheduling   Mr. Warf was given information about Care Coordination services today including:   The Care Coordination services include support from the care team which includes your Nurse Coordinator, Clinical Social Worker, or Pharmacist.  The Care Coordination team is here to help remove barriers to the health concerns and goals most important to you. Care Coordination services are voluntary, and the patient may decline or stop services at any time by request to their care team member.   Care Coordination Consent Status: Patient agreed to services and verbal consent obtained.   Follow up plan:  Telephone appointment with care coordination team member scheduled for:  09/18/2022  Encounter Outcome:  Pt. Scheduled  Julian Hy, [place] Direct Dial: [phone]

## 2022-08-30 DIAGNOSIS — G6289 Other specified polyneuropathies: Secondary | ICD-10-CM | POA: Diagnosis not present

## 2022-09-07 DIAGNOSIS — G5622 Lesion of ulnar nerve, left upper limb: Secondary | ICD-10-CM

## 2022-09-07 HISTORY — DX: Lesion of ulnar nerve, left upper limb: G56.22

## 2022-09-14 DIAGNOSIS — M48061 Spinal stenosis, lumbar region without neurogenic claudication: Secondary | ICD-10-CM | POA: Diagnosis not present

## 2022-09-18 ENCOUNTER — Ambulatory Visit: Payer: Self-pay

## 2022-09-18 NOTE — Patient Outreach (Signed)
  Care Coordination   Follow Up Visit Note   09/18/2022 Name: David Mathews MRN: [id number] DOB: 22-Sep-1941  David Mathews is a 81 y.o. year old male who sees Cyndi Bender, [place] for primary care. I spoke with  Darlina Guys by phone today.  What matters to the patients health and wellness today?  Patient reports that he is doing well. Reports that he is working with the [place] to assist with insulin adjustments.  Reports recent average for CBG of 77.  States that he saw Dr. Trenton Gammon about his back and nothing more can be done.  Patient reports that he is doing his exercises.  1 fall since last telephone encounter. Patient reports that he drug his toe across the carpet and fell. No injury.    Goals Addressed               This Visit's Progress     I worry about my balance and posture (pt-stated)        Care Coordination Interventions: Reviewed falls Reviewed plan Reviewed pain level Reviewed office visit with Dr. Trenton Gammon. Patient would like follow up in 5-6 months. Ensured that patient has my contact information to call me sooner if needed.        I would like to update my advanced directives (pt-stated)        Care Coordination Interventions: Confirmed patient received packet         SDOH assessments and interventions completed:  No     Care Coordination Interventions Activated:  Yes  Care Coordination Interventions:  Yes, provided   Follow up plan: Follow up call scheduled for 01/22/2023    Encounter Outcome:  Pt. Visit Completed   Tomasa Rand, RN, BSN, CEN [place] Coordinator [phone]

## 2022-10-24 DIAGNOSIS — M159 Polyosteoarthritis, unspecified: Secondary | ICD-10-CM | POA: Diagnosis not present

## 2022-10-24 DIAGNOSIS — I10 Essential (primary) hypertension: Secondary | ICD-10-CM | POA: Diagnosis not present

## 2022-10-24 DIAGNOSIS — Z79899 Other long term (current) drug therapy: Secondary | ICD-10-CM | POA: Diagnosis not present

## 2022-10-24 DIAGNOSIS — E039 Hypothyroidism, unspecified: Secondary | ICD-10-CM | POA: Diagnosis not present

## 2022-10-24 DIAGNOSIS — Z139 Encounter for screening, unspecified: Secondary | ICD-10-CM | POA: Diagnosis not present

## 2022-10-24 DIAGNOSIS — N401 Enlarged prostate with lower urinary tract symptoms: Secondary | ICD-10-CM | POA: Diagnosis not present

## 2022-10-24 DIAGNOSIS — E7800 Pure hypercholesterolemia, unspecified: Secondary | ICD-10-CM | POA: Diagnosis not present

## 2022-10-24 DIAGNOSIS — M0600 Rheumatoid arthritis without rheumatoid factor, unspecified site: Secondary | ICD-10-CM | POA: Diagnosis not present

## 2022-10-24 DIAGNOSIS — M25562 Pain in left knee: Secondary | ICD-10-CM | POA: Diagnosis not present

## 2022-10-24 DIAGNOSIS — Z23 Encounter for immunization: Secondary | ICD-10-CM | POA: Diagnosis not present

## 2022-10-24 DIAGNOSIS — M545 Low back pain, unspecified: Secondary | ICD-10-CM | POA: Diagnosis not present

## 2022-10-24 DIAGNOSIS — E1140 Type 2 diabetes mellitus with diabetic neuropathy, unspecified: Secondary | ICD-10-CM | POA: Diagnosis not present

## 2022-11-07 DIAGNOSIS — Z125 Encounter for screening for malignant neoplasm of prostate: Secondary | ICD-10-CM | POA: Diagnosis not present

## 2022-11-07 DIAGNOSIS — E871 Hypo-osmolality and hyponatremia: Secondary | ICD-10-CM | POA: Diagnosis not present

## 2023-01-02 DIAGNOSIS — E119 Type 2 diabetes mellitus without complications: Secondary | ICD-10-CM | POA: Diagnosis not present

## 2023-01-02 DIAGNOSIS — H353131 Nonexudative age-related macular degeneration, bilateral, early dry stage: Secondary | ICD-10-CM | POA: Diagnosis not present

## 2023-01-17 DIAGNOSIS — M48061 Spinal stenosis, lumbar region without neurogenic claudication: Secondary | ICD-10-CM | POA: Diagnosis not present

## 2023-01-22 ENCOUNTER — Ambulatory Visit: Payer: Self-pay

## 2023-01-22 NOTE — Patient Outreach (Signed)
  Care Coordination   Follow Up Visit Note   01/22/2023 Name: David Mathews MRN: [id number] DOB: 30-Jan-1941  David Mathews is a 82 y.o. year old male who sees Cyndi Bender, [place] for primary care. I spoke with  Darlina Guys by phone today.  What matters to the patients health and wellness today?  Patient reports that he is doing well. Reports the last A1c 1 week ago was 8.1.  Has a new Libre 3.  New average 142.  7 day average,  average 150 for 14, 30 days.    Goals Addressed               This Visit's Progress     I worry about my balance and posture (pt-stated)        Interventions Today    Flowsheet Row Most Recent Value  Chronic Disease   Chronic disease during today's visit Diabetes, Other  [back and knee pain]  General Interventions   General Interventions Discussed/Reviewed General Interventions Reviewed, Doctor Visits, Durable Medical Equipment (DME)  Doctor Visits Discussed/Reviewed Doctor Visits Reviewed  Durable Medical Equipment (DME) Glucomoter  Exercise Interventions   Exercise Discussed/Reviewed Exercise Discussed, Physical Activity  [Reviewed importance of leg exercises to help rebuild muscle mass]  Physical Activity Discussed/Reviewed Physical Activity Discussed, Types of exercise  Nutrition Interventions   Nutrition Discussed/Reviewed Nutrition Reviewed  Pharmacy Interventions   Pharmacy Dicussed/Reviewed Medications and their functions  Safety Interventions   Safety Discussed/Reviewed Fall Risk      Reviewed with patient the importance of continued [facility], [facility] and Dr. Trenton Gammon.  Reviewed concern for Ozempic and recent muscle mass loss.         SDOH assessments and interventions completed:  No     Care Coordination Interventions:  Yes, provided   Follow up plan: Follow up call scheduled for 04/23/2023    Encounter Outcome:  Pt. Visit Completed   Tomasa Rand, RN, BSN, CEN [place]  Coordinator [phone]

## 2023-02-08 DIAGNOSIS — M159 Polyosteoarthritis, unspecified: Secondary | ICD-10-CM | POA: Diagnosis not present

## 2023-02-08 DIAGNOSIS — E1140 Type 2 diabetes mellitus with diabetic neuropathy, unspecified: Secondary | ICD-10-CM | POA: Diagnosis not present

## 2023-02-08 DIAGNOSIS — I10 Essential (primary) hypertension: Secondary | ICD-10-CM | POA: Diagnosis not present

## 2023-02-08 DIAGNOSIS — N401 Enlarged prostate with lower urinary tract symptoms: Secondary | ICD-10-CM | POA: Diagnosis not present

## 2023-02-08 DIAGNOSIS — E871 Hypo-osmolality and hyponatremia: Secondary | ICD-10-CM | POA: Diagnosis not present

## 2023-02-08 DIAGNOSIS — M0600 Rheumatoid arthritis without rheumatoid factor, unspecified site: Secondary | ICD-10-CM | POA: Diagnosis not present

## 2023-02-08 DIAGNOSIS — M545 Low back pain, unspecified: Secondary | ICD-10-CM | POA: Diagnosis not present

## 2023-02-08 DIAGNOSIS — E7800 Pure hypercholesterolemia, unspecified: Secondary | ICD-10-CM | POA: Diagnosis not present

## 2023-02-08 DIAGNOSIS — E039 Hypothyroidism, unspecified: Secondary | ICD-10-CM | POA: Diagnosis not present

## 2023-02-20 DIAGNOSIS — Z7960 Long term (current) use of unspecified immunomodulators and immunosuppressants: Secondary | ICD-10-CM | POA: Diagnosis not present

## 2023-02-20 DIAGNOSIS — M159 Polyosteoarthritis, unspecified: Secondary | ICD-10-CM | POA: Diagnosis not present

## 2023-02-20 DIAGNOSIS — R768 Other specified abnormal immunological findings in serum: Secondary | ICD-10-CM | POA: Diagnosis not present

## 2023-04-23 ENCOUNTER — Ambulatory Visit: Payer: Self-pay

## 2023-04-23 NOTE — Patient Outreach (Signed)
  Care Coordination   Follow Up Visit Note   04/23/2023 Name: David Mathews MRN: [id number] DOB: Dec 24, 1940  David Mathews is a 82 y.o. year old male who sees Lonie Peak, [place] for primary care. I spoke with  [name] by phone today.  What matters to the patients health and wellness today?  Patient reports that his back pain is worse. 8/10.  Pain is worse in the afternoon.  Reports he is able to do more things in the mornings.  Reports he tripped this week of an extension cord.  Did not get hurt. Able to getup by himself.  No other falls in 3 months.  Reports DM ok.  Is back on Ozempic half dose.  [facility]. [facility] [place])  CBG today 138,  Last A1c 6.9.     7 day average 153,  14 day average, 30 average 162,  60 day average 167,  90 day average of 164.  Patient reports that he has been stressed due to his brother having dementia. Lives in [place].  Reports that he is not taking hydrocodone unless he is in severe pain.    Goals Addressed               This Visit's Progress     I worry about my balance and posture (pt-stated)         Interventions Today    Flowsheet Row Most Recent Value  Chronic Disease   Chronic disease during today's visit Diabetes, Other  [Chronic back pain]  General Interventions   General Interventions Discussed/Reviewed General Interventions Reviewed  Exercise Interventions   Exercise Discussed/Reviewed Physical Activity  Physical Activity Discussed/Reviewed Home Exercise Program (HEP)  Education Interventions   Education Provided Provided Education  [Reviewed with patirnt that he is doing home exercises daily. Stays active. Reviewed pain control and pain plan at home. Encouraged patient to be careful to avoid falls. Assessed pain.]  Provided Verbal Education On Nutrition, Labs, Blood Sugar Monitoring  Labs Reviewed Hgb A1c  [Current A1C OF 6.9]  Nutrition Interventions   Nutrition Discussed/Reviewed Nutrition Discussed,  Carbohydrate meal planning  Pharmacy Interventions   Pharmacy Dicussed/Reviewed Medications and their functions  Safety Interventions   Safety Discussed/Reviewed Fall Risk  Advanced Directive Interventions   Advanced Directives Discussed/Reviewed Advanced Directives Reviewed  [place] documents that were mailed.]       Today's Vitals   04/23/23 0905  PainSc: 8   Reviewed progress that patient has made with DM. Encouraged patient to be aware of fall prevention. Follow up planned in 3 months. Patient to call sooner if needed.       COMPLETED: I would like to update my advanced directives (pt-stated)        Care Coordination Interventions: Reports that he has the advanced directive packet and will update when he is ready.         SDOH assessments and interventions completed:  No     Care Coordination Interventions:  Yes, provided   Follow up plan: Follow up call scheduled for 07/30/2023    Encounter Outcome:  Pt. Visit Completed   Rowe Pavy, RN, BSN, CEN [facility] Coordinator [phone]

## 2023-05-16 DIAGNOSIS — M4714 Other spondylosis with myelopathy, thoracic region: Secondary | ICD-10-CM | POA: Diagnosis not present

## 2023-05-22 DIAGNOSIS — E871 Hypo-osmolality and hyponatremia: Secondary | ICD-10-CM | POA: Diagnosis not present

## 2023-05-22 DIAGNOSIS — E7800 Pure hypercholesterolemia, unspecified: Secondary | ICD-10-CM | POA: Diagnosis not present

## 2023-05-22 DIAGNOSIS — E039 Hypothyroidism, unspecified: Secondary | ICD-10-CM | POA: Diagnosis not present

## 2023-05-22 DIAGNOSIS — R600 Localized edema: Secondary | ICD-10-CM | POA: Diagnosis not present

## 2023-05-22 DIAGNOSIS — M545 Low back pain, unspecified: Secondary | ICD-10-CM | POA: Diagnosis not present

## 2023-05-22 DIAGNOSIS — M0600 Rheumatoid arthritis without rheumatoid factor, unspecified site: Secondary | ICD-10-CM | POA: Diagnosis not present

## 2023-05-22 DIAGNOSIS — I10 Essential (primary) hypertension: Secondary | ICD-10-CM | POA: Diagnosis not present

## 2023-05-22 DIAGNOSIS — N401 Enlarged prostate with lower urinary tract symptoms: Secondary | ICD-10-CM | POA: Diagnosis not present

## 2023-05-22 DIAGNOSIS — K219 Gastro-esophageal reflux disease without esophagitis: Secondary | ICD-10-CM | POA: Diagnosis not present

## 2023-05-22 DIAGNOSIS — E1140 Type 2 diabetes mellitus with diabetic neuropathy, unspecified: Secondary | ICD-10-CM | POA: Diagnosis not present

## 2023-05-22 DIAGNOSIS — M159 Polyosteoarthritis, unspecified: Secondary | ICD-10-CM | POA: Diagnosis not present

## 2023-05-29 DIAGNOSIS — R2689 Other abnormalities of gait and mobility: Secondary | ICD-10-CM

## 2023-05-29 HISTORY — DX: Other abnormalities of gait and mobility: R26.89

## 2023-05-30 DIAGNOSIS — M5412 Radiculopathy, cervical region: Secondary | ICD-10-CM | POA: Diagnosis not present

## 2023-05-30 DIAGNOSIS — M4714 Other spondylosis with myelopathy, thoracic region: Secondary | ICD-10-CM | POA: Diagnosis not present

## 2023-05-30 DIAGNOSIS — M47814 Spondylosis without myelopathy or radiculopathy, thoracic region: Secondary | ICD-10-CM | POA: Diagnosis not present

## 2023-05-30 DIAGNOSIS — M4802 Spinal stenosis, cervical region: Secondary | ICD-10-CM | POA: Diagnosis not present

## 2023-05-30 DIAGNOSIS — M4804 Spinal stenosis, thoracic region: Secondary | ICD-10-CM | POA: Diagnosis not present

## 2023-05-30 DIAGNOSIS — M50322 Other cervical disc degeneration at C5-C6 level: Secondary | ICD-10-CM | POA: Diagnosis not present

## 2023-06-11 DIAGNOSIS — M159 Polyosteoarthritis, unspecified: Secondary | ICD-10-CM | POA: Diagnosis not present

## 2023-06-11 DIAGNOSIS — M069 Rheumatoid arthritis, unspecified: Secondary | ICD-10-CM

## 2023-06-11 DIAGNOSIS — M1990 Unspecified osteoarthritis, unspecified site: Secondary | ICD-10-CM

## 2023-06-11 DIAGNOSIS — R768 Other specified abnormal immunological findings in serum: Secondary | ICD-10-CM | POA: Diagnosis not present

## 2023-06-11 DIAGNOSIS — Z7960 Long term (current) use of unspecified immunomodulators and immunosuppressants: Secondary | ICD-10-CM | POA: Diagnosis not present

## 2023-06-11 HISTORY — DX: Rheumatoid arthritis, unspecified: M06.9

## 2023-06-11 HISTORY — DX: Unspecified osteoarthritis, unspecified site: M19.90

## 2023-06-18 DIAGNOSIS — R293 Abnormal posture: Secondary | ICD-10-CM | POA: Diagnosis not present

## 2023-06-18 DIAGNOSIS — R2681 Unsteadiness on feet: Secondary | ICD-10-CM | POA: Diagnosis not present

## 2023-06-18 DIAGNOSIS — R29898 Other symptoms and signs involving the musculoskeletal system: Secondary | ICD-10-CM | POA: Diagnosis not present

## 2023-06-18 DIAGNOSIS — M2560 Stiffness of unspecified joint, not elsewhere classified: Secondary | ICD-10-CM | POA: Diagnosis not present

## 2023-06-18 DIAGNOSIS — Z9181 History of falling: Secondary | ICD-10-CM | POA: Diagnosis not present

## 2023-06-18 DIAGNOSIS — M4802 Spinal stenosis, cervical region: Secondary | ICD-10-CM | POA: Diagnosis not present

## 2023-06-18 DIAGNOSIS — R278 Other lack of coordination: Secondary | ICD-10-CM | POA: Diagnosis not present

## 2023-06-18 DIAGNOSIS — R2689 Other abnormalities of gait and mobility: Secondary | ICD-10-CM | POA: Diagnosis not present

## 2023-06-21 ENCOUNTER — Other Ambulatory Visit: Payer: Self-pay | Admitting: Neurosurgery

## 2023-06-25 ENCOUNTER — Other Ambulatory Visit: Payer: Self-pay

## 2023-06-25 ENCOUNTER — Encounter (HOSPITAL_COMMUNITY): Payer: Self-pay | Admitting: Neurosurgery

## 2023-06-25 NOTE — Anesthesia Preprocedure Evaluation (Addendum)
Anesthesia Evaluation  Patient identified by MRN, date of birth, ID band Patient awake    Reviewed: Allergy & Precautions, NPO status , Patient's Chart, lab work & pertinent test results  Airway Mallampati: II  TM Distance: >3 FB Neck ROM: Limited    Dental  (+) Edentulous Lower, Edentulous Upper   Pulmonary former smoker   Pulmonary exam normal        Cardiovascular hypertension, Pt. on medications Normal cardiovascular exam+ Valvular Problems/Murmurs AS      Neuro/Psych  Neuromuscular disease  negative psych ROS   GI/Hepatic Neg liver ROS,GERD  Medicated and Controlled,,  Endo/Other  diabetes, Oral Hypoglycemic Agents, Insulin DependentHypothyroidism  Patient on GLP-1 Agonist  Renal/GU negative Renal ROS     Musculoskeletal  (+) Arthritis ,  RA (rheumatoid arthritis)    Abdominal   Peds  Hematology negative hematology ROS (+)   Anesthesia Other Findings Cervical spinal stenosis  Reproductive/Obstetrics                              Anesthesia Physical Anesthesia Plan  ASA: 3  Anesthesia Plan: General   Post-op Pain Management:    Induction: Intravenous  PONV Risk Score and Plan: 2 and Ondansetron, Dexamethasone and Treatment may vary due to age or medical condition  Airway Management Planned: Oral ETT and Video Laryngoscope Planned  Additional Equipment:   Intra-op Plan:   Post-operative Plan: Extubation in OR  Informed Consent: I have reviewed the patients History and Physical, chart, labs and discussed the procedure including the risks, benefits and alternatives for the proposed anesthesia with the patient or authorized representative who has indicated his/her understanding and acceptance.     Dental advisory given  Plan Discussed with: CRNA  Anesthesia Plan Comments: (PAT note by Antionette Poles, PA-C: Follows with cardiology at the [facility] for history of HTN and moderate AS  with mild AI.  Last seen 11/03/2022 and noted to be doing well without any symptoms of chest pain, palpitations, syncope, shortness of breath.  He remains active.  Per cardiology note 11/03/2022, most recent echo in August 2023 showed stable mild to moderate aortic stenosis; peak gradient 24 mmHg and mean gradient 13 mmHg (previous 23 and 14).  Recommended followup in 1 year with repeat echo.  History of poorly controlled IDDM 2.  Last A1c I am able to find in the [place] notes in care everywhere is 8.1 on 01/03/23.     Patient is on once weekly GLP-1 agonist Ozempic, currently on hold for procedure.  Follows with rheumatology for history of rheumatoid arthritis.  Maintained on Plaquenil.  He will need day of surgery labs and evaluation.   TTE 07/18/2022 (Care Everywhere): Interpretation Summary Ejection Fraction = >55%. There is mild left ventricular hypertrophy. Diastolic function indeterminate. The right ventricle is normal in size and function. The left atrial size is normal and the right atrial size is normal. Mild valvular aortic stenosis; peak 24 mmHg and mean 13 mmHg. There is mild mitral regurgitation. There was insufficient tricuspid regurgitation detected to calculate right  ventricular systolic pressure.   )         Anesthesia Quick Evaluation

## 2023-06-25 NOTE — Progress Notes (Addendum)
Anesthesia Chart Review: Same day workup  Follows with cardiology at the [facility] for history of HTN and moderate AS with mild AI.  Last seen 11/03/2022 and noted to be doing well without any symptoms of chest pain, palpitations, syncope, shortness of breath.  He remains active.  Per cardiology note 11/03/2022, most recent echo in August 2023 showed stable mild to moderate aortic stenosis; peak gradient 24 mmHg and mean gradient 13 mmHg (previous 23 and 14).  Recommended followup in 1 year with repeat echo.  History of poorly controlled IDDM 2.  Last A1c I am able to find in the [place] notes in care everywhere is 8.1 on 01/03/23.     Patient is on once weekly GLP-1 agonist Ozempic, currently on hold for procedure.  Follows with rheumatology for history of rheumatoid arthritis.  Maintained on Plaquenil.  He will need day of surgery labs and evaluation.   TTE 07/18/2022 (Care Everywhere): Interpretation Summary Ejection Fraction = >55%. There is mild left ventricular hypertrophy. Diastolic function indeterminate. The right ventricle is normal in size and function. The left atrial size is normal and the right atrial size is normal. Mild valvular aortic stenosis; peak 24 mmHg and mean 13 mmHg. There is mild mitral regurgitation. There was insufficient tricuspid regurgitation detected to calculate right  ventricular systolic pressure.    Zannie Cove [facility] Short Stay Center/Anesthesiology Phone [phone] 06/25/2023 10:23 AM

## 2023-06-25 NOTE — Progress Notes (Signed)
PCP - Lonie Peak, PA-C [facility]   Chest x-ray - n/a EKG - DOS Stress Test - n/a ECHO - 07/18/22 CE Cardiac Cath - n/a  ICD Pacemaker/Loop - n/a  Sleep Study -  n/a CPAP - none  Diabetes Type 2  Last weekly dose of Ozempic was on Thursday, 06/14/23.  Hold synjardy 72 hours prior to procedure.  Last dose was on 06/25/23.  Lantus - If CBG greater >220 mg/dL, take 1/2 dose of lantus   Novolog - do not take bedtime (qhs) dose.  On DOS, do not take unless CBG is > 220 mg/dL, then you can 1/2 dose.   If your blood sugar is less than 70 mg/dL, you will need to treat for low blood sugar: Treat a low blood sugar (less than 70 mg/dL) with  cup of clear juice (cranberry or apple), 4 glucose tablets, OR glucose gel. Recheck blood sugar in 15 minutes after treatment (to make sure it is greater than 70 mg/dL). If your blood sugar is not greater than 70 mg/dL on recheck, call [phone] for further instructions.  Aspirin Instructions: last dose was on 06/21/23.  ERAS: Clear liquids til 12 Noon DOS.  Anesthesia review: Yes  STOP now taking any Aspirin (unless otherwise instructed by your surgeon), Aleve, Naproxen, Voltaren, Ibuprofen, Motrin, Advil, Goody's, BC's, all herbal medications, fish oil, and all vitamins.   Coronavirus Screening Do you have any of the following symptoms:  Cough yes/no: No Fever (>100.21F)  yes/no: No Runny nose yes/no: No Sore throat yes/no: No Difficulty breathing/shortness of breath  yes/no: No  Have you traveled in the last 14 days and where? yes/no: No  Patient verbalized understanding of instructions that were given via phone.

## 2023-06-26 ENCOUNTER — Inpatient Hospital Stay (HOSPITAL_COMMUNITY): Payer: Medicare Other | Admitting: Physician Assistant

## 2023-06-26 ENCOUNTER — Inpatient Hospital Stay (HOSPITAL_COMMUNITY): Payer: Medicare Other

## 2023-06-26 ENCOUNTER — Other Ambulatory Visit: Payer: Self-pay

## 2023-06-26 ENCOUNTER — Observation Stay (HOSPITAL_COMMUNITY)
Admission: RE | Admit: 2023-06-26 | Discharge: 2023-06-27 | Disposition: A | Discharge status: Home / Self Care | Payer: Medicare Other | Source: Ambulatory Visit | Attending: Neurosurgery | Admitting: Neurosurgery

## 2023-06-26 ENCOUNTER — Encounter (HOSPITAL_COMMUNITY): Payer: Self-pay | Admitting: Neurosurgery

## 2023-06-26 ENCOUNTER — Encounter (HOSPITAL_COMMUNITY)
Admission: RE | Disposition: A | Discharge status: Home / Self Care | Payer: Self-pay | Source: Ambulatory Visit | Attending: Neurosurgery

## 2023-06-26 DIAGNOSIS — Z794 Long term (current) use of insulin: Secondary | ICD-10-CM | POA: Insufficient documentation

## 2023-06-26 DIAGNOSIS — E119 Type 2 diabetes mellitus without complications: Secondary | ICD-10-CM | POA: Insufficient documentation

## 2023-06-26 DIAGNOSIS — M4802 Spinal stenosis, cervical region: Secondary | ICD-10-CM | POA: Diagnosis not present

## 2023-06-26 DIAGNOSIS — Z87891 Personal history of nicotine dependence: Secondary | ICD-10-CM | POA: Insufficient documentation

## 2023-06-26 DIAGNOSIS — Z7982 Long term (current) use of aspirin: Secondary | ICD-10-CM | POA: Diagnosis not present

## 2023-06-26 DIAGNOSIS — E1165 Type 2 diabetes mellitus with hyperglycemia: Secondary | ICD-10-CM | POA: Diagnosis not present

## 2023-06-26 DIAGNOSIS — M5001 Cervical disc disorder with myelopathy,  high cervical region: Secondary | ICD-10-CM | POA: Diagnosis not present

## 2023-06-26 DIAGNOSIS — I10 Essential (primary) hypertension: Secondary | ICD-10-CM

## 2023-06-26 DIAGNOSIS — M2578 Osteophyte, vertebrae: Secondary | ICD-10-CM | POA: Diagnosis not present

## 2023-06-26 DIAGNOSIS — Z4682 Encounter for fitting and adjustment of non-vascular catheter: Secondary | ICD-10-CM | POA: Diagnosis not present

## 2023-06-26 DIAGNOSIS — Z96653 Presence of artificial knee joint, bilateral: Secondary | ICD-10-CM | POA: Diagnosis not present

## 2023-06-26 DIAGNOSIS — M50021 Cervical disc disorder at C4-C5 level with myelopathy: Secondary | ICD-10-CM | POA: Diagnosis not present

## 2023-06-26 DIAGNOSIS — Z79899 Other long term (current) drug therapy: Secondary | ICD-10-CM | POA: Diagnosis not present

## 2023-06-26 DIAGNOSIS — E039 Hypothyroidism, unspecified: Secondary | ICD-10-CM | POA: Insufficient documentation

## 2023-06-26 DIAGNOSIS — Z981 Arthrodesis status: Secondary | ICD-10-CM | POA: Diagnosis not present

## 2023-06-26 HISTORY — PX: ANTERIOR CERVICAL DECOMP/DISCECTOMY FUSION: SHX1161

## 2023-06-26 HISTORY — DX: Respiratory tuberculosis unspecified: A15.9

## 2023-06-26 HISTORY — DX: Cardiac murmur, unspecified: R01.1

## 2023-06-26 LAB — BASIC METABOLIC PANEL
Anion gap: 24 — ABNORMAL HIGH (ref 5–15)
BUN: 22 mg/dL (ref 8–23)
CO2: 19 mmol/L — ABNORMAL LOW (ref 22–32)
Calcium: 9.4 mg/dL (ref 8.9–10.3)
Chloride: 88 mmol/L — ABNORMAL LOW (ref 98–111)
Creatinine, Ser: 1.03 mg/dL (ref 0.61–1.24)
GFR, Estimated: >60 mL/min (ref >60)
Glucose, Bld: 166 mg/dL — ABNORMAL HIGH (ref 70–99)
Potassium: 4.1 mmol/L (ref 3.5–5.1)
Sodium: 131 mmol/L — ABNORMAL LOW (ref 135–145)

## 2023-06-26 LAB — GLUCOSE, CAPILLARY
Glucose-Capillary: 160 mg/dL — ABNORMAL HIGH (ref 70–99)
Glucose-Capillary: 174 mg/dL — ABNORMAL HIGH (ref 70–99)
Glucose-Capillary: 160 mg/dL — ABNORMAL HIGH (ref 70–99)
Glucose-Capillary: 249 mg/dL — ABNORMAL HIGH (ref 70–99)

## 2023-06-26 LAB — SURGICAL PCR SCREEN
MRSA, PCR: NEGATIVE
Staphylococcus aureus: POSITIVE — AB

## 2023-06-26 LAB — HEMOGLOBIN A1C
Hgb A1c MFr Bld: 8.5 % — ABNORMAL HIGH (ref 4.8–5.6)
Mean Plasma Glucose: 197.25 mg/dL

## 2023-06-26 LAB — CBC
HCT: 41.3 % (ref 39.0–52.0)
Hemoglobin: 13.7 g/dL (ref 13.0–17.0)
MCH: 30 pg (ref 26.0–34.0)
MCHC: 33.2 g/dL (ref 30.0–36.0)
MCV: 90.6 fL (ref 80.0–100.0)
Platelets: 364 10*3/uL (ref 150–400)
RBC: 4.56 MIL/uL (ref 4.22–5.81)
RDW: 14.3 % (ref 11.5–15.5)
WBC: 14.1 10*3/uL — ABNORMAL HIGH (ref 4.0–10.5)
nRBC: 0 % (ref 0.0–0.2)

## 2023-06-26 SURGERY — ANTERIOR CERVICAL DECOMPRESSION/DISCECTOMY FUSION 2 LEVELS
Anesthesia: General | Site: Neck

## 2023-06-26 MED ORDER — ONDANSETRON HCL 4 MG/2ML IJ SOLN: 4.0000 mg | Freq: Once | INTRAMUSCULAR | Status: DC | PRN

## 2023-06-26 MED ORDER — LIDOCAINE 2% (20 MG/ML) 5 ML SYRINGE
INTRAMUSCULAR | Status: DC | PRN
  Administered 2023-06-26: 60 mg via INTRAVENOUS

## 2023-06-26 MED ORDER — THROMBIN 5000 UNITS EX SOLR: OROMUCOSAL | Status: DC | PRN

## 2023-06-26 MED ORDER — THROMBIN 5000 UNITS EX SOLR
CUTANEOUS | Status: AC
  Filled 2023-06-26: qty 15000

## 2023-06-26 MED ORDER — INSULIN GLARGINE-YFGN 100 UNIT/ML ~~LOC~~ SOLN
20.0000 [IU] | Freq: Every day | SUBCUTANEOUS | Status: DC
  Administered 2023-06-26: 20 [IU] via SUBCUTANEOUS
  Filled 2023-06-26 (×2): qty 0.2

## 2023-06-26 MED ORDER — ONDANSETRON HCL 4 MG/2ML IJ SOLN
INTRAMUSCULAR | Status: AC
  Filled 2023-06-26: qty 2

## 2023-06-26 MED ORDER — ESMOLOL HCL 100 MG/10ML IV SOLN
INTRAVENOUS | Status: AC
  Filled 2023-06-26: qty 10

## 2023-06-26 MED ORDER — PHENYLEPHRINE 80 MCG/ML (10ML) SYRINGE FOR IV PUSH (FOR BLOOD PRESSURE SUPPORT)
PREFILLED_SYRINGE | INTRAVENOUS | Status: AC
  Filled 2023-06-26: qty 10

## 2023-06-26 MED ORDER — INSULIN ASPART 100 UNIT/ML IJ SOLN
0.0000 [IU] | Freq: Three times a day (TID) | INTRAMUSCULAR | Status: DC
  Administered 2023-06-27: 9 [IU] via SUBCUTANEOUS

## 2023-06-26 MED ORDER — CHLORHEXIDINE GLUCONATE CLOTH 2 % EX PADS: 6.0000 | MEDICATED_PAD | Freq: Once | CUTANEOUS | Status: DC

## 2023-06-26 MED ORDER — ALBUMIN HUMAN 5 % IV SOLN: INTRAVENOUS | Status: DC | PRN

## 2023-06-26 MED ORDER — INSULIN ASPART 100 UNIT/ML IJ SOLN
0.0000 [IU] | Freq: Every day | INTRAMUSCULAR | Status: DC
  Administered 2023-06-26: 2 [IU] via SUBCUTANEOUS

## 2023-06-26 MED ORDER — HEMOSTATIC AGENTS (NO CHARGE) OPTIME
TOPICAL | Status: DC | PRN
  Administered 2023-06-26: 1 via TOPICAL

## 2023-06-26 MED ORDER — FENTANYL CITRATE (PF) 250 MCG/5ML IJ SOLN
INTRAMUSCULAR | Status: AC
  Filled 2023-06-26: qty 5

## 2023-06-26 MED ORDER — SODIUM CHLORIDE 0.9% FLUSH: 3.0000 mL | Freq: Two times a day (BID) | INTRAVENOUS | Status: DC

## 2023-06-26 MED ORDER — VITAMIN B-6 100 MG PO TABS
100.0000 mg | ORAL_TABLET | Freq: Every day | ORAL | Status: DC
  Filled 2023-06-26: qty 1

## 2023-06-26 MED ORDER — INSULIN ASPART 100 UNIT/ML IJ SOLN: 0.0000 [IU] | Freq: Three times a day (TID) | INTRAMUSCULAR | Status: DC

## 2023-06-26 MED ORDER — ACETAMINOPHEN 10 MG/ML IV SOLN
INTRAVENOUS | Status: AC
  Filled 2023-06-26: qty 100

## 2023-06-26 MED ORDER — PROPOFOL 10 MG/ML IV BOLUS
INTRAVENOUS | Status: AC
  Filled 2023-06-26: qty 20

## 2023-06-26 MED ORDER — LABETALOL HCL 5 MG/ML IV SOLN
INTRAVENOUS | Status: AC
  Filled 2023-06-26: qty 4

## 2023-06-26 MED ORDER — ACETAMINOPHEN 10 MG/ML IV SOLN
1000.0000 mg | Freq: Once | INTRAVENOUS | Status: DC | PRN
  Administered 2023-06-26: 1000 mg via INTRAVENOUS

## 2023-06-26 MED ORDER — CHLORHEXIDINE GLUCONATE 0.12 % MT SOLN: 15.0000 mL | Freq: Once | OROMUCOSAL | Status: AC

## 2023-06-26 MED ORDER — EMPAGLIFLOZIN-METFORMIN HCL ER 5-1000 MG PO TB24: 2.0000 | ORAL_TABLET | Freq: Every morning | ORAL | Status: DC

## 2023-06-26 MED ORDER — SODIUM CHLORIDE 0.9 % IV SOLN
250.0000 mL | INTRAVENOUS | Status: DC
  Administered 2023-06-26: 250 mL via INTRAVENOUS

## 2023-06-26 MED ORDER — LISINOPRIL 20 MG PO TABS
20.0000 mg | ORAL_TABLET | Freq: Every day | ORAL | Status: DC
  Filled 2023-06-26: qty 1

## 2023-06-26 MED ORDER — HYDROCODONE-ACETAMINOPHEN 10-325 MG PO TABS: 2.0000 | ORAL_TABLET | ORAL | Status: DC | PRN

## 2023-06-26 MED ORDER — FINASTERIDE 5 MG PO TABS
5.0000 mg | ORAL_TABLET | Freq: Every evening | ORAL | Status: DC
  Administered 2023-06-26: 5 mg via ORAL
  Filled 2023-06-26: qty 1

## 2023-06-26 MED ORDER — CHLORHEXIDINE GLUCONATE 0.12 % MT SOLN
OROMUCOSAL | Status: AC
  Administered 2023-06-26: 15 mL via OROMUCOSAL
  Filled 2023-06-26: qty 15

## 2023-06-26 MED ORDER — ADULT MULTIVITAMIN LIQUID CH
Freq: Every day | ORAL | Status: DC
  Filled 2023-06-26: qty 15

## 2023-06-26 MED ORDER — LIDOCAINE 2% (20 MG/ML) 5 ML SYRINGE
INTRAMUSCULAR | Status: AC
  Filled 2023-06-26: qty 5

## 2023-06-26 MED ORDER — DEXAMETHASONE SODIUM PHOSPHATE 10 MG/ML IJ SOLN
INTRAMUSCULAR | Status: DC | PRN
  Administered 2023-06-26: 10 mg via INTRAVENOUS

## 2023-06-26 MED ORDER — CYCLOBENZAPRINE HCL 10 MG PO TABS: 10.0000 mg | ORAL_TABLET | Freq: Three times a day (TID) | ORAL | Status: DC | PRN

## 2023-06-26 MED ORDER — PREGABALIN 75 MG PO CAPS
225.0000 mg | ORAL_CAPSULE | Freq: Two times a day (BID) | ORAL | Status: DC
  Administered 2023-06-26: 225 mg via ORAL
  Filled 2023-06-26 (×2): qty 3

## 2023-06-26 MED ORDER — ORAL CARE MOUTH RINSE: 15.0000 mL | Freq: Once | OROMUCOSAL | Status: AC

## 2023-06-26 MED ORDER — VANCOMYCIN HCL IN DEXTROSE 1-5 GM/200ML-% IV SOLN: 1000.0000 mg | INTRAVENOUS | Status: AC

## 2023-06-26 MED ORDER — SUCCINYLCHOLINE CHLORIDE 200 MG/10ML IV SOSY
PREFILLED_SYRINGE | INTRAVENOUS | Status: AC
  Filled 2023-06-26: qty 10

## 2023-06-26 MED ORDER — HYDROXYCHLOROQUINE SULFATE 200 MG PO TABS
200.0000 mg | ORAL_TABLET | Freq: Two times a day (BID) | ORAL | Status: DC
  Administered 2023-06-26: 200 mg via ORAL
  Filled 2023-06-26 (×3): qty 1

## 2023-06-26 MED ORDER — INSULIN ASPART 100 UNIT/ML IJ SOLN: 0.0000 [IU] | INTRAMUSCULAR | Status: DC | PRN

## 2023-06-26 MED ORDER — HYDROMORPHONE HCL 1 MG/ML IJ SOLN: 1.0000 mg | INTRAMUSCULAR | Status: DC | PRN

## 2023-06-26 MED ORDER — LEVOTHYROXINE SODIUM 88 MCG PO TABS
88.0000 ug | ORAL_TABLET | Freq: Every day | ORAL | Status: DC
  Administered 2023-06-27: 88 ug via ORAL
  Filled 2023-06-26: qty 1

## 2023-06-26 MED ORDER — SODIUM CHLORIDE 0.9% FLUSH: 3.0000 mL | INTRAVENOUS | Status: DC | PRN

## 2023-06-26 MED ORDER — 0.9 % SODIUM CHLORIDE (POUR BTL) OPTIME
TOPICAL | Status: DC | PRN
  Administered 2023-06-26: 1000 mL

## 2023-06-26 MED ORDER — THROMBIN 5000 UNITS EX SOLR
CUTANEOUS | Status: DC | PRN
  Administered 2023-06-26 (×2): 5000 [IU] via TOPICAL

## 2023-06-26 MED ORDER — SIMVASTATIN 20 MG PO TABS
20.0000 mg | ORAL_TABLET | Freq: Every evening | ORAL | Status: DC
  Administered 2023-06-26: 20 mg via ORAL
  Filled 2023-06-26: qty 1

## 2023-06-26 MED ORDER — FENTANYL CITRATE (PF) 100 MCG/2ML IJ SOLN: 25.0000 ug | INTRAMUSCULAR | Status: DC | PRN

## 2023-06-26 MED ORDER — INSULIN ASPART 100 UNIT/ML FLEXPEN: 10.0000 [IU] | PEN_INJECTOR | Freq: Three times a day (TID) | SUBCUTANEOUS | Status: DC | PRN

## 2023-06-26 MED ORDER — MENTHOL 3 MG MT LOZG: 1.0000 | LOZENGE | OROMUCOSAL | Status: DC | PRN

## 2023-06-26 MED ORDER — TAMSULOSIN HCL 0.4 MG PO CAPS
0.4000 mg | ORAL_CAPSULE | Freq: Every day | ORAL | Status: DC
  Filled 2023-06-26: qty 1

## 2023-06-26 MED ORDER — METFORMIN HCL ER 500 MG PO TB24
2000.0000 mg | ORAL_TABLET | Freq: Every day | ORAL | Status: DC
  Filled 2023-06-26: qty 4

## 2023-06-26 MED ORDER — FENTANYL CITRATE (PF) 250 MCG/5ML IJ SOLN
INTRAMUSCULAR | Status: DC | PRN
  Administered 2023-06-26: 50 ug via INTRAVENOUS
  Administered 2023-06-26: 100 ug via INTRAVENOUS
  Administered 2023-06-26: 50 ug via INTRAVENOUS

## 2023-06-26 MED ORDER — HYDROCODONE-ACETAMINOPHEN 5-325 MG PO TABS: 1.0000 | ORAL_TABLET | ORAL | Status: DC | PRN

## 2023-06-26 MED ORDER — DEXAMETHASONE SODIUM PHOSPHATE 10 MG/ML IJ SOLN
INTRAMUSCULAR | Status: AC
  Filled 2023-06-26: qty 1

## 2023-06-26 MED ORDER — ONDANSETRON HCL 4 MG/2ML IJ SOLN: 4.0000 mg | Freq: Four times a day (QID) | INTRAMUSCULAR | Status: DC | PRN

## 2023-06-26 MED ORDER — MUPIROCIN 2 % EX OINT
1.0000 | TOPICAL_OINTMENT | Freq: Two times a day (BID) | CUTANEOUS | Status: DC
  Administered 2023-06-26 – 2023-06-27 (×2): 1 via NASAL
  Filled 2023-06-26: qty 22

## 2023-06-26 MED ORDER — MAGNESIUM CITRATE PO SOLN: 0.5000 | Freq: Two times a day (BID) | ORAL | Status: DC

## 2023-06-26 MED ORDER — AMISULPRIDE (ANTIEMETIC) 5 MG/2ML IV SOLN: 10.0000 mg | Freq: Once | INTRAVENOUS | Status: DC | PRN

## 2023-06-26 MED ORDER — ONDANSETRON HCL 4 MG/2ML IJ SOLN
INTRAMUSCULAR | Status: DC | PRN
Start: 2023-06-26 — End: 2023-06-26
  Administered 2023-06-26: 4 mg via INTRAVENOUS

## 2023-06-26 MED ORDER — ACETAMINOPHEN 325 MG PO TABS: 650.0000 mg | ORAL_TABLET | ORAL | Status: DC | PRN

## 2023-06-26 MED ORDER — DOCUSATE SODIUM 100 MG PO CAPS
100.0000 mg | ORAL_CAPSULE | Freq: Two times a day (BID) | ORAL | Status: DC
  Administered 2023-06-26: 100 mg via ORAL
  Filled 2023-06-26 (×2): qty 1

## 2023-06-26 MED ORDER — FAMOTIDINE 20 MG PO TABS
40.0000 mg | ORAL_TABLET | Freq: Two times a day (BID) | ORAL | Status: DC
  Administered 2023-06-26: 40 mg via ORAL
  Filled 2023-06-26 (×2): qty 2

## 2023-06-26 MED ORDER — PHENOL 1.4 % MT LIQD: 1.0000 | OROMUCOSAL | Status: DC | PRN

## 2023-06-26 MED ORDER — CEFAZOLIN SODIUM-DEXTROSE 1-4 GM/50ML-% IV SOLN
1.0000 g | Freq: Three times a day (TID) | INTRAVENOUS | Status: AC
  Administered 2023-06-26 – 2023-06-27 (×2): 1 g via INTRAVENOUS
  Filled 2023-06-26 (×2): qty 50

## 2023-06-26 MED ORDER — ESMOLOL HCL 100 MG/10ML IV SOLN
INTRAVENOUS | Status: DC | PRN
  Administered 2023-06-26: 40 mg via INTRAVENOUS
  Administered 2023-06-26: 20 mg via INTRAVENOUS
  Administered 2023-06-26: 30 mg via INTRAVENOUS

## 2023-06-26 MED ORDER — FUROSEMIDE 20 MG PO TABS
20.0000 mg | ORAL_TABLET | Freq: Every day | ORAL | Status: DC
  Administered 2023-06-26: 20 mg via ORAL
  Filled 2023-06-26 (×2): qty 1

## 2023-06-26 MED ORDER — PROPOFOL 10 MG/ML IV BOLUS
INTRAVENOUS | Status: DC | PRN
Start: 2023-06-26 — End: 2023-06-26
  Administered 2023-06-26: 200 mg via INTRAVENOUS

## 2023-06-26 MED ORDER — LACTATED RINGERS IV SOLN: INTRAVENOUS | Status: DC

## 2023-06-26 MED ORDER — ROCURONIUM BROMIDE 10 MG/ML (PF) SYRINGE
PREFILLED_SYRINGE | INTRAVENOUS | Status: AC
  Filled 2023-06-26: qty 10

## 2023-06-26 MED ORDER — ROCURONIUM BROMIDE 10 MG/ML (PF) SYRINGE
PREFILLED_SYRINGE | INTRAVENOUS | Status: DC | PRN
  Administered 2023-06-26: 60 mg via INTRAVENOUS

## 2023-06-26 MED ORDER — ESMOLOL HCL 100 MG/10ML IV SOLN
INTRAVENOUS | Status: AC
  Filled 2023-06-26: qty 20

## 2023-06-26 MED ORDER — EMPAGLIFLOZIN 10 MG PO TABS
10.0000 mg | ORAL_TABLET | Freq: Every day | ORAL | Status: DC
  Filled 2023-06-26: qty 1

## 2023-06-26 MED ORDER — VANCOMYCIN HCL IN DEXTROSE 1-5 GM/200ML-% IV SOLN
INTRAVENOUS | Status: AC
  Administered 2023-06-26: 1000 mg via INTRAVENOUS
  Filled 2023-06-26: qty 200

## 2023-06-26 MED ORDER — ONDANSETRON HCL 4 MG PO TABS: 4.0000 mg | ORAL_TABLET | Freq: Four times a day (QID) | ORAL | Status: DC | PRN

## 2023-06-26 MED ORDER — SUGAMMADEX SODIUM 200 MG/2ML IV SOLN
INTRAVENOUS | Status: DC | PRN
  Administered 2023-06-26: 140.6 mg via INTRAVENOUS

## 2023-06-26 MED ORDER — ACETAMINOPHEN 650 MG RE SUPP: 650.0000 mg | RECTAL | Status: DC | PRN

## 2023-06-26 SURGICAL SUPPLY — 53 items
APL SKNCLS STERI-STRIP NONHPOA (GAUZE/BANDAGES/DRESSINGS) ×2
BAG COUNTER SPONGE SURGICOUNT (BAG) ×1 IMPLANT
BAG DECANTER FOR FLEXI CONT (MISCELLANEOUS) ×1 IMPLANT
BAG SPNG CNTER NS LX DISP (BAG) ×1
BENZOIN TINCTURE PRP APPL 2/3 (GAUZE/BANDAGES/DRESSINGS) ×1 IMPLANT
BIT DRILL 13 (BIT) IMPLANT
BUR MATCHSTICK NEURO 3.0 LAGG (BURR) ×1 IMPLANT
CAGE PEEK 7X14X11 (Cage) ×1 IMPLANT
CAGE PEEK 8X14X11 (Cage) IMPLANT
CANISTER SUCT 3000ML PPV (MISCELLANEOUS) ×1 IMPLANT
DRAPE C-ARM 42X72 X-RAY (DRAPES) ×2 IMPLANT
DRAPE LAPAROTOMY 100X72 PEDS (DRAPES) ×1 IMPLANT
DRAPE MICROSCOPE SLANT 54X150 (MISCELLANEOUS) ×1 IMPLANT
DURAPREP 6ML APPLICATOR 50/CS (WOUND CARE) ×1 IMPLANT
ELECT COATED BLADE 2.86 ST (ELECTRODE) ×1 IMPLANT
ELECT REM PT RETURN 9FT ADLT (ELECTROSURGICAL) ×1
ELECTRODE REM PT RTRN 9FT ADLT (ELECTROSURGICAL) ×1 IMPLANT
GAUZE 4X4 16PLY ~~LOC~~+RFID DBL (SPONGE) IMPLANT
GAUZE SPONGE 4X4 12PLY STRL (GAUZE/BANDAGES/DRESSINGS) ×1 IMPLANT
GLOVE ECLIPSE 9.0 STRL (GLOVE) ×1 IMPLANT
GLOVE EXAM NITRILE XL STR (GLOVE) IMPLANT
GOWN STRL REUS W/ TWL LRG LVL3 (GOWN DISPOSABLE) IMPLANT
GOWN STRL REUS W/ TWL XL LVL3 (GOWN DISPOSABLE) ×1 IMPLANT
GOWN STRL REUS W/TWL 2XL LVL3 (GOWN DISPOSABLE) IMPLANT
GOWN STRL REUS W/TWL LRG LVL3 (GOWN DISPOSABLE)
GOWN STRL REUS W/TWL XL LVL3 (GOWN DISPOSABLE) ×1
HALTER HD/CHIN CERV TRACTION D (MISCELLANEOUS) ×1 IMPLANT
HEMOSTAT POWDER KIT SURGIFOAM (HEMOSTASIS) IMPLANT
HEMOSTAT SURGICEL 2X14 (HEMOSTASIS) IMPLANT
KIT BASIN OR (CUSTOM PROCEDURE TRAY) ×1 IMPLANT
KIT TURNOVER KIT B (KITS) ×1 IMPLANT
NDL SPNL 20GX3.5 QUINCKE YW (NEEDLE) ×1 IMPLANT
NEEDLE SPNL 20GX3.5 QUINCKE YW (NEEDLE) ×1 IMPLANT
NS IRRIG 1000ML POUR BTL (IV SOLUTION) ×1 IMPLANT
PACK LAMINECTOMY NEURO (CUSTOM PROCEDURE TRAY) ×1 IMPLANT
PAD ARMBOARD 7.5X6 YLW CONV (MISCELLANEOUS) ×3 IMPLANT
PLATE 45MM (Plate) ×1 IMPLANT
PLATE 45XATL VS ELT (Plate) IMPLANT
SCREW ST 13X4XST VA NS SPNE (Screw) IMPLANT
SCREW ST VAR 4 ATL (Screw) ×6 IMPLANT
SPACER SPNL 11X14X7XPEEK CVD (Cage) IMPLANT
SPCR SPNL 11X14X7XPEEK CVD (Cage) ×1 IMPLANT
SPONGE INTESTINAL PEANUT (DISPOSABLE) ×1 IMPLANT
SPONGE SURGIFOAM ABS GEL SZ50 (HEMOSTASIS) ×1 IMPLANT
STRIP CLOSURE SKIN 1/2X4 (GAUZE/BANDAGES/DRESSINGS) ×1 IMPLANT
SUT VIC AB 3-0 SH 8-18 (SUTURE) ×1 IMPLANT
SUT VIC AB 4-0 RB1 18 (SUTURE) ×1 IMPLANT
TAPE CLOTH 4X10 WHT NS (GAUZE/BANDAGES/DRESSINGS) ×1 IMPLANT
TAPE CLOTH SURG 4X10 WHT LF (GAUZE/BANDAGES/DRESSINGS) IMPLANT
TOWEL GREEN STERILE (TOWEL DISPOSABLE) ×1 IMPLANT
TOWEL GREEN STERILE FF (TOWEL DISPOSABLE) ×1 IMPLANT
TRAP SPECIMEN MUCUS 40CC (MISCELLANEOUS) ×1 IMPLANT
WATER STERILE IRR 1000ML POUR (IV SOLUTION) ×1 IMPLANT

## 2023-06-26 NOTE — Op Note (Signed)
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  Asst. Surgeon: Conchita Paris  Anesthesia: General  Indication: 82 year old male with progressive bilateral upper extremity numbness pain and weakness with progressive gait instability.  Workup demonstrates evidence of cervical disc herniation with stenosis with early cord signal abnormality at C4-5 and significant stenosis at C3-4.  Patient presents now for two-level anterior cervical decompression and fusion in hopes of improving his symptoms.  Operative note: After induction of anesthesia, patient positioned supine with neck slightly extended and held placeholder traction.  Patient's anterior cervical region prepped and draped sterilely.  Incision made overlying C4.  Dissection performed on the right.  Retractor placed.  Fluoroscopy used.  Levels confirmed.  Disc base was incised.  Discectomy was performed using various instruments down to the level of the posterior annulus.  Microscope brought into the field used throughout the remainder of the discectomy.  Remaining aspects of annulus and osteophytes were removed using high-speed drill down to the level of the posterior longitudinal ligament.  Posterior logical was elevated and resected in a piecemeal fashion.  Underlying thecal sac was identified.  Wide central decompression then performed undercutting the bodies of C3 and C4.  Decompression then proceeded into each neural foramina.  Wide anterior foraminotomies performed on the course exiting nerve roots.  At this point a very thorough decompression of been achieved.  There was no evidence of injury to the thecal sac and nerve roots.   Gelfoam was placed topically for hemostasis.  Procedure then repeated at C4-5 again without complications.  Wound was irrigated.  Gelfoam was removed.  [employer] anatomic peek cages were then packed with locally harvested autograft.  Each cage was then impacted into place and recessed slightly from the anterior cortical margin.  Atlantis anterior cervical plate was then placed over the C3, C4 and C5 levels.  This then attached under fluoroscopic guidance using 13 mm variable angle screws to each at all 3 levels.  All screws given final tightening.  Locking screws engaged.  Final images revealed good position cages and the hardware at the proper operative level with normal alignment of the spine.  Wound was then irrigated.  Hemostasis was assured.  Wound was then closed in layers with Vicryl sutures.  Steri-Strips and sterile dressing were applied.  No apparent complications.  Patient tolerated the procedure well and he returns to the recovery room postop.
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
## 2023-06-26 NOTE — Transfer of Care (Signed)
Immediate Anesthesia Transfer of Care Note  Patient: David Mathews  Procedure(s) Performed: Anterior Cervical Discectomy Fusion Cervical three-four, Cervical four-five (Neck)  Patient Location: PACU  Anesthesia Type:General  Level of Consciousness: awake, alert , and oriented  Airway & Oxygen Therapy: Patient Spontanous Breathing  Post-op Assessment: Report given to RN and Post -op Vital signs reviewed and stable  Post vital signs: Reviewed and stable  Last Vitals:  Vitals Value Taken Time  BP 124/84   Temp 98   Pulse 98   Resp 16   SpO2 96     Last Pain:  Vitals:   06/26/23 1320  TempSrc:   PainSc: 2          Complications: No notable events documented.

## 2023-06-26 NOTE — Brief Op Note (Signed)
06/26/2023  4:16 PM  PATIENT:  David Mathews  82 y.o. male  PRE-OPERATIVE DIAGNOSIS:  Cervical spinal stenosis  POST-OPERATIVE DIAGNOSIS:  Cervical spinal stenosis  PROCEDURE:  Procedure(s) with comments: Anterior Cervical Discectomy Fusion Cervical three-four, Cervical four-five (N/A) - 3C  SURGEON:  Surgeons and Role:    * Julio Sicks, MD - Primary    * Lisbeth Renshaw, MD - Assisting  PHYSICIAN ASSISTANT:   ASSISTANTS:    ANESTHESIA:   general  EBL:  20 mL   BLOOD ADMINISTERED:none  DRAINS: none   LOCAL MEDICATIONS USED:  NONE  SPECIMEN:  No Specimen  DISPOSITION OF SPECIMEN:  N/A  COUNTS:  YES  TOURNIQUET:  * No tourniquets in log *  DICTATION: .Dragon Dictation  PLAN OF CARE: Admit for overnight observation  PATIENT DISPOSITION:  PACU - hemodynamically stable.   Delay start of Pharmacological VTE agent (>24hrs) due to surgical blood loss or risk of bleeding: yes

## 2023-06-26 NOTE — Anesthesia Postprocedure Evaluation (Signed)
Anesthesia Post Note  Patient: David Mathews  Procedure(s) Performed: Anterior Cervical Discectomy Fusion Cervical three-four, Cervical four-five (Neck)     Patient location during evaluation: PACU Anesthesia Type: General Level of consciousness: awake Pain management: pain level controlled Vital Signs Assessment: post-procedure vital signs reviewed and stable Respiratory status: spontaneous breathing, nonlabored ventilation and respiratory function stable Cardiovascular status: blood pressure returned to baseline and stable Postop Assessment: no apparent nausea or vomiting Anesthetic complications: no   No notable events documented.  Last Vitals:  Vitals:   06/26/23 1715 06/26/23 1734  BP: (!) 101/53 126/63  Pulse: (!) 103 98  Resp: 20 16  Temp: 36.9 C 36.9 C  SpO2: 97% 93%    Last Pain:  Vitals:   06/26/23 1734  TempSrc: Oral  PainSc:                   P 

## 2023-06-26 NOTE — Anesthesia Procedure Notes (Signed)
Procedure Name: Intubation Date/Time: 06/26/2023 2:25 PM  Performed by: Owens Loffler, RNPre-anesthesia Checklist: Patient identified, Emergency Drugs available, Suction available, Patient being monitored and Timeout performed Patient Re-evaluated:Patient Re-evaluated prior to induction Oxygen Delivery Method: Circle system utilized Preoxygenation: Pre-oxygenation with 100% oxygen Induction Type: IV induction Ventilation: Oral airway inserted - appropriate to patient size Laryngoscope Size: Glidescope and 4 Grade View: Grade I Tube type: Oral Tube size: 7.5 mm Number of attempts: 1 Airway Equipment and Method: Stylet and Video-laryngoscopy Placement Confirmation: ETT inserted through vocal cords under direct vision, positive ETCO2, CO2 detector and breath sounds checked- equal and bilateral Secured at: 22 cm Tube secured with: Tape Dental Injury: Teeth and Oropharynx as per pre-operative assessment

## 2023-06-26 NOTE — Addendum Note (Signed)
Addendum  created 06/26/23 1911 by Leonides Grills, MD   Intraprocedure Event edited, Intraprocedure Staff edited

## 2023-06-26 NOTE — Progress Notes (Signed)
Orthopedic Tech Progress Note Patient Details:  David Mathews 1941-03-11 [id number] Left collar at [facility] desk with RN Ortho Devices Type of Ortho Device: Soft collar Ortho Device/Splint Location: Neck Ortho Device/Splint Interventions: Ordered      [name] A  06/26/2023, 5:48 PM

## 2023-06-27 ENCOUNTER — Other Ambulatory Visit: Payer: Self-pay

## 2023-06-27 DIAGNOSIS — E039 Hypothyroidism, unspecified: Secondary | ICD-10-CM | POA: Diagnosis not present

## 2023-06-27 DIAGNOSIS — E119 Type 2 diabetes mellitus without complications: Secondary | ICD-10-CM | POA: Diagnosis not present

## 2023-06-27 DIAGNOSIS — M5001 Cervical disc disorder with myelopathy,  high cervical region: Secondary | ICD-10-CM | POA: Diagnosis not present

## 2023-06-27 DIAGNOSIS — I10 Essential (primary) hypertension: Secondary | ICD-10-CM | POA: Diagnosis not present

## 2023-06-27 DIAGNOSIS — M50021 Cervical disc disorder at C4-C5 level with myelopathy: Secondary | ICD-10-CM | POA: Diagnosis not present

## 2023-06-27 DIAGNOSIS — M4802 Spinal stenosis, cervical region: Secondary | ICD-10-CM | POA: Diagnosis not present

## 2023-06-27 LAB — GLUCOSE, CAPILLARY: Glucose-Capillary: 368 mg/dL — ABNORMAL HIGH (ref 70–99)

## 2023-06-27 NOTE — Discharge Instructions (Signed)

## 2023-06-27 NOTE — Discharge Summary (Signed)
Physician Discharge Summary  Patient ID: David Mathews MRN: [id number] DOB/AGE: [birth year] 82 y.o.  Admit date: 06/26/2023 Discharge date: 06/27/2023  Admission Diagnoses:  Discharge Diagnoses:  Principal Problem:   Cervical stenosis of spinal canal   Discharged Condition: good  Hospital Course: 82 year old male admitted to the hospital for two-level anterior cervical decompression and fusion for treatment of his compressive cervical myelopathy.  Patient underwent uncomplicated two-level anterior cervical surgery.  Postoperatively doing well.  Feels improvement in his balance strength and sensation.  Standing ambulating and voiding.  Ready for discharge home.  Consults:   Significant Diagnostic Studies:   Treatments:   Discharge Exam: Blood pressure 128/76, pulse 89, temperature (!) 97.5 F (36.4 C), temperature source Oral, resp. rate 16, height 5\' 9"  (1.753 m), weight 70.3 kg, SpO2 97%. Awake and alert.  Oriented and appropriate.  Motor and sensory function stable.  Wound clean and dry.  Chest and abdomen benign.  Disposition: Discharge disposition: 01-Home or Self Care        Allergies as of 06/27/2023       Reactions   Amoxicillin Rash, Other (See Comments)   Yeast infection Did it involve swelling of the face/tongue/throat, SOB, or low BP? No Did it involve sudden or severe rash/hives, skin peeling, or any reaction on the inside of your mouth or nose? No Did you need to seek medical attention at a hospital or doctor's office? No When did it last happen?      4 years If all above answers are "NO", may proceed with cephalosporin use.        Medication List     TAKE these medications    aspirin EC 81 MG tablet Take 81 mg by mouth daily. Swallow whole.   BIOFREEZE EX Apply 1 application topically daily as needed (pain shoulder).   CLEAR EYES OP Place 1 drop into both eyes daily as needed (irritation).   cyclobenzaprine 10 MG tablet Commonly known as:  FLEXERIL Take 1 tablet (10 mg total) by mouth 2 (two) times daily as needed for muscle spasms. What changed: when to take this   docusate sodium 100 MG capsule Commonly known as: Colace Take 1 capsule (100 mg total) by mouth 2 (two) times daily.   famotidine 40 MG tablet Commonly known as: PEPCID Take 40 mg by mouth 2 (two) times daily.   finasteride 5 MG tablet Commonly known as: PROSCAR Take 5 mg by mouth every evening.   furosemide 20 MG tablet Commonly known as: LASIX Take 20 mg by mouth daily. Additional 20 mg if needed during the day   HYDROcodone-acetaminophen 10-325 MG tablet Commonly known as: NORCO Take 1 tablet by mouth every 4 (four) hours as needed for moderate pain or severe pain.   insulin glargine 100 UNIT/ML Solostar Pen Commonly known as: Lantus SoloStar Inject 55 Units into the skin daily. Per sliding scale What changed:  how much to take when to take this additional instructions   levothyroxine 88 MCG tablet Commonly known as: SYNTHROID Take 88 mcg by mouth daily before breakfast.   lisinopril 20 MG tablet Commonly known as: ZESTRIL Take 20 mg by mouth daily.   MAGNESIUM CITRATE PO Take 400 mg by mouth 2 (two) times daily.   MULTIVITAMIN PO Take 1 tablet by mouth daily. 50 +   NovoLOG FlexPen 100 UNIT/ML FlexPen Generic drug: insulin aspart Inject 10-14 Units into the skin 3 (three) times daily as needed for high blood sugar. PER SLIDING SCALE  OZEMPIC (0.25 OR 0.5 MG/DOSE) [place] Inject 1 mg into the skin once a week. Thursday   Plaquenil 200 MG tablet Generic drug: hydroxychloroquine Take 200 mg by mouth 2 (two) times daily.   polyethylene glycol 17 g packet Commonly known as: MIRALAX / GLYCOLAX Take 17 g by mouth 2 (two) times daily. What changed:  when to take this reasons to take this   pregabalin 225 MG capsule Commonly known as: LYRICA Take 225 mg by mouth 2 (two) times daily.   pyridOXINE 100 MG tablet Commonly known as:  VITAMIN B6 Take 100 mg by mouth daily.   simvastatin 20 MG tablet Commonly known as: ZOCOR Take 20 mg by mouth every evening.   Synjardy XR 03-999 MG Tb24 Generic drug: Empagliflozin-metFORMIN HCl ER Take 2 tablets by mouth every morning.   tamsulosin 0.4 MG Caps capsule Commonly known as: FLOMAX Take 0.4 mg by mouth daily after breakfast.   Voltaren 1 % Gel Generic drug: diclofenac Sodium Apply 2 g topically daily as needed (Knee pain).   VOLTAREN PO Take 75 mg by mouth 2 (two) times daily.         SignedKathaleen Maser  06/27/2023, 9:39 AM

## 2023-06-27 NOTE — Progress Notes (Signed)
Surgical  site clean and dry. D/c instructions explain and given. Patient d/c home per order.

## 2023-06-27 NOTE — Plan of Care (Signed)
°  Problem: Education: Goal: Ability to verbalize activity precautions or restrictions will improve Outcome: Completed/Met Goal: Knowledge of the prescribed therapeutic regimen will improve Outcome: Completed/Met Goal: Understanding of discharge needs will improve Outcome: Completed/Met   Problem: Activity: Goal: Ability to avoid complications of mobility impairment will improve Outcome: Completed/Met Goal: Ability to tolerate increased activity will improve Outcome: Completed/Met Goal: Will remain free from falls Outcome: Completed/Met   Problem: Bowel/Gastric: Goal: Gastrointestinal status for postoperative course will improve Outcome: Completed/Met   Problem: Clinical Measurements: Goal: Ability to maintain clinical measurements within normal limits will improve Outcome: Completed/Met Goal: Postoperative complications will be avoided or minimized Outcome: Completed/Met Goal: Diagnostic test results will improve Outcome: Completed/Met   Problem: Pain Management: Goal: Pain level will decrease Outcome: Completed/Met   Problem: Skin Integrity: Goal: Will show signs of wound healing Outcome: Completed/Met   Problem: Health Behavior/Discharge Planning: Goal: Identification of resources available to assist in meeting health care needs will improve Outcome: Completed/Met   Problem: Bladder/Genitourinary: Goal: Urinary functional status for postoperative course will improve Outcome: Completed/Met

## 2023-06-27 NOTE — Care Management CC44 (Signed)
Condition Code 44 Documentation Completed  Patient Details  Name: David Mathews MRN: [id number] Date of Birth: 12-15-1940   Condition Code 44 given:  Yes Patient signature on Condition Code 44 notice:  Yes Documentation of 2 MD's agreement:  Yes Code 44 added to claim:  Yes    Lawerance Sabal, RN 06/27/2023, 10:18 AM

## 2023-06-27 NOTE — Care Management (Signed)
Patient with order to DC to home today. Unit staff to provide DME needed for home.   No HH needs identified Patient will have family/ friends provide transportation home. No other TOC needs identified for DC 

## 2023-06-27 NOTE — Care Management Obs Status (Signed)
MEDICARE OBSERVATION STATUS NOTIFICATION   Patient Details  Name: OMARI KINNARD MRN: [id number] Date of Birth: November 05, 1941   Medicare Observation Status Notification Given:  Yes    Lawerance Sabal, RN 06/27/2023, 10:18 AM

## 2023-06-27 NOTE — Evaluation (Signed)
Occupational Therapy Evaluation Patient Details Name: David Mathews MRN: [id number] DOB: 1941/10/04 Today's Date: 06/27/2023   History of Present Illness Pt is a 82 y/o male presenting on 8/6 for same day C3-5 ACDF.  PMH includes: DM2, HTN, RA, ulnar neuropathy, prior back surgery, prior carpal tunnel release bilaterally, B knee surgery.   Clinical Impression   Patient admitted for above and presents with problem list below.  PTA pt was independent with ADLs, IADLs and mobility using rollator as needed. Patient was educated on brace, cervical precautions, ADL compensatory techniques, AE/DME, mobility progression, safety and recommendations.  Today, pt demonstrated ability to complete bed mobility with independence, transfers using RW with supervision, functional mobility using RW with supervision, and ADLs with up to min guard assist at times due to unsteadiness standing with 0 hand support at sink.  Provided built up handles (red foam) and theraputty to assist with ADL ease, decreased drop rate and hand strengthening. At discharge, pt will have support from family as needed.  Based on performance today, benefit from further OT services acutely but anticipate no further needs after dc home.         If plan is discharge home, recommend the following: A little help with bathing/dressing/bathroom;Assist for transportation;Assistance with cooking/housework    Functional Status Assessment  Patient has had a recent decline in their functional status and demonstrates the ability to make significant improvements in function in a reasonable and predictable amount of time.  Equipment Recommendations       Recommendations for Other Services       Precautions / Restrictions Precautions Precautions: Fall;Cervical Precaution Booklet Issued: Yes (comment) Precaution Comments: reviewed with pt Required Braces or Orthoses: Cervical Brace Cervical Brace: Soft collar;At all times Restrictions Weight  Bearing Restrictions: No      Mobility Bed Mobility Overal bed mobility: Independent             General bed mobility comments: no assist required    Transfers Overall transfer level: Needs assistance Equipment used: Rolling walker (2 wheels) Transfers: Sit to/from Stand Sit to Stand: Supervision           General transfer comment: for safety      Balance Overall balance assessment: Mild deficits observed, not formally tested                                         ADL either performed or assessed with clinical judgement   ADL Overall ADL's : Needs assistance/impaired     Grooming: Supervision/safety;Wash/dry hands;Standing;Contact guard assist           Upper Body Dressing : Set up;Sitting   Lower Body Dressing: Supervision/safety;Sit to/from stand;Cueing for back precautions   Toilet Transfer: Supervision/safety;Ambulation;Rolling walker (2 wheels)           Functional mobility during ADLs: Supervision/safety;Rolling walker (2 wheels)       Vision   Vision Assessment?: No apparent visual deficits     Perception         Praxis         Pertinent Vitals/Pain Pain Assessment Pain Assessment: No/denies pain     Extremity/Trunk Assessment Upper Extremity Assessment Upper Extremity Assessment: Generalized weakness;Left hand dominate (hx of carpal tunnel release and decreased sensation, genearlized weakness)   Lower Extremity Assessment Lower Extremity Assessment: Defer to PT evaluation   Cervical / Trunk Assessment Cervical / Trunk Assessment:  Neck Surgery   Communication Communication Communication: No apparent difficulties   Cognition Arousal: Alert Behavior During Therapy: WFL for tasks assessed/performed Overall Cognitive Status: Within Functional Limits for tasks assessed                                 General Comments: pt verbose and requires redirection, but [facility]     General Comments   provided pt with red foam handles for ADL tasks (grooming, eating) due to decreased grasp, yellow putty to work on hand strength (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)    Exercises     Shoulder Instructions      Home Living Family/patient expects to be discharged to:: Private residence Living Arrangements: Children Available Help at Discharge: Family;Available PRN/intermittently Type of Home: House Home Access: Stairs to enter [employer] of Steps: 3 Entrance Stairs-Rails: Right;Left Home Layout: One level     Bathroom Shower/Tub: Producer, television/film/video: Handicapped height Bathroom Accessibility: Yes   Home Equipment: Rollator (4 wheels);Cane - single point;Shower seat - built in          Prior Functioning/Environment Prior Level of Function : Independent/Modified Independent             Mobility Comments: using rollator as needed for mobility ADLs Comments: independent and driving        OT Problem List: Decreased strength;Impaired balance (sitting and/or standing);Decreased coordination;Decreased knowledge of precautions;Impaired sensation      OT Treatment/Interventions: Self-care/ADL training;Neuromuscular education;DME and/or AE instruction;Therapeutic activities;Patient/family education;Balance training    OT Goals(Current goals can be found in the care plan section) Acute Rehab OT Goals Patient Stated Goal: home OT Goal Formulation: With patient Time For Goal Achievement: 07/11/23 Potential to Achieve Goals: Good  OT Frequency: Min 1X/week    Co-evaluation              AM-PAC OT "6 Clicks" Daily Activity     Outcome Measure Help from another person eating meals?: A Little Help from another person taking care of personal grooming?: A Little Help from another person toileting, which includes using toliet, bedpan, or urinal?: A Little Help from another person bathing (including washing, rinsing,  drying)?: A Little Help from another person to put on and taking off regular upper body clothing?: A Little Help from another person to put on and taking off regular lower body clothing?: A Little 6 Click Score: 18   End of Session Equipment Utilized During Treatment: Rolling walker (2 wheels);Cervical collar Nurse Communication: Mobility status  Activity Tolerance: Patient tolerated treatment well Patient left: in bed;with call bell/phone within reach  OT Visit Diagnosis: Other abnormalities of gait and mobility (R26.89);Muscle weakness (generalized) (M62.81)                Time: 2956-2130 OT Time Calculation (min): 29 min Charges:  OT General Charges $OT Visit: 1 Visit OT Evaluation $OT Eval Low Complexity: 1 Low OT Treatments $Self Care/Home Management : 8-22 mins  David Mathews, OT Acute Rehabilitation Services Office [phone]   Chancy Milroy 06/27/2023, 9:34 AM

## 2023-06-27 NOTE — Evaluation (Signed)
Physical Therapy Evaluation  Patient Details Name: David Mathews MRN: [id number] DOB: 1941-08-04 Today's Date: 06/27/2023  History of Present Illness  Pt is a 82 y/o male presenting on 8/6 for same day C3-5 ACDF.  PMH includes: DM2, HTN, RA, ulnar neuropathy, prior back surgery, prior carpal tunnel release bilaterally, B knee surgery.   Clinical Impression  Pt admitted with above diagnosis. At the time of PT eval, pt was able to demonstrate transfers and ambulation with gross CGA to supervision for safety and rollator for support. Pt was educated on precautions, brace application/wearing schedule, appropriate activity progression, and car transfer. Pt currently with functional limitations due to the deficits listed below (see PT Problem List). Pt will benefit from skilled PT to increase their independence and safety with mobility to allow discharge to the venue listed below.          If plan is discharge home, recommend the following: A little help with walking and/or transfers;A little help with bathing/dressing/bathroom;Assistance with cooking/housework;Assist for transportation;Help with stairs or ramp for entrance   Can travel by private vehicle        Equipment Recommendations None recommended by PT  Recommendations for Other Services       Functional Status Assessment Patient has had a recent decline in their functional status and demonstrates the ability to make significant improvements in function in a reasonable and predictable amount of time.     Precautions / Restrictions Precautions Precautions: Fall;Cervical Precaution Booklet Issued: Yes (comment) Precaution Comments: Reviewed handout with pt and he was cued for precautions during functional mobility. Required Braces or Orthoses: Cervical Brace Cervical Brace: Soft collar;At all times Restrictions Weight Bearing Restrictions: No      Mobility  Bed Mobility Overal bed mobility: Independent              General bed mobility comments: no assist required    Transfers Overall transfer level: Needs assistance Equipment used: Rollator (4 wheels) Transfers: Sit to/from Stand Sit to Stand: Supervision           General transfer comment: VC's for improved posture throughout transfers and hand placement on seated surface for safety.    Ambulation/Gait Ambulation/Gait assistance: Supervision, Contact guard assist Gait Distance (Feet): 500 Feet Assistive device: Rollator (4 wheels) Gait Pattern/deviations: Step-through pattern, Decreased stride length, Trunk flexed Gait velocity: Decreased Gait velocity interpretation: 1.31 - 2.62 ft/sec, indicative of limited community ambulator   General Gait Details: VC's for improved posture, closer walker proximity and forward gaze. No assist required but close guard progressing to supervision by end of gait training.  Stairs Stairs: Yes Stairs assistance: Contact guard assist Stair Management: One rail Left, Step to pattern, Forwards Number of Stairs: 3 General stair comments: VC's for sequencing and general safety.  Wheelchair Mobility     Tilt Bed    Modified Rankin (Stroke Patients Only)       Balance Overall balance assessment: Mild deficits observed, not formally tested                                           Pertinent Vitals/Pain Pain Assessment Pain Assessment: Faces Faces Pain Scale: No hurt    Home Living Family/patient expects to be discharged to:: Private residence Living Arrangements: Children Available Help at Discharge: Family;Available PRN/intermittently Type of Home: House Home Access: Stairs to enter Entrance Stairs-Rails: Right;Left Entrance Stairs-Number of Steps:  3   Home Layout: One level Home Equipment: Rollator (4 wheels);Cane - single point;Shower seat - built in      Prior Function Prior Level of Function : Independent/Modified Independent             Mobility  Comments: using rollator as needed for mobility ADLs Comments: independent and driving     Extremity/Trunk Assessment   Upper Extremity Assessment Upper Extremity Assessment: Defer to OT evaluation    Lower Extremity Assessment Lower Extremity Assessment: RLE deficits/detail RLE Deficits / Details: Foot drop noted. Pt reports he has an AFO at home but did not bring to the hospital. Baseline and present unchanged for years per pt report.    Cervical / Trunk Assessment Cervical / Trunk Assessment: Neck Surgery  Communication   Communication Communication: No apparent difficulties  Cognition Arousal: Alert Behavior During Therapy: WFL for tasks assessed/performed Overall Cognitive Status: Within Functional Limits for tasks assessed                                 General Comments: pt verbose and requires redirection, but [facility]        General Comments General comments (skin integrity, edema, etc.): provided pt with red foam handles for ADL tasks (grooming, eating) due to decreased grasp, yellow putty to work on hand strength (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)    Exercises     Assessment/Plan    PT Assessment Patient needs continued PT services  PT Problem List Decreased strength;Decreased activity tolerance;Decreased balance;Decreased mobility;Decreased knowledge of use of DME;Decreased knowledge of precautions;Decreased safety awareness;Pain       PT Treatment Interventions Gait training;DME instruction;Stair training;Functional mobility training;Therapeutic activities;Therapeutic exercise;Balance training;Patient/family education    PT Goals (Current goals can be found in the Care Plan section)  Acute Rehab PT Goals Patient Stated Goal: Home today, 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 PT Goal Formulation: With patient Time For Goal Achievement: 07/04/23 Potential to Achieve Goals: Good    Frequency Min 1X/week      Co-evaluation               AM-PAC PT "6 Clicks" Mobility  Outcome Measure Help needed turning from your back to your side while in a flat bed without using bedrails?: A Little Help needed moving from lying on your back to sitting on the side of a flat bed without using bedrails?: A Little Help needed moving to and from a bed to a chair (including a wheelchair)?: A Little Help needed standing up from a chair using your arms (e.g., wheelchair or bedside chair)?: A Little Help needed to walk in hospital room?: A Little Help needed climbing 3-5 steps with a railing? : A Little 6 Click Score: 18    End of Session Equipment Utilized During Treatment: Gait belt;Cervical collar Activity Tolerance: Patient tolerated treatment well Patient left: in bed;with call bell/phone within reach Nurse Communication: Mobility status PT Visit Diagnosis: Unsteadiness on feet (R26.81);Pain Pain - part of body:  (neck)    Time: 9147-8295 PT Time Calculation (min) (ACUTE ONLY): 21 min   Charges:   PT Evaluation $PT Eval Low Complexity: 1 Low   PT General Charges $$ ACUTE PT VISIT: 1 Visit         Conni Slipper, PT, DPT Acute Rehabilitation Services Secure Chat Preferred Office: [phone]   Marylynn Pearson 06/27/2023, 12:09 PM

## 2023-06-29 ENCOUNTER — Encounter (HOSPITAL_COMMUNITY): Payer: Self-pay | Admitting: Neurosurgery

## 2023-07-12 ENCOUNTER — Telehealth: Payer: Self-pay | Admitting: *Deleted

## 2023-07-12 NOTE — Progress Notes (Signed)
  Care Coordination Note  07/12/2023 Name: David Mathews MRN: [id number] DOB: March 04, 1941  David Mathews is a 82 y.o. year old male who is a primary care patient of [name] and is actively engaged with the care management team. I reached out to [name] by phone today to assist with re-scheduling a follow up visit with the RN Case Manager  Follow up plan: Unsuccessful telephone outreach attempt made. A HIPAA compliant phone message was left for the patient providing contact information and requesting a return call.   [facility]  Care Coordination Care Guide  Direct Dial: [phone]

## 2023-07-17 DIAGNOSIS — Z0000 Encounter for general adult medical examination without abnormal findings: Secondary | ICD-10-CM | POA: Diagnosis not present

## 2023-07-17 DIAGNOSIS — Z9181 History of falling: Secondary | ICD-10-CM | POA: Diagnosis not present

## 2023-07-17 DIAGNOSIS — Z139 Encounter for screening, unspecified: Secondary | ICD-10-CM | POA: Diagnosis not present

## 2023-07-17 DIAGNOSIS — Z1331 Encounter for screening for depression: Secondary | ICD-10-CM | POA: Diagnosis not present

## 2023-07-19 NOTE — Progress Notes (Signed)
  Care Coordination Note  07/19/2023 Name: David Mathews MRN: [id number] DOB: 1941-07-10  David Mathews is a 82 y.o. year old male who is a primary care patient of [name] and is actively engaged with the care management team. I reached out to [name] by phone today to assist with re-scheduling a follow up visit with the RN Case Manager  Follow up plan: Unsuccessful telephone outreach attempt made. A HIPAA compliant phone message was left for the patient providing contact information and requesting a return call.   [facility]  Care Coordination Care Guide  Direct Dial: [phone]

## 2023-07-19 NOTE — Progress Notes (Signed)
  Care Coordination Note  07/19/2023 Name: David Mathews MRN: [id number] DOB: Sep 06, 1941  David Mathews is a 82 y.o. year old male who is a primary care patient of [name] and is actively engaged with the care management team. I reached out to [name] by phone today to assist with re-scheduling a follow up visit with the RN Case Manager  Follow up plan: Telephone appointment with care management team member scheduled for:08/13/23  [facility] Coordination Care Guide  Direct Dial: [phone]

## 2023-08-13 ENCOUNTER — Ambulatory Visit: Payer: Self-pay

## 2023-08-13 NOTE — Patient Outreach (Signed)
Care Coordination   Follow Up Visit Note   08/13/2023 Name: JARRIS RUECKER MRN: [id number] DOB: 1941/06/07  David Mathews is a 82 y.o. year old male who sees Lonie Peak, [place] for primary care. I spoke with  [name] by phone today.  What matters to the patients health and wellness today?  Patient reports that he is doing well. Reports no pain since surgery. DM per patient is "ok" Reports that he stopped Ozempic, states that he wanted to gain some weight back.  Has a positive attitude.  Got new dentures. Patient reports that his balance is better after his recent surgery of C3-C5. Able to walk without wall surfing or using a cane.  1 fall since surgery ( slipped off stool while working on go cart) yesterday.  Denies injury. Next appointment with Lonie Peak on 08/29/2023.     Goals Addressed               This Visit's Progress     COMPLETED: I worry about my balance and posture (pt-stated)         Interventions Today    Flowsheet Row Most Recent Value  Chronic Disease   Chronic disease during today's visit Diabetes  [Recent C3-C5 surgery]  General Interventions   General Interventions Discussed/Reviewed General Interventions Discussed, Doctor Visits  Doctor Visits Discussed/Reviewed Doctor Visits Discussed, Annual Wellness Visits, PCP  PCP/Specialist Visits Compliance with follow-up visit  Exercise Interventions   Exercise Discussed/Reviewed Exercise Reviewed, Physical Activity  Education Interventions   Education Provided Provided Education  [Reviewed DM control and hyper and hypogylcemia.  Reviewed fall prevention and importance of safety.]  Provided Verbal Education On Nutrition, Medication, When to see the doctor  Mental Health Interventions   Mental Health Discussed/Reviewed Other  [provided a listening ear]  Nutrition Interventions   Nutrition Discussed/Reviewed Nutrition Discussed, Decreasing fats, Carbohydrate meal planning  Pharmacy Interventions    Pharmacy Dicussed/Reviewed Medications and their functions  Safety Interventions   Safety Discussed/Reviewed Fall Risk, Home Safety      Reviewed case closure.  Encouraged patient to call me if needed in the future. He agreed.         SDOH assessments and interventions completed:  No     Care Coordination Interventions:  Yes, provided   Follow up plan: No further intervention required.   Encounter Outcome:  Patient Visit Completed   Lonia Chimera, RN, BSN, CEN [facility] Coordinator [phone]

## 2023-08-23 DIAGNOSIS — M5412 Radiculopathy, cervical region: Secondary | ICD-10-CM | POA: Diagnosis not present

## 2023-08-29 DIAGNOSIS — M159 Polyosteoarthritis, unspecified: Secondary | ICD-10-CM | POA: Diagnosis not present

## 2023-08-29 DIAGNOSIS — E039 Hypothyroidism, unspecified: Secondary | ICD-10-CM | POA: Diagnosis not present

## 2023-08-29 DIAGNOSIS — N401 Enlarged prostate with lower urinary tract symptoms: Secondary | ICD-10-CM | POA: Diagnosis not present

## 2023-08-29 DIAGNOSIS — M25561 Pain in right knee: Secondary | ICD-10-CM | POA: Diagnosis not present

## 2023-08-29 DIAGNOSIS — R221 Localized swelling, mass and lump, neck: Secondary | ICD-10-CM | POA: Diagnosis not present

## 2023-08-29 DIAGNOSIS — M0600 Rheumatoid arthritis without rheumatoid factor, unspecified site: Secondary | ICD-10-CM | POA: Diagnosis not present

## 2023-08-29 DIAGNOSIS — I10 Essential (primary) hypertension: Secondary | ICD-10-CM | POA: Diagnosis not present

## 2023-08-29 DIAGNOSIS — Z23 Encounter for immunization: Secondary | ICD-10-CM | POA: Diagnosis not present

## 2023-08-29 DIAGNOSIS — Z96651 Presence of right artificial knee joint: Secondary | ICD-10-CM | POA: Diagnosis not present

## 2023-08-29 DIAGNOSIS — M545 Low back pain, unspecified: Secondary | ICD-10-CM | POA: Diagnosis not present

## 2023-08-29 DIAGNOSIS — R600 Localized edema: Secondary | ICD-10-CM | POA: Diagnosis not present

## 2023-08-29 DIAGNOSIS — K219 Gastro-esophageal reflux disease without esophagitis: Secondary | ICD-10-CM | POA: Diagnosis not present

## 2023-08-29 DIAGNOSIS — E1140 Type 2 diabetes mellitus with diabetic neuropathy, unspecified: Secondary | ICD-10-CM | POA: Diagnosis not present

## 2023-08-29 DIAGNOSIS — E7800 Pure hypercholesterolemia, unspecified: Secondary | ICD-10-CM | POA: Diagnosis not present

## 2023-10-04 DIAGNOSIS — M154 Erosive (osteo)arthritis: Secondary | ICD-10-CM | POA: Diagnosis not present

## 2023-10-04 DIAGNOSIS — R768 Other specified abnormal immunological findings in serum: Secondary | ICD-10-CM | POA: Diagnosis not present

## 2023-10-04 DIAGNOSIS — M150 Primary generalized (osteo)arthritis: Secondary | ICD-10-CM | POA: Diagnosis not present

## 2023-10-04 DIAGNOSIS — M5136 Other intervertebral disc degeneration, lumbar region with discogenic back pain only: Secondary | ICD-10-CM | POA: Diagnosis not present

## 2023-10-09 DIAGNOSIS — M154 Erosive (osteo)arthritis: Secondary | ICD-10-CM | POA: Diagnosis not present

## 2023-10-10 DIAGNOSIS — M5412 Radiculopathy, cervical region: Secondary | ICD-10-CM | POA: Diagnosis not present

## 2023-10-10 DIAGNOSIS — M4802 Spinal stenosis, cervical region: Secondary | ICD-10-CM | POA: Diagnosis not present

## 2023-12-04 DIAGNOSIS — E1140 Type 2 diabetes mellitus with diabetic neuropathy, unspecified: Secondary | ICD-10-CM | POA: Diagnosis not present

## 2023-12-04 DIAGNOSIS — I5020 Unspecified systolic (congestive) heart failure: Secondary | ICD-10-CM | POA: Diagnosis not present

## 2023-12-04 DIAGNOSIS — E039 Hypothyroidism, unspecified: Secondary | ICD-10-CM | POA: Diagnosis not present

## 2023-12-04 DIAGNOSIS — N401 Enlarged prostate with lower urinary tract symptoms: Secondary | ICD-10-CM | POA: Diagnosis not present

## 2023-12-04 DIAGNOSIS — E7800 Pure hypercholesterolemia, unspecified: Secondary | ICD-10-CM | POA: Diagnosis not present

## 2023-12-04 DIAGNOSIS — M545 Low back pain, unspecified: Secondary | ICD-10-CM | POA: Diagnosis not present

## 2023-12-04 DIAGNOSIS — K219 Gastro-esophageal reflux disease without esophagitis: Secondary | ICD-10-CM | POA: Diagnosis not present

## 2023-12-04 DIAGNOSIS — M159 Polyosteoarthritis, unspecified: Secondary | ICD-10-CM | POA: Diagnosis not present

## 2023-12-04 DIAGNOSIS — I10 Essential (primary) hypertension: Secondary | ICD-10-CM | POA: Diagnosis not present

## 2023-12-04 DIAGNOSIS — M0600 Rheumatoid arthritis without rheumatoid factor, unspecified site: Secondary | ICD-10-CM | POA: Diagnosis not present

## 2024-01-09 DIAGNOSIS — M4802 Spinal stenosis, cervical region: Secondary | ICD-10-CM | POA: Diagnosis not present

## 2024-03-12 DIAGNOSIS — H353131 Nonexudative age-related macular degeneration, bilateral, early dry stage: Secondary | ICD-10-CM | POA: Diagnosis not present

## 2024-03-12 DIAGNOSIS — H47323 Drusen of optic disc, bilateral: Secondary | ICD-10-CM | POA: Diagnosis not present

## 2024-03-12 DIAGNOSIS — E103291 Type 1 diabetes mellitus with mild nonproliferative diabetic retinopathy without macular edema, right eye: Secondary | ICD-10-CM | POA: Diagnosis not present

## 2024-05-07 DIAGNOSIS — M48061 Spinal stenosis, lumbar region without neurogenic claudication: Secondary | ICD-10-CM | POA: Diagnosis not present

## 2024-06-04 DIAGNOSIS — M159 Polyosteoarthritis, unspecified: Secondary | ICD-10-CM | POA: Diagnosis not present

## 2024-06-04 DIAGNOSIS — N401 Enlarged prostate with lower urinary tract symptoms: Secondary | ICD-10-CM | POA: Diagnosis not present

## 2024-06-04 DIAGNOSIS — I10 Essential (primary) hypertension: Secondary | ICD-10-CM | POA: Diagnosis not present

## 2024-06-04 DIAGNOSIS — I959 Hypotension, unspecified: Secondary | ICD-10-CM | POA: Diagnosis not present

## 2024-06-04 DIAGNOSIS — K219 Gastro-esophageal reflux disease without esophagitis: Secondary | ICD-10-CM | POA: Diagnosis not present

## 2024-06-04 DIAGNOSIS — M0600 Rheumatoid arthritis without rheumatoid factor, unspecified site: Secondary | ICD-10-CM | POA: Diagnosis not present

## 2024-06-04 DIAGNOSIS — I5020 Unspecified systolic (congestive) heart failure: Secondary | ICD-10-CM | POA: Diagnosis not present

## 2024-06-04 DIAGNOSIS — E039 Hypothyroidism, unspecified: Secondary | ICD-10-CM | POA: Diagnosis not present

## 2024-06-04 DIAGNOSIS — E1140 Type 2 diabetes mellitus with diabetic neuropathy, unspecified: Secondary | ICD-10-CM | POA: Diagnosis not present

## 2024-06-04 DIAGNOSIS — E7800 Pure hypercholesterolemia, unspecified: Secondary | ICD-10-CM | POA: Diagnosis not present

## 2024-06-04 DIAGNOSIS — M545 Low back pain, unspecified: Secondary | ICD-10-CM | POA: Diagnosis not present

## 2024-06-11 DIAGNOSIS — M461 Sacroiliitis, not elsewhere classified: Secondary | ICD-10-CM | POA: Diagnosis not present

## 2024-06-11 DIAGNOSIS — Z9889 Other specified postprocedural states: Secondary | ICD-10-CM | POA: Diagnosis not present

## 2024-06-13 DIAGNOSIS — I350 Nonrheumatic aortic (valve) stenosis: Secondary | ICD-10-CM | POA: Diagnosis not present

## 2024-06-13 DIAGNOSIS — E1140 Type 2 diabetes mellitus with diabetic neuropathy, unspecified: Secondary | ICD-10-CM | POA: Diagnosis not present

## 2024-06-13 DIAGNOSIS — I10 Essential (primary) hypertension: Secondary | ICD-10-CM | POA: Diagnosis not present

## 2024-06-13 DIAGNOSIS — I5020 Unspecified systolic (congestive) heart failure: Secondary | ICD-10-CM | POA: Diagnosis not present

## 2024-09-23 DIAGNOSIS — K219 Gastro-esophageal reflux disease without esophagitis: Secondary | ICD-10-CM | POA: Diagnosis not present
